# Patient Record
Sex: Female | Born: 1945
Health system: Southern US, Community
[De-identification: ages and names within clinical notes are randomized; demographics above are authoritative.]

## PROBLEM LIST (undated history)

## (undated) DIAGNOSIS — D123 Benign neoplasm of transverse colon: Secondary | ICD-10-CM

## (undated) DIAGNOSIS — I219 Acute myocardial infarction, unspecified: Secondary | ICD-10-CM

## (undated) DIAGNOSIS — D127 Benign neoplasm of rectosigmoid junction: Secondary | ICD-10-CM

## (undated) DIAGNOSIS — F17210 Nicotine dependence, cigarettes, uncomplicated: Secondary | ICD-10-CM

## (undated) DIAGNOSIS — Q600 Renal agenesis, unilateral: Secondary | ICD-10-CM

## (undated) DIAGNOSIS — J410 Simple chronic bronchitis: Secondary | ICD-10-CM

## (undated) DIAGNOSIS — J439 Emphysema, unspecified: Secondary | ICD-10-CM

## (undated) DIAGNOSIS — Z9221 Personal history of antineoplastic chemotherapy: Secondary | ICD-10-CM

## (undated) DIAGNOSIS — R42 Dizziness and giddiness: Secondary | ICD-10-CM

## (undated) DIAGNOSIS — M5136 Other intervertebral disc degeneration, lumbar region: Secondary | ICD-10-CM

## (undated) DIAGNOSIS — C801 Malignant (primary) neoplasm, unspecified: Secondary | ICD-10-CM

## (undated) DIAGNOSIS — C189 Malignant neoplasm of colon, unspecified: Secondary | ICD-10-CM

## (undated) DIAGNOSIS — G971 Other reaction to spinal and lumbar puncture: Secondary | ICD-10-CM

## (undated) DIAGNOSIS — J45909 Unspecified asthma, uncomplicated: Secondary | ICD-10-CM

## (undated) DIAGNOSIS — M51369 Other intervertebral disc degeneration, lumbar region without mention of lumbar back pain or lower extremity pain: Secondary | ICD-10-CM

## (undated) DIAGNOSIS — I10 Essential (primary) hypertension: Secondary | ICD-10-CM

## (undated) DIAGNOSIS — IMO0001 Reserved for inherently not codable concepts without codable children: Secondary | ICD-10-CM

## (undated) DIAGNOSIS — F40240 Claustrophobia: Secondary | ICD-10-CM

## (undated) DIAGNOSIS — J449 Chronic obstructive pulmonary disease, unspecified: Secondary | ICD-10-CM

## (undated) DIAGNOSIS — I2510 Atherosclerotic heart disease of native coronary artery without angina pectoris: Secondary | ICD-10-CM

## (undated) DIAGNOSIS — K625 Hemorrhage of anus and rectum: Secondary | ICD-10-CM

## (undated) DIAGNOSIS — M79603 Pain in arm, unspecified: Secondary | ICD-10-CM

## (undated) DIAGNOSIS — B359 Dermatophytosis, unspecified: Secondary | ICD-10-CM

## (undated) DIAGNOSIS — Z86718 Personal history of other venous thrombosis and embolism: Secondary | ICD-10-CM

## (undated) DIAGNOSIS — Z9189 Other specified personal risk factors, not elsewhere classified: Secondary | ICD-10-CM

## (undated) DIAGNOSIS — E785 Hyperlipidemia, unspecified: Secondary | ICD-10-CM

## (undated) DIAGNOSIS — K649 Unspecified hemorrhoids: Secondary | ICD-10-CM

## (undated) DIAGNOSIS — Z972 Presence of dental prosthetic device (complete) (partial): Secondary | ICD-10-CM

## (undated) DIAGNOSIS — I700 Atherosclerosis of aorta: Secondary | ICD-10-CM

## (undated) DIAGNOSIS — Z923 Personal history of irradiation: Secondary | ICD-10-CM

## (undated) HISTORY — DX: Hyperlipidemia, unspecified: E78.5

## (undated) HISTORY — DX: Atherosclerotic heart disease of native coronary artery without angina pectoris: I25.10

## (undated) HISTORY — DX: Unspecified hemorrhoids: K64.9

## (undated) HISTORY — DX: Other intervertebral disc degeneration, lumbar region without mention of lumbar back pain or lower extremity pain: M51.369

## (undated) HISTORY — DX: Benign neoplasm of transverse colon: D12.3

## (undated) HISTORY — DX: Essential (primary) hypertension: I10

## (undated) HISTORY — PX: VEIN LIGATION AND STRIPPING: SHX2653

## (undated) HISTORY — DX: Chronic obstructive pulmonary disease, unspecified: J44.9

## (undated) HISTORY — PX: LESION REMOVAL: SHX5196

## (undated) HISTORY — DX: Dermatophytosis, unspecified: B35.9

## (undated) HISTORY — PX: BREAST BIOPSY: SHX20

## (undated) HISTORY — DX: Nicotine dependence, cigarettes, uncomplicated: F17.210

## (undated) HISTORY — PX: OVARIAN CYST REMOVAL: SHX89

## (undated) HISTORY — DX: Other specified personal risk factors, not elsewhere classified: Z91.89

## (undated) HISTORY — DX: Other intervertebral disc degeneration, lumbar region: M51.36

## (undated) HISTORY — DX: Hemorrhage of anus and rectum: K62.5

## (undated) HISTORY — PX: EXPLORATORY LAPAROTOMY: SUR591

## (undated) HISTORY — PX: APPENDECTOMY: SHX54

## (undated) HISTORY — DX: Pain in arm, unspecified: M79.603

## (undated) HISTORY — DX: Claustrophobia: F40.240

## (undated) HISTORY — DX: Atherosclerosis of aorta: I70.0

## (undated) HISTORY — DX: Benign neoplasm of rectosigmoid junction: D12.7

## (undated) HISTORY — DX: Emphysema, unspecified: J43.9

---

## 1992-09-20 DIAGNOSIS — Z789 Other specified health status: Secondary | ICD-10-CM

## 1992-09-20 HISTORY — DX: Other specified health status: Z78.9

## 1995-09-21 HISTORY — PX: LUMBAR DISC SURGERY: SHX700

## 2002-02-04 ENCOUNTER — Emergency Department (HOSPITAL_COMMUNITY): Admission: EM | Admit: 2002-02-04 | Discharge: 2002-02-05 | Payer: Self-pay | Admitting: *Deleted

## 2006-05-23 ENCOUNTER — Emergency Department: Payer: Self-pay

## 2006-12-21 ENCOUNTER — Ambulatory Visit: Payer: Self-pay | Admitting: Internal Medicine

## 2007-04-28 ENCOUNTER — Other Ambulatory Visit: Payer: Self-pay

## 2007-04-28 ENCOUNTER — Emergency Department: Payer: Self-pay | Admitting: Emergency Medicine

## 2010-07-22 ENCOUNTER — Ambulatory Visit: Payer: Self-pay | Admitting: Internal Medicine

## 2010-07-30 ENCOUNTER — Ambulatory Visit: Payer: Self-pay | Admitting: Internal Medicine

## 2012-02-23 ENCOUNTER — Ambulatory Visit: Payer: Self-pay | Admitting: Internal Medicine

## 2014-08-20 LAB — HM MAMMOGRAPHY: HM MAMMO: NORMAL

## 2014-08-28 LAB — LIPID PANEL
CHOLESTEROL: 219 mg/dL — AB (ref 0–200)
HDL: 64 mg/dL (ref 35–70)
LDL Cholesterol: 128 mg/dL
Triglycerides: 116 mg/dL (ref 40–160)

## 2014-09-05 ENCOUNTER — Ambulatory Visit: Payer: Self-pay | Admitting: Internal Medicine

## 2014-12-30 ENCOUNTER — Encounter: Payer: Self-pay | Admitting: Internal Medicine

## 2014-12-30 DIAGNOSIS — I2510 Atherosclerotic heart disease of native coronary artery without angina pectoris: Secondary | ICD-10-CM | POA: Insufficient documentation

## 2014-12-30 DIAGNOSIS — M5136 Other intervertebral disc degeneration, lumbar region: Secondary | ICD-10-CM | POA: Insufficient documentation

## 2014-12-30 DIAGNOSIS — E785 Hyperlipidemia, unspecified: Secondary | ICD-10-CM | POA: Insufficient documentation

## 2014-12-30 DIAGNOSIS — Z79899 Other long term (current) drug therapy: Secondary | ICD-10-CM | POA: Insufficient documentation

## 2014-12-30 DIAGNOSIS — I700 Atherosclerosis of aorta: Secondary | ICD-10-CM | POA: Insufficient documentation

## 2014-12-30 DIAGNOSIS — I10 Essential (primary) hypertension: Secondary | ICD-10-CM | POA: Insufficient documentation

## 2014-12-30 DIAGNOSIS — B359 Dermatophytosis, unspecified: Secondary | ICD-10-CM | POA: Insufficient documentation

## 2014-12-30 DIAGNOSIS — J439 Emphysema, unspecified: Secondary | ICD-10-CM | POA: Insufficient documentation

## 2014-12-30 DIAGNOSIS — F17200 Nicotine dependence, unspecified, uncomplicated: Secondary | ICD-10-CM | POA: Insufficient documentation

## 2015-02-28 ENCOUNTER — Ambulatory Visit: Payer: Self-pay | Admitting: Internal Medicine

## 2015-02-28 ENCOUNTER — Ambulatory Visit (INDEPENDENT_AMBULATORY_CARE_PROVIDER_SITE_OTHER): Payer: Medicare PPO | Admitting: Internal Medicine

## 2015-02-28 ENCOUNTER — Encounter: Payer: Self-pay | Admitting: Internal Medicine

## 2015-02-28 VITALS — BP 140/86 | HR 96 | Ht 62.0 in | Wt 174.8 lb

## 2015-02-28 DIAGNOSIS — Z79899 Other long term (current) drug therapy: Secondary | ICD-10-CM | POA: Diagnosis not present

## 2015-02-28 DIAGNOSIS — E785 Hyperlipidemia, unspecified: Secondary | ICD-10-CM | POA: Diagnosis not present

## 2015-02-28 DIAGNOSIS — I10 Essential (primary) hypertension: Secondary | ICD-10-CM | POA: Diagnosis not present

## 2015-02-28 MED ORDER — AMLODIPINE BESYLATE 5 MG PO TABS: 5.0000 mg | ORAL_TABLET | Freq: Every day | ORAL | Status: DC

## 2015-02-28 MED ORDER — ATORVASTATIN CALCIUM 20 MG PO TABS: 20.0000 mg | ORAL_TABLET | Freq: Every day | ORAL | Status: DC

## 2015-02-28 NOTE — Progress Notes (Signed)
Date:  02/28/2015   Name:  Carol Gay   DOB:  1946/02/05   MRN:  [id number]   Chief Complaint: Hyperlipidemia and Hypertension   History of Present Illness:  This is a 69 y.o. female who is presenting for follow up. Hyperlipidemia This is a chronic problem. The current episode started more than 1 month ago. Recent lipid tests were reviewed and are high. She has no history of diabetes. There are no known factors aggravating her hyperlipidemia. Pertinent negatives include no chest pain, leg pain or myalgias. Current antihyperlipidemic treatment includes statins (started last visit). There are no compliance problems.  Risk factors for coronary artery disease include dyslipidemia, hypertension and a sedentary lifestyle.  Hypertension This is a chronic problem. The current episode started more than 1 year ago. The problem has been gradually improving since onset. Pertinent negatives include no chest pain, headaches or neck pain. There are no associated agents to hypertension. Past treatments include calcium channel blockers. The current treatment provides moderate improvement. Compliance problems: no side effects but ran out of meds one month ago.      Review of Systems:  Review of Systems  Constitutional: Positive for fatigue. Negative for fever and unexpected weight change.  Cardiovascular: Negative for chest pain and leg swelling.  Gastrointestinal: Negative for constipation.  Musculoskeletal: Positive for arthralgias (left ant rib). Negative for myalgias and neck pain.  Neurological: Negative for light-headedness and headaches.    Patient Active Problem List   Diagnosis Date Noted  . Encounter for long-term (current) use of medications 02/28/2015  . Aortic atherosclerosis 12/30/2014  . CAD in native artery 12/30/2014  . DDD (degenerative disc disease), lumbar 12/30/2014  . Dyslipidemia 12/30/2014  . Polypharmacy 12/30/2014  . Essential (primary) hypertension 12/30/2014  .  Chronic obstructive pulmonary emphysema 12/30/2014  . Dermatophytoses 12/30/2014  . Compulsive tobacco user syndrome 12/30/2014    Prior to Admission medications   Medication Sig Start Date End Date Taking? Authorizing Provider  amLODipine (NORVASC) 5 MG tablet Take 1 tablet by mouth daily. 08/28/14  Yes Historical Provider, MD  atorvastatin (LIPITOR) 20 MG tablet Take 1 tablet by mouth at bedtime. 08/30/14  Yes Historical Provider, MD  nystatin cream (MYCOSTATIN) Apply 1 application topically 2 (two) times daily as needed. 08/28/14  Yes Historical Provider, MD  Umeclidinium-Vilanterol 62.5-25 MCG/INH AEPB Inhale 1 puff into the lungs daily. 09/30/14  Yes Historical Provider, MD  buPROPion (WELLBUTRIN XL) 150 MG 24 hr tablet Take 1 tablet by mouth daily. 08/28/14   Historical Provider, MD    Allergies  Allergen Reactions  . Aspirin Nausea And Vomiting  . Codeine Nausea And Vomiting    Past Surgical History  Procedure Laterality Date  . Vein ligation and stripping    . Ovarian cyst removal Left   . Appendectomy    . Lumbar disc surgery  1997  . Breast biopsy      History  Substance Use Topics  . Smoking status: Current Every Day Smoker -- 1.00 packs/day for 50 years    Types: Cigarettes  . Smokeless tobacco: Not on file     Comment: not quite ready  . Alcohol Use: No     Medication list has been reviewed and updated.  Physical Examination:  Physical Exam  Constitutional: She is oriented to person, place, and time. She appears well-developed. No distress.  HENT:  Head: Normocephalic and atraumatic.  Eyes: Conjunctivae are normal. Right eye exhibits no discharge. Left eye exhibits no discharge. No scleral icterus.  Neck: Normal range of motion. No thyromegaly present.  Cardiovascular: Normal rate, regular rhythm and intact distal pulses.   Pulmonary/Chest: Effort normal. She has decreased breath sounds.  Abdominal: Soft. Bowel sounds are normal. There is tenderness (over  left ant medial ribs at sternal border).  Musculoskeletal: Normal range of motion.  Neurological: She is alert and oriented to person, place, and time.  Skin: Skin is warm and dry. No rash noted.  Psychiatric: She has a normal mood and affect. Her behavior is normal. Thought content normal.    BP 132/78 mmHg  Pulse 96  Ht '5\' 2"'$  (1.575 m)  Wt 174 lb 12.8 oz (79.289 kg)  BMI 31.96 kg/m2  Assessment and Plan: 1. Essential (primary) hypertension Resume amlodipine daily - Comprehensive metabolic panel - amLODipine (NORVASC) 5 MG tablet; Take 1 tablet (5 mg total) by mouth daily.  Dispense: 30 tablet; Refill: 5  2. Dyslipidemia Continue medication; check CMP - atorvastatin (LIPITOR) 20 MG tablet; Take 1 tablet (20 mg total) by mouth at bedtime.  Dispense: 30 tablet; Refill: 5  3. Encounter for long-term (current) use of medications On statin therapy - Comprehensive metabolic panel   Halina Maidens, MD [facility]  02/28/2015

## 2015-03-01 LAB — COMPREHENSIVE METABOLIC PANEL
A/G RATIO: 1.3 (ref 1.1–2.5)
ALBUMIN: 4.4 g/dL (ref 3.6–4.8)
ALT: 17 IU/L (ref 0–32)
AST: 19 IU/L (ref 0–40)
Alkaline Phosphatase: 91 IU/L (ref 39–117)
BUN/Creatinine Ratio: 22 (ref 11–26)
BUN: 20 mg/dL (ref 8–27)
Bilirubin Total: 0.3 mg/dL (ref 0.0–1.2)
CALCIUM: 10.6 mg/dL — AB (ref 8.7–10.3)
CO2: 23 mmol/L (ref 18–29)
Chloride: 99 mmol/L (ref 97–108)
Creatinine, Ser: 0.89 mg/dL (ref 0.57–1.00)
GFR calc Af Amer: 76 mL/min/{1.73_m2} (ref >59)
GFR calc non Af Amer: 66 mL/min/{1.73_m2} (ref >59)
GLUCOSE: 94 mg/dL (ref 65–99)
Globulin, Total: 3.5 g/dL (ref 1.5–4.5)
Potassium: 5 mmol/L (ref 3.5–5.2)
Sodium: 141 mmol/L (ref 134–144)
TOTAL PROTEIN: 7.9 g/dL (ref 6.0–8.5)

## 2015-05-22 ENCOUNTER — Other Ambulatory Visit: Payer: Self-pay | Admitting: Internal Medicine

## 2015-09-02 ENCOUNTER — Ambulatory Visit (INDEPENDENT_AMBULATORY_CARE_PROVIDER_SITE_OTHER): Payer: Medicare PPO | Admitting: Internal Medicine

## 2015-09-02 ENCOUNTER — Encounter: Payer: Self-pay | Admitting: Internal Medicine

## 2015-09-02 DIAGNOSIS — Z1231 Encounter for screening mammogram for malignant neoplasm of breast: Secondary | ICD-10-CM

## 2015-09-02 DIAGNOSIS — I10 Essential (primary) hypertension: Secondary | ICD-10-CM | POA: Diagnosis not present

## 2015-09-02 DIAGNOSIS — J439 Emphysema, unspecified: Secondary | ICD-10-CM | POA: Diagnosis not present

## 2015-09-02 DIAGNOSIS — F17200 Nicotine dependence, unspecified, uncomplicated: Secondary | ICD-10-CM

## 2015-09-02 DIAGNOSIS — E785 Hyperlipidemia, unspecified: Secondary | ICD-10-CM

## 2015-09-02 DIAGNOSIS — I700 Atherosclerosis of aorta: Secondary | ICD-10-CM | POA: Diagnosis not present

## 2015-09-02 DIAGNOSIS — M5136 Other intervertebral disc degeneration, lumbar region: Secondary | ICD-10-CM | POA: Diagnosis not present

## 2015-09-02 DIAGNOSIS — Z1159 Encounter for screening for other viral diseases: Secondary | ICD-10-CM

## 2015-09-02 DIAGNOSIS — Z23 Encounter for immunization: Secondary | ICD-10-CM | POA: Diagnosis not present

## 2015-09-02 LAB — POC URINALYSIS WITH MICROSCOPIC (NON AUTO)MANUAL RESULT
BILIRUBIN UA: NEGATIVE
Bacteria, UA: 0
Crystals: 0
Epithelial cells, urine per micros: 5
GLUCOSE UA: NEGATIVE
KETONES UA: NEGATIVE
LEUKOCYTES UA: NEGATIVE
Mucus, UA: 0
NITRITE UA: NEGATIVE
PH UA: 5
RBC: 0 M/uL — AB (ref 4.04–5.48)
SPEC GRAV UA: 1.02
UROBILINOGEN UA: 0.2
WBC CASTS UA: 0

## 2015-09-02 MED ORDER — ALBUTEROL SULFATE HFA 108 (90 BASE) MCG/ACT IN AERS: 2.0000 | INHALATION_SPRAY | Freq: Four times a day (QID) | RESPIRATORY_TRACT | Status: DC | PRN

## 2015-09-02 NOTE — Patient Instructions (Addendum)
Health Maintenance  Topic Date Due  . Hepatitis C Screening  December 23, 1945  . TETANUS/TDAP  09/01/2025  . COLONOSCOPY  07/28/1995  . ZOSTAVAX  07/27/2005  . DEXA SCAN  07/27/2010  . INFLUENZA VACCINE  04/21/2015  . PNA vac Low Risk Adult (2 of 2 - PPSV23) 10/30/2015  . MAMMOGRAM  08/20/2016   Tdap Vaccine (Tetanus, Diphtheria and  Pertussis): What You Need to Know 1. Why get vaccinated? Tetanus, diphtheria and pertussis are very serious diseases. Tdap vaccine can protect [place] from these diseases. And, Tdap vaccine given to pregnant women can protect newborn babies against pertussis. TETANUS (Lockjaw) is rare in the [place] today. It causes painful muscle tightening and stiffness, usually all over the body.  It can lead to tightening of muscles in the head and neck so you can't open your mouth, swallow, or sometimes even breathe. Tetanus kills about 1 out of 10 people who are infected even after receiving the best medical care. DIPHTHERIA is also rare in the [place] today. It can cause a thick coating to form in the back of the throat.  It can lead to breathing problems, heart failure, paralysis, and death. PERTUSSIS (Whooping Cough) causes severe coughing spells, which can cause difficulty breathing, vomiting and disturbed sleep.  It can also lead to weight loss, incontinence, and rib fractures. Up to 2 in 100 adolescents and 5 in 100 adults with pertussis are hospitalized or have complications, which could include pneumonia or death. These diseases are caused by bacteria. Diphtheria and pertussis are spread from person to person through secretions from coughing or sneezing. Tetanus enters the body through cuts, scratches, or wounds. Before vaccines, as many as 200,000 cases of diphtheria, 200,000 cases of pertussis, and hundreds of cases of tetanus, were reported in the [place] each year. Since vaccination began, reports of cases for tetanus and diphtheria have dropped by about  99% and for pertussis by about 80%. 2. Tdap vaccine Tdap vaccine can protect adolescents and adults from tetanus, diphtheria, and pertussis. One dose of Tdap is routinely given at age 47 or 55. People who did not get Tdap at that age should get it as soon as possible. Tdap is especially important for healthcare professionals and anyone having close contact with a baby younger than 12 months. Pregnant women should get a dose of Tdap during every pregnancy, to protect the newborn from pertussis. Infants are most at risk for severe, life-threatening complications from pertussis. Another vaccine, called Td, protects against tetanus and diphtheria, but not pertussis. A Td booster should be given every 10 years. Tdap may be given as one of these boosters if you have never gotten Tdap before. Tdap may also be given after a severe cut or burn to prevent tetanus infection. Your doctor or the person giving you the vaccine can give you more information. Tdap may safely be given at the same time as other vaccines. 3. Some people should not get this vaccine  A person who has ever had a life-threatening allergic reaction after a previous dose of any diphtheria, tetanus or pertussis containing vaccine, OR has a severe allergy to any part of this vaccine, should not get Tdap vaccine. Tell the person giving the vaccine about any severe allergies.  Anyone who had coma or long repeated seizures within 7 days after a childhood dose of DTP or DTaP, or a previous dose of Tdap, should not get Tdap, unless a cause other than the vaccine was found. They can  still get Td.  Talk to your doctor if you:  have seizures or another nervous system problem,  had severe pain or swelling after any vaccine containing diphtheria, tetanus or pertussis,  ever had a condition called Guillain-Barr Syndrome (GBS),  aren't feeling well on the day the shot is scheduled. 4. Risks With any medicine, including vaccines, there is a chance  of side effects. These are usually mild and go away on their own. Serious reactions are also possible but are rare. Most people who get Tdap vaccine do not have any problems with it. Mild problems following Tdap (Did not interfere with activities)  Pain where the shot was given (about 3 in 4 adolescents or 2 in 3 adults)  Redness or swelling where the shot was given (about 1 person in 5)  Mild fever of at least 100.81F (up to about 1 in 25 adolescents or 1 in 100 adults)  Headache (about 3 or 4 people in 10)  Tiredness (about 1 person in 3 or 4)  Nausea, vomiting, diarrhea, stomach ache (up to 1 in 4 adolescents or 1 in 10 adults)  Chills, sore joints (about 1 person in 10)  Body aches (about 1 person in 3 or 4)  Rash, swollen glands (uncommon) Moderate problems following Tdap (Interfered with activities, but did not require medical attention)  Pain where the shot was given (up to 1 in 5 or 6)  Redness or swelling where the shot was given (up to about 1 in 16 adolescents or 1 in 12 adults)  Fever over 102F (about 1 in 100 adolescents or 1 in 250 adults)  Headache (about 1 in 7 adolescents or 1 in 10 adults)  Nausea, vomiting, diarrhea, stomach ache (up to 1 or 3 people in 100)  Swelling of the entire arm where the shot was given (up to about 1 in 500). Severe problems following Tdap (Unable to perform usual activities; required medical attention)  Swelling, severe pain, bleeding and redness in the arm where the shot was given (rare). Problems that could happen after any vaccine:  People sometimes faint after a medical procedure, including vaccination. Sitting or lying down for about 15 minutes can help prevent fainting, and injuries caused by a fall. Tell your doctor if you feel dizzy, or have vision changes or ringing in the ears.  Some people get severe pain in the shoulder and have difficulty moving the arm where a shot was given. This happens very rarely.  Any  medication can cause a severe allergic reaction. Such reactions from a vaccine are very rare, estimated at fewer than 1 in a million doses, and would happen within a few minutes to a few hours after the vaccination. As with any medicine, there is a very remote chance of a vaccine causing a serious injury or death. The safety of vaccines is always being monitored. For more information, visit: http://www.aguilar.org/ 5. What if there is a serious problem? What should I look for?  Look for anything that concerns you, such as signs of a severe allergic reaction, very high fever, or unusual behavior.  Signs of a severe allergic reaction can include hives, swelling of the face and throat, difficulty breathing, a fast heartbeat, dizziness, and weakness. These would usually start a few minutes to a few hours after the vaccination. What should I do?  If you think it is a severe allergic reaction or other emergency that can't wait, call 9-1-1 or get the person to the nearest hospital. Otherwise,  call your doctor.  Afterward, the reaction should be reported to the Vaccine Adverse Event Reporting System (VAERS). Your doctor might file this report, or you can do it yourself through the VAERS web site at www.vaers.SamedayNews.es, or by calling 406-529-8280. VAERS does not give medical advice.  6. The National Vaccine Injury Compensation Program The [employer] Vaccine Injury Compensation Program (VICP) is a federal program that was created to compensate people who may have been injured by certain vaccines. Persons who believe they may have been injured by a vaccine can learn about the program and about filing a claim by calling 6058851343 or visiting the [employer] website at GoldCloset.com.ee. There is a time limit to file a claim for compensation. 7. How can I learn more?  Ask your doctor. He or she can give you the vaccine package insert or suggest other sources of information.  Call your local or  state health department.  Contact the Centers for Disease Control and Prevention (CDC):  Call 7724912457 (1-800-CDC-INFO) or  Visit CDC's website at http://hunter.com/ CDC Tdap Vaccine VIS (11/13/13)   This information is not intended to replace advice given to you by your health care provider. Make sure you discuss any questions you have with your health care provider.   Document Released: 03/07/2012 Document Revised: 09/27/2014 Document Reviewed: 12/19/2013 Elsevier Interactive Patient Education [employer].

## 2015-09-02 NOTE — Progress Notes (Signed)
Patient: Carol Gay, Female    DOB: 07/19/1946, 69 y.o.   MRN: [id number] Visit Date: 09/02/2015  Today's Provider: Halina Maidens, MD   Chief Complaint  Patient presents with  . Medicare Wellness  . COPD  . Hypertension   Subjective:    Annual wellness visit Carol Gay is a 69 y.o. female who presents today for her Subsequent Annual Wellness Visit. She feels fairly well. She reports exercising none due to SOB from COPD . She reports she is sleeping fairly well. She denies breast problems, mammogram was done in March.  ----------------------------------------------------------- Hypertension This is a chronic problem. The current episode started more than 1 year ago. The problem is unchanged. The problem is controlled. Associated symptoms include shortness of breath. Pertinent negatives include no chest pain or palpitations.  Hyperlipidemia This is a chronic problem. The current episode started more than 1 year ago. The problem is controlled. Recent lipid tests were reviewed and are normal. Associated symptoms include shortness of breath. Pertinent negatives include no chest pain, focal weakness, leg pain or myalgias. Current antihyperlipidemic treatment includes statins. The current treatment provides significant improvement of lipids.  COPD - using Anoro daily.  She feels like her breathing is worse.  She can not do much housework without sitting down to catch her breath.  She denies chest pain, wheezing or coughing.  She continues to smoke - 1 ppd. She previously using albuterol inhaler but no longer has one.  Review of Systems  Constitutional: Positive for fatigue. Negative for fever, chills and unexpected weight change.  HENT: Negative for hearing loss, sinus pressure, sore throat, tinnitus, trouble swallowing and voice change.   Eyes: Negative for visual disturbance.  Respiratory: Positive for chest tightness and shortness of breath. Negative for cough, choking and  wheezing.   Cardiovascular: Negative for chest pain, palpitations and leg swelling.  Gastrointestinal: Negative for abdominal pain and constipation.  Genitourinary: Negative for dysuria and hematuria.  Musculoskeletal: Positive for back pain. Negative for myalgias, joint swelling and gait problem.  Skin: Negative for pallor and rash.  Neurological: Negative for focal weakness.  Hematological: Negative for adenopathy.  Psychiatric/Behavioral: Negative for confusion and sleep disturbance. The patient is not nervous/anxious.     Social History   Social History  . Marital Status: Widowed    Spouse Name: N/A  . Number of Children: N/A  . Years of Education: N/A   Occupational History  . Not on file.   Social History Main Topics  . Smoking status: Current Every Day Smoker -- 1.00 packs/day for 50 years    Types: Cigarettes  . Smokeless tobacco: Not on file     Comment: not quite ready  . Alcohol Use: No  . Drug Use: No  . Sexual Activity: Not on file   Other Topics Concern  . Not on file   Social History Narrative    Patient Active Problem List   Diagnosis Date Noted  . Encounter for long-term (current) use of medications 02/28/2015  . Aortic atherosclerosis ([place]) 12/30/2014  . CAD in native artery 12/30/2014  . DDD (degenerative disc disease), lumbar 12/30/2014  . Dyslipidemia 12/30/2014  . Polypharmacy 12/30/2014  . Essential (primary) hypertension 12/30/2014  . Chronic obstructive pulmonary emphysema ([place]) 12/30/2014  . Dermatophytoses 12/30/2014  . Compulsive tobacco user syndrome 12/30/2014    Past Surgical History  Procedure Laterality Date  . Vein ligation and stripping    . Ovarian cyst removal Left   . Appendectomy    .  Lumbar disc surgery  1997  . Breast biopsy      Her family history includes Cancer in her maternal grandmother and mother; Heart disease in her mother.    Previous Medications   AMLODIPINE (NORVASC) 5 MG TABLET    Take 1 tablet (5 mg  total) by mouth daily.   ANORO ELLIPTA 62.5-25 MCG/INH AEPB    INHALE ONE PUFF BY MOUTH ONCE DAILY   ATORVASTATIN (LIPITOR) 20 MG TABLET    Take 1 tablet (20 mg total) by mouth at bedtime.    No care team member to display     Objective:   Vitals: BP 138/60 mmHg  Pulse 84  Ht '5\' 2"'$  (1.575 m)  Wt 172 lb 12.8 oz (78.382 kg)  BMI 31.60 kg/m2  SpO2 94%  Physical Exam  Constitutional: She is oriented to person, place, and time. She appears well-developed and well-nourished. No distress.  HENT:  Head: Normocephalic and atraumatic.  Right Ear: Tympanic membrane and ear canal normal.  Left Ear: Tympanic membrane and ear canal normal.  Nose: Right sinus exhibits no maxillary sinus tenderness. Left sinus exhibits no maxillary sinus tenderness.  Mouth/Throat: Uvula is midline and oropharynx is clear and moist.  Eyes: Conjunctivae and EOM are normal. Right eye exhibits no discharge. Left eye exhibits no discharge. No scleral icterus.  Neck: Normal range of motion. Carotid bruit is not present. No erythema present. No thyromegaly present.  Cardiovascular: Normal rate, regular rhythm, normal heart sounds and normal pulses.   Pulmonary/Chest: Effort normal. No respiratory distress. She has decreased breath sounds. She has no wheezes. She has no rhonchi. Right breast exhibits no mass, no nipple discharge, no skin change and no tenderness. Left breast exhibits no mass, no nipple discharge, no skin change and no tenderness.  Abdominal: Soft. Bowel sounds are normal. There is no hepatosplenomegaly. There is no tenderness. There is no CVA tenderness.  Musculoskeletal: Normal range of motion. She exhibits no edema or tenderness.  Lymphadenopathy:    She has no cervical adenopathy.    She has no axillary adenopathy.  Neurological: She is alert and oriented to person, place, and time. She has normal reflexes. No cranial nerve deficit or sensory deficit.  Skin: Skin is warm, dry and intact. No rash noted.   Psychiatric: She has a normal mood and affect. Her speech is normal and behavior is normal. Thought content normal. Cognition and memory are normal.  Nursing note and vitals reviewed.   Activities of Daily Living In your present state of health, do you have any difficulty performing the following activities: 02/28/2015  Hearing? N  Vision? Y  Difficulty concentrating or making decisions? N  Walking or climbing stairs? Y  Dressing or bathing? N  Doing errands, shopping? N    Fall Risk Assessment Fall Risk  09/02/2015  Falls in the past year? No     Patient reports there are not safety devices in place in shower at home.   Depression Screen PHQ 2/9 Scores 09/02/2015  PHQ - 2 Score 6  PHQ- 9 Score 10    Cognitive Testing - 6-CIT   Correct? Score   What year is it? yes 0 Yes = 0    No = 4  What month is it? yes 0 Yes = 0    No = 3  Remember:     [address], [place], [place]     What time is it? yes 0 Yes = 0    No =  3  Count backwards from 20 to 1 yes 0 Correct = 0    1 error = 2   More than 1 error = 4  Say the months of the year in reverse. yes 0 Correct = 0    1 error = 2   More than 1 error = 4  What address did I ask you to remember? yes 0 Correct = 0  1 error = 2    2 error = 4    3 error = 6    4 error = 8    All wrong = 10       TOTAL SCORE  0/28   Interpretation:  Normal  Normal (0-7) Abnormal (8-28)        Assessment & Plan:     Annual Wellness Visit  Reviewed patient's Family Medical History Reviewed and updated list of patient's medical providers Assessment of cognitive impairment was done Assessed patient's functional ability Established a written schedule for health screening [place] Completed and Reviewed  Exercise Activities and Dietary recommendations Goals    None      Immunization History  Administered Date(s) Administered  . Pneumococcal Conjugate-13 10/29/2014  . Pneumococcal Polysaccharide-23  07/22/2010    Health Maintenance  Topic Date Due  . COLONOSCOPY  07/28/1995  . DEXA SCAN  07/27/2010  . INFLUENZA VACCINE  09/22/2023 (Originally 04/21/2015)  . ZOSTAVAX  09/22/2023 (Originally 07/27/2005)  . PNA vac Low Risk Adult (2 of 2 - PPSV23) 10/30/2015  . MAMMOGRAM  08/20/2016  . TETANUS/TDAP  09/01/2025  . Hepatitis C Screening  Addressed     Discussed health benefits of physical activity, and encouraged her to engage in regular exercise appropriate for her age and condition.    ------------------------------------------------------------------------------------------------------------ 1. Aortic atherosclerosis (HCC) Shortness of breath likely due to COPD rather than angina Patient will return for further evaluation if symptoms worsen Unable to take aspirin due to an allergy  2. Essential (primary) hypertension Controlled - CBC with Differential/Platelet - Comprehensive metabolic panel - TSH - POC urinalysis w microscopic (non auto)  3. Pulmonary emphysema, unspecified emphysema type ([place]) Continue current inhaler and add albuterol as needed - albuterol (PROVENTIL HFA;VENTOLIN HFA) 108 (90 BASE) MCG/ACT inhaler; Inhale 2 puffs into the lungs every 6 (six) hours as needed for wheezing or shortness of breath.  Dispense: 1 Inhaler; Refill: 5  4. Dyslipidemia Controlled with atorvastatin - Lipid panel  5. Compulsive tobacco user syndrome Patient continues to struggle with smoking cessation due to recent family stresses  6. DDD (degenerative disc disease), lumbar Mild chronic symptoms  7. Encounter for screening mammogram for breast cancer - MM DIGITAL SCREENING BILATERAL; Future  8. Need for Tdap vaccination - Tdap vaccine greater than or equal to 7yo IM  9. Need for hepatitis C screening test - Hepatitis C antibody   Halina Maidens, MD [facility]  09/02/2015

## 2015-09-03 LAB — CBC WITH DIFFERENTIAL/PLATELET
BASOS ABS: 0.1 10*3/uL (ref 0.0–0.2)
Basos: 1 %
EOS (ABSOLUTE): 0.4 10*3/uL (ref 0.0–0.4)
Eos: 6 %
HEMOGLOBIN: 15.1 g/dL (ref 11.1–15.9)
Hematocrit: 46.4 % (ref 34.0–46.6)
IMMATURE GRANULOCYTES: 0 %
Immature Grans (Abs): 0 10*3/uL (ref 0.0–0.1)
LYMPHS ABS: 1.9 10*3/uL (ref 0.7–3.1)
Lymphs: 27 %
MCH: 29.6 pg (ref 26.6–33.0)
MCHC: 32.5 g/dL (ref 31.5–35.7)
MCV: 91 fL (ref 79–97)
MONOCYTES: 6 %
MONOS ABS: 0.4 10*3/uL (ref 0.1–0.9)
NEUTROS PCT: 60 %
Neutrophils Absolute: 4.2 10*3/uL (ref 1.4–7.0)
Platelets: 272 10*3/uL (ref 150–379)
RBC: 5.1 x10E6/uL (ref 3.77–5.28)
RDW: 14.2 % (ref 12.3–15.4)
WBC: 7 10*3/uL (ref 3.4–10.8)

## 2015-09-03 LAB — COMPREHENSIVE METABOLIC PANEL
ALK PHOS: 92 IU/L (ref 39–117)
ALT: 14 IU/L (ref 0–32)
AST: 18 IU/L (ref 0–40)
Albumin/Globulin Ratio: 1.4 (ref 1.1–2.5)
Albumin: 4.3 g/dL (ref 3.5–4.8)
BUN/Creatinine Ratio: 17 (ref 11–26)
BUN: 11 mg/dL (ref 8–27)
Bilirubin Total: 0.4 mg/dL (ref 0.0–1.2)
CO2: 25 mmol/L (ref 18–29)
CREATININE: 0.66 mg/dL (ref 0.57–1.00)
Calcium: 10.1 mg/dL (ref 8.7–10.3)
Chloride: 99 mmol/L (ref 96–106)
GFR calc Af Amer: 104 mL/min/{1.73_m2} (ref >59)
GFR calc non Af Amer: 90 mL/min/{1.73_m2} (ref >59)
GLUCOSE: 86 mg/dL (ref 65–99)
Globulin, Total: 3 g/dL (ref 1.5–4.5)
Potassium: 4.8 mmol/L (ref 3.5–5.2)
Sodium: 143 mmol/L (ref 134–144)
Total Protein: 7.3 g/dL (ref 6.0–8.5)

## 2015-09-03 LAB — LIPID PANEL
CHOLESTEROL TOTAL: 148 mg/dL (ref 100–199)
Chol/HDL Ratio: 2.2 ratio units (ref 0.0–4.4)
HDL: 68 mg/dL (ref >39)
LDL CALC: 60 mg/dL (ref 0–99)
TRIGLYCERIDES: 98 mg/dL (ref 0–149)
VLDL CHOLESTEROL CAL: 20 mg/dL (ref 5–40)

## 2015-09-03 LAB — HEPATITIS C ANTIBODY: Hep C Virus Ab: <0.1 s/co ratio (ref 0.0–0.9)

## 2015-09-03 LAB — TSH: TSH: 4.09 u[IU]/mL (ref 0.450–4.500)

## 2015-09-19 ENCOUNTER — Other Ambulatory Visit: Payer: Self-pay | Admitting: Internal Medicine

## 2015-09-19 DIAGNOSIS — I10 Essential (primary) hypertension: Secondary | ICD-10-CM

## 2015-09-19 DIAGNOSIS — J439 Emphysema, unspecified: Secondary | ICD-10-CM

## 2015-09-19 DIAGNOSIS — E785 Hyperlipidemia, unspecified: Secondary | ICD-10-CM

## 2015-09-19 MED ORDER — ALBUTEROL SULFATE HFA 108 (90 BASE) MCG/ACT IN AERS: 2.0000 | INHALATION_SPRAY | Freq: Four times a day (QID) | RESPIRATORY_TRACT | Status: DC | PRN

## 2015-09-19 MED ORDER — AMLODIPINE BESYLATE 5 MG PO TABS: 5.0000 mg | ORAL_TABLET | Freq: Every day | ORAL | Status: DC

## 2015-09-19 MED ORDER — ATORVASTATIN CALCIUM 20 MG PO TABS: 20.0000 mg | ORAL_TABLET | Freq: Every day | ORAL | Status: DC

## 2015-09-19 MED ORDER — UMECLIDINIUM-VILANTEROL 62.5-25 MCG/INH IN AEPB: 1.0000 | INHALATION_SPRAY | Freq: Every day | RESPIRATORY_TRACT | Status: DC

## 2015-09-21 DIAGNOSIS — C801 Malignant (primary) neoplasm, unspecified: Secondary | ICD-10-CM

## 2015-09-21 HISTORY — DX: Malignant (primary) neoplasm, unspecified: C80.1

## 2015-09-23 ENCOUNTER — Other Ambulatory Visit: Payer: Self-pay

## 2015-09-23 DIAGNOSIS — I10 Essential (primary) hypertension: Secondary | ICD-10-CM

## 2015-09-23 DIAGNOSIS — E785 Hyperlipidemia, unspecified: Secondary | ICD-10-CM

## 2015-09-23 MED ORDER — ATORVASTATIN CALCIUM 20 MG PO TABS: 20.0000 mg | ORAL_TABLET | Freq: Every day | ORAL | Status: DC

## 2015-09-23 MED ORDER — AMLODIPINE BESYLATE 5 MG PO TABS: 5.0000 mg | ORAL_TABLET | Freq: Every day | ORAL | Status: DC

## 2015-09-23 MED ORDER — UMECLIDINIUM-VILANTEROL 62.5-25 MCG/INH IN AEPB: 1.0000 | INHALATION_SPRAY | Freq: Every day | RESPIRATORY_TRACT | Status: DC

## 2015-10-24 ENCOUNTER — Ambulatory Visit
Admission: RE | Admit: 2015-10-24 | Discharge: 2015-10-24 | Disposition: A | Discharge status: Home / Self Care | Payer: Medicare PPO | Source: Ambulatory Visit | Attending: Internal Medicine | Admitting: Internal Medicine

## 2015-10-24 ENCOUNTER — Ambulatory Visit (INDEPENDENT_AMBULATORY_CARE_PROVIDER_SITE_OTHER): Payer: Medicare PPO | Admitting: Internal Medicine

## 2015-10-24 ENCOUNTER — Encounter: Payer: Self-pay | Admitting: Internal Medicine

## 2015-10-24 VITALS — BP 122/68 | HR 76 | Ht 62.0 in | Wt 176.0 lb

## 2015-10-24 DIAGNOSIS — F17200 Nicotine dependence, unspecified, uncomplicated: Secondary | ICD-10-CM

## 2015-10-24 DIAGNOSIS — M25512 Pain in left shoulder: Secondary | ICD-10-CM | POA: Diagnosis present

## 2015-10-24 DIAGNOSIS — K645 Perianal venous thrombosis: Secondary | ICD-10-CM

## 2015-10-24 DIAGNOSIS — J449 Chronic obstructive pulmonary disease, unspecified: Secondary | ICD-10-CM | POA: Diagnosis not present

## 2015-10-24 DIAGNOSIS — J439 Emphysema, unspecified: Secondary | ICD-10-CM

## 2015-10-24 DIAGNOSIS — I10 Essential (primary) hypertension: Secondary | ICD-10-CM | POA: Diagnosis not present

## 2015-10-24 DIAGNOSIS — I700 Atherosclerosis of aorta: Secondary | ICD-10-CM | POA: Insufficient documentation

## 2015-10-24 MED ORDER — MELOXICAM 15 MG PO TABS: 15.0000 mg | ORAL_TABLET | Freq: Every day | ORAL | Status: DC

## 2015-10-24 NOTE — Progress Notes (Signed)
Date:  10/24/2015   Name:  Carol Gay   DOB:  02/14/1946   MRN:  [id number]   Chief Complaint: Arm Pain Arm Pain  The incident occurred more than 1 week ago. There was no injury mechanism. The pain is present in the upper left arm, left elbow, left forearm and left wrist. The quality of the pain is described as aching. The pain radiates to the left neck. The pain has been constant since the incident. Pertinent negatives include no chest pain or numbness. The symptoms are aggravated by movement. She has tried acetaminophen and immobilization for the symptoms. The treatment provided mild relief.  Hypertension This is a chronic problem. The current episode started more than 1 year ago. The problem is unchanged. The problem is controlled. Associated symptoms include shortness of breath. Pertinent negatives include no chest pain, headaches or palpitations.  Rectal Bleeding  The onset was gradual. The problem occurs continuously. The problem has been gradually worsening. The pain is moderate. The stool is described as hard. Associated symptoms include hemorrhoids and rectal pain (lump at the rectum). Pertinent negatives include no fever, no abdominal pain, no chest pain, no headaches, no coughing and no rash.  COPD - patient has cut back on smoking to 11 cigarettes per day.  She continues to use her inhalers daily.  She denies any recent infections or worsening of breathing symptoms.    Review of Systems  Constitutional: Negative for fever, chills, fatigue and unexpected weight change.  Respiratory: Positive for shortness of breath. Negative for cough, chest tightness and wheezing.   Cardiovascular: Negative for chest pain, palpitations and leg swelling.  Gastrointestinal: Positive for constipation, hematochezia, anal bleeding, rectal pain (lump at the rectum) and hemorrhoids. Negative for abdominal pain.  Genitourinary: Negative for dysuria.  Musculoskeletal: Positive for myalgias,  arthralgias and neck stiffness. Negative for joint swelling and gait problem.  Skin: Negative for rash.  Neurological: Negative for dizziness, weakness, light-headedness, numbness and headaches.    Patient Active Problem List   Diagnosis Date Noted  . Encounter for long-term (current) use of medications 02/28/2015  . Aortic atherosclerosis ([place]) 12/30/2014  . CAD in native artery 12/30/2014  . DDD (degenerative disc disease), lumbar 12/30/2014  . Dyslipidemia 12/30/2014  . Polypharmacy 12/30/2014  . Essential (primary) hypertension 12/30/2014  . Chronic obstructive pulmonary emphysema ([place]) 12/30/2014  . Dermatophytoses 12/30/2014  . Compulsive tobacco user syndrome 12/30/2014    Prior to Admission medications   Medication Sig Start Date End Date Taking? Authorizing Provider  albuterol (PROVENTIL HFA;VENTOLIN HFA) 108 (90 Base) MCG/ACT inhaler Inhale 2 puffs into the lungs every 6 (six) hours as needed for wheezing or shortness of breath. 09/19/15  Yes Glean Hess, MD  amLODipine (NORVASC) 5 MG tablet Take 1 tablet (5 mg total) by mouth daily. 09/23/15  Yes Glean Hess, MD  atorvastatin (LIPITOR) 20 MG tablet Take 1 tablet (20 mg total) by mouth at bedtime. 09/23/15  Yes Glean Hess, MD  Umeclidinium-Vilanterol [facility] ELLIPTA) 62.5-25 MCG/INH AEPB Inhale 1 puff into the lungs daily. 09/23/15  Yes Glean Hess, MD    Allergies  Allergen Reactions  . Aspirin Nausea And Vomiting  . Codeine Nausea And Vomiting  . Morphine And Related     Past Surgical History  Procedure Laterality Date  . Vein ligation and stripping    . Ovarian cyst removal Left   . Appendectomy    . Lumbar disc surgery  1997  . Breast  biopsy      Social History  Substance Use Topics  . Smoking status: Current Every Day Smoker -- 1.00 packs/day for 50 years    Types: Cigarettes  . Smokeless tobacco: None     Comment: not quite ready  . Alcohol Use: No   Fall Risk  09/02/2015  Falls in the  past year? No   Depression screen PHQ 2/9 09/02/2015  Decreased Interest 3  Down, Depressed, Hopeless 3  PHQ - 2 Score 6  Altered sleeping 0  Tired, decreased energy 2  Change in appetite 0  Feeling bad or failure about yourself  1  Trouble concentrating 0  Moving slowly or fidgety/restless 0  Suicidal thoughts 1  PHQ-9 Score 10  Difficult doing work/chores Not difficult at all    Medication list has been reviewed and updated.  Physical Exam  Constitutional: She is oriented to person, place, and time. She appears well-developed. No distress.  HENT:  Head: Normocephalic and atraumatic.  Neck: Normal range of motion. Neck supple. No thyromegaly present.    Cardiovascular: Normal rate, regular rhythm and normal heart sounds.   Pulmonary/Chest: Effort normal. No respiratory distress. She has decreased breath sounds.  Genitourinary: Rectal exam shows external hemorrhoid (thrombosed hemorrhoid at 4 oclock) and tenderness.  Musculoskeletal:       Left shoulder: She exhibits decreased range of motion and tenderness.  Tender over anterior left shoulder joint, over left AC joint and over distal biceps. Grips 4+/5 bilaterally  Neurological: She is alert and oriented to person, place, and time. She displays no tremor. No sensory deficit. She exhibits normal muscle tone.  Reflex Scores:      Bicep reflexes are 2+ on the right side and 2+ on the left side. Skin: Skin is warm and dry. No rash noted.  Psychiatric: She has a normal mood and affect. Her behavior is normal. Thought content normal.    BP 122/68 mmHg  Pulse 76  Ht '5\' 2"'$  (1.575 m)  Wt 176 lb (79.833 kg)  BMI 32.18 kg/m2  Assessment and Plan: 1. Shoulder pain, acute, left Suspect OA - DG Shoulder Left; Future - meloxicam (MOBIC) 15 MG tablet; Take 1 tablet (15 mg total) by mouth daily.  Dispense: 30 tablet; Refill: 0  2. Thrombosed external hemorrhoid Continue Prep H and hot bathes - Ambulatory referral to General  Surgery  3. Compulsive tobacco user syndrome Left hilar fullness noted - CXR to rule out pulmonary process - DG Chest 2 View; Future  4. Pulmonary emphysema, unspecified emphysema type ([place]) Doing well to reduce smoking Continue inhalers  5. Essential (primary) hypertension controlled   Halina Maidens, MD [facility]  10/24/2015

## 2015-10-27 ENCOUNTER — Telehealth: Payer: Self-pay

## 2015-10-27 NOTE — Telephone Encounter (Signed)
Spoke with patient. Patient advised of all results and verbalized understanding. Will call back with any future questions or concerns. MAH  

## 2015-10-27 NOTE — Telephone Encounter (Signed)
-----   Message from Glean Hess, MD sent at 10/24/2015  4:16 PM EST ----- CXR shows COPD but no other new abnormality.

## 2015-10-27 NOTE — Telephone Encounter (Signed)
-----   Message from Glean Hess, MD sent at 10/24/2015  4:16 PM EST ----- Osteoarthritis of the shoulder.  No other abnormality.

## 2015-11-03 ENCOUNTER — Encounter: Payer: Self-pay | Admitting: Surgery

## 2015-11-03 ENCOUNTER — Other Ambulatory Visit: Payer: Self-pay

## 2015-11-03 ENCOUNTER — Encounter (INDEPENDENT_AMBULATORY_CARE_PROVIDER_SITE_OTHER): Payer: Self-pay

## 2015-11-03 ENCOUNTER — Encounter: Payer: Self-pay | Admitting: *Deleted

## 2015-11-03 ENCOUNTER — Ambulatory Visit (INDEPENDENT_AMBULATORY_CARE_PROVIDER_SITE_OTHER): Payer: Medicare PPO | Admitting: Surgery

## 2015-11-03 VITALS — BP 178/92 | HR 90 | Temp 98.7°F | Ht 62.0 in | Wt 176.0 lb

## 2015-11-03 DIAGNOSIS — R198 Other specified symptoms and signs involving the digestive system and abdomen: Secondary | ICD-10-CM | POA: Diagnosis not present

## 2015-11-03 DIAGNOSIS — K6289 Other specified diseases of anus and rectum: Secondary | ICD-10-CM

## 2015-11-03 HISTORY — PX: LESION EXCISION: SHX5167

## 2015-11-03 NOTE — Patient Instructions (Signed)
Please do sitz baths twice a day everyday.  Please go to your appointment for your colonoscopy.

## 2015-11-03 NOTE — Progress Notes (Signed)
Surgical Consultation  11/03/2015  Carol Gay is an 70 y.o. female.   Chief Complaint  Patient presents with  . Follow-up    Hemorrhoids and rectal pain   HPI:  70 year old very pleasant female with a history of severe intermittent anorectal pain for the last few months. She reports she feels are hemorrhoids and also repeat intermittent hematochezia. She has used Preparation H H date initially was helping but now she has persistent of symptoms. She has never had a colonoscopy for her past medical history significant for coronary artery disease and severe COPD. She uses inhalers and does not use any oxygen she does have some dyspnea on exertion. She did have a history of hemorrhoid surgery in the past as well. He is a current smoker  Past Medical History  Diagnosis Date  . Cigarette smoker   . Hypertension   . Arm pain   . Rectal bleeding   . COPD (chronic obstructive pulmonary disease) ([place])   . Hemorrhoids   . Aortic atherosclerosis ([place])   . CAD in native artery   . Pulmonary emphysema ([place])   . DDD (degenerative disc disease), lumbar   . Dyslipidemia   . Dermatophytoses     Past Surgical History  Procedure Laterality Date  . Vein ligation and stripping    . Ovarian cyst removal Left   . Appendectomy    . Lumbar disc surgery  1997  . Breast biopsy    . Exploratory laparotomy      Family History  Problem Relation Age of Onset  . Cancer Mother     lymphoma  . Heart disease Mother   . Cancer Maternal Grandmother     breast    Social History:  reports that she has been smoking Cigarettes.  She has a 50 pack-year smoking history. She does not have any smokeless tobacco history on file. She reports that she does not drink alcohol or use illicit drugs.  Allergies:  Allergies  Allergen Reactions  . Aspirin Nausea And Vomiting  . Codeine Nausea And Vomiting  . Morphine And Related     Medications reviewed.     ROS 10 point review of systems performance  otherwise negative other than what is stated in the history of present illness  BP 178/92 mmHg  Pulse 90  Temp(Src) 98.7 F (37.1 C) (Oral)  Ht '5\' 2"'$  (1.575 m)  Wt 79.833 kg (176 lb)  BMI 32.18 kg/m2  Physical Exam No acute distress awake alert Neck: Supple, no masses trachea is midline Chest: Lungs are clear to auscultation bilaterally no wheezes. Cardiac: Normal sinus rhythm, S1 and S2 no murmurs Abdomen: Previous laparotomy scar. Soft nontender no masses Rectal: Evidence of what appears to be thrombosed external hemorrhoid on the left anterolateral aspect evidence of tenderness to palpation examination is limited due to tenderness. There is no definitive masses Extremities: No edema, well-perfused  Assessment/Plan: 1. Rectal mass vs Thrombosed hemorrhoid Discussed with the patient in detail clinically seems more reminiscence of a thrombosed hemorrhoid discussed with the patient in detail and they need for biopsy in case it is an actual malignancy. Scars with the patient about incisional biopsy versus I&D if this is a thrombosed hemorrhoid. Risks, benefits and possible complications explained to the patient in detail. She understands and wishes to proceed  Procedure note After informed consent was obtained, the patient was placed in lateral position and was prepped and draped in the usual sterile fashion. Using 1% lidocaine with epinephrine. We perform  a block around the anus and around the lesion. The lesion looked like a thrombosed hemorrhoid. An I was able to advance it and once I last her diarrhea last this was not there was no clot and it was necrotic material versus complex cystic lesion. Using an 11 blade knife I'll obtain an incisional biopsy and including part of the anal dermis. I was able to perform a bimanual exam and there was significant thickening between the vaginal cuff and rectum.  Discussed with the patient in detail about my concerns for potential anorectal malignancy.  Hemostasis obtained with pressure. We also arrange colonoscopy.   Caroleen Hamman, MD [facility] General Surgeon

## 2015-11-05 NOTE — Discharge Instructions (Signed)

## 2015-11-06 ENCOUNTER — Encounter
Admission: RE | Disposition: A | Discharge status: Home / Self Care | Payer: Self-pay | Source: Ambulatory Visit | Attending: Gastroenterology

## 2015-11-06 ENCOUNTER — Ambulatory Visit
Admission: RE | Admit: 2015-11-06 | Discharge: 2015-11-06 | Disposition: A | Discharge status: Home / Self Care | Payer: Medicare PPO | Source: Ambulatory Visit | Attending: Gastroenterology | Admitting: Gastroenterology

## 2015-11-06 ENCOUNTER — Telehealth: Payer: Self-pay

## 2015-11-06 ENCOUNTER — Other Ambulatory Visit: Payer: Self-pay | Admitting: Surgery

## 2015-11-06 ENCOUNTER — Ambulatory Visit: Payer: Medicare PPO | Admitting: Anesthesiology

## 2015-11-06 DIAGNOSIS — Z807 Family history of other malignant neoplasms of lymphoid, hematopoietic and related tissues: Secondary | ICD-10-CM | POA: Diagnosis not present

## 2015-11-06 DIAGNOSIS — B359 Dermatophytosis, unspecified: Secondary | ICD-10-CM | POA: Insufficient documentation

## 2015-11-06 DIAGNOSIS — K921 Melena: Secondary | ICD-10-CM | POA: Insufficient documentation

## 2015-11-06 DIAGNOSIS — K5730 Diverticulosis of large intestine without perforation or abscess without bleeding: Secondary | ICD-10-CM | POA: Diagnosis not present

## 2015-11-06 DIAGNOSIS — Z803 Family history of malignant neoplasm of breast: Secondary | ICD-10-CM | POA: Insufficient documentation

## 2015-11-06 DIAGNOSIS — K6289 Other specified diseases of anus and rectum: Secondary | ICD-10-CM

## 2015-11-06 DIAGNOSIS — J449 Chronic obstructive pulmonary disease, unspecified: Secondary | ICD-10-CM | POA: Diagnosis not present

## 2015-11-06 DIAGNOSIS — I10 Essential (primary) hypertension: Secondary | ICD-10-CM | POA: Insufficient documentation

## 2015-11-06 DIAGNOSIS — D490 Neoplasm of unspecified behavior of digestive system: Secondary | ICD-10-CM | POA: Insufficient documentation

## 2015-11-06 DIAGNOSIS — R42 Dizziness and giddiness: Secondary | ICD-10-CM | POA: Diagnosis not present

## 2015-11-06 DIAGNOSIS — I252 Old myocardial infarction: Secondary | ICD-10-CM | POA: Diagnosis not present

## 2015-11-06 DIAGNOSIS — Z8249 Family history of ischemic heart disease and other diseases of the circulatory system: Secondary | ICD-10-CM | POA: Diagnosis not present

## 2015-11-06 DIAGNOSIS — Z79899 Other long term (current) drug therapy: Secondary | ICD-10-CM | POA: Diagnosis not present

## 2015-11-06 DIAGNOSIS — D127 Benign neoplasm of rectosigmoid junction: Secondary | ICD-10-CM | POA: Insufficient documentation

## 2015-11-06 DIAGNOSIS — E785 Hyperlipidemia, unspecified: Secondary | ICD-10-CM | POA: Diagnosis not present

## 2015-11-06 DIAGNOSIS — Z905 Acquired absence of kidney: Secondary | ICD-10-CM | POA: Insufficient documentation

## 2015-11-06 DIAGNOSIS — D123 Benign neoplasm of transverse colon: Secondary | ICD-10-CM | POA: Diagnosis not present

## 2015-11-06 DIAGNOSIS — Z86718 Personal history of other venous thrombosis and embolism: Secondary | ICD-10-CM | POA: Diagnosis not present

## 2015-11-06 DIAGNOSIS — K632 Fistula of intestine: Secondary | ICD-10-CM | POA: Diagnosis not present

## 2015-11-06 DIAGNOSIS — F17210 Nicotine dependence, cigarettes, uncomplicated: Secondary | ICD-10-CM | POA: Insufficient documentation

## 2015-11-06 DIAGNOSIS — Z7982 Long term (current) use of aspirin: Secondary | ICD-10-CM | POA: Diagnosis not present

## 2015-11-06 DIAGNOSIS — R0602 Shortness of breath: Secondary | ICD-10-CM | POA: Insufficient documentation

## 2015-11-06 DIAGNOSIS — K604 Rectal fistula: Secondary | ICD-10-CM | POA: Diagnosis not present

## 2015-11-06 DIAGNOSIS — I2510 Atherosclerotic heart disease of native coronary artery without angina pectoris: Secondary | ICD-10-CM | POA: Diagnosis not present

## 2015-11-06 DIAGNOSIS — Z7951 Long term (current) use of inhaled steroids: Secondary | ICD-10-CM | POA: Insufficient documentation

## 2015-11-06 DIAGNOSIS — I700 Atherosclerosis of aorta: Secondary | ICD-10-CM | POA: Insufficient documentation

## 2015-11-06 DIAGNOSIS — C218 Malignant neoplasm of overlapping sites of rectum, anus and anal canal: Secondary | ICD-10-CM | POA: Insufficient documentation

## 2015-11-06 HISTORY — DX: Dizziness and giddiness: R42

## 2015-11-06 HISTORY — DX: Reserved for inherently not codable concepts without codable children: IMO0001

## 2015-11-06 HISTORY — DX: Presence of dental prosthetic device (complete) (partial): Z97.2

## 2015-11-06 HISTORY — PX: POLYPECTOMY: SHX5525

## 2015-11-06 HISTORY — DX: Personal history of other venous thrombosis and embolism: Z86.718

## 2015-11-06 HISTORY — DX: Simple chronic bronchitis: J41.0

## 2015-11-06 HISTORY — DX: Acute myocardial infarction, unspecified: I21.9

## 2015-11-06 HISTORY — PX: COLONOSCOPY WITH PROPOFOL: SHX5780

## 2015-11-06 HISTORY — DX: Renal agenesis, unilateral: Q60.0

## 2015-11-06 SURGERY — COLONOSCOPY WITH PROPOFOL
Anesthesia: Monitor Anesthesia Care | Wound class: Contaminated

## 2015-11-06 MED ORDER — LIDOCAINE HCL (CARDIAC) 20 MG/ML IV SOLN
INTRAVENOUS | Status: DC | PRN
  Administered 2015-11-06: 50 mg via INTRAVENOUS

## 2015-11-06 MED ORDER — LACTATED RINGERS IV SOLN
INTRAVENOUS | Status: DC
  Administered 2015-11-06: 08:00:00 via INTRAVENOUS

## 2015-11-06 MED ORDER — SODIUM CHLORIDE 0.9 % IV SOLN: INTRAVENOUS | Status: DC

## 2015-11-06 MED ORDER — ACETAMINOPHEN 160 MG/5ML PO SOLN: 325.0000 mg | ORAL | Status: DC | PRN

## 2015-11-06 MED ORDER — PROPOFOL 10 MG/ML IV BOLUS
INTRAVENOUS | Status: DC | PRN
  Administered 2015-11-06: 20 mg via INTRAVENOUS
  Administered 2015-11-06: 30 mg via INTRAVENOUS
  Administered 2015-11-06: 20 mg via INTRAVENOUS
  Administered 2015-11-06: 30 mg via INTRAVENOUS
  Administered 2015-11-06 (×2): 20 mg via INTRAVENOUS
  Administered 2015-11-06: 50 mg via INTRAVENOUS

## 2015-11-06 MED ORDER — STERILE WATER FOR IRRIGATION IR SOLN
Status: DC | PRN
  Administered 2015-11-06: 09:00:00

## 2015-11-06 MED ORDER — ACETAMINOPHEN 325 MG PO TABS
325.0000 mg | ORAL_TABLET | ORAL | Status: DC | PRN
  Administered 2015-11-06: 650 mg via ORAL

## 2015-11-06 SURGICAL SUPPLY — 28 items
CANISTER SUCT 1200ML W/VALVE (MISCELLANEOUS) ×4 IMPLANT
FCP ESCP3.2XJMB 240X2.8X (MISCELLANEOUS)
FORCEPS BIOP RAD 4 LRG CAP 4 (CUTTING FORCEPS) IMPLANT
FORCEPS BIOP RJ4 240 W/NDL (MISCELLANEOUS)
FORCEPS ESCP3.2XJMB 240X2.8X (MISCELLANEOUS) IMPLANT
GOWN CVR UNV OPN BCK APRN NK (MISCELLANEOUS) ×4 IMPLANT
GOWN ISOL THUMB LOOP REG UNIV (MISCELLANEOUS) ×8
HEMOCLIP INSTINCT (CLIP) IMPLANT
INJECTOR VARIJECT VIN23 (MISCELLANEOUS) IMPLANT
KIT CO2 TUBING (TUBING) IMPLANT
KIT DEFENDO VALVE AND CONN (KITS) IMPLANT
KIT ENDO PROCEDURE OLY (KITS) ×4 IMPLANT
LIGATOR MULTIBAND 6SHOOTER MBL (MISCELLANEOUS) IMPLANT
MARKER SPOT ENDO TATTOO 5ML (MISCELLANEOUS) IMPLANT
PAD GROUND ADULT SPLIT (MISCELLANEOUS) IMPLANT
SNARE SHORT THROW 13M SML OVAL (MISCELLANEOUS) ×2 IMPLANT
SNARE SHORT THROW 30M LRG OVAL (MISCELLANEOUS) IMPLANT
SPOT EX ENDOSCOPIC TATTOO (MISCELLANEOUS)
SUCTION POLY TRAP 4CHAMBER (MISCELLANEOUS) IMPLANT
TRAP SUCTION POLY (MISCELLANEOUS) ×2 IMPLANT
TUBING CONN 6MMX3.1M (TUBING)
TUBING SUCTION CONN 0.25 STRL (TUBING) IMPLANT
UNDERPAD 30X60 958B10 (PK) (MISCELLANEOUS) IMPLANT
VALVE BIOPSY ENDO (VALVE) IMPLANT
VARIJECT INJECTOR VIN23 (MISCELLANEOUS)
WATER AUXILLARY (MISCELLANEOUS) IMPLANT
WATER STERILE IRR 250ML POUR (IV SOLUTION) ×4 IMPLANT
WATER STERILE IRR 500ML POUR (IV SOLUTION) IMPLANT

## 2015-11-06 NOTE — Anesthesia Procedure Notes (Signed)
Procedure Name: MAC Performed by: Brinda Focht Pre-anesthesia Checklist: Patient identified, Emergency Drugs available, Suction available, Timeout performed and Patient being monitored Patient Re-evaluated:Patient Re-evaluated prior to inductionOxygen Delivery Method: Nasal cannula Placement Confirmation: positive ETCO2       

## 2015-11-06 NOTE — Addendum Note (Signed)
Addended by: Caroleen Hamman F on: 11/06/2015 11:03 AM   Modules accepted: Orders

## 2015-11-06 NOTE — H&P (Signed)
[facility]  [address], [place], [place] [address] Phone: [phone] Fax : [phone]  Primary Care Physician:  Halina Maidens, MD Primary Gastroenterologist:  Dr. Allen Norris  Pre-Procedure History & Physical: HPI:  Carol Gay is a 70 y.o. female is here for an colonoscopy.   Past Medical History  Diagnosis Date  . Cigarette smoker   . Hypertension   . Arm pain   . Rectal bleeding   . COPD (chronic obstructive pulmonary disease) ([place])   . Hemorrhoids   . CAD in native artery   . Pulmonary emphysema ([place])   . Dyslipidemia   . Dermatophytoses   . DDD (degenerative disc disease), lumbar        . Aortic atherosclerosis ([place])   . H/O blood clots     left leg, veins stripped  . Shortness of breath dyspnea   . Smokers' cough ([place])   . Myocardial infarction [facility])     "mild" - age 17  . Congenital absence of one kidney     born with only right kidney  . Vertigo   . Wears dentures     has full upper and lower - doesn't wear    Past Surgical History  Procedure Laterality Date  . Vein ligation and stripping    . Ovarian cyst removal Left   . Appendectomy    . Lumbar disc surgery  1997  . Breast biopsy    . Exploratory laparotomy    . Lesion excision  11/03/15    Anal - Dr Alla Feeling surg    Prior to Admission medications   Medication Sig Start Date End Date Taking? Authorizing Provider  albuterol (PROVENTIL HFA;VENTOLIN HFA) 108 (90 Base) MCG/ACT inhaler Inhale 2 puffs into the lungs every 6 (six) hours as needed for wheezing or shortness of breath. 09/19/15  Yes Glean Hess, MD  amLODipine (NORVASC) 5 MG tablet Take 1 tablet (5 mg total) by mouth daily. 09/23/15  Yes Glean Hess, MD  atorvastatin (LIPITOR) 20 MG tablet Take 1 tablet (20 mg total) by mouth at bedtime. 09/23/15  Yes Glean Hess, MD  meclizine (ANTIVERT) 25 MG tablet Take 25 mg by mouth 3 (three) times daily as needed for dizziness.   Yes Historical Provider, MD    meloxicam (MOBIC) 15 MG tablet Take 1 tablet (15 mg total) by mouth daily. 10/24/15  Yes Glean Hess, MD  Umeclidinium-Vilanterol [facility] ELLIPTA) 62.5-25 MCG/INH AEPB Inhale 1 puff into the lungs daily. 09/23/15  Yes Glean Hess, MD    Allergies as of 11/03/2015 - Review Complete 11/03/2015  Allergen Reaction Noted  . Aspirin Nausea And Vomiting 12/30/2014  . Codeine Nausea And Vomiting 12/30/2014  . Morphine and related Nausea And Vomiting 10/24/2015    Family History  Problem Relation Age of Onset  . Cancer Mother     lymphoma  . Heart disease Mother   . Cancer Maternal Grandmother     breast    Social History   Social History  . Marital Status: Widowed    Spouse Name: N/A  . Number of Children: N/A  . Years of Education: N/A   Occupational History  . Not on file.   Social History Main Topics  . Smoking status: Current Every Day Smoker -- 1.00 packs/day for 57 years    Types: Cigarettes  . Smokeless tobacco: Not on file     Comment: not quite ready  . Alcohol Use: No  . Drug  Use: No  . Sexual Activity: Not on file   Other Topics Concern  . Not on file   Social History Narrative    Review of Systems: See HPI, otherwise negative ROS  Physical Exam: BP 132/72 mmHg  Pulse 99  Temp(Src) 97.9 F (36.6 C) (Temporal)  Resp 20  Ht '5\' 2"'$  (1.575 m)  Wt 170 lb (77.111 kg)  BMI 31.09 kg/m2  SpO2 90% General:   Alert,  pleasant and cooperative in NAD Head:  Normocephalic and atraumatic. Neck:  Supple; no masses or thyromegaly. Lungs:  Clear throughout to auscultation.    Heart:  Regular rate and rhythm. Abdomen:  Soft, nontender and nondistended. Normal bowel sounds, without guarding, and without rebound.   Neurologic:  Alert and  oriented x4;  grossly normal neurologically.  Impression/Plan: Carol Gay is here for an colonoscopy to be performed for hematochezia  Risks, benefits, limitations, and alternatives regarding  colonoscopy have been  reviewed with the patient.  Questions have been answered.  All parties agreeable.   Ollen Bowl, MD  11/06/2015, 7:57 AM

## 2015-11-06 NOTE — Transfer of Care (Signed)
Immediate Anesthesia Transfer of Care Note  Patient: Carol Gay  Procedure(s) Performed: Procedure(s) with comments: COLONOSCOPY WITH PROPOFOL (N/A) - LEAVE PT EARLY POLYPECTOMY  Patient Location: PACU  Anesthesia Type: MAC  Level of Consciousness: awake, alert  and patient cooperative  Airway and Oxygen Therapy: Patient Spontanous Breathing and Patient connected to supplemental oxygen  Post-op Assessment: Post-op Vital signs reviewed, Patient's Cardiovascular Status Stable, Respiratory Function Stable, Patent Airway and No signs of Nausea or vomiting  Post-op Vital Signs: Reviewed and stable  Complications: No apparent anesthesia complications

## 2015-11-06 NOTE — Op Note (Signed)
[facility] Patient Name: Carol Gay Procedure Date: 11/06/2015 8:20 AM MRN: [id number] Account #: [id number] Date of Birth: May 23, 1946 Admit Type: Outpatient Age: 70 Room: [facility] Main OR ROOM 01 Gender: Female Note Status: Finalized Procedure:            Colonoscopy Indications:          Hematochezia Providers:            Lucilla Lame, MD Referring MD:         Halina Maidens, MD (Referring MD) Medicines:            Propofol per Anesthesia Complications:        No immediate complications. Procedure:            Pre-Anesthesia Assessment:                       - Prior to the procedure, a History and Physical was                        performed, and patient medications and allergies were                        reviewed. The patient's tolerance of previous                        anesthesia was also reviewed. The risks and benefits of                        the procedure and the sedation options and risks were                        discussed with the patient. All questions were                        answered, and informed consent was obtained. Prior                        Anticoagulants: The patient has taken no previous                        anticoagulant or antiplatelet agents. ASA Grade                        Assessment: II - A patient with mild systemic disease.                        After reviewing the risks and benefits, the patient was                        deemed in satisfactory condition to undergo the                        procedure.                       After obtaining informed consent, the colonoscope was                        passed under direct vision. Throughout the procedure,  the patient's blood pressure, pulse, and oxygen                        saturations were monitored continuously. The Olympus CF                        H180AL colonoscope (S#: [id number]) was introduced through                        the  anus and advanced to the the cecum, identified by                        appendiceal orifice and ileocecal valve. The                        colonoscopy was performed without difficulty. The                        patient tolerated the procedure well. The quality of                        the bowel preparation was excellent. Findings:      The perianal and digital rectal examinations were normal.      A 4 mm polyp was found in the transverse colon. The polyp was sessile.       The polyp was removed with a cold snare. Resection and retrieval were       complete.      Two sessile polyps were found in the recto-sigmoid colon. The polyps       were 4 to 5 mm in size. These polyps were removed with a cold snare.       Resection and retrieval were complete.      A frond-like/villous non-obstructing large mass was found in the rectum.       The mass was non-circumferential. No bleeding was present. This was       biopsied with a cold forceps for histology.      A fistula was found in the rectum.      Multiple small-mouthed diverticula were found in the sigmoid colon. Impression:           - One 4 mm polyp in the transverse colon, removed with                        a cold snare. Resected and retrieved.                       - Two 4 to 5 mm polyps at the recto-sigmoid colon,                        removed with a cold snare. Resected and retrieved.                       - Tumor in the rectum. Biopsied.                       - Colonic fistula.                       - Diverticulosis in the sigmoid colon. Recommendation:       - Await pathology  results. Procedure Code(s):    --- Professional ---                       [phone], Colonoscopy, flexible; with removal of tumor(s),                        polyp(s), or other lesion(s) by snare technique                       [id number], 3, Colonoscopy, flexible; with biopsy, single                        or multiple Diagnosis Code(s):    --- Professional ---                        K92.1, Melena (includes Hematochezia)                       D12.3, Benign neoplasm of transverse colon (hepatic                        flexure or splenic flexure)                       D12.7, Benign neoplasm of rectosigmoid junction                       D49.0, Neoplasm of unspecified behavior of digestive                        system                       K63.2, Fistula of intestine CPT copyright 2016 American Medical Association. All rights reserved. The codes documented in this report are preliminary and upon coder review may  be revised to meet current compliance requirements. Lucilla Lame, MD 11/06/2015 8:50:26 AM This report has been signed electronically. Number of Addenda: 0 Note Initiated On: 11/06/2015 8:20 AM Scope Withdrawal Time: 0 hours 9 minutes 14 seconds  Total Procedure Duration: 0 hours 11 minutes 12 seconds       [facility]

## 2015-11-06 NOTE — Telephone Encounter (Signed)
Called patient to let her know that she will see Dr. Dahlia Byes on 11/14/2015 at 3:00 PM when he's on days. I also told patient that she will need to have her blood drawn and a CT Scan ordered for her on Tuesday 11/11/2015 at [place] at 3:00 PM. Patient was also told to pick up a prep-kit for her CT Scan and at the same time have her blood drawn when she came in to have this done. Patient agreed.

## 2015-11-06 NOTE — Anesthesia Postprocedure Evaluation (Signed)
Anesthesia Post Note  Patient: Carol Gay  Procedure(s) Performed: Procedure(s) (LRB): COLONOSCOPY WITH PROPOFOL (N/A) POLYPECTOMY  Patient location during evaluation: PACU Anesthesia Type: MAC Level of consciousness: awake and alert and oriented Pain management: satisfactory to patient Vital Signs Assessment: post-procedure vital signs reviewed and stable Respiratory status: spontaneous breathing, nonlabored ventilation and respiratory function stable Cardiovascular status: blood pressure returned to baseline and stable Postop Assessment: Adequate PO intake and No signs of nausea or vomiting Anesthetic complications: no    Raliegh Ip

## 2015-11-06 NOTE — Anesthesia Preprocedure Evaluation (Signed)
Anesthesia Evaluation  Patient identified by MRN, date of birth, ID band  Reviewed: Allergy & Precautions, H&P , NPO status , Patient's Chart, lab work & pertinent test results  Airway Mallampati: II  TM Distance: >3 FB Neck ROM: full    Dental no notable dental hx.    Pulmonary shortness of breath, COPD,  COPD inhaler, Current Smoker,    Pulmonary exam normal        Cardiovascular hypertension, + CAD and + Past MI   Rhythm:regular Rate:Normal     Neuro/Psych    GI/Hepatic   Endo/Other    Renal/GU      Musculoskeletal   Abdominal   Peds  Hematology   Anesthesia Other Findings   Reproductive/Obstetrics                             Anesthesia Physical Anesthesia Plan  ASA: III  Anesthesia Plan: MAC   Post-op Pain Management:    Induction:   Airway Management Planned:   Additional Equipment:   Intra-op Plan:   Post-operative Plan:   Informed Consent: I have reviewed the patients History and Physical, chart, labs and discussed the procedure including the risks, benefits and alternatives for the proposed anesthesia with the patient or authorized representative who has indicated his/her understanding and acceptance.     Plan Discussed with: CRNA  Anesthesia Plan Comments:         Anesthesia Quick Evaluation

## 2015-11-07 ENCOUNTER — Ambulatory Visit: Admission: RE | Admit: 2015-11-07 | Payer: Medicare PPO | Source: Ambulatory Visit | Admitting: Surgery

## 2015-11-07 ENCOUNTER — Encounter: Payer: Self-pay | Admitting: Gastroenterology

## 2015-11-07 ENCOUNTER — Other Ambulatory Visit
Admission: RE | Admit: 2015-11-07 | Discharge: 2015-11-07 | Disposition: A | Discharge status: Home / Self Care | Payer: Medicare PPO | Source: Ambulatory Visit | Attending: Surgery | Admitting: Surgery

## 2015-11-07 DIAGNOSIS — K629 Disease of anus and rectum, unspecified: Secondary | ICD-10-CM | POA: Diagnosis present

## 2015-11-07 DIAGNOSIS — K6289 Other specified diseases of anus and rectum: Secondary | ICD-10-CM | POA: Diagnosis not present

## 2015-11-07 LAB — COMPREHENSIVE METABOLIC PANEL
ALT: 16 U/L (ref 14–54)
AST: 22 U/L (ref 15–41)
Albumin: 4.4 g/dL (ref 3.5–5.0)
Alkaline Phosphatase: 81 U/L (ref 38–126)
Anion gap: 10 (ref 5–15)
BILIRUBIN TOTAL: 0.4 mg/dL (ref 0.3–1.2)
BUN: 17 mg/dL (ref 6–20)
CHLORIDE: 103 mmol/L (ref 101–111)
CO2: 26 mmol/L (ref 22–32)
Calcium: 10.1 mg/dL (ref 8.9–10.3)
Creatinine, Ser: 0.83 mg/dL (ref 0.44–1.00)
Glucose, Bld: 101 mg/dL — ABNORMAL HIGH (ref 65–99)
POTASSIUM: 4.8 mmol/L (ref 3.5–5.1)
Sodium: 139 mmol/L (ref 135–145)
TOTAL PROTEIN: 8.5 g/dL — AB (ref 6.5–8.1)

## 2015-11-07 LAB — APTT: aPTT: 33 seconds (ref 24–36)

## 2015-11-07 LAB — CBC
HEMATOCRIT: 44.6 % (ref 35.0–47.0)
Hemoglobin: 15 g/dL (ref 12.0–16.0)
MCH: 29.1 pg (ref 26.0–34.0)
MCHC: 33.6 g/dL (ref 32.0–36.0)
MCV: 86.6 fL (ref 80.0–100.0)
PLATELETS: 250 10*3/uL (ref 150–440)
RBC: 5.15 MIL/uL (ref 3.80–5.20)
RDW: 13.9 % (ref 11.5–14.5)
WBC: 9.1 10*3/uL (ref 3.6–11.0)

## 2015-11-07 LAB — PROTIME-INR
INR: 0.9
PROTHROMBIN TIME: 12.4 s (ref 11.4–15.0)

## 2015-11-08 LAB — CEA: CEA: 4.1 ng/mL (ref 0.0–4.7)

## 2015-11-11 ENCOUNTER — Ambulatory Visit
Admission: RE | Admit: 2015-11-11 | Discharge: 2015-11-11 | Disposition: A | Discharge status: Home / Self Care | Payer: Medicare PPO | Source: Ambulatory Visit | Attending: Surgery | Admitting: Surgery

## 2015-11-11 DIAGNOSIS — K6289 Other specified diseases of anus and rectum: Secondary | ICD-10-CM | POA: Diagnosis present

## 2015-11-11 DIAGNOSIS — K5730 Diverticulosis of large intestine without perforation or abscess without bleeding: Secondary | ICD-10-CM | POA: Diagnosis not present

## 2015-11-11 HISTORY — DX: Unspecified asthma, uncomplicated: J45.909

## 2015-11-11 MED ORDER — IOHEXOL 300 MG/ML  SOLN
100.0000 mL | Freq: Once | INTRAMUSCULAR | Status: AC | PRN
  Administered 2015-11-11: 100 mL via INTRAVENOUS

## 2015-11-13 ENCOUNTER — Ambulatory Visit: Payer: Medicare PPO | Admitting: Surgery

## 2015-11-14 ENCOUNTER — Telehealth: Payer: Self-pay

## 2015-11-14 ENCOUNTER — Encounter: Payer: Self-pay | Admitting: Surgery

## 2015-11-14 ENCOUNTER — Other Ambulatory Visit
Admission: RE | Admit: 2015-11-14 | Discharge: 2015-11-14 | Disposition: A | Discharge status: Home / Self Care | Payer: Medicare PPO | Source: Ambulatory Visit | Attending: Surgery | Admitting: Surgery

## 2015-11-14 ENCOUNTER — Ambulatory Visit (INDEPENDENT_AMBULATORY_CARE_PROVIDER_SITE_OTHER): Payer: Medicare PPO | Admitting: Surgery

## 2015-11-14 VITALS — BP 134/80 | HR 81 | Temp 98.2°F | Ht 62.0 in | Wt 172.8 lb

## 2015-11-14 DIAGNOSIS — C210 Malignant neoplasm of anus, unspecified: Secondary | ICD-10-CM | POA: Insufficient documentation

## 2015-11-14 LAB — RAPID HIV SCREEN (HIV 1/2 AB+AG)
HIV 1/2 Antibodies: NONREACTIVE
HIV-1 P24 Antigen - HIV24: NONREACTIVE

## 2015-11-14 NOTE — Progress Notes (Signed)
Mrs. Peake 's following up after incisional biopsy of the anorectal mass. 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 She also had confirmatory colonoscopy showing the exact pathology. CT scan of the abdomen and pelvis showed no evidence of any other metastatic disease and chest x-ray did not show any evidence of metastasis. She continues to have some intermittent rectal pain. She is not obstructed she is having multiple bowel movements as per colonoscopy polyps also were removed. Further workup included a CEA that was normal and HIV test was normal. Cousin with the patient in detail about her new diagnosis. Mild intermittent hematochezia. CT scan reviewed personally, evidence of metastatic disease and ill defined anorectal lesion. She  Will benefit from MRI given the location  PE: No acute distress awake alert, nontoxic she is accompanied by family and friends Chest: CTA, NSR Abd: Soft, nontender, no masses no peritonitis Inguinal Area: No evidence of lymphadenopathy. Pelvic: Manual examination shows obvious evidence of the posterior wall of the vagina about 3 cm from the introitus. This is a heart lesion and likely coming from the rectum is not obvious that there is an mucosal ulceration on the vagina or primary from the vagina. Rectal: Recent anal canal lesion on the right anterolateral aspect extending into the anterior midline and involving the posterior wall of the vagina. Externally there is no evidence of an anal margin carcinoma. 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 Clinically no evidence of metastatic disease. MRI w constrast to eval extend, no evidence of inguinal involvement We will refer to: Medical oncology, radiation oncology, gynecological oncology for evaluation of vaginal involvement. IT would require  PET/CT for restaging Make sure that we will discuss her case on 2 more before Did gave her extensive counseling and also gave her the option of seeking a second opinion with a colorectal surgeon either at [facility]. For now she wishes to seek medical and radiation oncology first. Likely benefit from chemoradiation therapy. We'll be happy to place a port placement. The plans pending on consultations and depending on the incision and 2 more port Sensitive counseling provided and all questions were answered

## 2015-11-14 NOTE — Telephone Encounter (Signed)
Appointments were placed in [place]. They were given to patient at her appointment today.

## 2015-11-14 NOTE — Patient Instructions (Signed)
You will need to have an MRI of your Pelvis. Orders have been placed for this to be done. Please call [phone] to schedule this appointment.  You will also need to have an HIV blood test. Please go to the [place] at [facility] and check in at the Registration desk to have this done today.  You have been scheduled 3 appointments with our [place]: One if for Gynecologic Oncology, one is to see the Oncologist, and the other is for Radiation Oncology. Please see your appointment times below.  We will have you follow-up in our office on 11/28/15 with Dr. Dahlia Byes after you have seen all of your other physicians and after your MRI and HIV testing.

## 2015-11-14 NOTE — Telephone Encounter (Signed)
Spoke with Dr. Dahlia Byes regarding this patient. Patient will need the following Appointments: Gyncologic Oncology, Oncology, and Radiation Oncology. She will also need to have an MRI of the Pelvis with Contrast. Orders placed.  Call made to [facility] to get all of appointments scheduled. No answer, left voicemail for call back and explained that we will give patient appointment information during appointment this afternoon. Waiting on return phone call.

## 2015-11-17 ENCOUNTER — Ambulatory Visit
Admission: RE | Admit: 2015-11-17 | Discharge: 2015-11-17 | Disposition: A | Discharge status: Home / Self Care | Payer: Medicare PPO | Source: Ambulatory Visit | Attending: Radiation Oncology | Admitting: Radiation Oncology

## 2015-11-17 ENCOUNTER — Encounter: Payer: Self-pay | Admitting: Radiation Oncology

## 2015-11-17 ENCOUNTER — Other Ambulatory Visit: Payer: Self-pay | Admitting: *Deleted

## 2015-11-17 ENCOUNTER — Inpatient Hospital Stay: Payer: Medicare PPO | Attending: Oncology | Admitting: Oncology

## 2015-11-17 VITALS — BP 156/83 | HR 82 | Temp 97.2°F | Resp 18 | Wt 173.3 lb

## 2015-11-17 VITALS — BP 183/97 | HR 80 | Temp 96.2°F | Resp 22 | Wt 173.3 lb

## 2015-11-17 DIAGNOSIS — Q602 Renal agenesis, unspecified: Secondary | ICD-10-CM | POA: Diagnosis not present

## 2015-11-17 DIAGNOSIS — I2510 Atherosclerotic heart disease of native coronary artery without angina pectoris: Secondary | ICD-10-CM | POA: Insufficient documentation

## 2015-11-17 DIAGNOSIS — J439 Emphysema, unspecified: Secondary | ICD-10-CM | POA: Insufficient documentation

## 2015-11-17 DIAGNOSIS — J449 Chronic obstructive pulmonary disease, unspecified: Secondary | ICD-10-CM | POA: Diagnosis not present

## 2015-11-17 DIAGNOSIS — K625 Hemorrhage of anus and rectum: Secondary | ICD-10-CM | POA: Diagnosis not present

## 2015-11-17 DIAGNOSIS — C210 Malignant neoplasm of anus, unspecified: Secondary | ICD-10-CM | POA: Insufficient documentation

## 2015-11-17 DIAGNOSIS — K649 Unspecified hemorrhoids: Secondary | ICD-10-CM

## 2015-11-17 DIAGNOSIS — F17210 Nicotine dependence, cigarettes, uncomplicated: Secondary | ICD-10-CM

## 2015-11-17 DIAGNOSIS — M5136 Other intervertebral disc degeneration, lumbar region: Secondary | ICD-10-CM | POA: Diagnosis not present

## 2015-11-17 DIAGNOSIS — J45909 Unspecified asthma, uncomplicated: Secondary | ICD-10-CM | POA: Diagnosis not present

## 2015-11-17 DIAGNOSIS — E785 Hyperlipidemia, unspecified: Secondary | ICD-10-CM | POA: Diagnosis not present

## 2015-11-17 DIAGNOSIS — Z807 Family history of other malignant neoplasms of lymphoid, hematopoietic and related tissues: Secondary | ICD-10-CM | POA: Diagnosis not present

## 2015-11-17 DIAGNOSIS — Z510 Encounter for antineoplastic radiation therapy: Secondary | ICD-10-CM | POA: Insufficient documentation

## 2015-11-17 DIAGNOSIS — Z803 Family history of malignant neoplasm of breast: Secondary | ICD-10-CM | POA: Insufficient documentation

## 2015-11-17 DIAGNOSIS — I252 Old myocardial infarction: Secondary | ICD-10-CM | POA: Insufficient documentation

## 2015-11-17 DIAGNOSIS — I10 Essential (primary) hypertension: Secondary | ICD-10-CM

## 2015-11-17 DIAGNOSIS — Z86718 Personal history of other venous thrombosis and embolism: Secondary | ICD-10-CM | POA: Diagnosis not present

## 2015-11-17 DIAGNOSIS — Z79899 Other long term (current) drug therapy: Secondary | ICD-10-CM | POA: Insufficient documentation

## 2015-11-17 NOTE — Progress Notes (Signed)
  Oncology Nurse Navigator Documentation  Navigator Location: [facility]-Med Onc (11/17/15 1000) Navigator Encounter Type: Initial MedOnc;Initial RadOnc (11/17/15 1000)   Abnormal Finding Date: 10/24/15 (11/17/15 1000)       Patient Visit Type: Initial;MedOnc;RadOnc (11/17/15 1000) Treatment Phase: Pre-Tx/Tx Discussion (11/17/15 1000) Barriers/Navigation Needs: Coordination of Care (11/17/15 1000)   Interventions: Education Method (11/17/15 1000)     Education Method: Verbal (11/17/15 1000)      Acuity: Level 2 (11/17/15 1000)   Acuity Level 2: Initial guidance, education and coordination as needed;Educational needs;Assistance expediting appointments;Ongoing guidance and education throughout treatment as needed (11/17/15 1000)     Time Spent with Patient: 120 (11/17/15 1000)   [name] here for her consults with Dr Baruch Gouty and Grayland Ormond. Attended consults with patient. Answered questions she had. Briefly discussed PAC and chemo. PET ordered and scheduled. She will see Dr Theora Gianotti 3/8 along with Dr Grayland Ormond and Chrystal again. Went over instructions for PET. She has all appts. Provided her my contact information for any further questions or concerns.

## 2015-11-17 NOTE — Consult Note (Signed)
Except an outstanding is perfect of Radiation Oncology NEW PATIENT EVALUATION  Name: Carol Gay  MRN: [id number]  Date:   11/17/2015     DOB: [birth year]   This 70 y.o. female patient presents to the clinic for initial evaluation of anal cancer at least stage IIIa (T4 NX M0) squamous cell carcinoma.  REFERRING PHYSICIAN: Glean Hess, MD  CHIEF COMPLAINT:  Chief Complaint  Patient presents with  . Cancer    Pt is here for initial consultation of anal cancer.      DIAGNOSIS: The encounter diagnosis was Anal cancer ([place]).   PREVIOUS INVESTIGATIONS:  CT scans reviewed Pathology reports reviewed Clinical notes reviewed  HPI: Patient is a 70 year old female with a several month history of intermittent anorectal pain and slight  Bleeding which she initially attributed to hemorrhoids. She was found by her PMD to have a mass in the anal canal and was set up for biopsy as well as lower endoscopy. At the time of endoscopy a frond-like villous nonobstructing mass was found in distal rectum. 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 Biopsy was positive for squamous cell carcinoma. CT scan demonstrated a soft tissue fullness of the anal rectal junction again suspicious for newly diagnosed rectal cancer. No evidence of metastatic disease inguinal adenopathy or liver metastasis was noted. Patient has scheduled an MRI scan for further delineation of vaginal involvement. I've also ordered a PET CT scan to complete staging workup. Patient is doing fairly well still states she has anorectal pain although it is improved. She's having no significant bleeding at this time and no vaginal discharge.  PLANNED TREATMENT REGIMEN: Concurrent chemoradiation with curative intent  PAST MEDICAL HISTORY:  has a past medical history of Cigarette smoker; Hypertension; Arm pain; Rectal bleeding; COPD (chronic obstructive pulmonary disease) ([place]); Hemorrhoids; CAD in native artery; Pulmonary  emphysema ([place]); Dyslipidemia; Dermatophytoses; DDD (degenerative disc disease), lumbar; Aortic atherosclerosis ([place]); H/O blood clots; Shortness of breath dyspnea; Smokers' cough ([place]); Myocardial infarction ([place]); Congenital absence of one kidney; Vertigo; Wears dentures; and Asthma.    PAST SURGICAL HISTORY:  Past Surgical History  Procedure Laterality Date  . Vein ligation and stripping    . Ovarian cyst removal Left   . Appendectomy    . Lumbar disc surgery  1997  . Breast biopsy    . Exploratory laparotomy    . Lesion excision  11/03/15    Anal - Dr Dahlia Byes, [facility]  . Colonoscopy with propofol N/A 11/06/2015    Procedure: COLONOSCOPY WITH PROPOFOL;  Surgeon: Lucilla Lame, MD;  Location: [place];  Service: Endoscopy;  Laterality: N/A;  LEAVE PT EARLY  . Polypectomy  11/06/2015    Procedure: POLYPECTOMY;  Surgeon: Lucilla Lame, MD;  Location: [place];  Service: Endoscopy;;    FAMILY HISTORY: family history includes Cancer in her maternal grandmother and mother; Heart disease in her mother.  SOCIAL HISTORY:  reports that she has been smoking Cigarettes.  She has a 57 pack-year smoking history. She does not have any smokeless tobacco history on file. She reports that she does not drink alcohol or use illicit drugs.  ALLERGIES: Aspirin; Codeine; and Morphine and related  MEDICATIONS:  Current Outpatient Prescriptions  Medication Sig Dispense Refill  . albuterol (PROVENTIL HFA;VENTOLIN HFA) 108 (90 Base) MCG/ACT inhaler Inhale 2 puffs into the lungs every 6 (six) hours as needed for wheezing or shortness of breath. 3 Inhaler 1  . amLODipine (NORVASC) 5 MG tablet  Take 1 tablet (5 mg total) by mouth daily. 90 tablet 1  . atorvastatin (LIPITOR) 20 MG tablet Take 1 tablet (20 mg total) by mouth at bedtime. 90 tablet 1  . meclizine (ANTIVERT) 25 MG tablet Take 25 mg by mouth 3 (three) times daily as needed for dizziness.    . meloxicam (MOBIC) 15 MG tablet Take 1 tablet  (15 mg total) by mouth daily. 30 tablet 0  . Umeclidinium-Vilanterol (ANORO ELLIPTA) 62.5-25 MCG/INH AEPB Inhale 1 puff into the lungs daily. 180 each 1   No current facility-administered medications for this encounter.    ECOG PERFORMANCE STATUS:  1 - Symptomatic but completely ambulatory  REVIEW OF SYSTEMS: Except for the occasional rectal pain and bleeding  Patient denies any weight loss, fatigue, weakness, fever, chills or night sweats. Patient denies any loss of vision, blurred vision. Patient denies any ringing  of the ears or hearing loss. No irregular heartbeat. Patient denies heart murmur or history of fainting. Patient denies any chest pain or pain radiating to her upper extremities. Patient denies any shortness of breath, difficulty breathing at night, cough or hemoptysis. Patient denies any swelling in the lower legs. Patient denies any nausea vomiting, vomiting of blood, or coffee ground material in the vomitus. Patient denies any stomach pain. Patient states has had normal bowel movements no significant constipation or diarrhea. Patient denies any dysuria, hematuria or significant nocturia. Patient denies any problems walking, swelling in the joints or loss of balance. Patient denies any skin changes, loss of hair or loss of weight. Patient denies any excessive worrying or anxiety or significant depression. Patient denies any problems with insomnia. Patient denies excessive thirst, polyuria, polydipsia. Patient denies any swollen glands, patient denies easy bruising or easy bleeding. Patient denies any recent infections, allergies or URI. Patient "s visual fields have not changed significantly in recent time.    PHYSICAL EXAM: BP 183/97 mmHg  Pulse 80  Temp(Src) 96.2 F (35.7 C)  Resp 22  Wt 173 lb 4.5 oz (78.6 kg) Well-developed female in NAD. Based on her recent history of biopsy rectal exam was not performed today. Well-developed well-nourished patient in NAD. HEENT reveals  PERLA, EOMI, discs not visualized.  Oral cavity is clear. No oral mucosal lesions are identified. Neck is clear without evidence of cervical or supraclavicular adenopathy. Lungs are clear to A&P. Cardiac examination is essentially unremarkable with regular rate and rhythm without murmur rub or thrill. Abdomen is benign with no organomegaly or masses noted. Motor sensory and DTR levels are equal and symmetric in the upper and lower extremities. Cranial nerves II through XII are grossly intact. Proprioception is intact. No peripheral adenopathy or edema is identified. No motor or sensory levels are noted. Crude visual fields are within normal range.  LABORATORY DATA: Pathology report reviewed    RADIOLOGY RESULTS: CT scan reviewed MRI scan ordered PET CT scan ordered   IMPRESSION: Locally advanced squamous cell carcinoma the anus at least stage IIIa in 70 year old female with probable vaginal involvement.  PLAN: At this time even though this is locally advanced disease would treat in the same way as lower stage squamous cell carcinoma the anus with concurrent chemoradiation. [place] guidelines favor I am RT treatment planning and delivery up to 4500 cGy boosting the anal lesion another 1500 cGy based on the locally advanced T4 nature of her lesion. I would like to review a PET CT scan to rule out possibility of inguinal nodal involvement other pelvic adenopathy or liver involvement.  Risks and benefits of radiation including increased diarrhea, increased lower urinary tract symptoms, alteration of blood counts, fatigue, skin irritation all were discussed in detail. We also discussed the possibility of diverting colostomy should her rectal symptoms become too severe. Patient seems to comprehend my treatment plan well. Nurse navigator was present during my consultation. Patient will see GYN oncology as well as medical oncology in the next several days. I have set up and ordered CT simulation next week after review  of all her current studies.  I would like to take this opportunity for allowing me to participate in the care of your patient.Armstead Peaks., MD

## 2015-11-17 NOTE — Progress Notes (Signed)
Patient here as new evaluation for anal cancer.  Referred by P[facility].

## 2015-11-21 ENCOUNTER — Telehealth: Payer: Self-pay | Admitting: Surgery

## 2015-11-21 ENCOUNTER — Ambulatory Visit
Admission: RE | Admit: 2015-11-21 | Discharge: 2015-11-21 | Disposition: A | Discharge status: Home / Self Care | Payer: Medicare PPO | Source: Ambulatory Visit | Attending: Radiation Oncology | Admitting: Radiation Oncology

## 2015-11-21 DIAGNOSIS — C210 Malignant neoplasm of anus, unspecified: Secondary | ICD-10-CM | POA: Diagnosis present

## 2015-11-21 DIAGNOSIS — R918 Other nonspecific abnormal finding of lung field: Secondary | ICD-10-CM | POA: Insufficient documentation

## 2015-11-21 DIAGNOSIS — J439 Emphysema, unspecified: Secondary | ICD-10-CM | POA: Diagnosis not present

## 2015-11-21 LAB — GLUCOSE, CAPILLARY: GLUCOSE-CAPILLARY: 75 mg/dL (ref 65–99)

## 2015-11-21 MED ORDER — FLUDEOXYGLUCOSE F - 18 (FDG) INJECTION
11.9000 | Freq: Once | INTRAVENOUS | Status: AC | PRN
  Administered 2015-11-21: 11.9 via INTRAVENOUS

## 2015-11-21 NOTE — Telephone Encounter (Signed)
Dr Dahlia Byes is out of the office Friday 11/21/15 through Friday 11/28/15 - rescheduling his appointments. Patient had appt scheduled for Friday March 10th at 1:30 - need to reschedule to see P[place]on when he is in office 12/01/15 thru 12/04/15 I called patient to reschedule. No answer. Left message for her to call back.

## 2015-11-24 ENCOUNTER — Ambulatory Visit: Payer: Medicare PPO

## 2015-11-24 ENCOUNTER — Telehealth: Payer: Self-pay

## 2015-11-24 ENCOUNTER — Telehealth: Payer: Self-pay | Admitting: Surgery

## 2015-11-24 NOTE — Telephone Encounter (Signed)
Called and spoke to [facility] from [place]) and got patient's MRI-Pelvis with and without contrast approved.  Tracking # [id number]

## 2015-11-24 NOTE — Telephone Encounter (Signed)
This was taken care of over the phone at this time and I made Dr. Azalee Course aware of outcome.

## 2015-11-24 NOTE — Telephone Encounter (Signed)
I put info to the patients chart that Dr. Dahlia Byes needs to fill out under media for health Help

## 2015-11-26 ENCOUNTER — Other Ambulatory Visit: Payer: Self-pay

## 2015-11-26 ENCOUNTER — Ambulatory Visit
Admission: RE | Admit: 2015-11-26 | Discharge: 2015-11-26 | Disposition: A | Discharge status: Home / Self Care | Payer: Medicare PPO | Source: Ambulatory Visit | Attending: Radiation Oncology | Admitting: Radiation Oncology

## 2015-11-26 ENCOUNTER — Inpatient Hospital Stay (HOSPITAL_BASED_OUTPATIENT_CLINIC_OR_DEPARTMENT_OTHER): Payer: Medicare PPO | Admitting: Oncology

## 2015-11-26 ENCOUNTER — Inpatient Hospital Stay: Payer: Medicare PPO | Attending: Obstetrics and Gynecology | Admitting: Obstetrics and Gynecology

## 2015-11-26 VITALS — BP 144/92 | HR 91 | Temp 98.2°F | Ht 62.0 in | Wt 172.0 lb

## 2015-11-26 DIAGNOSIS — D490 Neoplasm of unspecified behavior of digestive system: Secondary | ICD-10-CM

## 2015-11-26 DIAGNOSIS — C211 Malignant neoplasm of anal canal: Secondary | ICD-10-CM | POA: Insufficient documentation

## 2015-11-26 DIAGNOSIS — K632 Fistula of intestine: Secondary | ICD-10-CM

## 2015-11-26 DIAGNOSIS — F17210 Nicotine dependence, cigarettes, uncomplicated: Secondary | ICD-10-CM

## 2015-11-26 DIAGNOSIS — I252 Old myocardial infarction: Secondary | ICD-10-CM | POA: Diagnosis not present

## 2015-11-26 DIAGNOSIS — I2510 Atherosclerotic heart disease of native coronary artery without angina pectoris: Secondary | ICD-10-CM

## 2015-11-26 DIAGNOSIS — Z79899 Other long term (current) drug therapy: Secondary | ICD-10-CM | POA: Insufficient documentation

## 2015-11-26 DIAGNOSIS — E785 Hyperlipidemia, unspecified: Secondary | ICD-10-CM | POA: Insufficient documentation

## 2015-11-26 DIAGNOSIS — R938 Abnormal findings on diagnostic imaging of other specified body structures: Secondary | ICD-10-CM

## 2015-11-26 DIAGNOSIS — Z5111 Encounter for antineoplastic chemotherapy: Secondary | ICD-10-CM | POA: Diagnosis present

## 2015-11-26 DIAGNOSIS — I10 Essential (primary) hypertension: Secondary | ICD-10-CM | POA: Insufficient documentation

## 2015-11-26 DIAGNOSIS — Z85048 Personal history of other malignant neoplasm of rectum, rectosigmoid junction, and anus: Secondary | ICD-10-CM | POA: Insufficient documentation

## 2015-11-26 DIAGNOSIS — Z510 Encounter for antineoplastic radiation therapy: Secondary | ICD-10-CM | POA: Diagnosis present

## 2015-11-26 DIAGNOSIS — J449 Chronic obstructive pulmonary disease, unspecified: Secondary | ICD-10-CM | POA: Diagnosis not present

## 2015-11-26 DIAGNOSIS — M5136 Other intervertebral disc degeneration, lumbar region: Secondary | ICD-10-CM

## 2015-11-26 DIAGNOSIS — C210 Malignant neoplasm of anus, unspecified: Secondary | ICD-10-CM | POA: Diagnosis not present

## 2015-11-26 MED ORDER — CAPECITABINE 500 MG PO TABS: 825.0000 mg/m2 | ORAL_TABLET | Freq: Two times a day (BID) | ORAL | Status: DC

## 2015-11-26 MED ORDER — PROCHLORPERAZINE MALEATE 10 MG PO TABS: 10.0000 mg | ORAL_TABLET | Freq: Four times a day (QID) | ORAL | Status: DC | PRN

## 2015-11-26 NOTE — Progress Notes (Signed)
  Oncology Nurse Navigator Documentation  Navigator Location: [facility]-Med Onc (11/26/15 1100) Navigator Encounter Type:  (initial GYN) (11/26/15 1100)           Patient Visit Type: Initial (GYN) (11/26/15 1100)                    Acuity: Level 2 (11/26/15 1100)   Acuity Level 2: Initial guidance, education and coordination as needed;Educational needs;Assistance expediting appointments;Ongoing guidance and education throughout treatment as needed (11/26/15 1100)     Time Spent with Patient: 30 (11/26/15 1100)   Chaperoned pelvic exam. Pap smear obtained.

## 2015-11-26 NOTE — Progress Notes (Signed)
Gynecologic Oncology Consult Visit   Referring Provider: Chrystal  Chief Concern: Anal cancer with concern for rectovaginal fistula on colonoscopy.  Subjective:  Carol Gay is a 70 y.o. female who is seen in consultation from Dr. Grayland Ormond and Chrystal to rule out rectovaginal fistula in context of newly diagnosed anal cancer.  She does not complain of any fecal incontinence or vaginal drainage.   Patient is a 70 year old female with a several month history of intermittent anorectal pain and slightbleeding which she initially attributed to hemorrhoids. She was found by Dr. Perrin Maltese in general surgery to have a mass in the anal canal and was set up for biopsy as well as lower endoscopy. At the time of endoscopy a frond-like villous nonobstructing mass was found in distal rectum. There is also noted to be a "fistula". Colonoscopy:  - One 4 mm polyp in the transverse colon, removed with a cold snare. Resected and retrieved. - Two 4 to 5 mm polyps at the recto-sigmoid colon, removed with a cold snare. Resected and retrieved. - Tumor in the rectum. Biopsied. - Colonic fistula. - Diverticulosis in the sigmoid colon.  Biopsy was positive for squamous cell carcinoma. CT scan demonstrated a soft tissue fullness of the anal rectal junction again suspicious for newly diagnosed rectal cancer. No evidence of metastatic disease inguinal adenopathy or liver metastasis was noted. Patient has scheduled an MRI scan for further delineation of vaginal involvement. PET CT scan 3/3 showed the hypermetabolic anal mass, but no evidence of metastatic disease.    Patient is doing fairly well still states she has anorectal pain although it is improved. She's having no significant bleeding at this time and no vaginal discharge.  Smokes on PPD cigarettes/    Problem List: Patient Active Problem List   Diagnosis Date Noted  . Blood in stool   . Benign neoplasm of transverse colon   . Benign neoplasm of rectosigmoid  junction   . Neoplasm of digestive system   . Fistula of intestine, excluding rectum and anus   . Pulmonary emphysema ([place]) 10/24/2015  . Encounter for long-term (current) use of medications 02/28/2015  . Aortic atherosclerosis ([place]) 12/30/2014  . CAD in native artery 12/30/2014  . DDD (degenerative disc disease), lumbar 12/30/2014  . Dyslipidemia 12/30/2014  . Polypharmacy 12/30/2014  . Essential (primary) hypertension 12/30/2014  . Dermatophytoses 12/30/2014  . Compulsive tobacco user syndrome 12/30/2014    Past Medical History: Past Medical History  Diagnosis Date  . Cigarette smoker   . Hypertension   . Arm pain   . Rectal bleeding   . COPD (chronic obstructive pulmonary disease) ([place])   . Hemorrhoids   . CAD in native artery   . Pulmonary emphysema ([place])   . Dyslipidemia   . Dermatophytoses   . DDD (degenerative disc disease), lumbar        . Aortic atherosclerosis ([place])   . H/O blood clots     left leg, veins stripped  . Shortness of breath dyspnea   . Smokers' cough ([place])   . Myocardial infarction [facility])     "mild" - age 51  . Congenital absence of one kidney     born with only right kidney  . Vertigo   . Wears dentures     has full upper and lower - doesn't wear  . Asthma     Past Surgical History: Past Surgical History  Procedure Laterality Date  . Vein ligation and stripping    . Ovarian cyst removal  Left   . Appendectomy    . Lumbar disc surgery  1997  . Breast biopsy    . Exploratory laparotomy    . Lesion excision  11/03/15    Anal - Dr Dahlia Byes, [facility]  . Colonoscopy with propofol N/A 11/06/2015    Procedure: COLONOSCOPY WITH PROPOFOL;  Surgeon: Lucilla Lame, MD;  Location: [place];  Service: Endoscopy;  Laterality: N/A;  LEAVE PT EARLY  . Polypectomy  11/06/2015    Procedure: POLYPECTOMY;  Surgeon: Lucilla Lame, MD;  Location: [place];  Service: Endoscopy;;    OB History:  NSVD x 3  Family History: Family History   Problem Relation Age of Onset  . Cancer Mother     lymphoma  . Heart disease Mother   . Cancer Maternal Grandmother     breast    Social History: Social History   Social History  . Marital Status: Widowed    Spouse Name: N/A  . Number of Children: N/A  . Years of Education: N/A   Occupational History  . Not on file.   Social History Main Topics  . Smoking status: Current Every Day Smoker -- 1.00 packs/day for 57 years    Types: Cigarettes  . Smokeless tobacco: Not on file     Comment: not quite ready  . Alcohol Use: No  . Drug Use: No  . Sexual Activity: Not on file   Other Topics Concern  . Not on file   Social History Narrative    Allergies: Allergies  Allergen Reactions  . Aspirin Nausea And Vomiting  . Codeine Nausea And Vomiting  . Morphine And Related Nausea And Vomiting    Current Medications: Current Outpatient Prescriptions  Medication Sig Dispense Refill  . albuterol (PROVENTIL HFA;VENTOLIN HFA) 108 (90 Base) MCG/ACT inhaler Inhale 2 puffs into the lungs every 6 (six) hours as needed for wheezing or shortness of breath. 3 Inhaler 1  . amLODipine (NORVASC) 5 MG tablet Take 1 tablet (5 mg total) by mouth daily. 90 tablet 1  . atorvastatin (LIPITOR) 20 MG tablet Take 1 tablet (20 mg total) by mouth at bedtime. 90 tablet 1  . meclizine (ANTIVERT) 25 MG tablet Take 25 mg by mouth 3 (three) times daily as needed for dizziness.    Marland Kitchen Umeclidinium-Vilanterol (ANORO ELLIPTA) 62.5-25 MCG/INH AEPB Inhale 1 puff into the lungs daily. 180 each 1   No current facility-administered medications for this visit.    Review of Systems General: negative for, fevers, chills, fatigue, changes in sleep, changes in weight or appetite Skin: negative for changes in color, texture, moles or lesions Eyes: negative for, changes in vision, pain, diplopia HEENT: negative for, change in hearing, pain, discharge, tinnitus, vertigo, voice changes, sore throat, neck  masses Breasts: negative for breast lumps Pulmonary: negative for, dyspnea, orthopnea, productive cough Cardiac: negative for, palpitations, syncope, pain, discomfort, pressure Gastrointestinal: negative for, dysphagia, nausea, vomiting, jaundice, pain, constipation, diarrhea, hematemesis, hematochezia Genitourinary/Sexual: negative for, dysuria, discharge, hesitancy, nocturia, retention, stones, infections, STD's, incontinence Ob/Gyn: negative for, irregular bleeding, pain Musculoskeletal: negative for, pain, stiffness, swelling, range of motion limitation Hematology: negative for, easy bruising, bleeding Neurologic/Psych: negative for, headaches, seizures, paralysis, weakness, tremor, change in gait, change in sensation, mood swings, depression, anxiety, change in memory  Objective:  Physical Examination:  BP 144/92 mmHg  Pulse 91  Temp(Src) 98.2 F (36.8 C) (Oral)  Ht '5\' 2"'$  (1.575 m)  Wt 171 lb 15.3 oz (78 kg)  BMI 31.44 kg/m2  ECOG Performance Status: 0 - Asymptomatic  General appearance: alert, cooperative and appears stated age HEENT:PERRLA, neck supple with midline trachea and thyroid without masses Lymph node survey: non-palpable, axillary, inguinal, supraclavicular Cardiovascular: regular rate and rhythm Respiratory: normal air entry, lungs clear to auscultation Breast exam: breasts appear normal, no suspicious masses, no skin or nipple changes or axillary nodes. Abdomen: soft, non-tender, without masses or organomegaly, no hernias and well healed incision Back: inspection of back is normal Extremities: extremities normal, atraumatic, no cyanosis or edema Skin exam - normal coloration and turgor, no rashes, no suspicious skin lesions noted. Neurological exam reveals alert, oriented, normal speech, no focal findings or movement disorder noted.  Pelvic: exam chaperoned by nurse, There is a 5 cm mass palpable in the right perineum and abutting the vagina.  No evidence of  fistula or mucosal involvement in the vagina.  Normal cervix without lesions, polyps or tenderness, uterus anteverted; Adnexa: normal adnexa in size, nontender and no masses; Uterus: uterus is normal size, shape, consistency and nontender; On rectal there is a 5 cm lass immediately inside the anus on the right. With no obvious communication to the perineum or vagina.    Assessment:  Carol Gay is a 70 y.o. female diagnosed with anal squamous cell cancer. No evidence of metastatic disease on CT/PET scan.  There is no evidence of invasion into the vagina or rectovaginal fistula.  I spoke to Dr. Allen Norris in gastroenterology and he clarified that there is an ulceration at the base of the anal tumor that he thinks there may be a sinus tract that communicates with the perineum, but he could not pass a probe through this.  He is not concerned about a rectovaginal fistula.    Medical co-morbidities complicating care:  Smokes 1 PPD  Plan:   Problem List Items Addressed This Visit      Digestive   Neoplasm of digestive system - Primary   Fistula of intestine, excluding rectum and anus     She is scheduled to start radiation with mitomycin and Xeloda chemotherapy with Drs. Chrystal and Finnegan.    I did a PAP smear today, since she has not had one in many years, but the cervix is normal on exam.  A total of 40 minutes were spent with the patient/family today; 30% was spent in education, counseling and coordination of care for anal cancer.    Mellody Drown, MD   CC:  Glean Hess, MD [address] [place], [place] [address] [phone]

## 2015-11-28 ENCOUNTER — Telehealth: Payer: Self-pay | Admitting: Oncology

## 2015-11-28 ENCOUNTER — Ambulatory Visit: Payer: Self-pay | Admitting: Surgery

## 2015-11-28 LAB — PAP LB AND HPV HIGH-RISK
HPV, high-risk: POSITIVE — AB
PAP SMEAR COMMENT: 0

## 2015-11-28 NOTE — Telephone Encounter (Signed)
She wanted to let Nira Conn know that it doesn't look like she will be covered under Medicare but [employer]. She said they are not contracted to fill certain medications and so she will transfer this to [facility] and fax you the information. If you have any questions you may call her: [phone]. Thanks.

## 2015-11-28 NOTE — Progress Notes (Signed)
[facility]  [phone] [phone]  ID: KEYLEE SHRESTHA OB: 1946/01/04  MR#: [id number]  QQI#:[id number]  Patient Care Team: Glean Hess, MD as PCP - General (Family Medicine) Clent Jacks, RN as Registered Nurse  CHIEF COMPLAINT:  Chief Complaint  Patient presents with  . New Evaluation    INTERVAL HISTORY: Patient is a 70 year old female with a several months history of rectal pain and occasional bleeding which she attributed to hemorrhoids. Further evaluation revealed a mass in her anal canal which has been biopsy confirmed squamous cell carcinoma. There is also concern for anovaginal fistula. Currently, patient feels well. She does not complain of pain or bleeding at this time. She has no neurologic complaints. She has good appetite and denies weight loss. She denies any recent fevers. She has no chest pain or shortness of breath. She denies any nausea, vomiting, constipation, or diarrhea. She has no urinary complaints. Patient otherwise feels well and offers no further specific complaints.  REVIEW OF SYSTEMS:   Review of Systems  Constitutional: Negative.  Negative for fever, weight loss and malaise/fatigue.  Respiratory: Negative.  Negative for cough and wheezing.   Cardiovascular: Negative.  Negative for chest pain.  Gastrointestinal: Positive for blood in stool. Negative for nausea, vomiting, abdominal pain, diarrhea and constipation.  Genitourinary: Negative.  Negative for dysuria and frequency.  Neurological: Negative.  Negative for weakness.  Psychiatric/Behavioral: Negative.     As per HPI. Otherwise, a complete review of systems is negatve.  PAST MEDICAL HISTORY: Past Medical History  Diagnosis Date  . Cigarette smoker   . Hypertension   . Arm pain   . Rectal bleeding   . COPD (chronic obstructive pulmonary disease) ([place])   . Hemorrhoids   . CAD in native artery   . Pulmonary emphysema ([place])   . Dyslipidemia   .  Dermatophytoses   . DDD (degenerative disc disease), lumbar        . Aortic atherosclerosis ([place])   . H/O blood clots     left leg, veins stripped  . Shortness of breath dyspnea   . Smokers' cough ([place])   . Myocardial infarction [facility])     "mild" - age 80  . Congenital absence of one kidney     born with only right kidney  . Vertigo   . Wears dentures     has full upper and lower - doesn't wear  . Asthma     PAST SURGICAL HISTORY: Past Surgical History  Procedure Laterality Date  . Vein ligation and stripping    . Ovarian cyst removal Left   . Appendectomy    . Lumbar disc surgery  1997  . Breast biopsy    . Exploratory laparotomy    . Lesion excision  11/03/15    Anal - Dr Dahlia Byes, [facility]  . Colonoscopy with propofol N/A 11/06/2015    Procedure: COLONOSCOPY WITH PROPOFOL;  Surgeon: Lucilla Lame, MD;  Location: [facility];  Service: Endoscopy;  Laterality: N/A;  LEAVE PT EARLY  . Polypectomy  11/06/2015    Procedure: POLYPECTOMY;  Surgeon: Lucilla Lame, MD;  Location: [place];  Service: Endoscopy;;    FAMILY HISTORY Family History  Problem Relation Age of Onset  . Cancer Mother     lymphoma  . Heart disease Mother   . Cancer Maternal Grandmother     breast       ADVANCED DIRECTIVES:    HEALTH MAINTENANCE: Social History  Substance  Use Topics  . Smoking status: Current Every Day Smoker -- 1.00 packs/day for 57 years    Types: Cigarettes  . Smokeless tobacco: Not on file     Comment: not quite ready  . Alcohol Use: No     Colonoscopy:  PAP:  Bone density:  Lipid panel:  Allergies  Allergen Reactions  . Aspirin Nausea And Vomiting  . Codeine Nausea And Vomiting  . Morphine And Related Nausea And Vomiting    Current Outpatient Prescriptions  Medication Sig Dispense Refill  . albuterol (PROVENTIL HFA;VENTOLIN HFA) 108 (90 Base) MCG/ACT inhaler Inhale 2 puffs into the lungs every 6 (six) hours as needed for wheezing or shortness of  breath. 3 Inhaler 1  . amLODipine (NORVASC) 5 MG tablet Take 1 tablet (5 mg total) by mouth daily. 90 tablet 1  . atorvastatin (LIPITOR) 20 MG tablet Take 1 tablet (20 mg total) by mouth at bedtime. 90 tablet 1  . meclizine (ANTIVERT) 25 MG tablet Take 25 mg by mouth 3 (three) times daily as needed for dizziness.    Marland Kitchen Umeclidinium-Vilanterol (ANORO ELLIPTA) 62.5-25 MCG/INH AEPB Inhale 1 puff into the lungs daily. 180 each 1  . capecitabine (XELODA) 500 MG tablet Take 3 tablets (1,500 mg total) by mouth 2 (two) times daily after a meal. 120 tablet 0  . prochlorperazine (COMPAZINE) 10 MG tablet Take 1 tablet (10 mg total) by mouth every 6 (six) hours as needed (Nausea or vomiting). 30 tablet 1   No current facility-administered medications for this visit.    OBJECTIVE: Filed Vitals:   11/17/15 1006  BP: 156/83  Pulse: 82  Temp: 97.2 F (36.2 C)  Resp: 18     Body mass index is 31.69 kg/(m^2).    ECOG FS:0 - Asymptomatic  General: Well-developed, well-nourished, no acute distress. Eyes: Pink conjunctiva, anicteric sclera. HEENT: Normocephalic, moist mucous membranes, clear oropharnyx. Lungs: Clear to auscultation bilaterally. Heart: Regular rate and rhythm. No rubs, murmurs, or gallops. Abdomen: Soft, nontender, nondistended. No organomegaly noted, normoactive bowel sounds. Musculoskeletal: No edema, cyanosis, or clubbing. Neuro: Alert, answering all questions appropriately. Cranial nerves grossly intact. Skin: No rashes or petechiae noted. Psych: Normal affect. Lymphatics: No cervical, calvicular, axillary or inguinal LAD.   LAB RESULTS:  Lab Results  Component Value Date   NA 139 11/07/2015   K 4.8 11/07/2015   CL 103 11/07/2015   CO2 26 11/07/2015   GLUCOSE 101* 11/07/2015   BUN 17 11/07/2015   CREATININE 0.83 11/07/2015   CALCIUM 10.1 11/07/2015   PROT 8.5* 11/07/2015   ALBUMIN 4.4 11/07/2015   AST 22 11/07/2015   ALT 16 11/07/2015   ALKPHOS 81 11/07/2015   BILITOT  0.4 11/07/2015   GFRNONAA >60 11/07/2015   GFRAA >60 11/07/2015    Lab Results  Component Value Date   WBC 9.1 11/07/2015   NEUTROABS 4.2 09/02/2015   HGB 15.0 11/07/2015   HCT 44.6 11/07/2015   MCV 86.6 11/07/2015   PLT 250 11/07/2015     STUDIES: Ct Abdomen Pelvis W Contrast  11/11/2015  CLINICAL DATA:  Rectal pain. Newly diagnosed rectal carcinoma by biopsy. EXAM: CT ABDOMEN AND PELVIS WITH CONTRAST TECHNIQUE: Multidetector CT imaging of the abdomen and pelvis was performed using the standard protocol following bolus administration of intravenous contrast. CONTRAST:  189m OMNIPAQUE IOHEXOL 300 MG/ML  SOLN COMPARISON:  None. FINDINGS: Lower chest:  No acute findings.  Bibasilar emphysema noted. Hepatobiliary: No masses or other significant abnormality. Gallbladder is unremarkable. Pancreas: No  mass, inflammatory changes, or other significant abnormality. Spleen: Within normal limits in size and appearance. Adrenals/Urinary Tract: No masses identified. No evidence of hydronephrosis. Stomach/Bowel: No evidence of obstruction, inflammatory process, or abnormal fluid collections. Mild sigmoid diverticulosis is noted, however there is no evidence of diverticulitis. Subtle soft tissue fullness is noted at the anorectal junction, suspicious for sided newly diagnosed rectal carcinoma. Tiny less than 5 mm right perirectal lymph nodes are noted on image 64, which are not considered pathologically enlarged. Vascular/Lymphatic: No pathologically enlarged lymph nodes. No evidence of abdominal aortic aneurysm. Ectasia of infrarenal abdominal aorta are noted measuring 2.5 cm in maximum diameter. Reproductive: No mass or other significant abnormality. Other: None. Musculoskeletal:  No suspicious bone lesions identified. IMPRESSION: Soft tissue fullness at anorectal junction, suspicious for site of newly diagnosed rectal carcinoma. No evidence of metastatic disease within the abdomen or pelvis. Mild sigmoid  diverticulosis.  No evidence of diverticulitis. Electronically Signed   By: Earle Gell M.D.   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 EXAM: NUCLEAR MEDICINE PET SKULL BASE TO THIGH TECHNIQUE: 11.9 mCi F-18 FDG was injected intravenously. Full-ring PET imaging was performed from the skull base to thigh after the radiotracer. CT data was obtained and used for attenuation correction and anatomic localization. FASTING BLOOD GLUCOSE:  Value: 75 mg/dl COMPARISON:  11/11/2015 CT abdomen/ pelvis. FINDINGS: NECK No hypermetabolic lymph nodes in the neck. Relatively symmetric uptake in the glottis is probably physiologic. CHEST No hypermetabolic axillary, mediastinal or hilar nodes. Coarsely calcified granulomatous left hilar node. Atherosclerotic nonaneurysmal thoracic aorta. Dilated main pulmonary artery (3.3 cm diameter). Mild-to-moderate centrilobular emphysema and diffuse bronchial wall thickening. No acute consolidative airspace disease, significant pulmonary nodules or lung masses. Subpleural reticulation throughout both lungs is nonspecific and not appreciably changed. ABDOMEN/PELVIS Hypermetabolic anal mass measuring approximately 3.9 x 3.1 cm (series 3/image 249) with max SUV 15.8. No hypermetabolic inguinal or other pelvic lymph nodes. No hypermetabolic abdominal lymph nodes. No abnormal hypermetabolic activity within the liver, pancreas, adrenal glands, or spleen. Ectatic atherosclerotic infrarenal abdominal aorta, maximum diameter 2.6 cm. Mild distal colonic diverticulosis. SKELETON No focal hypermetabolic activity to suggest skeletal metastasis. IMPRESSION: 1. Hypermetabolic anal mass, in keeping with primary anal carcinoma. 2. No hypermetabolic locoregional nodal, liver or distant metastases. 3. Mild to moderate  emphysema and diffuse bronchial wall thickening, suggesting COPD. 4. Dilated main pulmonary artery, suggesting pulmonary arterial hypertension. 5. Subpleural reticulation throughout both lungs, nonspecific, cannot exclude interstitial lung disease. Consider correlation with high-resolution chest CT in 6-12 months if clinically warranted. Electronically Signed   By: Ilona Sorrel M.D.   On: 11/21/2015 12:49    ASSESSMENT: Stage II (T2,N0,M0) squamous cell carcinoma the anus.  PLAN:    1. Anal carcinoma: CT and PET results reviewed independently and reported as above with no suggestion of local regional or distant disease. Patient will have consultation with gynecology oncology next week to rule out any rectal vaginal fistula. Patient will return to clinic in approximately one week for further evaluation and treatment planning.  Approximately 45 minutes was spent in discussion of which greater than 50% was consultation.  Patient expressed understanding and was in agreement with this plan. She also understands that She can call clinic at any time with any questions, concerns, or complaints.  Lloyd Huger, MD   11/28/2015 3:31 PM

## 2015-12-01 ENCOUNTER — Ambulatory Visit (INDEPENDENT_AMBULATORY_CARE_PROVIDER_SITE_OTHER): Payer: Medicare PPO | Admitting: Surgery

## 2015-12-01 ENCOUNTER — Encounter: Payer: Self-pay | Admitting: Surgery

## 2015-12-01 VITALS — BP 175/81 | HR 102 | Temp 97.5°F | Wt 171.0 lb

## 2015-12-01 DIAGNOSIS — C210 Malignant neoplasm of anus, unspecified: Secondary | ICD-10-CM | POA: Diagnosis not present

## 2015-12-01 MED ORDER — ALPRAZOLAM 0.5 MG PO TABS: 0.5000 mg | ORAL_TABLET | Freq: Every evening | ORAL | Status: DC | PRN

## 2015-12-01 NOTE — Patient Instructions (Signed)
We will see you back in 6 months to check up on how you are doing.

## 2015-12-01 NOTE — Progress Notes (Signed)
Carol Gay following up for her squamous cell carcinoma of the anal canal. Gynecology oncology evaluation revealed no evidence of mucosal vaginal involvement, normal Pap smear and normal cervix. PET/CT no evidence of metastatic disease. MRI pending the patient had to cancel it secondary to being sick. She is now requesting some anxiolytics before her MRI is done.  Clinically she is at least stage III and there is involvement of the posterior wall of the vagina but not into the mucosa this was done by me with bimanual examination. She is scheduled to undergo chemoradiation therapy  A/P anal canal squamous cell carcinoma at least T3 lesion pending MRI definitively measure the size of the tumor. Evidence of metastatic component. We'll present her at tumor Board. She will need a follow-up colonoscopy and rectal examination with me in 6 months Extensive counseling provided. Plan about 25 minutes face-to-face with counseling Will prescribe xanax before MRI

## 2015-12-02 ENCOUNTER — Telehealth: Payer: Self-pay

## 2015-12-02 NOTE — Patient Instructions (Signed)
Mitomycin injection What is this medicine? MITOMYCIN (mye toe MYE sin) is a chemotherapy drug. This medicine is used to treat cancer of the stomach and pancreas. This medicine may be used for other purposes; ask your health care provider or pharmacist if you have questions. What should I tell my health care provider before I take this medicine? They need to know if you have any of these conditions: -anemia -bleeding disorder -infection (especially a virus infection such as chickenpox, cold sores, or herpes) -kidney disease -low blood counts like low platelets, red blood cells, white blood cells -recent radiation therapy -an unusual or allergic reaction to mitomycin, other chemotherapy agents, other medicines, foods, dyes, or preservatives -pregnant or trying to get pregnant -breast-feeding How should I use this medicine? This drug is given as an injection or infusion into a vein. It is administered in a hospital or clinic by a specially trained health care professional. Talk to your pediatrician regarding the use of this medicine in children. Special care may be needed. Overdosage: If you think you have taken too much of this medicine contact a poison control center or emergency room at once. NOTE: This medicine is only for you. Do not share this medicine with others. What if I miss a dose? It is important not to miss your dose. Call your doctor or health care professional if you are unable to keep an appointment. What may interact with this medicine? -medicines to increase blood counts like filgrastim, pegfilgrastim, sargramostim -vaccines This list may not describe all possible interactions. Give your health care provider a list of all the medicines, herbs, non-prescription drugs, or dietary supplements you use. Also tell them if you smoke, drink alcohol, or use illegal drugs. Some items may interact with your medicine. What should I watch for while using this medicine? Your condition  will be monitored carefully while you are receiving this medicine. You will need important blood work done while you are taking this medicine. This drug may make you feel generally unwell. This is not uncommon, as chemotherapy can affect healthy cells as well as cancer cells. Report any side effects. Continue your course of treatment even though you feel ill unless your doctor tells you to stop. Call your doctor or health care professional for advice if you get a fever, chills or sore throat, or other symptoms of a cold or flu. Do not treat yourself. This drug decreases your body's ability to fight infections. Try to avoid being around people who are sick. This medicine may increase your risk to bruise or bleed. Call your doctor or health care professional if you notice any unusual bleeding. Be careful brushing and flossing your teeth or using a toothpick because you may get an infection or bleed more easily. If you have any dental work done, tell your dentist you are receiving this medicine. Avoid taking products that contain aspirin, acetaminophen, ibuprofen, naproxen, or ketoprofen unless instructed by your doctor. These medicines may hide a fever. Do not become pregnant while taking this medicine. Women should inform their doctor if they wish to become pregnant or think they might be pregnant. There is a potential for serious side effects to an unborn child. Talk to your health care professional or pharmacist for more information. Do not breast-feed an infant while taking this medicine. What side effects may I notice from receiving this medicine? Side effects that you should report to your doctor or health care professional as soon as possible: -allergic reactions like skin rash, itching  or hives, swelling of the face, lips, or tongue -low blood counts - this medicine may decrease the number of white blood cells, red blood cells and platelets. You may be at increased risk for infections and  bleeding. -signs of infection - fever or chills, cough, sore throat, pain or difficulty passing urine -signs of decreased platelets or bleeding - bruising, pinpoint red spots on the skin, black, tarry stools, blood in the urine -signs of decreased red blood cells - unusually weak or tired, fainting spells, lightheadedness -breathing problems -changes in vision -chest pain -confusion -dry cough -high blood pressure -mouth sores -pain, swelling, redness at site where injected -pain, tingling, numbness in the hands or feet -seizures -swelling of the ankles, feet, hands -trouble passing urine or change in the amount of urine Side effects that usually do not require medical attention (report to your doctor or health care professional if they continue or are bothersome): -diarrhea -green to blue color of urine -hair loss -loss of appetite -nausea, vomiting This list may not describe all possible side effects. Call your doctor for medical advice about side effects. You may report side effects to FDA at 1-800-FDA-1088. Where should I keep my medicine? This drug is given in a hospital or clinic and will not be stored at home. NOTE: This sheet is a summary. It may not cover all possible information. If you have questions about this medicine, talk to your doctor, pharmacist, or health care provider.    2016, [employer]. (2008-03-14 11:16:23)

## 2015-12-02 NOTE — Telephone Encounter (Signed)
Patient received a call from [employer] and her co-pay would be $358.62 which she can't afford on her fixed income.  I have spoken with [employer] and they do not have any funding available at this time.  Our social worker is going to reach out to the drug company.

## 2015-12-02 NOTE — Telephone Encounter (Signed)
Capecitabine '500mg'$  tablets medicine is denied on the [employer] part D but is approved on Medicare Part B (Medical).  [place] [id number] valid thru 11/30/2017.

## 2015-12-03 ENCOUNTER — Other Ambulatory Visit: Payer: Self-pay | Admitting: *Deleted

## 2015-12-03 DIAGNOSIS — C210 Malignant neoplasm of anus, unspecified: Secondary | ICD-10-CM

## 2015-12-03 DIAGNOSIS — Z510 Encounter for antineoplastic radiation therapy: Secondary | ICD-10-CM | POA: Diagnosis not present

## 2015-12-03 MED ORDER — ALPRAZOLAM 0.25 MG PO TABS: 0.2500 mg | ORAL_TABLET | Freq: Two times a day (BID) | ORAL | Status: DC | PRN

## 2015-12-03 NOTE — Addendum Note (Signed)
Addended by: Caroleen Hamman F on: 12/03/2015 04:09 PM   Modules accepted: Orders

## 2015-12-04 ENCOUNTER — Telehealth: Payer: Self-pay | Admitting: Surgery

## 2015-12-04 ENCOUNTER — Inpatient Hospital Stay: Payer: Medicare PPO

## 2015-12-04 MED ORDER — BUPIVACAINE-EPINEPHRINE (PF) 0.25% -1:200000 IJ SOLN
INTRAMUSCULAR | Status: AC
  Filled 2015-12-04: qty 30

## 2015-12-04 MED ORDER — HEPARIN SODIUM (PORCINE) 5000 UNIT/ML IJ SOLN
INTRAMUSCULAR | Status: AC
  Filled 2015-12-04: qty 1

## 2015-12-04 MED ORDER — LIDOCAINE HCL (PF) 1 % IJ SOLN
INTRAMUSCULAR | Status: AC
  Filled 2015-12-04: qty 30

## 2015-12-04 MED ORDER — CEFAZOLIN SODIUM-DEXTROSE 2-3 GM-% IV SOLR: 2.0000 g | INTRAVENOUS | Status: AC

## 2015-12-04 MED ORDER — CHLORHEXIDINE GLUCONATE 4 % EX LIQD: 1.0000 "application " | Freq: Once | CUTANEOUS | Status: DC

## 2015-12-04 NOTE — Telephone Encounter (Signed)
Called patient to reschedule her port-placement. I told her that her port-placement will be on 12/10/2015 and it will be done by Dr. Dahlia Byes. I also told her that I would contact the cancer center to let them know when it would be placed. Told patient to call [phone] the day before between 1-3 pm. Patient understood.

## 2015-12-04 NOTE — Telephone Encounter (Signed)
Patient called this morning to cancel her surgery for port placement. Patient states her son has a doctors appointment and her daughter n law has to take him to appointment, so now she doesn't have transportation. Please call to reschedule port placement.

## 2015-12-05 MED ORDER — LIDOCAINE-PRILOCAINE 2.5-2.5 % EX CREA: TOPICAL_CREAM | CUTANEOUS | Status: DC

## 2015-12-05 NOTE — Progress Notes (Signed)
[place]  [phone] [phone]  ID: Carol Gay OB: [birth year]  MR#: [id number]  YCX#:[id number]  Patient Care Team: Glean Hess, MD as PCP - General (Family Medicine) Clent Jacks, RN as Registered Nurse  CHIEF COMPLAINT:  No chief complaint on file.   INTERVAL HISTORY: Patient returns to clinic today for further evaluation and treatment planning. She continues to feel well and is asymptomatic. She does not complain of pain or bleeding at this time. She has no neurologic complaints. She has good appetite and denies weight loss. She denies any recent fevers. She has no chest pain or shortness of breath. She denies any nausea, vomiting, constipation, or diarrhea. She has no urinary complaints. Patient offers no specific complaints today.  REVIEW OF SYSTEMS:   Review of Systems  Constitutional: Negative.  Negative for fever, weight loss and malaise/fatigue.  Respiratory: Negative.  Negative for cough and wheezing.   Cardiovascular: Negative.  Negative for chest pain.  Gastrointestinal: Negative.  Negative for nausea, vomiting, abdominal pain, diarrhea, constipation and blood in stool.  Genitourinary: Negative.  Negative for dysuria and frequency.  Neurological: Negative.  Negative for weakness.  Psychiatric/Behavioral: Negative.     As per HPI. Otherwise, a complete review of systems is negatve.  PAST MEDICAL HISTORY: Past Medical History  Diagnosis Date  . Cigarette smoker   . Hypertension   . Arm pain   . Rectal bleeding   . COPD (chronic obstructive pulmonary disease) ([place])   . Hemorrhoids   . CAD in native artery   . Pulmonary emphysema ([place])   . Dyslipidemia   . Dermatophytoses   . DDD (degenerative disc disease), lumbar        . Aortic atherosclerosis ([place])   . H/O blood clots     left leg, veins stripped  . Shortness of breath dyspnea   . Smokers' cough ([place])   . Myocardial infarction [facility])     "mild" -  age 64  . Congenital absence of one kidney     born with only right kidney  . Vertigo   . Wears dentures     has full upper and lower - doesn't wear  . Asthma   . Claustrophobia     PAST SURGICAL HISTORY: Past Surgical History  Procedure Laterality Date  . Vein ligation and stripping    . Ovarian cyst removal Left   . Appendectomy    . Lumbar disc surgery  1997  . Breast biopsy    . Exploratory laparotomy    . Lesion excision  11/03/15    Anal - Dr Dahlia Byes, [facility]  . Colonoscopy with propofol N/A 11/06/2015    Procedure: COLONOSCOPY WITH PROPOFOL;  Surgeon: Lucilla Lame, MD;  Location: [place];  Service: Endoscopy;  Laterality: N/A;  LEAVE PT EARLY  . Polypectomy  11/06/2015    Procedure: POLYPECTOMY;  Surgeon: Lucilla Lame, MD;  Location: [place];  Service: Endoscopy;;    FAMILY HISTORY Family History  Problem Relation Age of Onset  . Cancer Mother     lymphoma  . Heart disease Mother   . Cancer Maternal Grandmother     breast       ADVANCED DIRECTIVES:    HEALTH MAINTENANCE: Social History  Substance Use Topics  . Smoking status: Current Every Day Smoker -- 1.00 packs/day for 57 years    Types: Cigarettes  . Smokeless tobacco: Not on file     Comment: not quite ready  .  Alcohol Use: No     Colonoscopy:  PAP:  Bone density:  Lipid panel:  Allergies  Allergen Reactions  . Aspirin Nausea And Vomiting  . Codeine Nausea And Vomiting  . Morphine And Related Nausea And Vomiting    Current Outpatient Prescriptions  Medication Sig Dispense Refill  . albuterol (PROVENTIL HFA;VENTOLIN HFA) 108 (90 Base) MCG/ACT inhaler Inhale 2 puffs into the lungs every 6 (six) hours as needed for wheezing or shortness of breath. 3 Inhaler 1  . ALPRAZolam (XANAX) 0.25 MG tablet Take 1 tablet (0.25 mg total) by mouth 2 (two) times daily as needed for anxiety. 30 tablet 0  . ALPRAZolam (XANAX) 0.5 MG tablet Take 1 tablet (0.5 mg total) by mouth at bedtime as  needed for anxiety. 2 tablet 0  . amLODipine (NORVASC) 5 MG tablet Take 1 tablet (5 mg total) by mouth daily. 90 tablet 1  . atorvastatin (LIPITOR) 20 MG tablet Take 1 tablet (20 mg total) by mouth at bedtime. 90 tablet 1  . capecitabine (XELODA) 500 MG tablet Take 3 tablets (1,500 mg total) by mouth 2 (two) times daily after a meal. 120 tablet 0  . meclizine (ANTIVERT) 25 MG tablet Take 25 mg by mouth 3 (three) times daily as needed for dizziness.    . prochlorperazine (COMPAZINE) 10 MG tablet Take 1 tablet (10 mg total) by mouth every 6 (six) hours as needed (Nausea or vomiting). 30 tablet 1  . Umeclidinium-Vilanterol (ANORO ELLIPTA) 62.5-25 MCG/INH AEPB Inhale 1 puff into the lungs daily. 180 each 1   No current facility-administered medications for this visit.   Facility-Administered Medications Ordered in Other Visits  Medication Dose Route Frequency Provider Last Rate Last Dose  . chlorhexidine (HIBICLENS) 4 % liquid 1 application  1 application Topical Once Jules Husbands, MD        OBJECTIVE: There were no vitals filed for this visit.   There is no weight on file to calculate BMI.    ECOG FS:0 - Asymptomatic  General: Well-developed, well-nourished, no acute distress. Eyes: Pink conjunctiva, anicteric sclera. Lungs: Clear to auscultation bilaterally. Heart: Regular rate and rhythm. No rubs, murmurs, or gallops. Abdomen: Soft, nontender, nondistended. No organomegaly noted, normoactive bowel sounds. Musculoskeletal: No edema, cyanosis, or clubbing. Neuro: Alert, answering all questions appropriately. Cranial nerves grossly intact. Skin: No rashes or petechiae noted. Psych: Normal affect.   LAB RESULTS:  Lab Results  Component Value Date   NA 139 11/07/2015   K 4.8 11/07/2015   CL 103 11/07/2015   CO2 26 11/07/2015   GLUCOSE 101* 11/07/2015   BUN 17 11/07/2015   CREATININE 0.83 11/07/2015   CALCIUM 10.1 11/07/2015   PROT 8.5* 11/07/2015   ALBUMIN 4.4 11/07/2015   AST  22 11/07/2015   ALT 16 11/07/2015   ALKPHOS 81 11/07/2015   BILITOT 0.4 11/07/2015   GFRNONAA >60 11/07/2015   GFRAA >60 11/07/2015    Lab Results  Component Value Date   WBC 9.1 11/07/2015   NEUTROABS 4.2 09/02/2015   HGB 15.0 11/07/2015   HCT 44.6 11/07/2015   MCV 86.6 11/07/2015   PLT 250 11/07/2015     STUDIES: Ct Abdomen Pelvis W Contrast  11/11/2015  CLINICAL DATA:  Rectal pain. Newly diagnosed rectal carcinoma by biopsy. EXAM: CT ABDOMEN AND PELVIS WITH CONTRAST TECHNIQUE: Multidetector CT imaging of the abdomen and pelvis was performed using the standard protocol following bolus administration of intravenous contrast. CONTRAST:  124m OMNIPAQUE IOHEXOL 300 MG/ML  SOLN COMPARISON:  None. FINDINGS: Lower chest:  No acute findings.  Bibasilar emphysema noted. Hepatobiliary: No masses or other significant abnormality. Gallbladder is unremarkable. Pancreas: No mass, inflammatory changes, or other significant abnormality. Spleen: Within normal limits in size and appearance. Adrenals/Urinary Tract: No masses identified. No evidence of hydronephrosis. Stomach/Bowel: No evidence of obstruction, inflammatory process, or abnormal fluid collections. Mild sigmoid diverticulosis is noted, however there is no evidence of diverticulitis. Subtle soft tissue fullness is noted at the anorectal junction, suspicious for sided newly diagnosed rectal carcinoma. Tiny less than 5 mm right perirectal lymph nodes are noted on image 64, which are not considered pathologically enlarged. Vascular/Lymphatic: No pathologically enlarged lymph nodes. No evidence of abdominal aortic aneurysm. Ectasia of infrarenal abdominal aorta are noted measuring 2.5 cm in maximum diameter. Reproductive: No mass or other significant abnormality. Other: None. Musculoskeletal:  No suspicious bone lesions identified. IMPRESSION: Soft tissue fullness at anorectal junction, suspicious for site of newly diagnosed rectal carcinoma. No  evidence of metastatic disease within the abdomen or pelvis. Mild sigmoid diverticulosis.  No evidence of diverticulitis. Electronically Signed   By: Earle Gell M.D.   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 EXAM: NUCLEAR MEDICINE PET SKULL BASE TO THIGH TECHNIQUE: 11.9 mCi F-18 FDG was injected intravenously. Full-ring PET imaging was performed from the skull base to thigh after the radiotracer. CT data was obtained and used for attenuation correction and anatomic localization. FASTING BLOOD GLUCOSE:  Value: 75 mg/dl COMPARISON:  11/11/2015 CT abdomen/ pelvis. FINDINGS: NECK No hypermetabolic lymph nodes in the neck. Relatively symmetric uptake in the glottis is probably physiologic. CHEST No hypermetabolic axillary, mediastinal or hilar nodes. Coarsely calcified granulomatous left hilar node. Atherosclerotic nonaneurysmal thoracic aorta. Dilated main pulmonary artery (3.3 cm diameter). Mild-to-moderate centrilobular emphysema and diffuse bronchial wall thickening. No acute consolidative airspace disease, significant pulmonary nodules or lung masses. Subpleural reticulation throughout both lungs is nonspecific and not appreciably changed. ABDOMEN/PELVIS Hypermetabolic anal mass measuring approximately 3.9 x 3.1 cm (series 3/image 249) with max SUV 15.8. No hypermetabolic inguinal or other pelvic lymph nodes. No hypermetabolic abdominal lymph nodes. No abnormal hypermetabolic activity within the liver, pancreas, adrenal glands, or spleen. Ectatic atherosclerotic infrarenal abdominal aorta, maximum diameter 2.6 cm. Mild distal colonic diverticulosis. SKELETON No focal hypermetabolic activity to suggest skeletal metastasis. IMPRESSION: 1. Hypermetabolic anal mass, in keeping with primary anal carcinoma. 2. No  hypermetabolic locoregional nodal, liver or distant metastases. 3. Mild to moderate emphysema and diffuse bronchial wall thickening, suggesting COPD. 4. Dilated main pulmonary artery, suggesting pulmonary arterial hypertension. 5. Subpleural reticulation throughout both lungs, nonspecific, cannot exclude interstitial lung disease. Consider correlation with high-resolution chest CT in 6-12 months if clinically warranted. Electronically Signed   By: Ilona Sorrel M.D.   On: 11/21/2015 12:49    ASSESSMENT: Stage II (T2,N0,M0) squamous cell carcinoma the anus.  PLAN:    1. Anal carcinoma: CT and PET results reviewed independently and reported as above with no suggestion of local regional or distant disease. Patient had consultation with gynecology oncology today which ruled out any obvious fistula. She was also seen by radiation oncology. Initial plan was to do concurrent chemotherapy with radiation using capecitabine and mitomycin. Unfortunately, patient could not afford her co-pay for capecitabine therefore she will have port placement and received 5-FU pump on  days 1 through 4 as well as days 25-29. Return to clinic in 1-2 weeks for further evaluation and initiation of day 1 of treatment.  Approximately 30 minutes was spent in discussion of which greater than 50% was consultation.  Patient expressed understanding and was in agreement with this plan. She also understands that She can call clinic at any time with any questions, concerns, or complaints.    Lloyd Huger, MD   12/05/2015 3:04 PM

## 2015-12-07 ENCOUNTER — Encounter: Payer: Self-pay | Admitting: Internal Medicine

## 2015-12-08 ENCOUNTER — Ambulatory Visit: Payer: Medicare PPO

## 2015-12-08 DIAGNOSIS — C210 Malignant neoplasm of anus, unspecified: Secondary | ICD-10-CM

## 2015-12-08 NOTE — Progress Notes (Signed)
  Oncology Nurse Navigator Documentation  Navigator Location: [facility] (12/08/15 1100)                                            Time Spent with Patient: 30 (12/08/15 1100)   Slides from Colonoscopy were requested and received. Orders entered and sent to pathology for review.

## 2015-12-09 ENCOUNTER — Ambulatory Visit: Payer: Self-pay

## 2015-12-09 ENCOUNTER — Ambulatory Visit: Payer: Medicare PPO

## 2015-12-09 ENCOUNTER — Other Ambulatory Visit: Payer: Self-pay

## 2015-12-09 ENCOUNTER — Other Ambulatory Visit: Payer: Self-pay | Admitting: *Deleted

## 2015-12-09 ENCOUNTER — Ambulatory Visit: Payer: Self-pay | Admitting: Oncology

## 2015-12-09 ENCOUNTER — Telehealth: Payer: Self-pay | Admitting: Surgery

## 2015-12-09 ENCOUNTER — Inpatient Hospital Stay: Payer: Medicare PPO

## 2015-12-09 ENCOUNTER — Other Ambulatory Visit: Payer: Medicare PPO

## 2015-12-09 DIAGNOSIS — C210 Malignant neoplasm of anus, unspecified: Secondary | ICD-10-CM

## 2015-12-09 LAB — SLIDE CONSULT, PATHOLOGY ARMC

## 2015-12-09 NOTE — Telephone Encounter (Signed)
Pt advised of pre op date/time and sx date. Sx: 12/10/15 with Dr Jeremy Johann Placement. Pre op: arrive the day of surgery @ 6:30am.

## 2015-12-10 ENCOUNTER — Encounter: Payer: Self-pay | Admitting: *Deleted

## 2015-12-10 ENCOUNTER — Ambulatory Visit: Payer: Medicare PPO

## 2015-12-10 ENCOUNTER — Ambulatory Visit: Payer: Medicare PPO | Admitting: Certified Registered Nurse Anesthetist

## 2015-12-10 ENCOUNTER — Encounter
Admission: RE | Disposition: A | Discharge status: Home / Self Care | Payer: Self-pay | Source: Ambulatory Visit | Attending: Surgery

## 2015-12-10 ENCOUNTER — Ambulatory Visit
Admission: RE | Admit: 2015-12-10 | Discharge: 2015-12-10 | Disposition: A | Discharge status: Home / Self Care | Payer: Medicare PPO | Source: Ambulatory Visit | Attending: Surgery | Admitting: Surgery

## 2015-12-10 ENCOUNTER — Other Ambulatory Visit: Payer: Self-pay

## 2015-12-10 DIAGNOSIS — Z885 Allergy status to narcotic agent status: Secondary | ICD-10-CM | POA: Insufficient documentation

## 2015-12-10 DIAGNOSIS — Z886 Allergy status to analgesic agent status: Secondary | ICD-10-CM | POA: Diagnosis not present

## 2015-12-10 DIAGNOSIS — F17210 Nicotine dependence, cigarettes, uncomplicated: Secondary | ICD-10-CM | POA: Diagnosis not present

## 2015-12-10 DIAGNOSIS — I2510 Atherosclerotic heart disease of native coronary artery without angina pectoris: Secondary | ICD-10-CM | POA: Insufficient documentation

## 2015-12-10 DIAGNOSIS — C7982 Secondary malignant neoplasm of genital organs: Secondary | ICD-10-CM | POA: Insufficient documentation

## 2015-12-10 DIAGNOSIS — C210 Malignant neoplasm of anus, unspecified: Secondary | ICD-10-CM | POA: Diagnosis not present

## 2015-12-10 DIAGNOSIS — I10 Essential (primary) hypertension: Secondary | ICD-10-CM | POA: Insufficient documentation

## 2015-12-10 DIAGNOSIS — Z7951 Long term (current) use of inhaled steroids: Secondary | ICD-10-CM | POA: Diagnosis not present

## 2015-12-10 DIAGNOSIS — C211 Malignant neoplasm of anal canal: Secondary | ICD-10-CM | POA: Diagnosis present

## 2015-12-10 DIAGNOSIS — M5136 Other intervertebral disc degeneration, lumbar region: Secondary | ICD-10-CM | POA: Insufficient documentation

## 2015-12-10 DIAGNOSIS — I252 Old myocardial infarction: Secondary | ICD-10-CM | POA: Insufficient documentation

## 2015-12-10 DIAGNOSIS — R42 Dizziness and giddiness: Secondary | ICD-10-CM | POA: Insufficient documentation

## 2015-12-10 DIAGNOSIS — Z95828 Presence of other vascular implants and grafts: Secondary | ICD-10-CM

## 2015-12-10 DIAGNOSIS — Z807 Family history of other malignant neoplasms of lymphoid, hematopoietic and related tissues: Secondary | ICD-10-CM | POA: Insufficient documentation

## 2015-12-10 DIAGNOSIS — Z8249 Family history of ischemic heart disease and other diseases of the circulatory system: Secondary | ICD-10-CM | POA: Insufficient documentation

## 2015-12-10 DIAGNOSIS — E785 Hyperlipidemia, unspecified: Secondary | ICD-10-CM | POA: Diagnosis not present

## 2015-12-10 DIAGNOSIS — Z803 Family history of malignant neoplasm of breast: Secondary | ICD-10-CM | POA: Insufficient documentation

## 2015-12-10 DIAGNOSIS — J439 Emphysema, unspecified: Secondary | ICD-10-CM | POA: Insufficient documentation

## 2015-12-10 DIAGNOSIS — Q600 Renal agenesis, unilateral: Secondary | ICD-10-CM | POA: Insufficient documentation

## 2015-12-10 DIAGNOSIS — Z510 Encounter for antineoplastic radiation therapy: Secondary | ICD-10-CM | POA: Diagnosis not present

## 2015-12-10 DIAGNOSIS — Z419 Encounter for procedure for purposes other than remedying health state, unspecified: Secondary | ICD-10-CM

## 2015-12-10 DIAGNOSIS — Z79899 Other long term (current) drug therapy: Secondary | ICD-10-CM | POA: Insufficient documentation

## 2015-12-10 HISTORY — PX: PORTACATH PLACEMENT: SHX2246

## 2015-12-10 SURGERY — INSERTION, TUNNELED CENTRAL VENOUS DEVICE, WITH PORT
Anesthesia: General

## 2015-12-10 MED ORDER — TRAMADOL-ACETAMINOPHEN 37.5-325 MG PO TABS: 1.0000 | ORAL_TABLET | ORAL | Status: DC | PRN

## 2015-12-10 MED ORDER — CEFAZOLIN SODIUM-DEXTROSE 2-3 GM-% IV SOLR
INTRAVENOUS | Status: DC | PRN
  Administered 2015-12-10: 2 g via INTRAVENOUS

## 2015-12-10 MED ORDER — BUPIVACAINE-EPINEPHRINE (PF) 0.25% -1:200000 IJ SOLN
INTRAMUSCULAR | Status: DC | PRN
  Administered 2015-12-10: 10 mL

## 2015-12-10 MED ORDER — FAMOTIDINE 20 MG PO TABS
ORAL_TABLET | ORAL | Status: AC
  Administered 2015-12-10: 20 mg via ORAL
  Filled 2015-12-10: qty 1

## 2015-12-10 MED ORDER — LIDOCAINE-PRILOCAINE 2.5-2.5 % EX CREA: 1.0000 "application " | TOPICAL_CREAM | CUTANEOUS | Status: DC | PRN

## 2015-12-10 MED ORDER — SODIUM CHLORIDE 0.9 % IV SOLN
500.0000 mL | INTRAVENOUS | Status: DC
  Administered 2015-12-10: 1000 mL via INTRAVENOUS

## 2015-12-10 MED ORDER — PROPOFOL 10 MG/ML IV BOLUS
INTRAVENOUS | Status: DC | PRN
  Administered 2015-12-10: 5 mg via INTRAVENOUS
  Administered 2015-12-10: 10 mg via INTRAVENOUS

## 2015-12-10 MED ORDER — BUPIVACAINE-EPINEPHRINE (PF) 0.25% -1:200000 IJ SOLN
INTRAMUSCULAR | Status: AC
  Filled 2015-12-10: qty 30

## 2015-12-10 MED ORDER — FAMOTIDINE 20 MG PO TABS
20.0000 mg | ORAL_TABLET | Freq: Once | ORAL | Status: AC
  Administered 2015-12-10: 20 mg via ORAL

## 2015-12-10 MED ORDER — IPRATROPIUM-ALBUTEROL 0.5-2.5 (3) MG/3ML IN SOLN
3.0000 mL | Freq: Once | RESPIRATORY_TRACT | Status: AC
  Administered 2015-12-10: 3 mL via RESPIRATORY_TRACT

## 2015-12-10 MED ORDER — HEPARIN SODIUM (PORCINE) 5000 UNIT/ML IJ SOLN
INTRAMUSCULAR | Status: AC
  Filled 2015-12-10: qty 1

## 2015-12-10 MED ORDER — LIDOCAINE HCL (PF) 1 % IJ SOLN
INTRAMUSCULAR | Status: AC
  Filled 2015-12-10: qty 30

## 2015-12-10 MED ORDER — ONDANSETRON HCL 4 MG/2ML IJ SOLN
4.0000 mg | Freq: Once | INTRAMUSCULAR | Status: DC | PRN
Start: 2015-12-10 — End: 2015-12-10

## 2015-12-10 MED ORDER — FENTANYL CITRATE (PF) 100 MCG/2ML IJ SOLN
25.0000 ug | INTRAMUSCULAR | Status: DC | PRN
  Administered 2015-12-10 (×4): 25 ug via INTRAVENOUS

## 2015-12-10 MED ORDER — MIDAZOLAM HCL 2 MG/2ML IJ SOLN
INTRAMUSCULAR | Status: DC | PRN
  Administered 2015-12-10: 1 mg via INTRAVENOUS
  Administered 2015-12-10: 0.5 mg via INTRAVENOUS

## 2015-12-10 MED ORDER — FENTANYL CITRATE (PF) 100 MCG/2ML IJ SOLN
INTRAMUSCULAR | Status: AC
  Filled 2015-12-10: qty 2

## 2015-12-10 MED ORDER — SODIUM CHLORIDE 0.9 % IV SOLN
INTRAVENOUS | Status: DC | PRN
  Administered 2015-12-10: 8 mL via INTRAMUSCULAR

## 2015-12-10 MED ORDER — LIDOCAINE HCL (PF) 1 % IJ SOLN
INTRAMUSCULAR | Status: DC | PRN
  Administered 2015-12-10: 10 mL

## 2015-12-10 MED ORDER — LIDOCAINE HCL (CARDIAC) 20 MG/ML IV SOLN
INTRAVENOUS | Status: DC | PRN
  Administered 2015-12-10: 40 mg via INTRAVENOUS

## 2015-12-10 MED ORDER — IPRATROPIUM-ALBUTEROL 0.5-2.5 (3) MG/3ML IN SOLN
RESPIRATORY_TRACT | Status: AC
  Administered 2015-12-10: 3 mL via RESPIRATORY_TRACT
  Filled 2015-12-10: qty 3

## 2015-12-10 SURGICAL SUPPLY — 33 items
BAG DECANTER FOR FLEXI CONT (MISCELLANEOUS) ×3 IMPLANT
BLADE SURG SZ11 CARB STEEL (BLADE) ×3 IMPLANT
CANISTER SUCT 1200ML W/VALVE (MISCELLANEOUS) ×3 IMPLANT
CHLORAPREP W/TINT 26ML (MISCELLANEOUS) ×3 IMPLANT
DECANTER SPIKE VIAL GLASS SM (MISCELLANEOUS) ×6 IMPLANT
DRAPE C-ARM XRAY 36X54 (DRAPES) ×3 IMPLANT
DRAPE INCISE IOBAN 66X45 STRL (DRAPES) ×3 IMPLANT
GEL ULTRASOUND 20GR AQUASONIC (MISCELLANEOUS) ×5 IMPLANT
GLOVE BIO SURGEON STRL SZ7 (GLOVE) ×5 IMPLANT
GLOVE INDICATOR 7.5 STRL GRN (GLOVE) ×2 IMPLANT
GOWN STRL REUS W/ TWL LRG LVL3 (GOWN DISPOSABLE) ×2 IMPLANT
GOWN STRL REUS W/TWL LRG LVL3 (GOWN DISPOSABLE) ×6
IV NS 500ML (IV SOLUTION) ×3
IV NS 500ML BAXH (IV SOLUTION) ×1 IMPLANT
KIT PORT POWER 8FR ISP CVUE (Catheter) ×3 IMPLANT
LIQUID BAND (GAUZE/BANDAGES/DRESSINGS) ×3 IMPLANT
NDL HYPO 18GX1.5 BLUNT FILL (NEEDLE) IMPLANT
NDL HYPO 25X1 1.5 SAFETY (NEEDLE) ×1 IMPLANT
NEEDLE HYPO 18GX1.5 BLUNT FILL (NEEDLE) ×3 IMPLANT
NEEDLE HYPO 25X1 1.5 SAFETY (NEEDLE) ×3 IMPLANT
NS IRRIG 1000ML POUR BTL (IV SOLUTION) ×1 IMPLANT
PACK PORT-A-CATH (MISCELLANEOUS) ×3 IMPLANT
PAD GROUND ADULT SPLIT (MISCELLANEOUS) ×3 IMPLANT
SPONGE LAP 18X18 5 PK (GAUZE/BANDAGES/DRESSINGS) ×3 IMPLANT
SUT MNCRL AB 4-0 PS2 18 (SUTURE) ×3 IMPLANT
SUT PROLENE 2 0 CT2 30 (SUTURE) ×3 IMPLANT
SUT VIC AB 3-0 SH 27 (SUTURE) ×3
SUT VIC AB 3-0 SH 27X BRD (SUTURE) ×1 IMPLANT
SYR 20CC LL (SYRINGE) ×3 IMPLANT
SYR 3ML LL SCALE MARK (SYRINGE) ×2 IMPLANT
SYR 5ML LL (SYRINGE) ×3 IMPLANT
TOWEL OR 17X26 4PK STRL BLUE (TOWEL DISPOSABLE) ×3 IMPLANT
TRANSDUCER COVER ×2 IMPLANT

## 2015-12-10 NOTE — H&P (View-Only) (Signed)
Carol Gay 's following up after incisional biopsy of the anorectal mass. 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 She also had confirmatory colonoscopy showing the exact pathology. CT scan of the abdomen and pelvis showed no evidence of any other metastatic disease and chest x-ray did not show any evidence of metastasis. She continues to have some intermittent rectal pain. She is not obstructed she is having multiple bowel movements as per colonoscopy polyps also were removed. Further workup included a CEA that was normal and HIV test was normal. Cousin with the patient in detail about her new diagnosis. Mild intermittent hematochezia. CT scan reviewed personally, evidence of metastatic disease and ill defined anorectal lesion. She  Will benefit from MRI given the location  PE: No acute distress awake alert, nontoxic she is accompanied by family and friends Chest: CTA, NSR Abd: Soft, nontender, no masses no peritonitis Inguinal Area: No evidence of lymphadenopathy. Pelvic: Manual examination shows obvious evidence of the posterior wall of the vagina about 3 cm from the introitus. This is a heart lesion and likely coming from the rectum is not obvious that there is an mucosal ulceration on the vagina or primary from the vagina. Rectal: Recent anal canal lesion on the right anterolateral aspect extending into the anterior midline and involving the posterior wall of the vagina. Externally there is no evidence of an anal margin carcinoma. 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 Clinically no evidence of metastatic disease. MRI w constrast to eval extend, no evidence of inguinal involvement We will refer to: Medical oncology, radiation oncology, gynecological oncology for evaluation of vaginal involvement. IT would require  PET/CT for restaging Make sure that we will discuss her case on 2 more before Did gave her extensive counseling and also gave her the option of seeking a second opinion with a colorectal surgeon either at [facility]. For now she wishes to seek medical and radiation oncology first. Likely benefit from chemoradiation therapy. We'll be happy to place a port placement. The plans pending on consultations and depending on the incision and 2 more port Sensitive counseling provided and all questions were answered

## 2015-12-10 NOTE — Transfer of Care (Signed)
Immediate Anesthesia Transfer of Care Note  Patient: Carol Gay  Procedure(s) Performed: Procedure(s): INSERTION PORT-A-CATH (N/A)  Patient Location: PACU  Anesthesia Type:MAC  Level of Consciousness: awake, alert  and oriented  Airway & Oxygen Therapy: Patient Spontanous Breathing and Patient connected to face mask oxygen  Post-op Assessment: Report given to RN and Post -op Vital signs reviewed and stable  Post vital signs: Reviewed and stable  Last Vitals: 0848- 90 hr 100% sat 18 resp 137/89 bp temp 98.3  Filed Vitals:   12/10/15 0714 12/10/15 0845  BP: 168/69   Pulse: 96   Temp: 36.7 C 36.2 C  Resp: 20     Complications: No apparent anesthesia complications

## 2015-12-10 NOTE — Progress Notes (Signed)
Ultracet did not make it to the pharmacy from Dr. Dahlia Byes. Medication called in at this time to patient's preferred pharmacy.

## 2015-12-10 NOTE — Anesthesia Preprocedure Evaluation (Addendum)
Anesthesia Evaluation  Patient identified by MRN, date of birth, ID band Patient awake    Reviewed: Allergy & Precautions, NPO status , Patient's Chart, lab work & pertinent test results  History of Anesthesia Complications Negative for: history of anesthetic complications  Airway Mallampati: III       Dental   Pulmonary neg pulmonary ROS, shortness of breath, asthma , COPD,  COPD inhaler, Current Smoker,           Cardiovascular hypertension, + CAD, + Past MI and + Peripheral Vascular Disease  + Valvular Problems/Murmurs MVP      Neuro/Psych Anxiety negative neurological ROS     GI/Hepatic negative GI ROS, Neg liver ROS, GERD  Medicated and Poorly Controlled,  Endo/Other  negative endocrine ROS  Renal/GU Renal disease (single kidney at birth)negative Renal ROS     Musculoskeletal  (+) Arthritis , Osteoarthritis,    Abdominal   Peds  Hematology negative hematology ROS (+)   Anesthesia Other Findings   Reproductive/Obstetrics                            Anesthesia Physical Anesthesia Plan  ASA: III  Anesthesia Plan: General   Post-op Pain Management:    Induction: Intravenous  Airway Management Planned:   Additional Equipment:   Intra-op Plan:   Post-operative Plan:   Informed Consent: I have reviewed the patients History and Physical, chart, labs and discussed the procedure including the risks, benefits and alternatives for the proposed anesthesia with the patient or authorized representative who has indicated his/her understanding and acceptance.     Plan Discussed with:   Anesthesia Plan Comments:         Anesthesia Quick Evaluation

## 2015-12-10 NOTE — Op Note (Signed)
  Pre-operative Diagnosis: Anal Canal SCC  Post-operative Diagnosis: Same   Surgeon: Caroleen Hamman, MD FACS  Anesthesia: IV sedation, marcaine .25% w epi and lidocaine 1%  Procedure: Right IJ  Port placement with fluoroscopy and under U/S guidance  Findings: Good position of the tip of the catheter by fluoroscopy  Estimated Blood Loss: Minimal         Drains: None         Specimens: None       Complications: None          Procedure Details  The patient was seen again in the Holding Room. The benefits, complications, treatment options, and expected outcomes were discussed with the patient. The risks of bleeding, infection, recurrence of symptoms, failure to resolve symptoms,  thrombosis nonfunction breakage pneumothorax hemopneumothorax any of which could require chest tube or further surgery were reviewed with the patient.   The patient was taken to Operating Room, identified as Carol Gay and the procedure verified.  A Time Out was held and the above information confirmed.  Prior to the induction of general anesthesia, antibiotic prophylaxis was administered. VTE prophylaxis was in place. Appropriate anesthesia was then administered and tolerated well. The chest was prepped with Chloraprep and draped in the sterile fashion. The patient was positioned in the supine position. Then the patient was placed in Trendelenburg position.  Patient was prepped and draped in sterile fashion and in a Trendelenburg position local anesthetic was infiltrated into the skin and subcutaneous tissues in the neck and anterior chest wall. The large bore needle was placed into the internal jugular vein under U/S guidance without difficulty and then the Seldinger wire was advanced. Fluoroscopy was utilized to confirm that the Seldinger wire was in the superior vena cava.  An incision was made and a port pocket developed with blunt and electrocautery dissection. The introducer dilator was placed over the  Seldinger wire the wire was removed. The previously flushed catheter was placed into the introducer dilator and the peel-away sheath was removed. The catheter length was confirmed and trimmed utilizing fluoroscopy for proper positioning. The catheter was then attached to the previously flushed port. The port was placed into the pocket. The port was held in with 2-0 Prolenes and flushed for function and heparin locked.  The wound was closed with interrupted 3-0 Vicryl followed by 4-0 subcuticular Monocryl sutures. Dermabond used to coat the skin  Patient was taken to the recovery room in stable condition where a postoperative chest film has been ordered.

## 2015-12-10 NOTE — Anesthesia Postprocedure Evaluation (Signed)
Anesthesia Post Note  Patient: Carol Gay  Procedure(s) Performed: Procedure(s) (LRB): INSERTION PORT-A-CATH (N/A)  Patient location during evaluation: PACU Anesthesia Type: General Level of consciousness: awake and alert Pain management: pain level controlled Vital Signs Assessment: post-procedure vital signs reviewed and stable Respiratory status: spontaneous breathing and respiratory function stable Cardiovascular status: stable Anesthetic complications: no    Last Vitals:  Filed Vitals:   12/10/15 0955 12/10/15 1030  BP: 119/56 116/62  Pulse: 92 89  Temp:    Resp: 18 18    Last Pain:  Filed Vitals:   12/10/15 1037  PainSc: 9                  KEPHART,WILLIAM K

## 2015-12-10 NOTE — Discharge Instructions (Signed)
Implanted Port Home Guide °An implanted port is a type of central line that is placed under the skin. Central lines are used to provide IV access when treatment or nutrition needs to be given through a person's veins. Implanted ports are used for long-term IV access. An implanted port may be placed because:  °· You need IV medicine that would be irritating to the small veins in your hands or arms.   °· You need long-term IV medicines, such as antibiotics.   °· You need IV nutrition for a long period.   °· You need frequent blood draws for lab tests.   °· You need dialysis.   °Implanted ports are usually placed in the chest area, but they can also be placed in the upper arm, the abdomen, or the leg. An implanted port has two main parts:  °· Reservoir. The reservoir is round and will appear as a small, raised area under your skin. The reservoir is the part where a needle is inserted to give medicines or draw blood.   °· Catheter. The catheter is a thin, flexible tube that extends from the reservoir. The catheter is placed into a large vein. Medicine that is inserted into the reservoir goes into the catheter and then into the vein.   °HOW WILL I CARE FOR MY INCISION SITE? °Do not get the incision site wet. Bathe or shower as directed by your health care provider.  °HOW IS MY PORT ACCESSED? °Special steps must be taken to access the port:  °· Before the port is accessed, a numbing cream can be placed on the skin. This helps numb the skin over the port site.   °· Your health care provider uses a sterile technique to access the port. °· Your health care provider must put on a mask and sterile gloves. °· The skin over your port is cleaned carefully with an antiseptic and allowed to dry. °· The port is gently pinched between sterile gloves, and a needle is inserted into the port. °· Only "non-coring" port needles should be used to access the port. Once the port is accessed, a blood return should be checked. This helps  ensure that the port is in the vein and is not clogged.   °· If your port needs to remain accessed for a constant infusion, a clear (transparent) bandage will be placed over the needle site. The bandage and needle will need to be changed every week, or as directed by your health care provider.   °· Keep the bandage covering the needle clean and dry. Do not get it wet. Follow your health care provider's instructions on how to take a shower or bath while the port is accessed.   °· If your port does not need to stay accessed, no bandage is needed over the port.   °WHAT IS FLUSHING? °Flushing helps keep the port from getting clogged. Follow your health care provider's instructions on how and when to flush the port. Ports are usually flushed with saline solution or a medicine called heparin. The need for flushing will depend on how the port is used.  °· If the port is used for intermittent medicines or blood draws, the port will need to be flushed:   °· After medicines have been given.   °· After blood has been drawn.   °· As part of routine maintenance.   °· If a constant infusion is running, the port may not need to be flushed.   °HOW LONG WILL MY PORT STAY IMPLANTED? °The port can stay in for as long as your health care   provider thinks it is needed. When it is time for the port to come out, surgery will be done to remove it. The procedure is similar to the one performed when the port was put in.  WHEN SHOULD I SEEK IMMEDIATE MEDICAL CARE? When you have an implanted port, you should seek immediate medical care if:   You notice a bad smell coming from the incision site.   You have swelling, redness, or drainage at the incision site.   You have more swelling or pain at the port site or the surrounding area.   You have a fever that is not controlled with medicine.   This information is not intended to replace advice given to you by your health care provider. Make sure you discuss any questions you have with  your health care provider.   Document Released: 09/06/2005 Document Revised: 06/27/2013 Document Reviewed: 05/14/2013 Elsevier Interactive Patient Education 2016 [place] Anesthesia, Adult, Care After Refer to this sheet in the next few weeks. These instructions provide you with information on caring for yourself after your procedure. Your health care provider may also give you more specific instructions. Your treatment has been planned according to current medical practices, but problems sometimes occur. Call your health care provider if you have any problems or questions after your procedure. WHAT TO EXPECT AFTER THE PROCEDURE After the procedure, it is typical to experience:  Sleepiness.  Nausea and vomiting. HOME CARE INSTRUCTIONS  For the first 24 hours after general anesthesia:  Have a responsible person with you.  Do not drive a car. If you are alone, do not take public transportation.  Do not drink alcohol.  Do not take medicine that has not been prescribed by your health care provider.  Do not sign important papers or make important decisions.  You may resume a normal diet and activities as directed by your health care provider.  Change bandages (dressings) as directed.  If you have questions or problems that seem related to general anesthesia, call the hospital and ask for the anesthetist or anesthesiologist on call. SEEK MEDICAL CARE IF:  You have nausea and vomiting that continue the day after anesthesia.  You develop a rash. SEEK IMMEDIATE MEDICAL CARE IF:   You have difficulty breathing.  You have chest pain.  You have any allergic problems.   This information is not intended to replace advice given to you by your health care provider. Make sure you discuss any questions you have with your health care provider.   Document Released: 12/13/2000 Document Revised: 09/27/2014 Document Reviewed: 01/05/2012 Elsevier Interactive Patient Education  [employer].

## 2015-12-10 NOTE — Interval H&P Note (Signed)
History and Physical Interval Note:  12/10/2015 7:11 AM  Carol Gay  has presented today for surgery, with the diagnosis of anal cancer  The various methods of treatment have been discussed with the patient and family. After consideration of risks ( including but not limited to PTX, vascular injury, infection and bleeding), benefits and other options for treatment, the patient has consented to  Procedure(s): INSERTION PORT-A-CATH (N/A) as a surgical intervention .  The patient's history has been reviewed, patient examined, no change in status, stable for surgery.  I have reviewed the patient's chart and labs.  Questions were answered to the patient's satisfaction.     [place]

## 2015-12-11 ENCOUNTER — Other Ambulatory Visit: Payer: Self-pay

## 2015-12-11 ENCOUNTER — Telehealth: Payer: Self-pay

## 2015-12-11 ENCOUNTER — Ambulatory Visit: Payer: Self-pay | Admitting: Oncology

## 2015-12-11 ENCOUNTER — Ambulatory Visit
Admission: RE | Admit: 2015-12-11 | Discharge: 2015-12-11 | Disposition: A | Discharge status: Home / Self Care | Payer: Medicare PPO | Source: Ambulatory Visit | Attending: Radiation Oncology | Admitting: Radiation Oncology

## 2015-12-11 ENCOUNTER — Ambulatory Visit
Admission: RE | Admit: 2015-12-11 | Discharge: 2015-12-11 | Disposition: A | Discharge status: Home / Self Care | Payer: Medicare PPO | Source: Ambulatory Visit | Attending: Surgery | Admitting: Surgery

## 2015-12-11 DIAGNOSIS — C210 Malignant neoplasm of anus, unspecified: Secondary | ICD-10-CM | POA: Insufficient documentation

## 2015-12-11 DIAGNOSIS — Z510 Encounter for antineoplastic radiation therapy: Secondary | ICD-10-CM | POA: Diagnosis not present

## 2015-12-11 MED ORDER — GADOBENATE DIMEGLUMINE 529 MG/ML IV SOLN: 20.0000 mL | Freq: Once | INTRAVENOUS | Status: DC | PRN

## 2015-12-11 NOTE — Telephone Encounter (Signed)
  Oncology Nurse Navigator Documentation  Navigator Location: [facility] (12/11/15 0900) Navigator Encounter Type: Telephone (12/11/15 0900) Telephone: Appt Confirmation/Clarification;Diagnostic Results (12/11/15 0900)                                        Time Spent with Patient: 30 (12/11/15 0900)   Received call from Carol Gay regarding appt for radiation. Made her aware that it is today at 1315. I also made her aware of her HPV positive pap results and that she will need colposcopy after her treatment is completed for anal cancer. Read back performed.

## 2015-12-12 ENCOUNTER — Ambulatory Visit: Admission: RE | Admit: 2015-12-12 | Payer: Medicare PPO | Source: Ambulatory Visit

## 2015-12-12 DIAGNOSIS — C210 Malignant neoplasm of anus, unspecified: Secondary | ICD-10-CM

## 2015-12-12 NOTE — Progress Notes (Signed)
  Oncology Nurse Navigator Documentation  Navigator Location: [facility] (12/12/15 1000)                                            Time Spent with Patient: 15 (12/12/15 1000) Slides received from [place] for pathology review of the rectal biopsy performed by Dr Dahlia Byes. Sent to pathology

## 2015-12-15 ENCOUNTER — Inpatient Hospital Stay: Payer: Medicare PPO

## 2015-12-15 ENCOUNTER — Ambulatory Visit: Payer: Self-pay | Admitting: Hematology and Oncology

## 2015-12-15 ENCOUNTER — Ambulatory Visit
Admission: RE | Admit: 2015-12-15 | Discharge: 2015-12-15 | Disposition: A | Discharge status: Home / Self Care | Payer: Medicare PPO | Source: Ambulatory Visit | Attending: Radiation Oncology | Admitting: Radiation Oncology

## 2015-12-15 ENCOUNTER — Inpatient Hospital Stay (HOSPITAL_BASED_OUTPATIENT_CLINIC_OR_DEPARTMENT_OTHER): Payer: Medicare PPO | Admitting: Family Medicine

## 2015-12-15 VITALS — BP 139/84 | HR 86 | Temp 96.7°F | Resp 20 | Wt 168.9 lb

## 2015-12-15 DIAGNOSIS — R938 Abnormal findings on diagnostic imaging of other specified body structures: Secondary | ICD-10-CM | POA: Diagnosis not present

## 2015-12-15 DIAGNOSIS — C210 Malignant neoplasm of anus, unspecified: Secondary | ICD-10-CM

## 2015-12-15 DIAGNOSIS — Z79899 Other long term (current) drug therapy: Secondary | ICD-10-CM

## 2015-12-15 DIAGNOSIS — E785 Hyperlipidemia, unspecified: Secondary | ICD-10-CM

## 2015-12-15 DIAGNOSIS — F17210 Nicotine dependence, cigarettes, uncomplicated: Secondary | ICD-10-CM | POA: Diagnosis not present

## 2015-12-15 DIAGNOSIS — Z510 Encounter for antineoplastic radiation therapy: Secondary | ICD-10-CM | POA: Diagnosis not present

## 2015-12-15 DIAGNOSIS — C801 Malignant (primary) neoplasm, unspecified: Secondary | ICD-10-CM

## 2015-12-15 DIAGNOSIS — I2510 Atherosclerotic heart disease of native coronary artery without angina pectoris: Secondary | ICD-10-CM

## 2015-12-15 DIAGNOSIS — I252 Old myocardial infarction: Secondary | ICD-10-CM | POA: Diagnosis not present

## 2015-12-15 DIAGNOSIS — C211 Malignant neoplasm of anal canal: Secondary | ICD-10-CM | POA: Diagnosis not present

## 2015-12-15 DIAGNOSIS — Z5111 Encounter for antineoplastic chemotherapy: Secondary | ICD-10-CM | POA: Diagnosis not present

## 2015-12-15 DIAGNOSIS — M5136 Other intervertebral disc degeneration, lumbar region: Secondary | ICD-10-CM

## 2015-12-15 DIAGNOSIS — K6289 Other specified diseases of anus and rectum: Secondary | ICD-10-CM

## 2015-12-15 DIAGNOSIS — J449 Chronic obstructive pulmonary disease, unspecified: Secondary | ICD-10-CM

## 2015-12-15 DIAGNOSIS — I10 Essential (primary) hypertension: Secondary | ICD-10-CM

## 2015-12-15 LAB — COMPREHENSIVE METABOLIC PANEL
ALT: 14 U/L (ref 14–54)
AST: 21 U/L (ref 15–41)
Albumin: 4 g/dL (ref 3.5–5.0)
Alkaline Phosphatase: 84 U/L (ref 38–126)
Anion gap: 5 (ref 5–15)
BUN: 15 mg/dL (ref 6–20)
CHLORIDE: 101 mmol/L (ref 101–111)
CO2: 29 mmol/L (ref 22–32)
CREATININE: 0.72 mg/dL (ref 0.44–1.00)
Calcium: 9.7 mg/dL (ref 8.9–10.3)
GFR calc non Af Amer: >60 mL/min (ref >60)
Glucose, Bld: 103 mg/dL — ABNORMAL HIGH (ref 65–99)
POTASSIUM: 3.7 mmol/L (ref 3.5–5.1)
SODIUM: 135 mmol/L (ref 135–145)
Total Bilirubin: 0.4 mg/dL (ref 0.3–1.2)
Total Protein: 8.2 g/dL — ABNORMAL HIGH (ref 6.5–8.1)

## 2015-12-15 LAB — CBC WITH DIFFERENTIAL/PLATELET
BASOS ABS: 0.1 10*3/uL (ref 0–0.1)
Basophils Relative: 2 %
EOS ABS: 0.3 10*3/uL (ref 0–0.7)
EOS PCT: 4 %
HCT: 44.2 % (ref 35.0–47.0)
Hemoglobin: 15.1 g/dL (ref 12.0–16.0)
Lymphocytes Relative: 29 %
Lymphs Abs: 2 10*3/uL (ref 1.0–3.6)
MCH: 29.5 pg (ref 26.0–34.0)
MCHC: 34.2 g/dL (ref 32.0–36.0)
MCV: 86.3 fL (ref 80.0–100.0)
Monocytes Absolute: 0.6 10*3/uL (ref 0.2–0.9)
Monocytes Relative: 9 %
Neutro Abs: 3.8 10*3/uL (ref 1.4–6.5)
Neutrophils Relative %: 56 %
PLATELETS: 216 10*3/uL (ref 150–440)
RBC: 5.13 MIL/uL (ref 3.80–5.20)
RDW: 13.9 % (ref 11.5–14.5)
WBC: 6.8 10*3/uL (ref 3.6–11.0)

## 2015-12-15 LAB — SLIDE CONSULT, PATHOLOGY ARMC

## 2015-12-15 MED ORDER — SODIUM CHLORIDE 0.9 % IV SOLN
Freq: Once | INTRAVENOUS | Status: AC
  Administered 2015-12-15: 12:00:00 via INTRAVENOUS
  Filled 2015-12-15: qty 1000

## 2015-12-15 MED ORDER — SODIUM CHLORIDE 0.9 % IV SOLN
1000.0000 mg/m2/d | INTRAVENOUS | Status: DC
  Administered 2015-12-15: 7350 mg via INTRAVENOUS
  Filled 2015-12-15: qty 147

## 2015-12-15 MED ORDER — MITOMYCIN CHEMO IV INJECTION 20 MG
10.0000 mg/m2 | Freq: Once | INTRAVENOUS | Status: AC
  Administered 2015-12-15: 18.5 mg via INTRAVENOUS
  Filled 2015-12-15: qty 37

## 2015-12-15 MED ORDER — SODIUM CHLORIDE 0.9% FLUSH
10.0000 mL | INTRAVENOUS | Status: DC | PRN
Start: 2015-12-15 — End: 2015-12-15
  Administered 2015-12-15: 10 mL via INTRAVENOUS
  Filled 2015-12-15: qty 10

## 2015-12-15 MED ORDER — PROCHLORPERAZINE MALEATE 10 MG PO TABS
10.0000 mg | ORAL_TABLET | Freq: Once | ORAL | Status: AC
  Administered 2015-12-15: 10 mg via ORAL
  Filled 2015-12-15: qty 1

## 2015-12-15 NOTE — Progress Notes (Signed)
Patient here today for ongoing follow up and treatment consideration regarding anal cancer. Patient denies any concerns today.

## 2015-12-15 NOTE — Progress Notes (Signed)
  Oncology Nurse Navigator Documentation                                                     Oncology Nurse Navigator Documentation  Navigator Location: [facility] (12/15/15 1100) Navigator Encounter Type: Treatment (12/15/15 1100)             Treatment Phase: First Chemo Tx (12/15/15 1100) Barriers/Navigation Needs: Financial (12/15/15 1100)   Interventions:  (Gas assistance) (12/15/15 1100)                      Time Spent with Patient: 15 (12/15/15 1100)   Receiving first chemo treatment today. Watching video for chemo pump at present. Asking for assistance with gas to get to and from daily xrt treatments. Able to provide with gas voucher per Barnabas Lister. She can pick up at her xrt appt.

## 2015-12-15 NOTE — Progress Notes (Signed)
[facility]  [phone]  [phone]     Carol Gay DOB: [birth year]  MR#: [id number]  JGG#:[id number]  Patient Care Team: Glean Hess, MD as PCP - General (Family Medicine) Clent Jacks, RN as Registered Nurse  CHIEF COMPLAINT:  Chief Complaint  Patient presents with  . OTHER    follow up for anal cancer  . Chemotherapy    first treatment    INTERVAL HISTORY:  Patient returns to clinic today for further evaluation and treatment regarding carcinoma of the anus. Patient is to begin radiation therapy with concurrent mitomycin and 5-FU chemotherapy, days 1 through 4. She continues to feel well and is asymptomatic. She does not complain of pain or bleeding at this time. She has no neurologic complaints. She has good appetite and denies weight loss. She denies any recent fevers. She has no chest pain or shortness of breath. She denies any nausea, vomiting, constipation, or diarrhea. She has no urinary complaints. Patient is mildly anxious today. Patient offers no specific complaints today.  REVIEW OF SYSTEMS:   Review of Systems  Constitutional: Positive for malaise/fatigue. Negative for fever, chills, weight loss and diaphoresis.  HENT: Negative.   Eyes: Negative.   Respiratory: Negative for cough, hemoptysis, sputum production, shortness of breath and wheezing.   Cardiovascular: Negative for chest pain, palpitations, orthopnea, claudication, leg swelling and PND.  Gastrointestinal: Positive for nausea. Negative for heartburn, vomiting, abdominal pain, diarrhea, constipation, blood in stool and melena.  Genitourinary: Negative.   Musculoskeletal: Negative.   Skin: Negative.   Neurological: Negative for dizziness, tingling, focal weakness, seizures and weakness.  Endo/Heme/Allergies: Does not bruise/bleed easily.  Psychiatric/Behavioral: Negative for depression. The patient is nervous/anxious. The patient does not have insomnia.     As  per HPI. Otherwise, a complete review of systems is negatve.   PAST MEDICAL HISTORY: Past Medical History  Diagnosis Date  . Cigarette smoker   . Hypertension   . Arm pain   . Rectal bleeding   . COPD (chronic obstructive pulmonary disease) ([place])   . Hemorrhoids   . CAD in native artery   . Pulmonary emphysema ([place])   . Dyslipidemia   . Dermatophytoses   . DDD (degenerative disc disease), lumbar        . Aortic atherosclerosis ([place])   . H/O blood clots     left leg, veins stripped  . Shortness of breath dyspnea   . Smokers' cough ([place])   . Myocardial infarction [facility])     "mild" - age 89  . Congenital absence of one kidney     born with only right kidney  . Vertigo   . Wears dentures     has full upper and lower - doesn't wear  . Asthma   . Claustrophobia     PAST SURGICAL HISTORY: Past Surgical History  Procedure Laterality Date  . Vein ligation and stripping    . Ovarian cyst removal Left   . Appendectomy    . Lumbar disc surgery  1997  . Breast biopsy    . Exploratory laparotomy    . Lesion excision  11/03/15    Anal - Dr Dahlia Byes, [facility]  . Colonoscopy with propofol N/A 11/06/2015    Procedure: COLONOSCOPY WITH PROPOFOL;  Surgeon: Lucilla Lame, MD;  Location: [place];  Service: Endoscopy;  Laterality: N/A;  LEAVE PT EARLY  . Polypectomy  11/06/2015    Procedure: POLYPECTOMY;  Surgeon: Lucilla Lame, MD;  Location:  [place];  Service: Endoscopy;;  . Portacath placement N/A 12/10/2015    Procedure: INSERTION PORT-A-CATH;  Surgeon: Jules Husbands, MD;  Location: [facility];  Service: General;  Laterality: N/A;    FAMILY HISTORY Family History  Problem Relation Age of Onset  . Cancer Mother     lymphoma  . Heart disease Mother   . Cancer Maternal Grandmother     breast    GYNECOLOGIC HISTORY:  No LMP recorded. Patient is postmenopausal.     ADVANCED DIRECTIVES:    HEALTH MAINTENANCE: Social History  Substance Use Topics  . Smoking  status: Current Every Day Smoker -- 1.00 packs/day for 57 years    Types: Cigarettes  . Smokeless tobacco: Not on file     Comment: not quite ready  . Alcohol Use: No     Allergies  Allergen Reactions  . Aspirin Nausea And Vomiting  . Codeine Nausea And Vomiting  . Morphine And Related Nausea And Vomiting    Current Outpatient Prescriptions  Medication Sig Dispense Refill  . acetaminophen (TYLENOL) 500 MG tablet Take 1,000 mg by mouth every 6 (six) hours as needed.    Marland Kitchen albuterol (PROVENTIL HFA;VENTOLIN HFA) 108 (90 Base) MCG/ACT inhaler Inhale 2 puffs into the lungs every 6 (six) hours as needed for wheezing or shortness of breath. 3 Inhaler 1  . ALPRAZolam (XANAX) 0.25 MG tablet Take 1 tablet (0.25 mg total) by mouth 2 (two) times daily as needed for anxiety. 30 tablet 0  . amLODipine (NORVASC) 5 MG tablet Take 1 tablet (5 mg total) by mouth daily. 90 tablet 1  . atorvastatin (LIPITOR) 20 MG tablet Take 1 tablet (20 mg total) by mouth at bedtime. 90 tablet 1  . lidocaine-prilocaine (EMLA) cream Apply 1 application topically as needed. 30 g 3  . meclizine (ANTIVERT) 25 MG tablet Take 25 mg by mouth 3 (three) times daily as needed for dizziness.    . prochlorperazine (COMPAZINE) 10 MG tablet Take 10 mg by mouth every 6 (six) hours as needed for nausea or vomiting.    . traMADol-acetaminophen (ULTRACET) 37.5-325 MG tablet Take 1-2 tablets by mouth every 4 (four) hours as needed for moderate pain. 30 tablet 0  . Umeclidinium-Vilanterol (ANORO ELLIPTA) 62.5-25 MCG/INH AEPB Inhale 1 puff into the lungs daily. 180 each 1   No current facility-administered medications for this visit.   Facility-Administered Medications Ordered in Other Visits  Medication Dose Route Frequency Provider Last Rate Last Dose  . fluorouracil (ADRUCIL) 7,350 mg in sodium chloride 0.9 % 103 mL chemo infusion  1,000 mg/m2/day (Treatment Plan Actual) Intravenous 4 days Lloyd Huger, MD   7,350 mg at 12/15/15  1205  . sodium chloride flush (NS) 0.9 % injection 10 mL  10 mL Intravenous PRN Lloyd Huger, MD   10 mL at 12/15/15 1011    OBJECTIVE: BP 139/84 mmHg  Pulse 86  Temp(Src) 96.7 F (35.9 C) (Tympanic)  Resp 20  Wt 168 lb 14 oz (76.6 kg)   Body mass index is 30.88 kg/(m^2).    ECOG FS:0 - Asymptomatic  General: Well-developed, well-nourished, no acute distress. Slightly anxious. Eyes: Pink conjunctiva, anicteric sclera. HEENT: Normocephalic, moist mucous membranes, clear oropharnyx. Lungs: Clear to auscultation bilaterally. Heart: Regular rate and rhythm. No rubs, murmurs, or gallops. Abdomen: Soft, nontender, nondistended. No organomegaly noted, normoactive bowel sounds. Musculoskeletal: No edema, cyanosis, or clubbing. Neuro: Alert, answering all questions appropriately. Cranial nerves grossly intact. Skin: No rashes or petechiae noted.  Psych: Normal affect. Lymphatics: No cervical, clavicular LAD.   LAB RESULTS:  Appointment on 12/15/2015  Component Date Value Ref Range Status  . WBC 12/15/2015 6.8  3.6 - 11.0 K/uL Final  . RBC 12/15/2015 5.13  3.80 - 5.20 MIL/uL Final  . Hemoglobin 12/15/2015 15.1  12.0 - 16.0 g/dL Final  . HCT 12/15/2015 44.2  35.0 - 47.0 % Final  . MCV 12/15/2015 86.3  80.0 - 100.0 fL Final  . MCH 12/15/2015 29.5  26.0 - 34.0 pg Final  . MCHC 12/15/2015 34.2  32.0 - 36.0 g/dL Final  . RDW 12/15/2015 13.9  11.5 - 14.5 % Final  . Platelets 12/15/2015 216  150 - 440 K/uL Final  . Neutrophils Relative % 12/15/2015 56   Final  . Neutro Abs 12/15/2015 3.8  1.4 - 6.5 K/uL Final  . Lymphocytes Relative 12/15/2015 29   Final  . Lymphs Abs 12/15/2015 2.0  1.0 - 3.6 K/uL Final  . Monocytes Relative 12/15/2015 9   Final  . Monocytes Absolute 12/15/2015 0.6  0.2 - 0.9 K/uL Final  . Eosinophils Relative 12/15/2015 4   Final  . Eosinophils Absolute 12/15/2015 0.3  0 - 0.7 K/uL Final  . Basophils Relative 12/15/2015 2   Final  . Basophils Absolute 12/15/2015  0.1  0 - 0.1 K/uL Final  . Sodium 12/15/2015 135  135 - 145 mmol/L Final  . Potassium 12/15/2015 3.7  3.5 - 5.1 mmol/L Final  . Chloride 12/15/2015 101  101 - 111 mmol/L Final  . CO2 12/15/2015 29  22 - 32 mmol/L Final  . Glucose, Bld 12/15/2015 103* 65 - 99 mg/dL Final  . BUN 12/15/2015 15  6 - 20 mg/dL Final  . Creatinine, Ser 12/15/2015 0.72  0.44 - 1.00 mg/dL Final  . Calcium 12/15/2015 9.7  8.9 - 10.3 mg/dL Final  . Total Protein 12/15/2015 8.2* 6.5 - 8.1 g/dL Final  . Albumin 12/15/2015 4.0  3.5 - 5.0 g/dL Final  . AST 12/15/2015 21  15 - 41 U/L Final  . ALT 12/15/2015 14  14 - 54 U/L Final  . Alkaline Phosphatase 12/15/2015 84  38 - 126 U/L Final  . Total Bilirubin 12/15/2015 0.4  0.3 - 1.2 mg/dL Final  . GFR calc non Af Amer 12/15/2015 >60  >60 mL/min Final  . GFR calc Af Amer 12/15/2015 >60  >60 mL/min Final   Comment: (NOTE) The eGFR has been calculated using the CKD EPI equation. This calculation has not been validated in all clinical situations. eGFR's persistently <60 mL/min signify possible Chronic Kidney Disease.   . Anion gap 12/15/2015 5  5 - 15 Final    STUDIES: No results found.  ASSESSMENT:  Anal carcinoma, stage II, T2 N0 M0.  PLAN:   1. Anal carcinoma: Patient to begin chemotherapy with 5-FU and mitomycin today as well as daily XRT. According to CT and PET scans there is no suggestion of local regional or distant disease. Of note, Initial plan was to do concurrent chemotherapy with radiation using capecitabine and mitomycin. Unfortunately, patient could not afford her co-pay for capecitabine therefore she will proceed with mitomycin and 5-FU pump on days 1- 4 as well as days 25-29. Port-A-Cath site with slight edema but overall healing very well. Patient to return daily for XRT and again on Friday for pump removal. Her next chemotherapy will be planned for April 20.  Patient expressed understanding and was in agreement with this plan. She also understands  that  She can call clinic at any time with any questions, concerns, or complaints.   Dr. Oliva Bustard was available for consultation and review of plan of care for this patient.  Squamous cell carcinoma of anus (HCC)   Staging form: Anus, AJCC 7th Edition     Clinical stage from 11/28/2015: Stage II (T2, N0, M0) - Signed by Lloyd Huger, MD on 11/28/2015   Evlyn Kanner, NP   12/15/2015 3:11 PM

## 2015-12-16 ENCOUNTER — Ambulatory Visit
Admission: RE | Admit: 2015-12-16 | Discharge: 2015-12-16 | Disposition: A | Discharge status: Home / Self Care | Payer: Medicare PPO | Source: Ambulatory Visit | Attending: Radiation Oncology | Admitting: Radiation Oncology

## 2015-12-16 DIAGNOSIS — Z510 Encounter for antineoplastic radiation therapy: Secondary | ICD-10-CM | POA: Diagnosis not present

## 2015-12-16 LAB — CEA: CEA: 5.6 ng/mL — AB (ref 0.0–4.7)

## 2015-12-17 ENCOUNTER — Ambulatory Visit
Admission: RE | Admit: 2015-12-17 | Discharge: 2015-12-17 | Disposition: A | Discharge status: Home / Self Care | Payer: Medicare PPO | Source: Ambulatory Visit | Attending: Radiation Oncology | Admitting: Radiation Oncology

## 2015-12-17 DIAGNOSIS — Z510 Encounter for antineoplastic radiation therapy: Secondary | ICD-10-CM | POA: Diagnosis not present

## 2015-12-18 ENCOUNTER — Ambulatory Visit
Admission: RE | Admit: 2015-12-18 | Discharge: 2015-12-18 | Disposition: A | Discharge status: Home / Self Care | Payer: Medicare PPO | Source: Ambulatory Visit | Attending: Radiation Oncology | Admitting: Radiation Oncology

## 2015-12-18 DIAGNOSIS — Z510 Encounter for antineoplastic radiation therapy: Secondary | ICD-10-CM | POA: Diagnosis not present

## 2015-12-19 ENCOUNTER — Inpatient Hospital Stay: Payer: Medicare PPO

## 2015-12-19 ENCOUNTER — Ambulatory Visit
Admission: RE | Admit: 2015-12-19 | Discharge: 2015-12-19 | Disposition: A | Discharge status: Home / Self Care | Payer: Medicare PPO | Source: Ambulatory Visit | Attending: Radiation Oncology | Admitting: Radiation Oncology

## 2015-12-19 VITALS — BP 147/84 | HR 96 | Temp 97.6°F | Resp 18

## 2015-12-19 DIAGNOSIS — Z510 Encounter for antineoplastic radiation therapy: Secondary | ICD-10-CM | POA: Diagnosis not present

## 2015-12-19 DIAGNOSIS — Z5111 Encounter for antineoplastic chemotherapy: Secondary | ICD-10-CM | POA: Diagnosis not present

## 2015-12-19 DIAGNOSIS — C210 Malignant neoplasm of anus, unspecified: Secondary | ICD-10-CM

## 2015-12-19 MED ORDER — SODIUM CHLORIDE 0.9% FLUSH
10.0000 mL | INTRAVENOUS | Status: DC | PRN
  Administered 2015-12-19: 10 mL
  Filled 2015-12-19: qty 10

## 2015-12-19 MED ORDER — HEPARIN SOD (PORK) LOCK FLUSH 100 UNIT/ML IV SOLN
500.0000 [IU] | Freq: Once | INTRAVENOUS | Status: AC | PRN
  Administered 2015-12-19: 500 [IU]
  Filled 2015-12-19: qty 5

## 2015-12-22 ENCOUNTER — Encounter: Payer: Self-pay | Admitting: Surgery

## 2015-12-22 ENCOUNTER — Ambulatory Visit
Admission: RE | Admit: 2015-12-22 | Discharge: 2015-12-22 | Disposition: A | Discharge status: Home / Self Care | Payer: Medicare PPO | Source: Ambulatory Visit | Attending: Radiation Oncology | Admitting: Radiation Oncology

## 2015-12-22 ENCOUNTER — Inpatient Hospital Stay: Payer: Medicare PPO | Attending: Radiation Oncology

## 2015-12-22 ENCOUNTER — Encounter: Payer: Self-pay | Admitting: Gastroenterology

## 2015-12-22 DIAGNOSIS — Z79899 Other long term (current) drug therapy: Secondary | ICD-10-CM | POA: Insufficient documentation

## 2015-12-22 DIAGNOSIS — M5136 Other intervertebral disc degeneration, lumbar region: Secondary | ICD-10-CM | POA: Insufficient documentation

## 2015-12-22 DIAGNOSIS — Q600 Renal agenesis, unilateral: Secondary | ICD-10-CM | POA: Insufficient documentation

## 2015-12-22 DIAGNOSIS — R110 Nausea: Secondary | ICD-10-CM | POA: Diagnosis not present

## 2015-12-22 DIAGNOSIS — I252 Old myocardial infarction: Secondary | ICD-10-CM | POA: Insufficient documentation

## 2015-12-22 DIAGNOSIS — R5383 Other fatigue: Secondary | ICD-10-CM | POA: Insufficient documentation

## 2015-12-22 DIAGNOSIS — R531 Weakness: Secondary | ICD-10-CM | POA: Diagnosis not present

## 2015-12-22 DIAGNOSIS — Z510 Encounter for antineoplastic radiation therapy: Secondary | ICD-10-CM | POA: Diagnosis not present

## 2015-12-22 DIAGNOSIS — J449 Chronic obstructive pulmonary disease, unspecified: Secondary | ICD-10-CM | POA: Diagnosis not present

## 2015-12-22 DIAGNOSIS — F419 Anxiety disorder, unspecified: Secondary | ICD-10-CM | POA: Diagnosis not present

## 2015-12-22 DIAGNOSIS — J439 Emphysema, unspecified: Secondary | ICD-10-CM | POA: Diagnosis not present

## 2015-12-22 DIAGNOSIS — M79603 Pain in arm, unspecified: Secondary | ICD-10-CM | POA: Diagnosis not present

## 2015-12-22 DIAGNOSIS — Z807 Family history of other malignant neoplasms of lymphoid, hematopoietic and related tissues: Secondary | ICD-10-CM | POA: Insufficient documentation

## 2015-12-22 DIAGNOSIS — C210 Malignant neoplasm of anus, unspecified: Secondary | ICD-10-CM | POA: Diagnosis not present

## 2015-12-22 DIAGNOSIS — F17210 Nicotine dependence, cigarettes, uncomplicated: Secondary | ICD-10-CM | POA: Insufficient documentation

## 2015-12-22 DIAGNOSIS — I2510 Atherosclerotic heart disease of native coronary artery without angina pectoris: Secondary | ICD-10-CM | POA: Diagnosis not present

## 2015-12-22 DIAGNOSIS — I10 Essential (primary) hypertension: Secondary | ICD-10-CM | POA: Diagnosis not present

## 2015-12-22 DIAGNOSIS — E785 Hyperlipidemia, unspecified: Secondary | ICD-10-CM | POA: Insufficient documentation

## 2015-12-22 DIAGNOSIS — Z803 Family history of malignant neoplasm of breast: Secondary | ICD-10-CM | POA: Diagnosis not present

## 2015-12-22 LAB — CBC
HEMATOCRIT: 40.4 % (ref 35.0–47.0)
Hemoglobin: 13.8 g/dL (ref 12.0–16.0)
MCH: 29.4 pg (ref 26.0–34.0)
MCHC: 34.2 g/dL (ref 32.0–36.0)
MCV: 85.9 fL (ref 80.0–100.0)
PLATELETS: 130 10*3/uL — AB (ref 150–440)
RBC: 4.71 MIL/uL (ref 3.80–5.20)
RDW: 13.4 % (ref 11.5–14.5)
WBC: 3.9 10*3/uL (ref 3.6–11.0)

## 2015-12-23 ENCOUNTER — Telehealth: Payer: Self-pay

## 2015-12-23 ENCOUNTER — Ambulatory Visit
Admission: RE | Admit: 2015-12-23 | Discharge: 2015-12-23 | Disposition: A | Discharge status: Home / Self Care | Payer: Medicare PPO | Source: Ambulatory Visit | Attending: Radiation Oncology | Admitting: Radiation Oncology

## 2015-12-23 ENCOUNTER — Other Ambulatory Visit: Payer: Self-pay | Admitting: *Deleted

## 2015-12-23 DIAGNOSIS — C210 Malignant neoplasm of anus, unspecified: Secondary | ICD-10-CM

## 2015-12-23 DIAGNOSIS — Z510 Encounter for antineoplastic radiation therapy: Secondary | ICD-10-CM | POA: Diagnosis not present

## 2015-12-23 MED ORDER — ALPRAZOLAM 0.25 MG PO TABS: 0.2500 mg | ORAL_TABLET | Freq: Two times a day (BID) | ORAL | Status: DC | PRN

## 2015-12-23 MED ORDER — SUCRALFATE 1 G PO TABS: 1.0000 g | ORAL_TABLET | Freq: Three times a day (TID) | ORAL | Status: DC

## 2015-12-23 NOTE — Telephone Encounter (Signed)
Called to report that she has mouth and throat "sores red with white center" and that her lips are swollen also. Dr Baruch Gouty gave her Carafate, but it is not helping (this was just ordered today). She also reports that she has the same type lesions on her buttocks. Asking for med for them. Requests refill on her Xanax

## 2015-12-23 NOTE — Telephone Encounter (Signed)
  Oncology Nurse Navigator Documentation  Navigator Location: [facility] (12/23/15 1600) Navigator Encounter Type: Telephone (12/23/15 1600) Telephone: Symptom Mgt (12/23/15 1600)             Barriers/Navigation Needs: Financial;Education;Coordination of Care (12/23/15 1600)                          Time Spent with Patient: 30 (12/23/15 1600)   Received call from patient. Asking about assistance programs with mowing lawn, etc. Also reports mouth sores and pain with swallowing. Dr Baruch Gouty called in carafate today 4/4. Instructed to take as directed and if not effective to make [place] aware. Referral made to patient services navigator for financial assistance regarding local programs.

## 2015-12-23 NOTE — Telephone Encounter (Signed)
Per Dr Grayland Ormond, pt to get OTC hydrocortisone cream to use on lesions on her buttocks area and she is to try the Carafate ordered today for her mouth and let us know if it is improving at the end of the week, if not will need to be seen early next week.

## 2015-12-23 NOTE — Telephone Encounter (Signed)
Patient Instructed in Dr Virgel Manifold orders and repeated back to me. She confirmed to send Rx to [facility] on [address]

## 2015-12-24 ENCOUNTER — Telehealth: Payer: Self-pay | Admitting: *Deleted

## 2015-12-24 ENCOUNTER — Ambulatory Visit: Payer: Medicare PPO

## 2015-12-24 ENCOUNTER — Inpatient Hospital Stay: Payer: Medicare PPO

## 2015-12-24 ENCOUNTER — Other Ambulatory Visit: Payer: Self-pay | Admitting: Oncology

## 2015-12-24 VITALS — BP 105/55 | HR 77 | Temp 99.0°F

## 2015-12-24 DIAGNOSIS — C210 Malignant neoplasm of anus, unspecified: Secondary | ICD-10-CM

## 2015-12-24 MED ORDER — SODIUM CHLORIDE 0.9 % IV SOLN
Freq: Once | INTRAVENOUS | Status: DC
  Filled 2015-12-24: qty 4

## 2015-12-24 MED ORDER — SODIUM CHLORIDE 0.9% FLUSH
10.0000 mL | INTRAVENOUS | Status: DC | PRN
  Administered 2015-12-24: 10 mL via INTRAVENOUS
  Filled 2015-12-24: qty 10

## 2015-12-24 MED ORDER — HEPARIN SOD (PORK) LOCK FLUSH 100 UNIT/ML IV SOLN
INTRAVENOUS | Status: AC
  Filled 2015-12-24: qty 5

## 2015-12-24 MED ORDER — DEXAMETHASONE SODIUM PHOSPHATE 100 MG/10ML IJ SOLN
Freq: Once | INTRAMUSCULAR | Status: AC
  Administered 2015-12-24: 14:00:00 via INTRAVENOUS
  Filled 2015-12-24: qty 4

## 2015-12-24 MED ORDER — SODIUM CHLORIDE 0.9 % IV SOLN
Freq: Once | INTRAVENOUS | Status: AC
  Administered 2015-12-24: 14:00:00 via INTRAVENOUS
  Filled 2015-12-24: qty 1000

## 2015-12-24 MED ORDER — HEPARIN SOD (PORK) LOCK FLUSH 100 UNIT/ML IV SOLN
500.0000 [IU] | Freq: Once | INTRAVENOUS | Status: AC
  Administered 2015-12-24: 500 [IU] via INTRAVENOUS

## 2015-12-24 MED ORDER — SODIUM CHLORIDE 0.9 % IV SOLN
Freq: Once | INTRAVENOUS | Status: DC
  Filled 2015-12-24: qty 1000

## 2015-12-24 NOTE — Telephone Encounter (Signed)
Called to states she does not think she can come for XRT today she has vomited 3 times this morning. Per Dr Grayland Ormond, have her come in to get IVF if chair available. Patient agrees to go to [facility] office for IVF at 1:45

## 2015-12-25 ENCOUNTER — Ambulatory Visit
Admission: RE | Admit: 2015-12-25 | Discharge: 2015-12-25 | Disposition: A | Discharge status: Home / Self Care | Payer: Medicare PPO | Source: Ambulatory Visit | Attending: Radiation Oncology | Admitting: Radiation Oncology

## 2015-12-25 DIAGNOSIS — Z510 Encounter for antineoplastic radiation therapy: Secondary | ICD-10-CM | POA: Diagnosis not present

## 2015-12-26 ENCOUNTER — Ambulatory Visit
Admission: RE | Admit: 2015-12-26 | Discharge: 2015-12-26 | Disposition: A | Discharge status: Home / Self Care | Payer: Medicare PPO | Source: Ambulatory Visit | Attending: Radiation Oncology | Admitting: Radiation Oncology

## 2015-12-26 DIAGNOSIS — Z510 Encounter for antineoplastic radiation therapy: Secondary | ICD-10-CM | POA: Diagnosis not present

## 2015-12-29 ENCOUNTER — Ambulatory Visit
Admission: RE | Admit: 2015-12-29 | Discharge: 2015-12-29 | Disposition: A | Discharge status: Home / Self Care | Payer: Medicare PPO | Source: Ambulatory Visit | Attending: Radiation Oncology | Admitting: Radiation Oncology

## 2015-12-29 ENCOUNTER — Other Ambulatory Visit: Payer: Self-pay | Admitting: *Deleted

## 2015-12-29 ENCOUNTER — Ambulatory Visit: Payer: Medicare PPO | Admitting: Surgery

## 2015-12-29 ENCOUNTER — Inpatient Hospital Stay: Payer: Medicare PPO

## 2015-12-29 DIAGNOSIS — C210 Malignant neoplasm of anus, unspecified: Secondary | ICD-10-CM | POA: Diagnosis not present

## 2015-12-29 DIAGNOSIS — Z510 Encounter for antineoplastic radiation therapy: Secondary | ICD-10-CM | POA: Diagnosis not present

## 2015-12-29 LAB — CBC
HCT: 35.7 % (ref 35.0–47.0)
HEMOGLOBIN: 12.2 g/dL (ref 12.0–16.0)
MCH: 29 pg (ref 26.0–34.0)
MCHC: 34.2 g/dL (ref 32.0–36.0)
MCV: 84.9 fL (ref 80.0–100.0)
PLATELETS: 37 10*3/uL — AB (ref 150–440)
RBC: 4.21 MIL/uL (ref 3.80–5.20)
RDW: 13.5 % (ref 11.5–14.5)
WBC: 1.5 10*3/uL — ABNORMAL LOW (ref 3.6–11.0)

## 2015-12-30 ENCOUNTER — Other Ambulatory Visit: Payer: Self-pay

## 2015-12-30 ENCOUNTER — Ambulatory Visit: Payer: Medicare PPO

## 2015-12-31 ENCOUNTER — Encounter: Payer: Self-pay | Admitting: Surgery

## 2015-12-31 ENCOUNTER — Ambulatory Visit: Payer: Medicare PPO

## 2016-01-01 ENCOUNTER — Ambulatory Visit: Payer: Medicare PPO

## 2016-01-02 ENCOUNTER — Ambulatory Visit: Payer: Medicare PPO

## 2016-01-02 ENCOUNTER — Telehealth: Payer: Self-pay

## 2016-01-02 NOTE — Telephone Encounter (Signed)
  Oncology Nurse Navigator Documentation  Navigator Location: [facility] (01/02/16 1100) Navigator Encounter Type: Telephone (01/02/16 1100) Telephone: Symptom Mgt (01/02/16 1100)                                        Time Spent with Patient: 15 (01/02/16 1100)   Received call from Carol Gay asking why her feet are swelling. In treatment for anal cancer. Chemotherapy is completed and she is on break from XRT due to low platelets. Denies leg swelling and described feet as more puffy. Does not stay on them for periods of time. BP stable, no hx of CHF. Has mitral valve prolapse. Only other symptom is chills at times without fever. Scheduled for labs and to resume XRT Monday. Notified her that I would route message to on call physician and if anything further needs to be done that we would advise her. Dr Mike Gip if she needs anything further please advise.

## 2016-01-02 NOTE — Telephone Encounter (Signed)
  Can you call the patient?  M

## 2016-01-02 NOTE — Telephone Encounter (Signed)
After discussing with Dr Mike Gip, I called pt and advised to watch her sodium intake, keep her feet elevated and to increase her water intake. She repeated this back to me

## 2016-01-05 ENCOUNTER — Inpatient Hospital Stay: Payer: Medicare PPO

## 2016-01-05 ENCOUNTER — Ambulatory Visit
Admission: RE | Admit: 2016-01-05 | Discharge: 2016-01-05 | Disposition: A | Discharge status: Home / Self Care | Payer: Medicare PPO | Source: Ambulatory Visit | Attending: Radiation Oncology | Admitting: Radiation Oncology

## 2016-01-05 DIAGNOSIS — C210 Malignant neoplasm of anus, unspecified: Secondary | ICD-10-CM

## 2016-01-05 DIAGNOSIS — Z510 Encounter for antineoplastic radiation therapy: Secondary | ICD-10-CM | POA: Diagnosis not present

## 2016-01-05 LAB — CBC
HCT: 36.5 % (ref 35.0–47.0)
HEMOGLOBIN: 12.6 g/dL (ref 12.0–16.0)
MCH: 29.4 pg (ref 26.0–34.0)
MCHC: 34.5 g/dL (ref 32.0–36.0)
MCV: 85.4 fL (ref 80.0–100.0)
Platelets: 243 10*3/uL (ref 150–440)
RBC: 4.27 MIL/uL (ref 3.80–5.20)
RDW: 14.1 % (ref 11.5–14.5)
WBC: 4.4 10*3/uL (ref 3.6–11.0)

## 2016-01-05 NOTE — Progress Notes (Unsigned)
PSN met with patient today.  Patient reported that her neighbor is now mowing her lawn at no cost.  Patient will receive assistance with her utilities, gas and food due to additional medical expenses.

## 2016-01-06 ENCOUNTER — Ambulatory Visit
Admission: RE | Admit: 2016-01-06 | Discharge: 2016-01-06 | Disposition: A | Discharge status: Home / Self Care | Payer: Medicare PPO | Source: Ambulatory Visit | Attending: Radiation Oncology | Admitting: Radiation Oncology

## 2016-01-06 DIAGNOSIS — Z510 Encounter for antineoplastic radiation therapy: Secondary | ICD-10-CM | POA: Diagnosis not present

## 2016-01-07 ENCOUNTER — Ambulatory Visit
Admission: RE | Admit: 2016-01-07 | Discharge: 2016-01-07 | Disposition: A | Discharge status: Home / Self Care | Payer: Medicare PPO | Source: Ambulatory Visit | Attending: Radiation Oncology | Admitting: Radiation Oncology

## 2016-01-07 DIAGNOSIS — Z510 Encounter for antineoplastic radiation therapy: Secondary | ICD-10-CM | POA: Diagnosis not present

## 2016-01-08 ENCOUNTER — Ambulatory Visit
Admission: RE | Admit: 2016-01-08 | Discharge: 2016-01-08 | Disposition: A | Discharge status: Home / Self Care | Payer: Medicare PPO | Source: Ambulatory Visit | Attending: Radiation Oncology | Admitting: Radiation Oncology

## 2016-01-08 ENCOUNTER — Inpatient Hospital Stay: Payer: Medicare PPO

## 2016-01-08 ENCOUNTER — Inpatient Hospital Stay (HOSPITAL_BASED_OUTPATIENT_CLINIC_OR_DEPARTMENT_OTHER): Payer: Medicare PPO | Admitting: Oncology

## 2016-01-08 ENCOUNTER — Encounter: Payer: Self-pay | Admitting: Oncology

## 2016-01-08 VITALS — BP 127/80 | HR 82 | Temp 97.6°F | Resp 18 | Wt 166.7 lb

## 2016-01-08 DIAGNOSIS — F17210 Nicotine dependence, cigarettes, uncomplicated: Secondary | ICD-10-CM

## 2016-01-08 DIAGNOSIS — Z79899 Other long term (current) drug therapy: Secondary | ICD-10-CM

## 2016-01-08 DIAGNOSIS — I2510 Atherosclerotic heart disease of native coronary artery without angina pectoris: Secondary | ICD-10-CM

## 2016-01-08 DIAGNOSIS — R531 Weakness: Secondary | ICD-10-CM | POA: Diagnosis not present

## 2016-01-08 DIAGNOSIS — R5383 Other fatigue: Secondary | ICD-10-CM

## 2016-01-08 DIAGNOSIS — J439 Emphysema, unspecified: Secondary | ICD-10-CM

## 2016-01-08 DIAGNOSIS — I252 Old myocardial infarction: Secondary | ICD-10-CM

## 2016-01-08 DIAGNOSIS — E785 Hyperlipidemia, unspecified: Secondary | ICD-10-CM

## 2016-01-08 DIAGNOSIS — Q600 Renal agenesis, unilateral: Secondary | ICD-10-CM

## 2016-01-08 DIAGNOSIS — R110 Nausea: Secondary | ICD-10-CM

## 2016-01-08 DIAGNOSIS — C210 Malignant neoplasm of anus, unspecified: Secondary | ICD-10-CM | POA: Diagnosis not present

## 2016-01-08 DIAGNOSIS — Z803 Family history of malignant neoplasm of breast: Secondary | ICD-10-CM

## 2016-01-08 DIAGNOSIS — M5136 Other intervertebral disc degeneration, lumbar region: Secondary | ICD-10-CM

## 2016-01-08 DIAGNOSIS — I10 Essential (primary) hypertension: Secondary | ICD-10-CM

## 2016-01-08 DIAGNOSIS — M79603 Pain in arm, unspecified: Secondary | ICD-10-CM

## 2016-01-08 DIAGNOSIS — Z510 Encounter for antineoplastic radiation therapy: Secondary | ICD-10-CM | POA: Diagnosis not present

## 2016-01-08 DIAGNOSIS — F419 Anxiety disorder, unspecified: Secondary | ICD-10-CM

## 2016-01-08 DIAGNOSIS — Z807 Family history of other malignant neoplasms of lymphoid, hematopoietic and related tissues: Secondary | ICD-10-CM

## 2016-01-08 DIAGNOSIS — J449 Chronic obstructive pulmonary disease, unspecified: Secondary | ICD-10-CM

## 2016-01-08 LAB — COMPREHENSIVE METABOLIC PANEL
ALBUMIN: 3.4 g/dL — AB (ref 3.5–5.0)
ALT: 12 U/L — ABNORMAL LOW (ref 14–54)
ANION GAP: 6 (ref 5–15)
AST: 17 U/L (ref 15–41)
Alkaline Phosphatase: 67 U/L (ref 38–126)
BUN: 16 mg/dL (ref 6–20)
CHLORIDE: 104 mmol/L (ref 101–111)
CO2: 26 mmol/L (ref 22–32)
Calcium: 9.5 mg/dL (ref 8.9–10.3)
Creatinine, Ser: 0.66 mg/dL (ref 0.44–1.00)
GFR calc non Af Amer: >60 mL/min (ref >60)
GLUCOSE: 103 mg/dL — AB (ref 65–99)
POTASSIUM: 4.1 mmol/L (ref 3.5–5.1)
SODIUM: 136 mmol/L (ref 135–145)
Total Bilirubin: 0.2 mg/dL — ABNORMAL LOW (ref 0.3–1.2)
Total Protein: 7.4 g/dL (ref 6.5–8.1)

## 2016-01-08 LAB — CBC WITH DIFFERENTIAL/PLATELET
BASOS PCT: 3 %
Basophils Absolute: 0.1 10*3/uL (ref 0–0.1)
EOS ABS: 0 10*3/uL (ref 0–0.7)
EOS PCT: 1 %
HCT: 35.6 % (ref 35.0–47.0)
HEMOGLOBIN: 12.3 g/dL (ref 12.0–16.0)
LYMPHS ABS: 0.5 10*3/uL — AB (ref 1.0–3.6)
Lymphocytes Relative: 12 %
MCH: 29.6 pg (ref 26.0–34.0)
MCHC: 34.6 g/dL (ref 32.0–36.0)
MCV: 85.6 fL (ref 80.0–100.0)
MONOS PCT: 16 %
Monocytes Absolute: 0.6 10*3/uL (ref 0.2–0.9)
NEUTROS PCT: 68 %
Neutro Abs: 2.6 10*3/uL (ref 1.4–6.5)
PLATELETS: 325 10*3/uL (ref 150–440)
RBC: 4.16 MIL/uL (ref 3.80–5.20)
RDW: 14.3 % (ref 11.5–14.5)
WBC: 3.8 10*3/uL (ref 3.6–11.0)

## 2016-01-08 MED ORDER — SODIUM CHLORIDE 0.9% FLUSH
10.0000 mL | Freq: Once | INTRAVENOUS | Status: AC
  Administered 2016-01-08: 10 mL via INTRAVENOUS
  Filled 2016-01-08: qty 10

## 2016-01-08 MED ORDER — PROCHLORPERAZINE MALEATE 10 MG PO TABS: 10.0000 mg | ORAL_TABLET | Freq: Four times a day (QID) | ORAL | Status: DC | PRN

## 2016-01-08 MED ORDER — HEPARIN SOD (PORK) LOCK FLUSH 100 UNIT/ML IV SOLN
500.0000 [IU] | Freq: Once | INTRAVENOUS | Status: AC
  Administered 2016-01-08: 500 [IU] via INTRAVENOUS
  Filled 2016-01-08: qty 5

## 2016-01-08 NOTE — Progress Notes (Signed)
Patient is feeling weak and fatigued all the time.  Last week she was having ankle edema that has now improved.  Has nausea that will worsen after receiving treatment.

## 2016-01-09 ENCOUNTER — Ambulatory Visit
Admission: RE | Admit: 2016-01-09 | Discharge: 2016-01-09 | Disposition: A | Discharge status: Home / Self Care | Payer: Medicare PPO | Source: Ambulatory Visit | Attending: Radiation Oncology | Admitting: Radiation Oncology

## 2016-01-09 DIAGNOSIS — Z510 Encounter for antineoplastic radiation therapy: Secondary | ICD-10-CM | POA: Diagnosis not present

## 2016-01-11 NOTE — Progress Notes (Signed)
[place]  [phone]  [phone]     Carol Gay DOB: 04/11/1946  MR#: [id number]  UUV#:[id number]  Patient Care Team: Glean Hess, MD as PCP - General (Family Medicine) Clent Jacks, RN as Registered Nurse  CHIEF COMPLAINT:  Chief Complaint  Patient presents with  . anal cancer    INTERVAL HISTORY:   Patient returns to clinic today for further evaluation and laboratory work. She tolerated her first infusion of mitomycin and 5-FU chemotherapy, days 1 through 4 with only some increased nausea which has since resolved. She has persistent weakness and fatigue. She does not complain of pain or bleeding at this time. She has no neurologic complaints. She has good appetite and denies weight loss. She denies any recent fevers. She has no chest pain or shortness of breath. She denies any nausea, vomiting, constipation, or diarrhea. She has no urinary complaints. Patient is mildly anxious today. Patient offers no specific complaints today.  REVIEW OF SYSTEMS:   Review of Systems  Constitutional: Positive for malaise/fatigue. Negative for fever, weight loss and diaphoresis.  HENT: Negative.   Eyes: Negative.   Respiratory: Negative.  Negative for cough and shortness of breath.   Cardiovascular: Positive for leg swelling. Negative for chest pain.  Gastrointestinal: Positive for nausea. Negative for heartburn, vomiting, abdominal pain, diarrhea, constipation, blood in stool and melena.  Genitourinary: Negative.   Musculoskeletal: Negative.   Skin: Negative.   Neurological: Positive for weakness.  Endo/Heme/Allergies: Does not bruise/bleed easily.  Psychiatric/Behavioral: The patient is nervous/anxious.     As per HPI. Otherwise, a complete review of systems is negatve.   PAST MEDICAL HISTORY: Past Medical History  Diagnosis Date  . Cigarette smoker   . Hypertension   . Arm pain   . Rectal bleeding   . COPD (chronic obstructive  pulmonary disease) ([place])   . Hemorrhoids   . CAD in native artery   . Pulmonary emphysema ([place])   . Dyslipidemia   . Dermatophytoses   . DDD (degenerative disc disease), lumbar        . Aortic atherosclerosis ([place])   . H/O blood clots     left leg, veins stripped  . Shortness of breath dyspnea   . Smokers' cough ([place])   . Myocardial infarction [facility])     "mild" - age 39  . Congenital absence of one kidney     born with only right kidney  . Vertigo   . Wears dentures     has full upper and lower - doesn't wear  . Asthma   . Claustrophobia   . Last menstrual period (LMP) > 10 days ago 1994    PAST SURGICAL HISTORY: Past Surgical History  Procedure Laterality Date  . Vein ligation and stripping    . Ovarian cyst removal Left   . Appendectomy    . Lumbar disc surgery  1997  . Breast biopsy    . Exploratory laparotomy    . Lesion excision  11/03/15    Anal - Dr Dahlia Byes, [facility]  . Colonoscopy with propofol N/A 11/06/2015    Procedure: COLONOSCOPY WITH PROPOFOL;  Surgeon: Lucilla Lame, MD;  Location: [place];  Service: Endoscopy;  Laterality: N/A;  LEAVE PT EARLY  . Polypectomy  11/06/2015    Procedure: POLYPECTOMY;  Surgeon: Lucilla Lame, MD;  Location: [place];  Service: Endoscopy;;  . Portacath placement N/A 12/10/2015    Procedure: INSERTION PORT-A-CATH;  Surgeon: Jules Husbands,  MD;  Location: [facility];  Service: General;  Laterality: N/A;    FAMILY HISTORY Family History  Problem Relation Age of Onset  . Cancer Mother     lymphoma  . Heart disease Mother   . Cancer Maternal Grandmother     breast    GYNECOLOGIC HISTORY:  No LMP recorded. Patient is postmenopausal.     ADVANCED DIRECTIVES:    HEALTH MAINTENANCE: Social History  Substance Use Topics  . Smoking status: Current Every Day Smoker -- 1.00 packs/day for 57 years    Types: Cigarettes  . Smokeless tobacco: Not on file     Comment: not quite ready  . Alcohol Use: No      Allergies  Allergen Reactions  . Aspirin Nausea And Vomiting  . Codeine Nausea And Vomiting  . Morphine And Related Nausea And Vomiting    Current Outpatient Prescriptions  Medication Sig Dispense Refill  . acetaminophen (TYLENOL) 500 MG tablet Take 1,000 mg by mouth every 6 (six) hours as needed.    Marland Kitchen albuterol (PROVENTIL HFA;VENTOLIN HFA) 108 (90 Base) MCG/ACT inhaler Inhale 2 puffs into the lungs every 6 (six) hours as needed for wheezing or shortness of breath. 3 Inhaler 1  . ALPRAZolam (XANAX) 0.25 MG tablet Take 1 tablet (0.25 mg total) by mouth 2 (two) times daily as needed for anxiety. 60 tablet 0  . amLODipine (NORVASC) 5 MG tablet Take 1 tablet (5 mg total) by mouth daily. 90 tablet 1  . atorvastatin (LIPITOR) 20 MG tablet Take 1 tablet (20 mg total) by mouth at bedtime. 90 tablet 1  . lidocaine-prilocaine (EMLA) cream Apply 1 application topically as needed. 30 g 3  . meclizine (ANTIVERT) 25 MG tablet Take 25 mg by mouth 3 (three) times daily as needed for dizziness.    . prochlorperazine (COMPAZINE) 10 MG tablet Take 1 tablet (10 mg total) by mouth every 6 (six) hours as needed for nausea or vomiting. 30 tablet 1  . sucralfate (CARAFATE) 1 g tablet Take 1 tablet (1 g total) by mouth 3 (three) times daily. Dissolve in 2-3 tbsp warm water, swish and swallow 90 tablet 3  . traMADol-acetaminophen (ULTRACET) 37.5-325 MG tablet Take 1-2 tablets by mouth every 4 (four) hours as needed for moderate pain. 30 tablet 0  . Umeclidinium-Vilanterol (ANORO ELLIPTA) 62.5-25 MCG/INH AEPB Inhale 1 puff into the lungs daily. 180 each 1   No current facility-administered medications for this visit.    OBJECTIVE: BP 127/80 mmHg  Pulse 82  Temp(Src) 97.6 F (36.4 C) (Oral)  Resp 18  Wt 166 lb 10.7 oz (75.6 kg)   Body mass index is 30.48 kg/(m^2).    ECOG FS:0 - Asymptomatic  General: Well-developed, well-nourished, no acute distress. Slightly anxious. Eyes: Pink conjunctiva, anicteric  sclera. Lungs: Clear to auscultation bilaterally. Heart: Regular rate and rhythm. No rubs, murmurs, or gallops. Abdomen: Soft, nontender, nondistended. No organomegaly noted, normoactive bowel sounds. Musculoskeletal: No edema, cyanosis, or clubbing. Neuro: Alert, answering all questions appropriately. Cranial nerves grossly intact. Skin: No rashes or petechiae noted. Psych: Normal affect.   LAB RESULTS:  Clinical Support on 01/08/2016  Component Date Value Ref Range Status  . WBC 01/08/2016 3.8  3.6 - 11.0 K/uL Final  . RBC 01/08/2016 4.16  3.80 - 5.20 MIL/uL Final  . Hemoglobin 01/08/2016 12.3  12.0 - 16.0 g/dL Final  . HCT 01/08/2016 35.6  35.0 - 47.0 % Final  . MCV 01/08/2016 85.6  80.0 - 100.0 fL Final  .  MCH 01/08/2016 29.6  26.0 - 34.0 pg Final  . MCHC 01/08/2016 34.6  32.0 - 36.0 g/dL Final  . RDW 01/08/2016 14.3  11.5 - 14.5 % Final  . Platelets 01/08/2016 325  150 - 440 K/uL Final  . Neutrophils Relative % 01/08/2016 68   Final  . Neutro Abs 01/08/2016 2.6  1.4 - 6.5 K/uL Final  . Lymphocytes Relative 01/08/2016 12   Final  . Lymphs Abs 01/08/2016 0.5* 1.0 - 3.6 K/uL Final  . Monocytes Relative 01/08/2016 16   Final  . Monocytes Absolute 01/08/2016 0.6  0.2 - 0.9 K/uL Final  . Eosinophils Relative 01/08/2016 1   Final  . Eosinophils Absolute 01/08/2016 0.0  0 - 0.7 K/uL Final  . Basophils Relative 01/08/2016 3   Final  . Basophils Absolute 01/08/2016 0.1  0 - 0.1 K/uL Final  . Sodium 01/08/2016 136  135 - 145 mmol/L Final  . Potassium 01/08/2016 4.1  3.5 - 5.1 mmol/L Final  . Chloride 01/08/2016 104  101 - 111 mmol/L Final  . CO2 01/08/2016 26  22 - 32 mmol/L Final  . Glucose, Bld 01/08/2016 103* 65 - 99 mg/dL Final  . BUN 01/08/2016 16  6 - 20 mg/dL Final  . Creatinine, Ser 01/08/2016 0.66  0.44 - 1.00 mg/dL Final  . Calcium 01/08/2016 9.5  8.9 - 10.3 mg/dL Final  . Total Protein 01/08/2016 7.4  6.5 - 8.1 g/dL Final  . Albumin 01/08/2016 3.4* 3.5 - 5.0 g/dL Final   . AST 01/08/2016 17  15 - 41 U/L Final  . ALT 01/08/2016 12* 14 - 54 U/L Final  . Alkaline Phosphatase 01/08/2016 67  38 - 126 U/L Final  . Total Bilirubin 01/08/2016 0.2* 0.3 - 1.2 mg/dL Final  . GFR calc non Af Amer 01/08/2016 >60  >60 mL/min Final  . GFR calc Af Amer 01/08/2016 >60  >60 mL/min Final   Comment: (NOTE) The eGFR has been calculated using the CKD EPI equation. This calculation has not been validated in all clinical situations. eGFR's persistently <60 mL/min signify possible Chronic Kidney Disease.   . Anion gap 01/08/2016 6  5 - 15 Final    STUDIES: No results found.  ASSESSMENT:  Anal carcinoma, stage II, T2 N0 M0.  PLAN:   1. Anal carcinoma: Continue daily XRT. Patient has now completed 14 of 30 treatments. She received one infusion of  5-FU and mitomycin approximately 2 weeks ago. Patient was given a delay of her XRT secondary to thrombocytopenia which has now resolved. She also had increased nausea and mouth sores, but these have resolved as well. Return to clinic on Jan 26, 2016 receive cycle 2 of mitomycin and 5-FU pump on days 1- 4.   2. Nausea: Resolved, monitor. 3. Weakness and fatigue: Multifactorial, monitor.  Patient expressed understanding and was in agreement with this plan. She also understands that She can call clinic at any time with any questions, concerns, or complaints.    Squamous cell carcinoma of anus (HCC)   Staging form: Anus, AJCC 7th Edition     Clinical stage from 11/28/2015: Stage II (T2, N0, M0) - Signed by Lloyd Huger, MD on 11/28/2015   Lloyd Huger, MD   01/11/2016 10:30 PM

## 2016-01-12 ENCOUNTER — Ambulatory Visit
Admission: RE | Admit: 2016-01-12 | Discharge: 2016-01-12 | Disposition: A | Discharge status: Home / Self Care | Payer: Medicare PPO | Source: Ambulatory Visit | Attending: Radiation Oncology | Admitting: Radiation Oncology

## 2016-01-12 ENCOUNTER — Inpatient Hospital Stay: Payer: Medicare PPO

## 2016-01-12 ENCOUNTER — Encounter: Payer: Self-pay | Admitting: Surgery

## 2016-01-12 ENCOUNTER — Ambulatory Visit (INDEPENDENT_AMBULATORY_CARE_PROVIDER_SITE_OTHER): Payer: Medicare PPO | Admitting: Surgery

## 2016-01-12 VITALS — BP 155/79 | HR 97 | Temp 98.5°F | Ht 62.0 in | Wt 166.0 lb

## 2016-01-12 DIAGNOSIS — Z510 Encounter for antineoplastic radiation therapy: Secondary | ICD-10-CM | POA: Diagnosis not present

## 2016-01-12 DIAGNOSIS — Z09 Encounter for follow-up examination after completed treatment for conditions other than malignant neoplasm: Secondary | ICD-10-CM

## 2016-01-12 DIAGNOSIS — C210 Malignant neoplasm of anus, unspecified: Secondary | ICD-10-CM | POA: Diagnosis not present

## 2016-01-12 LAB — CBC
HEMATOCRIT: 38.2 % (ref 35.0–47.0)
HEMOGLOBIN: 13 g/dL (ref 12.0–16.0)
MCH: 29.6 pg (ref 26.0–34.0)
MCHC: 34 g/dL (ref 32.0–36.0)
MCV: 86.9 fL (ref 80.0–100.0)
Platelets: 355 10*3/uL (ref 150–440)
RBC: 4.4 MIL/uL (ref 3.80–5.20)
RDW: 14.3 % (ref 11.5–14.5)
WBC: 4.2 10*3/uL (ref 3.6–11.0)

## 2016-01-12 NOTE — Progress Notes (Signed)
Anal CA s/p port Port working well  PE NAD Port in place, no infection  A/P doing well Continue chemo and radiation

## 2016-01-13 ENCOUNTER — Ambulatory Visit
Admission: RE | Admit: 2016-01-13 | Discharge: 2016-01-13 | Disposition: A | Discharge status: Home / Self Care | Payer: Medicare PPO | Source: Ambulatory Visit | Attending: Radiation Oncology | Admitting: Radiation Oncology

## 2016-01-13 DIAGNOSIS — Z510 Encounter for antineoplastic radiation therapy: Secondary | ICD-10-CM | POA: Diagnosis not present

## 2016-01-14 ENCOUNTER — Ambulatory Visit: Payer: Medicare PPO

## 2016-01-15 ENCOUNTER — Ambulatory Visit
Admission: RE | Admit: 2016-01-15 | Discharge: 2016-01-15 | Disposition: A | Discharge status: Home / Self Care | Payer: Medicare PPO | Source: Ambulatory Visit | Attending: Radiation Oncology | Admitting: Radiation Oncology

## 2016-01-15 DIAGNOSIS — Z510 Encounter for antineoplastic radiation therapy: Secondary | ICD-10-CM | POA: Diagnosis not present

## 2016-01-16 ENCOUNTER — Ambulatory Visit
Admission: RE | Admit: 2016-01-16 | Discharge: 2016-01-16 | Disposition: A | Discharge status: Home / Self Care | Payer: Medicare PPO | Source: Ambulatory Visit | Attending: Radiation Oncology | Admitting: Radiation Oncology

## 2016-01-16 DIAGNOSIS — Z510 Encounter for antineoplastic radiation therapy: Secondary | ICD-10-CM | POA: Diagnosis not present

## 2016-01-19 ENCOUNTER — Ambulatory Visit
Admission: RE | Admit: 2016-01-19 | Discharge: 2016-01-19 | Disposition: A | Discharge status: Home / Self Care | Payer: Medicare PPO | Source: Ambulatory Visit | Attending: Radiation Oncology | Admitting: Radiation Oncology

## 2016-01-19 DIAGNOSIS — Z510 Encounter for antineoplastic radiation therapy: Secondary | ICD-10-CM | POA: Diagnosis not present

## 2016-01-20 ENCOUNTER — Other Ambulatory Visit: Payer: Self-pay | Admitting: *Deleted

## 2016-01-20 ENCOUNTER — Ambulatory Visit
Admission: RE | Admit: 2016-01-20 | Discharge: 2016-01-20 | Disposition: A | Discharge status: Home / Self Care | Payer: Medicare PPO | Source: Ambulatory Visit | Attending: Radiation Oncology | Admitting: Radiation Oncology

## 2016-01-20 DIAGNOSIS — Z510 Encounter for antineoplastic radiation therapy: Secondary | ICD-10-CM | POA: Diagnosis not present

## 2016-01-20 MED ORDER — PHENAZOPYRIDINE HCL 200 MG PO TABS: 200.0000 mg | ORAL_TABLET | Freq: Two times a day (BID) | ORAL | Status: DC

## 2016-01-21 ENCOUNTER — Ambulatory Visit
Admission: RE | Admit: 2016-01-21 | Discharge: 2016-01-21 | Disposition: A | Discharge status: Home / Self Care | Payer: Medicare PPO | Source: Ambulatory Visit | Attending: Radiation Oncology | Admitting: Radiation Oncology

## 2016-01-21 DIAGNOSIS — Z510 Encounter for antineoplastic radiation therapy: Secondary | ICD-10-CM | POA: Diagnosis not present

## 2016-01-22 ENCOUNTER — Ambulatory Visit
Admission: RE | Admit: 2016-01-22 | Discharge: 2016-01-22 | Disposition: A | Discharge status: Home / Self Care | Payer: Medicare PPO | Source: Ambulatory Visit | Attending: Radiation Oncology | Admitting: Radiation Oncology

## 2016-01-22 DIAGNOSIS — Z510 Encounter for antineoplastic radiation therapy: Secondary | ICD-10-CM | POA: Diagnosis not present

## 2016-01-23 ENCOUNTER — Ambulatory Visit: Payer: Medicare PPO

## 2016-01-23 ENCOUNTER — Ambulatory Visit
Admission: RE | Admit: 2016-01-23 | Discharge: 2016-01-23 | Disposition: A | Discharge status: Home / Self Care | Payer: Medicare PPO | Source: Ambulatory Visit | Attending: Radiation Oncology | Admitting: Radiation Oncology

## 2016-01-23 ENCOUNTER — Telehealth: Payer: Self-pay | Admitting: *Deleted

## 2016-01-23 DIAGNOSIS — Z510 Encounter for antineoplastic radiation therapy: Secondary | ICD-10-CM | POA: Diagnosis not present

## 2016-01-23 NOTE — Telephone Encounter (Signed)
Patient left message that she has a fever, when I returned her call and asked what her temp was 99.3. I advised her that was not considered a fever and to call back if it gets to 100.5. She said "so I can go ahead and get my radiation today?" I told her yes. I spoke with Isidore Moos and advised her of pt concerns and asaked that she check her temp when she comes in this afternoon.

## 2016-01-23 NOTE — Telephone Encounter (Signed)
Sounds good, thanks.

## 2016-01-26 ENCOUNTER — Inpatient Hospital Stay (HOSPITAL_BASED_OUTPATIENT_CLINIC_OR_DEPARTMENT_OTHER): Payer: Medicare PPO | Admitting: Oncology

## 2016-01-26 ENCOUNTER — Ambulatory Visit: Payer: Medicare PPO

## 2016-01-26 ENCOUNTER — Inpatient Hospital Stay: Payer: Medicare PPO | Attending: Hematology and Oncology

## 2016-01-26 ENCOUNTER — Inpatient Hospital Stay: Payer: Medicare PPO

## 2016-01-26 ENCOUNTER — Ambulatory Visit
Admission: RE | Admit: 2016-01-26 | Discharge: 2016-01-26 | Disposition: A | Discharge status: Home / Self Care | Payer: Medicare PPO | Source: Ambulatory Visit | Attending: Radiation Oncology | Admitting: Radiation Oncology

## 2016-01-26 VITALS — BP 129/73 | HR 87 | Temp 98.8°F | Wt 164.2 lb

## 2016-01-26 DIAGNOSIS — F419 Anxiety disorder, unspecified: Secondary | ICD-10-CM

## 2016-01-26 DIAGNOSIS — R531 Weakness: Secondary | ICD-10-CM | POA: Diagnosis not present

## 2016-01-26 DIAGNOSIS — F17210 Nicotine dependence, cigarettes, uncomplicated: Secondary | ICD-10-CM | POA: Insufficient documentation

## 2016-01-26 DIAGNOSIS — J449 Chronic obstructive pulmonary disease, unspecified: Secondary | ICD-10-CM

## 2016-01-26 DIAGNOSIS — M5136 Other intervertebral disc degeneration, lumbar region: Secondary | ICD-10-CM | POA: Insufficient documentation

## 2016-01-26 DIAGNOSIS — I10 Essential (primary) hypertension: Secondary | ICD-10-CM | POA: Insufficient documentation

## 2016-01-26 DIAGNOSIS — I2510 Atherosclerotic heart disease of native coronary artery without angina pectoris: Secondary | ICD-10-CM

## 2016-01-26 DIAGNOSIS — Z510 Encounter for antineoplastic radiation therapy: Secondary | ICD-10-CM | POA: Diagnosis not present

## 2016-01-26 DIAGNOSIS — R197 Diarrhea, unspecified: Secondary | ICD-10-CM

## 2016-01-26 DIAGNOSIS — R5383 Other fatigue: Secondary | ICD-10-CM | POA: Diagnosis not present

## 2016-01-26 DIAGNOSIS — J45909 Unspecified asthma, uncomplicated: Secondary | ICD-10-CM

## 2016-01-26 DIAGNOSIS — I252 Old myocardial infarction: Secondary | ICD-10-CM

## 2016-01-26 DIAGNOSIS — R110 Nausea: Secondary | ICD-10-CM | POA: Insufficient documentation

## 2016-01-26 DIAGNOSIS — R609 Edema, unspecified: Secondary | ICD-10-CM | POA: Insufficient documentation

## 2016-01-26 DIAGNOSIS — Z803 Family history of malignant neoplasm of breast: Secondary | ICD-10-CM

## 2016-01-26 DIAGNOSIS — Z807 Family history of other malignant neoplasms of lymphoid, hematopoietic and related tissues: Secondary | ICD-10-CM | POA: Insufficient documentation

## 2016-01-26 DIAGNOSIS — Q600 Renal agenesis, unilateral: Secondary | ICD-10-CM | POA: Diagnosis not present

## 2016-01-26 DIAGNOSIS — E785 Hyperlipidemia, unspecified: Secondary | ICD-10-CM

## 2016-01-26 DIAGNOSIS — Z86718 Personal history of other venous thrombosis and embolism: Secondary | ICD-10-CM | POA: Insufficient documentation

## 2016-01-26 DIAGNOSIS — Z79899 Other long term (current) drug therapy: Secondary | ICD-10-CM | POA: Diagnosis not present

## 2016-01-26 DIAGNOSIS — Z5111 Encounter for antineoplastic chemotherapy: Secondary | ICD-10-CM | POA: Insufficient documentation

## 2016-01-26 DIAGNOSIS — C210 Malignant neoplasm of anus, unspecified: Secondary | ICD-10-CM

## 2016-01-26 DIAGNOSIS — R300 Dysuria: Secondary | ICD-10-CM

## 2016-01-26 DIAGNOSIS — J439 Emphysema, unspecified: Secondary | ICD-10-CM | POA: Insufficient documentation

## 2016-01-26 LAB — CBC WITH DIFFERENTIAL/PLATELET
Basophils Absolute: 0 10*3/uL (ref 0–0.1)
Basophils Relative: 1 %
EOS ABS: 0.3 10*3/uL (ref 0–0.7)
EOS PCT: 6 %
HCT: 34.8 % — ABNORMAL LOW (ref 35.0–47.0)
Hemoglobin: 12 g/dL (ref 12.0–16.0)
LYMPHS ABS: 0.4 10*3/uL — AB (ref 1.0–3.6)
Lymphocytes Relative: 6 %
MCH: 30 pg (ref 26.0–34.0)
MCHC: 34.3 g/dL (ref 32.0–36.0)
MCV: 87.4 fL (ref 80.0–100.0)
MONO ABS: 0.8 10*3/uL (ref 0.2–0.9)
Monocytes Relative: 14 %
Neutro Abs: 4.3 10*3/uL (ref 1.4–6.5)
Neutrophils Relative %: 73 %
PLATELETS: 177 10*3/uL (ref 150–440)
RBC: 3.99 MIL/uL (ref 3.80–5.20)
RDW: 16.1 % — AB (ref 11.5–14.5)
WBC: 5.9 10*3/uL (ref 3.6–11.0)

## 2016-01-26 LAB — COMPREHENSIVE METABOLIC PANEL
ALT: 11 U/L — ABNORMAL LOW (ref 14–54)
ANION GAP: 9 (ref 5–15)
AST: 20 U/L (ref 15–41)
Albumin: 3.4 g/dL — ABNORMAL LOW (ref 3.5–5.0)
Alkaline Phosphatase: 67 U/L (ref 38–126)
BILIRUBIN TOTAL: 0.4 mg/dL (ref 0.3–1.2)
BUN: 12 mg/dL (ref 6–20)
CO2: 26 mmol/L (ref 22–32)
Calcium: 9.4 mg/dL (ref 8.9–10.3)
Chloride: 101 mmol/L (ref 101–111)
Creatinine, Ser: 0.76 mg/dL (ref 0.44–1.00)
Glucose, Bld: 113 mg/dL — ABNORMAL HIGH (ref 65–99)
POTASSIUM: 3.2 mmol/L — AB (ref 3.5–5.1)
Sodium: 136 mmol/L (ref 135–145)
TOTAL PROTEIN: 7.4 g/dL (ref 6.5–8.1)

## 2016-01-26 MED ORDER — SODIUM CHLORIDE 0.9% FLUSH
10.0000 mL | INTRAVENOUS | Status: DC | PRN
  Filled 2016-01-26: qty 10

## 2016-01-26 MED ORDER — SODIUM CHLORIDE 0.9 % IV SOLN
1000.0000 mg/m2/d | INTRAVENOUS | Status: DC
  Administered 2016-01-26: 7350 mg via INTRAVENOUS
  Filled 2016-01-26: qty 147

## 2016-01-26 MED ORDER — MITOMYCIN CHEMO IV INJECTION 20 MG
10.0000 mg/m2 | Freq: Once | INTRAVENOUS | Status: AC
Start: 2016-01-26 — End: 2016-01-26
  Administered 2016-01-26: 18.5 mg via INTRAVENOUS
  Filled 2016-01-26: qty 37

## 2016-01-26 MED ORDER — HEPARIN SOD (PORK) LOCK FLUSH 100 UNIT/ML IV SOLN: 500.0000 [IU] | Freq: Once | INTRAVENOUS | Status: DC

## 2016-01-26 MED ORDER — SODIUM CHLORIDE 0.9 % IV SOLN
Freq: Once | INTRAVENOUS | Status: AC
  Administered 2016-01-26: 11:00:00 via INTRAVENOUS
  Filled 2016-01-26: qty 1000

## 2016-01-26 MED ORDER — SODIUM CHLORIDE 0.9% FLUSH
10.0000 mL | INTRAVENOUS | Status: DC | PRN
  Administered 2016-01-26: 10 mL via INTRAVENOUS
  Filled 2016-01-26: qty 10

## 2016-01-26 MED ORDER — PROCHLORPERAZINE MALEATE 10 MG PO TABS
10.0000 mg | ORAL_TABLET | Freq: Once | ORAL | Status: AC
  Administered 2016-01-26: 10 mg via ORAL
  Filled 2016-01-26: qty 1

## 2016-01-26 NOTE — Progress Notes (Signed)
Patient is feeling an increase in her fatigue with a decrease in appetite.  She does have nausea everyday and also diarrhea usually 2-3 times a week so she will take Imodium that does relieve the diarrhea.  Having dysuria for the past month.

## 2016-01-27 ENCOUNTER — Ambulatory Visit: Payer: Medicare PPO

## 2016-01-27 ENCOUNTER — Ambulatory Visit: Admission: RE | Admit: 2016-01-27 | Payer: Medicare PPO | Source: Ambulatory Visit

## 2016-01-27 ENCOUNTER — Ambulatory Visit
Admission: RE | Admit: 2016-01-27 | Discharge: 2016-01-27 | Disposition: A | Discharge status: Home / Self Care | Payer: Medicare PPO | Source: Ambulatory Visit | Attending: Radiation Oncology | Admitting: Radiation Oncology

## 2016-01-28 ENCOUNTER — Ambulatory Visit
Admission: RE | Admit: 2016-01-28 | Discharge: 2016-01-28 | Disposition: A | Discharge status: Home / Self Care | Payer: Medicare PPO | Source: Ambulatory Visit | Attending: Radiation Oncology | Admitting: Radiation Oncology

## 2016-01-28 DIAGNOSIS — Z510 Encounter for antineoplastic radiation therapy: Secondary | ICD-10-CM | POA: Diagnosis not present

## 2016-01-29 ENCOUNTER — Ambulatory Visit
Admission: RE | Admit: 2016-01-29 | Discharge: 2016-01-29 | Disposition: A | Discharge status: Home / Self Care | Payer: Medicare PPO | Source: Ambulatory Visit | Attending: Radiation Oncology | Admitting: Radiation Oncology

## 2016-01-29 ENCOUNTER — Telehealth: Payer: Self-pay | Admitting: *Deleted

## 2016-01-29 DIAGNOSIS — Z510 Encounter for antineoplastic radiation therapy: Secondary | ICD-10-CM | POA: Diagnosis not present

## 2016-01-29 MED ORDER — ONDANSETRON HCL 8 MG PO TABS
8.0000 mg | ORAL_TABLET | Freq: Three times a day (TID) | ORAL | Status: DC | PRN
Start: 2016-01-29 — End: 2016-02-18

## 2016-01-29 NOTE — Telephone Encounter (Signed)
Got a call from Maudie Mercury that told me that she has been nauseated and she  Is taking her nausea med and it is not working. She is unable to drink and eat much due to nausea.  I spoke to [place] about pt since finnegan is out today.  When I started checking to see if she could have ivf tom. She was already on sch to have pump d/c and get fluids already on Friday. I did speak to Dr. Mike Gip and she felt like pt needs a different nausea pill and she prescribed zofran 8 mg every 8 hours to pt's pharmacy. I called pt and spoke to her daughter and then to the patient and she is agreeable to a new medicine and I sent it in electronically.  I did tell pt that she is on schedule for tom to take pump off and IVF. Pt was told that the time for appt if 1:45. She wil lbe there.

## 2016-01-30 ENCOUNTER — Ambulatory Visit
Admission: RE | Admit: 2016-01-30 | Discharge: 2016-01-30 | Disposition: A | Discharge status: Home / Self Care | Payer: Medicare PPO | Source: Ambulatory Visit | Attending: Radiation Oncology | Admitting: Radiation Oncology

## 2016-01-30 ENCOUNTER — Inpatient Hospital Stay: Payer: Medicare PPO

## 2016-01-30 ENCOUNTER — Ambulatory Visit: Payer: Medicare PPO

## 2016-01-30 DIAGNOSIS — C210 Malignant neoplasm of anus, unspecified: Secondary | ICD-10-CM

## 2016-01-30 DIAGNOSIS — Z5111 Encounter for antineoplastic chemotherapy: Secondary | ICD-10-CM | POA: Diagnosis not present

## 2016-01-30 DIAGNOSIS — Z510 Encounter for antineoplastic radiation therapy: Secondary | ICD-10-CM | POA: Diagnosis not present

## 2016-01-30 MED ORDER — HEPARIN SOD (PORK) LOCK FLUSH 100 UNIT/ML IV SOLN
500.0000 [IU] | Freq: Once | INTRAVENOUS | Status: AC | PRN
  Administered 2016-01-30: 500 [IU]
  Filled 2016-01-30: qty 5

## 2016-01-30 MED ORDER — SODIUM CHLORIDE 0.9 % IV SOLN
Freq: Once | INTRAVENOUS | Status: AC
  Administered 2016-01-30: 15:00:00 via INTRAVENOUS
  Filled 2016-01-30: qty 1000

## 2016-01-30 MED ORDER — SODIUM CHLORIDE 0.9% FLUSH
10.0000 mL | INTRAVENOUS | Status: DC | PRN
Start: 2016-01-30 — End: 2016-01-30
  Filled 2016-01-30: qty 10

## 2016-02-02 ENCOUNTER — Ambulatory Visit: Payer: Medicare PPO

## 2016-02-02 NOTE — Progress Notes (Signed)
Carol Gay  Telephone:(336) [phone]  Fax:(336) [phone]     Carol Gay DOB: Jul 17, 1946  MR#: [id number]  JSE#:[id number]  Patient Care Team: Glean Hess, MD as PCP - General (Family Medicine) Clent Jacks, RN as Registered Nurse  CHIEF COMPLAINT:  Chief Complaint  Patient presents with  . anal cancer    INTERVAL HISTORY:   Patient returns to clinic today for further evaluation and consideration of her second infusion of mitomycin and 5-FU. She has increased fatigue and decreased appetite. She also has nausea and diarrhea, that is better controlled with her current medication regimen. She also has some mild dysuria. She does not complain of pain or bleeding at this time. She has no neurologic complaints. She denies any recent fevers. She has no chest pain or shortness of breath. Patient is mildly anxious today. Patient offers no further specific complaints today.  REVIEW OF SYSTEMS:   Review of Systems  Constitutional: Positive for malaise/fatigue. Negative for fever, weight loss and diaphoresis.  HENT: Negative.   Eyes: Negative.   Respiratory: Negative.  Negative for cough and shortness of breath.   Cardiovascular: Negative.  Negative for chest pain and leg swelling.  Gastrointestinal: Positive for diarrhea. Negative for heartburn, vomiting, abdominal pain, constipation, blood in stool and melena.  Genitourinary: Negative.   Musculoskeletal: Negative.   Skin: Negative.   Neurological: Positive for weakness.  Endo/Heme/Allergies: Does not bruise/bleed easily.  Psychiatric/Behavioral: The patient is nervous/anxious.     As per HPI. Otherwise, a complete review of systems is negatve.   PAST MEDICAL HISTORY: Past Medical History  Diagnosis Date  . Cigarette smoker   . Hypertension   . Arm pain   . Rectal bleeding   . COPD (chronic obstructive pulmonary disease) ([place])   . Hemorrhoids   . CAD in native artery   . Pulmonary emphysema ([place])     . Dyslipidemia   . Dermatophytoses   . DDD (degenerative disc disease), lumbar        . Aortic atherosclerosis ([place])   . H/O blood clots     left leg, veins stripped  . Shortness of breath dyspnea   . Smokers' cough ([place])   . Myocardial infarction [facility])     "mild" - age 65  . Congenital absence of one kidney     born with only right kidney  . Vertigo   . Wears dentures     has full upper and lower - doesn't wear  . Asthma   . Claustrophobia   . Last menstrual period (LMP) > 10 days ago 1994    PAST SURGICAL HISTORY: Past Surgical History  Procedure Laterality Date  . Vein ligation and stripping    . Ovarian cyst removal Left   . Appendectomy    . Lumbar disc surgery  1997  . Breast biopsy    . Exploratory laparotomy    . Lesion excision  11/03/15    Anal - Dr Dahlia Byes, [facility]  . Colonoscopy with propofol N/A 11/06/2015    Procedure: COLONOSCOPY WITH PROPOFOL;  Surgeon: Lucilla Lame, MD;  Location: [place];  Service: Endoscopy;  Laterality: N/A;  LEAVE PT EARLY  . Polypectomy  11/06/2015    Procedure: POLYPECTOMY;  Surgeon: Lucilla Lame, MD;  Location: [place];  Service: Endoscopy;;  . Portacath placement N/A 12/10/2015    Procedure: INSERTION PORT-A-CATH;  Surgeon: Jules Husbands, MD;  Location: [facility];  Service: General;  Laterality: N/A;  FAMILY HISTORY Family History  Problem Relation Age of Onset  . Cancer Mother     lymphoma  . Heart disease Mother   . Cancer Maternal Grandmother     breast    GYNECOLOGIC HISTORY:  No LMP recorded. Patient is postmenopausal.     ADVANCED DIRECTIVES:    HEALTH MAINTENANCE: Social History  Substance Use Topics  . Smoking status: Current Every Day Smoker -- 1.00 packs/day for 57 years    Types: Cigarettes  . Smokeless tobacco: Not on file     Comment: not quite ready  . Alcohol Use: No     Allergies  Allergen Reactions  . Aspirin Nausea And Vomiting  . Codeine Nausea And Vomiting  .  Morphine And Related Nausea And Vomiting    Current Outpatient Prescriptions  Medication Sig Dispense Refill  . acetaminophen (TYLENOL) 500 MG tablet Take 1,000 mg by mouth every 6 (six) hours as needed.    Marland Kitchen albuterol (PROVENTIL HFA;VENTOLIN HFA) 108 (90 Base) MCG/ACT inhaler Inhale 2 puffs into the lungs every 6 (six) hours as needed for wheezing or shortness of breath. 3 Inhaler 1  . ALPRAZolam (XANAX) 0.25 MG tablet Take 1 tablet (0.25 mg total) by mouth 2 (two) times daily as needed for anxiety. 60 tablet 0  . amLODipine (NORVASC) 5 MG tablet Take 1 tablet (5 mg total) by mouth daily. 90 tablet 1  . atorvastatin (LIPITOR) 20 MG tablet Take 1 tablet (20 mg total) by mouth at bedtime. 90 tablet 1  . lidocaine-prilocaine (EMLA) cream Apply 1 application topically as needed. 30 g 3  . meclizine (ANTIVERT) 25 MG tablet Take 25 mg by mouth 3 (three) times daily as needed for dizziness.    . phenazopyridine (PYRIDIUM) 200 MG tablet Take 1 tablet (200 mg total) by mouth 2 (two) times daily. 30 tablet 1  . prochlorperazine (COMPAZINE) 10 MG tablet Take 1 tablet (10 mg total) by mouth every 6 (six) hours as needed for nausea or vomiting. 30 tablet 1  . sucralfate (CARAFATE) 1 g tablet Take 1 tablet (1 g total) by mouth 3 (three) times daily. Dissolve in 2-3 tbsp warm water, swish and swallow 90 tablet 3  . traMADol-acetaminophen (ULTRACET) 37.5-325 MG tablet Take 1-2 tablets by mouth every 4 (four) hours as needed for moderate pain. 30 tablet 0  . Umeclidinium-Vilanterol (ANORO ELLIPTA) 62.5-25 MCG/INH AEPB Inhale 1 puff into the lungs daily. 180 each 1  . ondansetron (ZOFRAN) 8 MG tablet Take 1 tablet (8 mg total) by mouth every 8 (eight) hours as needed for nausea or vomiting. 20 tablet 0   No current facility-administered medications for this visit.    OBJECTIVE: BP 129/73 mmHg  Pulse 87  Temp(Src) 98.8 F (37.1 C) (Tympanic)  Wt 164 lb 3.9 oz (74.5 kg)   Body mass index is 30.03 kg/(m^2).     ECOG FS:0 - Asymptomatic  General: Well-developed, well-nourished, no acute distress. Slightly anxious. Eyes: Pink conjunctiva, anicteric sclera. Lungs: Clear to auscultation bilaterally. Heart: Regular rate and rhythm. No rubs, murmurs, or gallops. Abdomen: Soft, nontender, nondistended. No organomegaly noted, normoactive bowel sounds. Musculoskeletal: No edema, cyanosis, or clubbing. Neuro: Alert, answering all questions appropriately. Cranial nerves grossly intact. Skin: No rashes or petechiae noted. Psych: Normal affect.   LAB RESULTS:  Infusion on 01/26/2016  Component Date Value Ref Range Status  . WBC 01/26/2016 5.9  3.6 - 11.0 K/uL Final  . RBC 01/26/2016 3.99  3.80 - 5.20 MIL/uL Final  .  Hemoglobin 01/26/2016 12.0  12.0 - 16.0 g/dL Final  . HCT 01/26/2016 34.8* 35.0 - 47.0 % Final  . MCV 01/26/2016 87.4  80.0 - 100.0 fL Final  . MCH 01/26/2016 30.0  26.0 - 34.0 pg Final  . MCHC 01/26/2016 34.3  32.0 - 36.0 g/dL Final  . RDW 01/26/2016 16.1* 11.5 - 14.5 % Final  . Platelets 01/26/2016 177  150 - 440 K/uL Final  . Neutrophils Relative % 01/26/2016 73   Final  . Neutro Abs 01/26/2016 4.3  1.4 - 6.5 K/uL Final  . Lymphocytes Relative 01/26/2016 6   Final  . Lymphs Abs 01/26/2016 0.4* 1.0 - 3.6 K/uL Final  . Monocytes Relative 01/26/2016 14   Final  . Monocytes Absolute 01/26/2016 0.8  0.2 - 0.9 K/uL Final  . Eosinophils Relative 01/26/2016 6   Final  . Eosinophils Absolute 01/26/2016 0.3  0 - 0.7 K/uL Final  . Basophils Relative 01/26/2016 1   Final  . Basophils Absolute 01/26/2016 0.0  0 - 0.1 K/uL Final  . Sodium 01/26/2016 136  135 - 145 mmol/L Final  . Potassium 01/26/2016 3.2* 3.5 - 5.1 mmol/L Final  . Chloride 01/26/2016 101  101 - 111 mmol/L Final  . CO2 01/26/2016 26  22 - 32 mmol/L Final  . Glucose, Bld 01/26/2016 113* 65 - 99 mg/dL Final  . BUN 01/26/2016 12  6 - 20 mg/dL Final  . Creatinine, Ser 01/26/2016 0.76  0.44 - 1.00 mg/dL Final  . Calcium 01/26/2016  9.4  8.9 - 10.3 mg/dL Final  . Total Protein 01/26/2016 7.4  6.5 - 8.1 g/dL Final  . Albumin 01/26/2016 3.4* 3.5 - 5.0 g/dL Final  . AST 01/26/2016 20  15 - 41 U/L Final  . ALT 01/26/2016 11* 14 - 54 U/L Final  . Alkaline Phosphatase 01/26/2016 67  38 - 126 U/L Final  . Total Bilirubin 01/26/2016 0.4  0.3 - 1.2 mg/dL Final  . GFR calc non Af Amer 01/26/2016 >60  >60 mL/min Final  . GFR calc Af Amer 01/26/2016 >60  >60 mL/min Final   Comment: (NOTE) The eGFR has been calculated using the CKD EPI equation. This calculation has not been validated in all clinical situations. eGFR's persistently <60 mL/min signify possible Chronic Kidney Disease.   . Anion gap 01/26/2016 9  5 - 15 Final    STUDIES: No results found.  ASSESSMENT:  Anal carcinoma, stage II, T2 N0 M0.  PLAN:   1. Anal carcinoma: Continue daily XRT, her final treatment is on Feb 04, 2016. Patient's second infusion of 5-FU and mitomycin should be coordinated with the end of her XRT, but this was delayed. Patient has requested to proceed with treatment today. Return to clinic in 5 days for pump removal. She will then return to clinic in 3 weeks for further evaluation. Plan to do a PET scan on March 12, 2016 approximately 5-6 weeks after conclusion of her XRT.  2. Nausea: Continue current antiemetic regimen. 3. Weakness and fatigue: Multifactorial, monitor.  4. Diarrhea: Continue OTC Imodium.  Patient expressed understanding and was in agreement with this plan. She also understands that She can call clinic at any time with any questions, concerns, or complaints.    Squamous cell carcinoma of anus (HCC)   Staging form: Anus, AJCC 7th Edition     Clinical stage from 11/28/2015: Stage II (T2, N0, M0) - Signed by Lloyd Huger, MD on 11/28/2015   Lloyd Huger, MD  02/02/2016 11:41 AM

## 2016-02-03 ENCOUNTER — Other Ambulatory Visit: Payer: Self-pay | Admitting: *Deleted

## 2016-02-03 ENCOUNTER — Ambulatory Visit: Payer: Medicare PPO

## 2016-02-03 DIAGNOSIS — C210 Malignant neoplasm of anus, unspecified: Secondary | ICD-10-CM

## 2016-02-03 MED ORDER — SILVER SULFADIAZINE 1 % EX CREA: 1.0000 "application " | TOPICAL_CREAM | Freq: Two times a day (BID) | CUTANEOUS | Status: DC

## 2016-02-04 ENCOUNTER — Ambulatory Visit: Payer: Medicare PPO

## 2016-02-08 ENCOUNTER — Emergency Department: Payer: Medicare PPO

## 2016-02-08 ENCOUNTER — Encounter: Payer: Self-pay | Admitting: Emergency Medicine

## 2016-02-08 ENCOUNTER — Inpatient Hospital Stay
Admission: EM | Admit: 2016-02-08 | Discharge: 2016-02-11 | DRG: 393 | Disposition: A | Discharge status: Home / Self Care | Payer: Medicare PPO | Attending: Specialist | Admitting: Specialist

## 2016-02-08 DIAGNOSIS — T451X5A Adverse effect of antineoplastic and immunosuppressive drugs, initial encounter: Secondary | ICD-10-CM | POA: Diagnosis present

## 2016-02-08 DIAGNOSIS — A09 Infectious gastroenteritis and colitis, unspecified: Secondary | ICD-10-CM | POA: Diagnosis present

## 2016-02-08 DIAGNOSIS — R197 Diarrhea, unspecified: Secondary | ICD-10-CM

## 2016-02-08 DIAGNOSIS — Z9049 Acquired absence of other specified parts of digestive tract: Secondary | ICD-10-CM | POA: Diagnosis not present

## 2016-02-08 DIAGNOSIS — Z885 Allergy status to narcotic agent status: Secondary | ICD-10-CM | POA: Diagnosis not present

## 2016-02-08 DIAGNOSIS — D899 Disorder involving the immune mechanism, unspecified: Secondary | ICD-10-CM

## 2016-02-08 DIAGNOSIS — Z79899 Other long term (current) drug therapy: Secondary | ICD-10-CM | POA: Diagnosis not present

## 2016-02-08 DIAGNOSIS — Z9221 Personal history of antineoplastic chemotherapy: Secondary | ICD-10-CM

## 2016-02-08 DIAGNOSIS — J449 Chronic obstructive pulmonary disease, unspecified: Secondary | ICD-10-CM | POA: Diagnosis present

## 2016-02-08 DIAGNOSIS — J45909 Unspecified asthma, uncomplicated: Secondary | ICD-10-CM | POA: Diagnosis present

## 2016-02-08 DIAGNOSIS — Z923 Personal history of irradiation: Secondary | ICD-10-CM

## 2016-02-08 DIAGNOSIS — I10 Essential (primary) hypertension: Secondary | ICD-10-CM | POA: Diagnosis present

## 2016-02-08 DIAGNOSIS — D61818 Other pancytopenia: Secondary | ICD-10-CM | POA: Diagnosis present

## 2016-02-08 DIAGNOSIS — K521 Toxic gastroenteritis and colitis: Principal | ICD-10-CM | POA: Diagnosis present

## 2016-02-08 DIAGNOSIS — F17210 Nicotine dependence, cigarettes, uncomplicated: Secondary | ICD-10-CM | POA: Diagnosis present

## 2016-02-08 DIAGNOSIS — Z85038 Personal history of other malignant neoplasm of large intestine: Secondary | ICD-10-CM | POA: Diagnosis not present

## 2016-02-08 DIAGNOSIS — Z803 Family history of malignant neoplasm of breast: Secondary | ICD-10-CM | POA: Diagnosis not present

## 2016-02-08 DIAGNOSIS — B9620 Unspecified Escherichia coli [E. coli] as the cause of diseases classified elsewhere: Secondary | ICD-10-CM | POA: Diagnosis present

## 2016-02-08 DIAGNOSIS — E860 Dehydration: Secondary | ICD-10-CM | POA: Diagnosis present

## 2016-02-08 DIAGNOSIS — M5136 Other intervertebral disc degeneration, lumbar region: Secondary | ICD-10-CM | POA: Diagnosis present

## 2016-02-08 DIAGNOSIS — Z9889 Other specified postprocedural states: Secondary | ICD-10-CM

## 2016-02-08 DIAGNOSIS — D72819 Decreased white blood cell count, unspecified: Secondary | ICD-10-CM

## 2016-02-08 DIAGNOSIS — Z886 Allergy status to analgesic agent status: Secondary | ICD-10-CM

## 2016-02-08 DIAGNOSIS — Z9851 Tubal ligation status: Secondary | ICD-10-CM

## 2016-02-08 DIAGNOSIS — E876 Hypokalemia: Secondary | ICD-10-CM | POA: Diagnosis present

## 2016-02-08 DIAGNOSIS — I252 Old myocardial infarction: Secondary | ICD-10-CM | POA: Diagnosis not present

## 2016-02-08 DIAGNOSIS — C210 Malignant neoplasm of anus, unspecified: Secondary | ICD-10-CM | POA: Diagnosis present

## 2016-02-08 DIAGNOSIS — Z66 Do not resuscitate: Secondary | ICD-10-CM | POA: Diagnosis present

## 2016-02-08 DIAGNOSIS — R531 Weakness: Secondary | ICD-10-CM

## 2016-02-08 DIAGNOSIS — Z8249 Family history of ischemic heart disease and other diseases of the circulatory system: Secondary | ICD-10-CM | POA: Diagnosis not present

## 2016-02-08 DIAGNOSIS — D61810 Antineoplastic chemotherapy induced pancytopenia: Secondary | ICD-10-CM | POA: Diagnosis present

## 2016-02-08 DIAGNOSIS — D849 Immunodeficiency, unspecified: Secondary | ICD-10-CM

## 2016-02-08 DIAGNOSIS — D696 Thrombocytopenia, unspecified: Secondary | ICD-10-CM | POA: Diagnosis present

## 2016-02-08 DIAGNOSIS — I2510 Atherosclerotic heart disease of native coronary artery without angina pectoris: Secondary | ICD-10-CM | POA: Diagnosis present

## 2016-02-08 LAB — COMPREHENSIVE METABOLIC PANEL
ALBUMIN: 2.7 g/dL — AB (ref 3.5–5.0)
ALK PHOS: 53 U/L (ref 38–126)
ALT: 10 U/L — AB (ref 14–54)
AST: 24 U/L (ref 15–41)
Anion gap: 8 (ref 5–15)
BUN: 16 mg/dL (ref 6–20)
CALCIUM: 8.5 mg/dL — AB (ref 8.9–10.3)
CHLORIDE: 97 mmol/L — AB (ref 101–111)
CO2: 28 mmol/L (ref 22–32)
CREATININE: 0.84 mg/dL (ref 0.44–1.00)
GFR calc Af Amer: >60 mL/min (ref >60)
GFR calc non Af Amer: >60 mL/min (ref >60)
GLUCOSE: 103 mg/dL — AB (ref 65–99)
Potassium: 3.1 mmol/L — ABNORMAL LOW (ref 3.5–5.1)
SODIUM: 133 mmol/L — AB (ref 135–145)
Total Bilirubin: 0.7 mg/dL (ref 0.3–1.2)
Total Protein: 6.6 g/dL (ref 6.5–8.1)

## 2016-02-08 LAB — URINALYSIS COMPLETE WITH MICROSCOPIC (ARMC ONLY)
BACTERIA UA: NONE SEEN
Bilirubin Urine: NEGATIVE
Glucose, UA: NEGATIVE mg/dL
LEUKOCYTES UA: NEGATIVE
NITRITE: NEGATIVE
PH: 5 (ref 5.0–8.0)
PROTEIN: 100 mg/dL — AB
SPECIFIC GRAVITY, URINE: 1.031 — AB (ref 1.005–1.030)
SQUAMOUS EPITHELIAL / LPF: NONE SEEN

## 2016-02-08 LAB — HEMOGLOBIN A1C: Hgb A1c MFr Bld: 5.8 % (ref 4.0–6.0)

## 2016-02-08 LAB — DIFFERENTIAL
BASOS PCT: 0 %
Basophils Absolute: 0 10*3/uL (ref 0–0.1)
EOS ABS: 0 10*3/uL (ref 0–0.7)
EOS PCT: 1 %
Lymphocytes Relative: 12 %
Lymphs Abs: 0.1 10*3/uL — ABNORMAL LOW (ref 1.0–3.6)
MONO ABS: 0.5 10*3/uL (ref 0.2–0.9)
MONOS PCT: 52 %
NEUTROS ABS: 0.4 10*3/uL — AB (ref 1.4–6.5)
NEUTROS PCT: 35 %

## 2016-02-08 LAB — GASTROINTESTINAL PANEL BY PCR, STOOL (REPLACES STOOL CULTURE)
ADENOVIRUS F40/41: NOT DETECTED
Astrovirus: NOT DETECTED
CAMPYLOBACTER SPECIES: NOT DETECTED
CRYPTOSPORIDIUM: NOT DETECTED
Cyclospora cayetanensis: NOT DETECTED
E. coli O157: NOT DETECTED
ENTAMOEBA HISTOLYTICA: NOT DETECTED
ENTEROAGGREGATIVE E COLI (EAEC): NOT DETECTED
ENTEROPATHOGENIC E COLI (EPEC): DETECTED — AB
Enterotoxigenic E coli (ETEC): NOT DETECTED
GIARDIA LAMBLIA: NOT DETECTED
NOROVIRUS GI/GII: NOT DETECTED
PLESIMONAS SHIGELLOIDES: NOT DETECTED
Rotavirus A: NOT DETECTED
Salmonella species: NOT DETECTED
Sapovirus (I, II, IV, and V): NOT DETECTED
Shiga like toxin producing E coli (STEC): NOT DETECTED
Shigella/Enteroinvasive E coli (EIEC): NOT DETECTED
VIBRIO CHOLERAE: NOT DETECTED
Vibrio species: NOT DETECTED
YERSINIA ENTEROCOLITICA: NOT DETECTED

## 2016-02-08 LAB — CBC
HCT: 30.2 % — ABNORMAL LOW (ref 35.0–47.0)
Hemoglobin: 10.2 g/dL — ABNORMAL LOW (ref 12.0–16.0)
MCH: 29.7 pg (ref 26.0–34.0)
MCHC: 33.8 g/dL (ref 32.0–36.0)
MCV: 87.9 fL (ref 80.0–100.0)
PLATELETS: 32 10*3/uL — AB (ref 150–440)
RBC: 3.44 MIL/uL — ABNORMAL LOW (ref 3.80–5.20)
RDW: 17.4 % — ABNORMAL HIGH (ref 11.5–14.5)
WBC: 1.1 10*3/uL — CL (ref 3.6–11.0)

## 2016-02-08 LAB — TSH: TSH: 4.638 u[IU]/mL — ABNORMAL HIGH (ref 0.350–4.500)

## 2016-02-08 LAB — GLUCOSE, CAPILLARY
Glucose-Capillary: 105 mg/dL — ABNORMAL HIGH (ref 65–99)
Glucose-Capillary: 125 mg/dL — ABNORMAL HIGH (ref 65–99)
Glucose-Capillary: 145 mg/dL — ABNORMAL HIGH (ref 65–99)

## 2016-02-08 LAB — C DIFFICILE QUICK SCREEN W PCR REFLEX
C DIFFICILE (CDIFF) TOXIN: NEGATIVE
C Diff antigen: NEGATIVE
C Diff interpretation: NEGATIVE

## 2016-02-08 LAB — LIPASE, BLOOD: Lipase: 16 U/L (ref 11–51)

## 2016-02-08 MED ORDER — SUCRALFATE 1 G PO TABS
1.0000 g | ORAL_TABLET | Freq: Three times a day (TID) | ORAL | Status: DC
  Administered 2016-02-08 – 2016-02-11 (×9): 1 g via ORAL
  Filled 2016-02-08 (×9): qty 1

## 2016-02-08 MED ORDER — ALBUTEROL SULFATE (2.5 MG/3ML) 0.083% IN NEBU
3.0000 mL | INHALATION_SOLUTION | Freq: Four times a day (QID) | RESPIRATORY_TRACT | Status: DC | PRN
  Administered 2016-02-10 – 2016-02-11 (×2): 3 mL via RESPIRATORY_TRACT
  Filled 2016-02-08 (×2): qty 3

## 2016-02-08 MED ORDER — MORPHINE SULFATE (PF) 2 MG/ML IV SOLN: 1.0000 mg | INTRAVENOUS | Status: DC | PRN

## 2016-02-08 MED ORDER — AMLODIPINE BESYLATE 5 MG PO TABS
5.0000 mg | ORAL_TABLET | Freq: Every day | ORAL | Status: DC
  Administered 2016-02-08 – 2016-02-11 (×4): 5 mg via ORAL
  Filled 2016-02-08 (×4): qty 1

## 2016-02-08 MED ORDER — MECLIZINE HCL 25 MG PO TABS: 25.0000 mg | ORAL_TABLET | Freq: Three times a day (TID) | ORAL | Status: DC | PRN

## 2016-02-08 MED ORDER — ALPRAZOLAM 0.25 MG PO TABS
0.2500 mg | ORAL_TABLET | Freq: Two times a day (BID) | ORAL | Status: DC | PRN
  Administered 2016-02-10 (×3): 0.25 mg via ORAL
  Filled 2016-02-08 (×3): qty 1

## 2016-02-08 MED ORDER — INSULIN ASPART 100 UNIT/ML ~~LOC~~ SOLN: 0.0000 [IU] | Freq: Three times a day (TID) | SUBCUTANEOUS | Status: DC

## 2016-02-08 MED ORDER — SODIUM CHLORIDE 0.9 % IV BOLUS (SEPSIS)
1000.0000 mL | INTRAVENOUS | Status: AC
  Administered 2016-02-08: 1000 mL via INTRAVENOUS

## 2016-02-08 MED ORDER — METHYLPREDNISOLONE SODIUM SUCC 125 MG IJ SOLR
60.0000 mg | INTRAMUSCULAR | Status: AC
  Administered 2016-02-08: 60 mg via INTRAVENOUS

## 2016-02-08 MED ORDER — UMECLIDINIUM-VILANTEROL 62.5-25 MCG/INH IN AEPB
1.0000 | INHALATION_SPRAY | Freq: Every day | RESPIRATORY_TRACT | Status: DC
  Administered 2016-02-08 – 2016-02-11 (×4): 1 via RESPIRATORY_TRACT
  Filled 2016-02-08: qty 14

## 2016-02-08 MED ORDER — PHENAZOPYRIDINE HCL 200 MG PO TABS
200.0000 mg | ORAL_TABLET | Freq: Two times a day (BID) | ORAL | Status: DC
Start: 2016-02-08 — End: 2016-02-09
  Administered 2016-02-08 – 2016-02-09 (×3): 200 mg via ORAL
  Filled 2016-02-08 (×5): qty 1

## 2016-02-08 MED ORDER — TRAMADOL-ACETAMINOPHEN 37.5-325 MG PO TABS
1.0000 | ORAL_TABLET | ORAL | Status: DC | PRN
  Administered 2016-02-10: 2 via ORAL
  Administered 2016-02-10: 1 via ORAL
  Administered 2016-02-11 (×2): 2 via ORAL
  Filled 2016-02-08 (×2): qty 2
  Filled 2016-02-08: qty 1
  Filled 2016-02-08: qty 2

## 2016-02-08 MED ORDER — CIPROFLOXACIN HCL 500 MG PO TABS
250.0000 mg | ORAL_TABLET | Freq: Two times a day (BID) | ORAL | Status: DC
Start: 2016-02-08 — End: 2016-02-09
  Administered 2016-02-08 – 2016-02-09 (×2): 250 mg via ORAL
  Filled 2016-02-08: qty 2
  Filled 2016-02-08 (×2): qty 1

## 2016-02-08 MED ORDER — ONDANSETRON HCL 4 MG PO TABS: 8.0000 mg | ORAL_TABLET | Freq: Three times a day (TID) | ORAL | Status: DC | PRN

## 2016-02-08 MED ORDER — ACETAMINOPHEN 500 MG PO TABS
1000.0000 mg | ORAL_TABLET | Freq: Four times a day (QID) | ORAL | Status: DC | PRN
  Administered 2016-02-08 – 2016-02-09 (×2): 1000 mg via ORAL
  Filled 2016-02-08 (×2): qty 2

## 2016-02-08 MED ORDER — SILVER SULFADIAZINE 1 % EX CREA
1.0000 "application " | TOPICAL_CREAM | Freq: Two times a day (BID) | CUTANEOUS | Status: DC
  Administered 2016-02-08 – 2016-02-11 (×7): 1 via TOPICAL
  Filled 2016-02-08: qty 85

## 2016-02-08 MED ORDER — ATORVASTATIN CALCIUM 20 MG PO TABS
20.0000 mg | ORAL_TABLET | Freq: Every day | ORAL | Status: DC
  Administered 2016-02-08 – 2016-02-10 (×3): 20 mg via ORAL
  Filled 2016-02-08 (×3): qty 1

## 2016-02-08 MED ORDER — PROCHLORPERAZINE MALEATE 10 MG PO TABS
10.0000 mg | ORAL_TABLET | Freq: Four times a day (QID) | ORAL | Status: DC | PRN
  Administered 2016-02-09 – 2016-02-10 (×2): 10 mg via ORAL
  Filled 2016-02-08 (×2): qty 1

## 2016-02-08 MED ORDER — ONDANSETRON HCL 4 MG/2ML IJ SOLN
4.0000 mg | Freq: Four times a day (QID) | INTRAMUSCULAR | Status: DC | PRN
  Administered 2016-02-09 – 2016-02-10 (×3): 4 mg via INTRAVENOUS
  Filled 2016-02-08 (×3): qty 2

## 2016-02-08 MED ORDER — POTASSIUM CHLORIDE IN NACL 20-0.9 MEQ/L-% IV SOLN
INTRAVENOUS | Status: DC
  Administered 2016-02-08 (×2): via INTRAVENOUS
  Filled 2016-02-08 (×6): qty 1000

## 2016-02-08 MED ORDER — ONDANSETRON HCL 4 MG PO TABS
4.0000 mg | ORAL_TABLET | Freq: Four times a day (QID) | ORAL | Status: DC | PRN
  Administered 2016-02-11: 4 mg via ORAL
  Filled 2016-02-08: qty 1

## 2016-02-08 MED ORDER — POTASSIUM CHLORIDE CRYS ER 20 MEQ PO TBCR
40.0000 meq | EXTENDED_RELEASE_TABLET | Freq: Once | ORAL | Status: AC
  Administered 2016-02-08: 40 meq via ORAL
  Filled 2016-02-08: qty 2

## 2016-02-08 NOTE — ED Provider Notes (Signed)
[facility] Emergency Department Provider Note  ____________________________________________  Time seen: Approximately 4:09 AM  I have reviewed the triage vital signs and the nursing notes.   HISTORY  Chief Complaint Diarrhea and Fatigue    HPI Carol Gay is a 70 y.o. female with a history of anal carcinoma who is under the care of Dr. Grayland Ormond and receiving both chemotherapy and radiation therapy.  She presents by EMS for gradual onset of worsening generalized weakness, generalized fatigue, and severe diarrhea with fecal incontinence.  She has had issues with diarrhea off and on for several weeks or months, but it is becoming very severe of the last 2 days.  She states she has no control over it anymore.  She last had chemotherapy about a week ago and then was unable to follow-up for her final treatment because of feeling ill and being in discomfort.  She reports that nothing makes her symptoms better and she is getting worse over time.  She has chronic nausea but has had no vomiting.  She denies abdominal pain.  She has chronic rectal pain.  She denies fever/chills, chest pain, shortness of breath although she does have chronic COPD.   Past Medical History  Diagnosis Date  . Cigarette smoker   . Hypertension   . Arm pain   . Rectal bleeding   . COPD (chronic obstructive pulmonary disease) ([place])   . Hemorrhoids   . CAD in native artery   . Pulmonary emphysema ([place])   . Dyslipidemia   . Dermatophytoses   . DDD (degenerative disc disease), lumbar        . Aortic atherosclerosis ([place])   . H/O blood clots     left leg, veins stripped  . Shortness of breath dyspnea   . Smokers' cough ([place])   . Myocardial infarction [facility])     "mild" - age 16  . Congenital absence of one kidney     born with only right kidney  . Vertigo   . Wears dentures     has full upper and lower - doesn't wear  . Asthma   . Claustrophobia   . Last menstrual period (LMP) >  10 days ago 1994    Patient Active Problem List   Diagnosis Date Noted  . Anal cancer ([place])   . 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 Blood in stool   . Benign neoplasm of transverse colon   . Benign neoplasm of rectosigmoid junction   . Neoplasm of digestive system   . Fistula of intestine, excluding rectum and anus   . Pulmonary emphysema ([place]) 10/24/2015  . Encounter for long-term (current) use of medications 02/28/2015  . Aortic atherosclerosis ([place]) 12/30/2014  . CAD in native artery 12/30/2014  . DDD (degenerative disc disease), lumbar 12/30/2014  . Dyslipidemia 12/30/2014  . Polypharmacy 12/30/2014  . Essential (primary) hypertension 12/30/2014  . Dermatophytoses 12/30/2014  . Compulsive tobacco user syndrome 12/30/2014    Past Surgical History  Procedure Laterality Date  . Vein ligation and stripping    . Ovarian cyst removal Left   . Appendectomy    . Lumbar disc surgery  1997  . Breast biopsy    . Exploratory laparotomy    . Lesion excision  11/03/15    Anal - Dr Dahlia Byes, [facility]  . Colonoscopy with propofol N/A 11/06/2015    Procedure: COLONOSCOPY WITH PROPOFOL;  Surgeon: Lucilla Lame, MD;  Location: [place];  Service: Endoscopy;  Laterality: N/A;  LEAVE PT EARLY  . Polypectomy  11/06/2015    Procedure: POLYPECTOMY;  Surgeon: Lucilla Lame, MD;  Location: [place];  Service: Endoscopy;;  . Portacath placement N/A 12/10/2015    Procedure: INSERTION PORT-A-CATH;  Surgeon: Jules Husbands, MD;  Location: [facility];  Service: General;  Laterality: N/A;    Current Outpatient Rx  Name  Route  Sig  Dispense  Refill  . acetaminophen (TYLENOL) 500 MG tablet   Oral   Take 1,000 mg by mouth every 6 (six) hours as needed.         Marland Kitchen albuterol (PROVENTIL HFA;VENTOLIN HFA) 108 (90 Base) MCG/ACT inhaler   Inhalation   Inhale 2 puffs into the lungs every 6 (six) hours as needed for wheezing or shortness of breath.   3 Inhaler   1   .  ALPRAZolam (XANAX) 0.25 MG tablet   Oral   Take 1 tablet (0.25 mg total) by mouth 2 (two) times daily as needed for anxiety.   60 tablet   0   . amLODipine (NORVASC) 5 MG tablet   Oral   Take 1 tablet (5 mg total) by mouth daily.   90 tablet   1   . atorvastatin (LIPITOR) 20 MG tablet   Oral   Take 1 tablet (20 mg total) by mouth at bedtime.   90 tablet   1   . lidocaine-prilocaine (EMLA) cream   Topical   Apply 1 application topically as needed.   30 g   3   . meclizine (ANTIVERT) 25 MG tablet   Oral   Take 25 mg by mouth 3 (three) times daily as needed for dizziness.         . ondansetron (ZOFRAN) 8 MG tablet   Oral   Take 1 tablet (8 mg total) by mouth every 8 (eight) hours as needed for nausea or vomiting.   20 tablet   0   . phenazopyridine (PYRIDIUM) 200 MG tablet   Oral   Take 1 tablet (200 mg total) by mouth 2 (two) times daily.   30 tablet   1   . prochlorperazine (COMPAZINE) 10 MG tablet   Oral   Take 1 tablet (10 mg total) by mouth every 6 (six) hours as needed for nausea or vomiting.   30 tablet   1   . silver sulfADIAZINE (SILVADENE) 1 % cream   Topical   Apply 1 application topically 2 (two) times daily.   50 g   2   . sucralfate (CARAFATE) 1 g tablet   Oral   Take 1 tablet (1 g total) by mouth 3 (three) times daily. Dissolve in 2-3 tbsp warm water, swish and swallow   90 tablet   3   . traMADol-acetaminophen (ULTRACET) 37.5-325 MG tablet   Oral   Take 1-2 tablets by mouth every 4 (four) hours as needed for moderate pain.   30 tablet   0   . Umeclidinium-Vilanterol (ANORO ELLIPTA) 62.5-25 MCG/INH AEPB   Inhalation   Inhale 1 puff into the lungs daily.   180 each   1     Allergies Aspirin; Codeine; and Morphine and related  Family History  Problem Relation Age of Onset  . Cancer Mother     lymphoma  . Heart disease Mother   . Cancer Maternal Grandmother     breast    Social History Social History  Substance Use  Topics  . Smoking status: Current Every Day  Smoker -- 1.00 packs/day for 57 years    Types: Cigarettes  . Smokeless tobacco: None     Comment: not quite ready  . Alcohol Use: No    Review of Systems Constitutional: No fever/chills Eyes: No visual changes. ENT: No sore throat. Cardiovascular: Denies chest pain. Respiratory: Denies shortness of breath. Gastrointestinal: No abdominal pain.  nausea, no vomiting.  Severe diarrhea Genitourinary: Negative for dysuria. Musculoskeletal: Negative for back pain. Skin: Negative for rash. Neurological: Negative for headaches, focal weakness or numbness.  10-point ROS otherwise negative.  ____________________________________________   PHYSICAL EXAM:  VITAL SIGNS: ED Triage Vitals  Enc Vitals Group     BP 02/08/16 0246 106/52 mmHg     Pulse Rate 02/08/16 0246 90     Resp 02/08/16 0246 20     Temp 02/08/16 0246 98.2 F (36.8 C)     Temp Source 02/08/16 0246 Oral     SpO2 02/08/16 0231 88 %     Weight 02/08/16 0246 163 lb (73.936 kg)     Height 02/08/16 0246 '5\' 2"'$  (1.575 m)     Head Cir --      Peak Flow --      Pain Score 02/08/16 0247 10     Pain Loc --      Pain Edu? --      Excl. in [place]? --     Constitutional: Alert and oriented. Well appearing and in no acute distress. Eyes: Conjunctivae are normal. PERRL. EOMI. Head: Atraumatic. Nose: No congestion/rhinnorhea. Mouth/Throat: Mucous membranes are moist.  Oropharynx non-erythematous. Neck: No stridor.  No meningeal signs.   Cardiovascular: Normal rate, regular rhythm. Good peripheral circulation. Grossly normal heart sounds.   Respiratory: Normal respiratory effort.  No retractions. Lungs CTAB. Gastrointestinal: Soft and nontender. No distention.  Musculoskeletal: No lower extremity tenderness nor edema. No gross deformities of extremities. Neurologic:  Normal speech and language. No gross focal neurologic deficits are appreciated.  Skin:  Skin is warm, dry and intact. No  rash noted. Psychiatric: Mood and affect are normal. Speech and behavior are normal.  ____________________________________________   LABS (all labs ordered are listed, but only abnormal results are displayed)  Labs Reviewed  COMPREHENSIVE METABOLIC PANEL - Abnormal; Notable for the following:    Sodium 133 (*)    Potassium 3.1 (*)    Chloride 97 (*)    Glucose, Bld 103 (*)    Calcium 8.5 (*)    Albumin 2.7 (*)    ALT 10 (*)    All other components within normal limits  CBC - Abnormal; Notable for the following:    WBC 1.1 (*)    RBC 3.44 (*)    Hemoglobin 10.2 (*)    HCT 30.2 (*)    RDW 17.4 (*)    Platelets 32 (*)    All other components within normal limits  URINALYSIS COMPLETEWITH MICROSCOPIC ([facility] ONLY) - Abnormal; Notable for the following:    Color, Urine AMBER (*)    APPearance CLEAR (*)    Ketones, ur TRACE (*)    Specific Gravity, Urine 1.031 (*)    Hgb urine dipstick 3+ (*)    Protein, ur 100 (*)    All other components within normal limits  GASTROINTESTINAL PANEL BY PCR, STOOL (REPLACES STOOL CULTURE)  C DIFFICILE QUICK SCREEN W PCR REFLEX  URINE CULTURE  LIPASE, BLOOD  DIFFERENTIAL   ____________________________________________  EKG  None ____________________________________________  RADIOLOGY   No results found.  ____________________________________________   PROCEDURES  Procedure(s) performed: None  Critical Care performed: No ____________________________________________   INITIAL IMPRESSION / ASSESSMENT AND PLAN / ED COURSE  Pertinent labs & imaging results that were available during my care of the patient were reviewed by me and considered in my medical decision making (see chart for details).  Spoke by phone with Ms. Herring, mid-level practitioner in [facility].  She agreed with my plan to check GI pathogen panel including C. Difficile, provide IV fluids, avoid empiric antibiotics, replete potassium.  Patient became hypoxemic  down to 88% for EMS and is currently on oxygen.  Will admit for generalized weakness, acute on chronic diarrhea, hypoxemia.  GI pathogen panel and C diff are pending. ____________________________________________  FINAL CLINICAL IMPRESSION(S) / ED DIAGNOSES  Final diagnoses:  Acute diarrhea  Leukopenia  Squamous cell carcinoma of anus (HCC)  Immunosuppressed status (HCC)  Hypokalemia, gastrointestinal losses  Generalized weakness     MEDICATIONS GIVEN DURING THIS VISIT:  Medications  sodium chloride 0.9 % bolus 1,000 mL (not administered)  potassium chloride SA (K-DUR,KLOR-CON) CR tablet 40 mEq (not administered)     NEW OUTPATIENT MEDICATIONS STARTED DURING THIS VISIT:  New Prescriptions   No medications on file      Note:  This document was prepared using Dragon voice recognition software and may include unintentional dictation errors.   Hinda Kehr, MD 02/08/16 [phone]

## 2016-02-08 NOTE — ED Notes (Signed)
Patient presents to Emergency Department via EMS with complaints of diarrhea and weakness for the last 2 days, pt reports hx colon cancer and rx'd radiation therapy 30 Jan 2016 (pt's second to last) couldn't attend therapy on 15 MAy d/t soreness and galding in perineal area.    Enteric precautions for diarrhea  Pt was 88% on RA for EMS, hx of COPD and 1 PPD habit, now on 2lt [place].

## 2016-02-08 NOTE — ED Notes (Signed)
Critical lab, WBC: 1.1, Dr Karma Greaser

## 2016-02-08 NOTE — H&P (Signed)
Carol Gay is an 70 y.o. female.   Chief Complaint: Weakness HPI: The patient with past medical history of anal cancer status post chemotherapy 1-1/2 weeks ago presents emergency department complaining of severe weakness as well as acute on chronic diarrhea. The patient has been incontinent of stool and having copious amounts of liquid diarrhea for the last few days. During that time she is also become dramatically weaker to the point that she was unable to stand this evening. She denies vomiting but admits to nausea. She denies any chest pain but admits to shortness of breath. In the emergency department oxygen saturations were found to be less than 90% but improved with oxygen via nasal cannula. Laboratory evaluation revealed leukopenia as well as thrombocytopenia. Following fluid resuscitation as well as repletion of electrolytes emergency department staff called for admission.  Past Medical History  Diagnosis Date  . Cigarette smoker   . Hypertension   . Arm pain   . Rectal bleeding   . COPD (chronic obstructive pulmonary disease) ([place])   . Hemorrhoids   . CAD in native artery   . Pulmonary emphysema ([place])   . Dyslipidemia   . Dermatophytoses   . DDD (degenerative disc disease), lumbar        . Aortic atherosclerosis ([place])   . H/O blood clots     left leg, veins stripped  . Shortness of breath dyspnea   . Smokers' cough ([place])   . Myocardial infarction [facility])     "mild" - age 29  . Congenital absence of one kidney     born with only right kidney  . Vertigo   . Wears dentures     has full upper and lower - doesn't wear  . Asthma   . Claustrophobia   . Last menstrual period (LMP) > 10 days ago 1994    Past Surgical History  Procedure Laterality Date  . Vein ligation and stripping    . Ovarian cyst removal Left   . Appendectomy    . Lumbar disc surgery  1997  . Breast biopsy    . Exploratory laparotomy    . Lesion excision  11/03/15    Anal - Dr Dahlia Byes, [facility]  .  Colonoscopy with propofol N/A 11/06/2015    Procedure: COLONOSCOPY WITH PROPOFOL;  Surgeon: Lucilla Lame, MD;  Location: [place];  Service: Endoscopy;  Laterality: N/A;  LEAVE PT EARLY  . Polypectomy  11/06/2015    Procedure: POLYPECTOMY;  Surgeon: Lucilla Lame, MD;  Location: [place];  Service: Endoscopy;;  . Portacath placement N/A 12/10/2015    Procedure: INSERTION PORT-A-CATH;  Surgeon: Jules Husbands, MD;  Location: [facility];  Service: General;  Laterality: N/A;    Family History  Problem Relation Age of Onset  . Cancer Mother     lymphoma  . Heart disease Mother   . Cancer Maternal Grandmother     breast   Social History:  reports that she has been smoking Cigarettes.  She has a 57 pack-year smoking history. She does not have any smokeless tobacco history on file. She reports that she does not drink alcohol or use illicit drugs.  Allergies:  Allergies  Allergen Reactions  . Aspirin Nausea And Vomiting  . Codeine Nausea And Vomiting  . Morphine And Related Nausea And Vomiting    Prior to Admission medications   Medication Sig Start Date End Date Taking? Authorizing Provider  acetaminophen (TYLENOL) 500 MG tablet Take 1,000 mg by mouth  every 6 (six) hours as needed.   Yes Historical Provider, MD  albuterol (PROVENTIL HFA;VENTOLIN HFA) 108 (90 Base) MCG/ACT inhaler Inhale 2 puffs into the lungs every 6 (six) hours as needed for wheezing or shortness of breath. 09/19/15  Yes Glean Hess, MD  ALPRAZolam Duanne Moron) 0.25 MG tablet Take 1 tablet (0.25 mg total) by mouth 2 (two) times daily as needed for anxiety. 12/23/15  Yes Lloyd Huger, MD  amLODipine (NORVASC) 5 MG tablet Take 1 tablet (5 mg total) by mouth daily. 09/23/15  Yes Glean Hess, MD  atorvastatin (LIPITOR) 20 MG tablet Take 1 tablet (20 mg total) by mouth at bedtime. 09/23/15  Yes Glean Hess, MD  lidocaine-prilocaine (EMLA) cream Apply 1 application topically as needed. 12/10/15  Yes Diego F  Pabon, MD  meclizine (ANTIVERT) 25 MG tablet Take 25 mg by mouth 3 (three) times daily as needed for dizziness.   Yes Historical Provider, MD  ondansetron (ZOFRAN) 8 MG tablet Take 1 tablet (8 mg total) by mouth every 8 (eight) hours as needed for nausea or vomiting. 01/29/16  Yes Lequita Asal, MD  phenazopyridine (PYRIDIUM) 200 MG tablet Take 1 tablet (200 mg total) by mouth 2 (two) times daily. 01/20/16  Yes Noreene Filbert, MD  prochlorperazine (COMPAZINE) 10 MG tablet Take 1 tablet (10 mg total) by mouth every 6 (six) hours as needed for nausea or vomiting. 01/08/16  Yes Lloyd Huger, MD  silver sulfADIAZINE (SILVADENE) 1 % cream Apply 1 application topically 2 (two) times daily. 02/03/16  Yes Noreene Filbert, MD  sucralfate (CARAFATE) 1 g tablet Take 1 tablet (1 g total) by mouth 3 (three) times daily. Dissolve in 2-3 tbsp warm water, swish and swallow 12/23/15  Yes Noreene Filbert, MD  traMADol-acetaminophen (ULTRACET) 37.5-325 MG tablet Take 1-2 tablets by mouth every 4 (four) hours as needed for moderate pain. 12/10/15  Yes Diego F Pabon, MD  Umeclidinium-Vilanterol (ANORO ELLIPTA) 62.5-25 MCG/INH AEPB Inhale 1 puff into the lungs daily. 09/23/15  Yes Glean Hess, MD     Results for orders placed or performed during the hospital encounter of 02/08/16 (from the past 48 hour(s))  Lipase, blood     Status: None   Collection Time: 02/08/16  3:15 AM  Result Value Ref Range   Lipase 16 11 - 51 U/L  Comprehensive metabolic panel     Status: Abnormal   Collection Time: 02/08/16  3:15 AM  Result Value Ref Range   Sodium 133 (L) 135 - 145 mmol/L   Potassium 3.1 (L) 3.5 - 5.1 mmol/L   Chloride 97 (L) 101 - 111 mmol/L   CO2 28 22 - 32 mmol/L   Glucose, Bld 103 (H) 65 - 99 mg/dL   BUN 16 6 - 20 mg/dL   Creatinine, Ser 0.84 0.44 - 1.00 mg/dL   Calcium 8.5 (L) 8.9 - 10.3 mg/dL   Total Protein 6.6 6.5 - 8.1 g/dL   Albumin 2.7 (L) 3.5 - 5.0 g/dL   AST 24 15 - 41 U/L   ALT 10 (L) 14 - 54 U/L    Alkaline Phosphatase 53 38 - 126 U/L   Total Bilirubin 0.7 0.3 - 1.2 mg/dL   GFR calc non Af Amer >60 >60 mL/min   GFR calc Af Amer >60 >60 mL/min    Comment: (NOTE) The eGFR has been calculated using the CKD EPI equation. This calculation has not been validated in all clinical situations. eGFR's persistently <60 mL/min signify  possible Chronic Kidney Disease.    Anion gap 8 5 - 15  CBC     Status: Abnormal   Collection Time: 02/08/16  3:15 AM  Result Value Ref Range   WBC 1.1 (LL) 3.6 - 11.0 K/uL    Comment: RESULT REPEATED AND VERIFIED CRITICAL RESULT CALLED TO, READ BACK BY AND VERIFIED WITH: NOEL WEBSTER AT 0350 02/08/16.PMH    RBC 3.44 (L) 3.80 - 5.20 MIL/uL   Hemoglobin 10.2 (L) 12.0 - 16.0 g/dL   HCT 30.2 (L) 35.0 - 47.0 %   MCV 87.9 80.0 - 100.0 fL   MCH 29.7 26.0 - 34.0 pg   MCHC 33.8 32.0 - 36.0 g/dL   RDW 17.4 (H) 11.5 - 14.5 %   Platelets 32 (L) 150 - 440 K/uL  Differential     Status: Abnormal   Collection Time: 02/08/16  3:15 AM  Result Value Ref Range   Neutrophils Relative % 35 %   Neutro Abs 0.4 (L) 1.4 - 6.5 K/uL   Lymphocytes Relative 12 %   Lymphs Abs 0.1 (L) 1.0 - 3.6 K/uL   Monocytes Relative 52 %   Monocytes Absolute 0.5 0.2 - 0.9 K/uL   Eosinophils Relative 1 %   Eosinophils Absolute 0.0 0 - 0.7 K/uL   Basophils Relative 0 %   Basophils Absolute 0.0 0 - 0.1 K/uL   WBC Morphology TOO FEW TO COUNT, SMEAR AVAILABLE FOR REVIEW   Urinalysis complete, with microscopic     Status: Abnormal   Collection Time: 02/08/16  5:55 AM  Result Value Ref Range   Color, Urine AMBER (A) YELLOW   APPearance CLEAR (A) CLEAR   Glucose, UA NEGATIVE NEGATIVE mg/dL   Bilirubin Urine NEGATIVE NEGATIVE   Ketones, ur TRACE (A) NEGATIVE mg/dL   Specific Gravity, Urine 1.031 (H) 1.005 - 1.030   Hgb urine dipstick 3+ (A) NEGATIVE   pH 5.0 5.0 - 8.0   Protein, ur 100 (A) NEGATIVE mg/dL   Nitrite NEGATIVE NEGATIVE   Leukocytes, UA NEGATIVE NEGATIVE   RBC / HPF 0-5 0 - 5  RBC/hpf   WBC, UA 0-5 0 - 5 WBC/hpf   Bacteria, UA NONE SEEN NONE SEEN   Squamous Epithelial / LPF NONE SEEN NONE SEEN   Mucous PRESENT    Hyaline Casts, UA PRESENT    Dg Chest Portable 1 View  02/08/2016  CLINICAL DATA:  Diarrhea, weakness. History of colon cancer status post radiation therapy. EXAM: PORTABLE CHEST 1 VIEW COMPARISON:  12/10/2015 FINDINGS: Mild patchy opacity at the lateral right lung base, suspicious for pneumonia. Atelectasis/ scarring is also possible given the associated volume loss, but this appearance is new when compared to 12/10/2015. No pleural effusion or pneumothorax. Mild rightward cardiomediastinal shift. The heart is normal in size. Right chest power port terminates in the lower SVC. IMPRESSION: Mild patchy opacity at the lateral right lung base, suspicious for pneumonia, less likely atelectasis/scarring. This appearance is new when compared to 12/10/2015. Electronically Signed   By: Julian Hy M.D.   On: 02/08/2016 07:18    Review of Systems  Constitutional: Negative for fever and chills.  HENT: Negative for sore throat and tinnitus.   Eyes: Negative for blurred vision and redness.  Respiratory: Negative for cough and shortness of breath.   Cardiovascular: Negative for chest pain, palpitations, orthopnea and PND.  Gastrointestinal: Positive for nausea and diarrhea. Negative for vomiting and abdominal pain.  Genitourinary: Negative for dysuria, urgency and frequency.  Musculoskeletal: Negative for  myalgias and joint pain.  Skin: Negative for rash.       No lesions  Neurological: Positive for weakness. Negative for speech change and focal weakness.  Endo/Heme/Allergies: Does not bruise/bleed easily.       No temperature intolerance  Psychiatric/Behavioral: Negative for depression and suicidal ideas.    Blood pressure 106/52, pulse 90, temperature 98.2 F (36.8 C), temperature source Oral, resp. rate 20, height _0  (1.575 m), weight 73.936 kg (163  lb), SpO2 94 %. Physical Exam  Vitals reviewed. Constitutional: She is oriented to person, place, and time. She appears well-developed and well-nourished. No distress.  HENT:  Head: Normocephalic and atraumatic.  Mouth/Throat: Oropharynx is clear and moist.  Eyes: Conjunctivae and EOM are normal. Pupils are equal, round, and reactive to light. No scleral icterus.  Neck: Normal range of motion. Neck supple. No JVD present. No tracheal deviation present. No thyromegaly present.  Cardiovascular: Normal rate, regular rhythm and normal heart sounds.  Exam reveals no gallop and no friction rub.   No murmur heard. Respiratory: Effort normal and breath sounds normal.  GI: Soft. Bowel sounds are normal. She exhibits no distension. There is no tenderness.  Genitourinary:  Deferred  Musculoskeletal: Normal range of motion. She exhibits no edema.  Lymphadenopathy:    She has no cervical adenopathy.  Neurological: She is alert and oriented to person, place, and time. No cranial nerve deficit. She exhibits normal muscle tone.  Skin: Skin is warm and dry. No rash noted. There is erythema (Groin).  Psychiatric: She has a normal mood and affect. Her behavior is normal. Judgment and thought content normal.     Assessment/Plan This is a 70 year old female admitted for weakness as well as acute on chronic diarrhea. 1. Weakness: Secondary to chemotherapy as well as poor by mouth intake and chronic diarrhea. We will fluid resuscitate intravenous saline and continue to replete her potassium which contributes to her weakness. Physical therapy and occupational therapy evaluation prior to discharge. 2. Diarrhea: Acute on chronic. Chemotherapy and radiation to the pelvis contributes to her fecal incontinence. Enteric organism PCR pending. The patient is on contact precautions. 3. COPD: The patient is mildly hypoxic. We will give supplemental oxygen as needed to maintain oxygen saturations 88-92%. She does not have  significant sputum production but admits to more frequent cough lately. I have given her 60 mg of Solu-Medrol which will hopefully help with her cough as well as skin irritation and breakdown secondary to radiation. Steroid taper versus consistent dose for radiation burns at the discretion of the primary team. Continue inhalers per home regimen. 4. Coronary artery disease: Stable. 5. Hypertension: Continue amlodipine 6. DVT prophylaxis: SCDs 7. GI prophylaxis: Sucralfate per home regimen The patient is a DO NOT RESUSCITATE. Time spent on admission was inpatient care approximately 45 minutes.  Harrie Foreman, MD 02/08/2016, 7:26 AM

## 2016-02-08 NOTE — ED Notes (Signed)
Pt cleaned and linen changed applied silver sulfa

## 2016-02-08 NOTE — Progress Notes (Signed)
[place] at [place] NAME: Etosha Wetherell    MR#:  [id number]  DATE OF BIRTH:  11-07-1945  SUBJECTIVE:   Patient here due to ongoing diarrhea and dehydration.  Diarrhea has improved this a.m.  No abdominal pain, N/V.  No fever.   REVIEW OF SYSTEMS:    Review of Systems  Constitutional: Negative for fever and chills.  HENT: Negative for congestion and tinnitus.   Eyes: Negative for blurred vision and double vision.  Respiratory: Negative for cough, shortness of breath and wheezing.   Cardiovascular: Negative for chest pain, orthopnea and PND.  Gastrointestinal: Positive for diarrhea. Negative for nausea, vomiting and abdominal pain.  Genitourinary: Negative for dysuria and hematuria.  Neurological: Positive for weakness. Negative for dizziness, sensory change and focal weakness.  All other systems reviewed and are negative.   Nutrition: Regular Tolerating Diet: Yes Tolerating PT: Await evaluation      DRUG ALLERGIES:   Allergies  Allergen Reactions  . Aspirin Nausea And Vomiting  . Codeine Nausea And Vomiting  . Morphine And Related Nausea And Vomiting    VITALS:  Blood pressure 104/44, pulse 80, temperature 98.1 F (36.7 C), temperature source Oral, resp. rate 22, height '5\' 2"'$  (1.575 m), weight 73.936 kg (163 lb), SpO2 94 %.  PHYSICAL EXAMINATION:   Physical Exam  GENERAL:  70 y.o.-year-old patient lying in the bed with no acute distress.  EYES: Pupils equal, round, reactive to light and accommodation. No scleral icterus. Extraocular muscles intact.  HEENT: Head atraumatic, normocephalic. Oropharynx and nasopharynx clear.  NECK:  Supple, no jugular venous distention. No thyroid enlargement, no tenderness.  LUNGS: Normal breath sounds bilaterally, no wheezing, rales, rhonchi. No use of accessory muscles of respiration.  CARDIOVASCULAR: S1, S2 normal. No murmurs, rubs, or gallops.  ABDOMEN: Soft, nontender, nondistended.  Bowel sounds present. No organomegaly or mass.  EXTREMITIES: No cyanosis, clubbing or edema b/l.    NEUROLOGIC: Cranial nerves II through XII are intact. No focal Motor or sensory deficits b/l.   PSYCHIATRIC: The patient is alert and oriented x 3.  SKIN: No obvious rash, lesion, or ulcer.    LABORATORY PANEL:   CBC  Recent Labs Lab 02/08/16 0315  WBC 1.1*  HGB 10.2*  HCT 30.2*  PLT 32*   ------------------------------------------------------------------------------------------------------------------  Chemistries   Recent Labs Lab 02/08/16 0315  NA 133*  K 3.1*  CL 97*  CO2 28  GLUCOSE 103*  BUN 16  CREATININE 0.84  CALCIUM 8.5*  AST 24  ALT 10*  ALKPHOS 53  BILITOT 0.7   ------------------------------------------------------------------------------------------------------------------  Cardiac Enzymes No results for input(s): TROPONINI in the last 168 hours. ------------------------------------------------------------------------------------------------------------------  RADIOLOGY:  Dg Chest Portable 1 View  02/08/2016  CLINICAL DATA:  Diarrhea, weakness. History of colon cancer status post radiation therapy. EXAM: PORTABLE CHEST 1 VIEW COMPARISON:  12/10/2015 FINDINGS: Mild patchy opacity at the lateral right lung base, suspicious for pneumonia. Atelectasis/ scarring is also possible given the associated volume loss, but this appearance is new when compared to 12/10/2015. No pleural effusion or pneumothorax. Mild rightward cardiomediastinal shift. The heart is normal in size. Right chest power port terminates in the lower SVC. IMPRESSION: Mild patchy opacity at the lateral right lung base, suspicious for pneumonia, less likely atelectasis/scarring. This appearance is new when compared to 12/10/2015. Electronically Signed   By: Julian Hy M.D.   On: 02/08/2016 07:18     ASSESSMENT AND PLAN:   70 year old female with past medical  history of colon cancer  currently undergoing chemoradiation, essential hypertension, COPD, degenerative disc disease, hyperlipidemia, anxiety who presented to the hospital due to nausea, diarrhea and generalized weakness.  1. Acute diarrhea-this is acute on chronic in nature. This is likely secondary to underlying chemotherapy and also underlying infectious diarrhea. Patient's gastrointestinal PCR is positive for anterolateral pathogenic Escherichia coli. -Continue supportive care with IV fluids, antiemetics, I will start the patient on ciprofloxacin for the E. Coli. -Follow clinically.  2. Generalized weakness-multifactorial and related to underlying diarrhea, chemotherapy, underlying malignancy. -Continue IV fluids, antibiotics with diarrhea. We'll get physical therapy consult.  3. Pancytopenia-secondary to underlying malignancy and also chemotherapy. -We'll follow hemoglobin and platelet count. No evidence of acute bleeding.  4. Dehydration-secondary to the ongoing diarrhea. Continue IV fluids.  5. Hyperlipidemia/hypokalemia-secondary to underlying dehydration and diarrhea. -Continue to hydrate with IV fluids, replace potassium follow electrolytes.  5. History of COPD-no acute exacerbation presently. -Continue albuterol nebulizers as needed.  6. Essential hypertension-continue Norvasc.  7. Anxiety-continue Xanax as needed.   All the records are reviewed and case discussed with Care Management/Social Workerr. Management plans discussed with the patient, family and they are in agreement.  CODE STATUS: DNR  DVT Prophylaxis: Ted's & SCD's  TOTAL TIME TAKING CARE OF THIS PATIENT: 30 minutes.   POSSIBLE D/C IN 2-3 DAYS, DEPENDING ON CLINICAL CONDITION.   [name] M.D on 02/08/2016 at 12:22 PM  Between 7am to 6pm - Pager - [phone]  After 6pm go to [email or web address] - password [email or web address] [place] [facility]  Office  [phone]  CC: Primary care physician; Halina Maidens, MD

## 2016-02-09 ENCOUNTER — Ambulatory Visit
Admission: RE | Admit: 2016-02-09 | Discharge: 2016-02-09 | Disposition: A | Discharge status: Home / Self Care | Payer: Medicare PPO | Source: Ambulatory Visit | Attending: Radiation Oncology | Admitting: Radiation Oncology

## 2016-02-09 LAB — BASIC METABOLIC PANEL
Anion gap: 5 (ref 5–15)
BUN: 18 mg/dL (ref 6–20)
CO2: 23 mmol/L (ref 22–32)
Calcium: 8.3 mg/dL — ABNORMAL LOW (ref 8.9–10.3)
Chloride: 106 mmol/L (ref 101–111)
Creatinine, Ser: 0.59 mg/dL (ref 0.44–1.00)
GFR calc Af Amer: >60 mL/min (ref >60)
GLUCOSE: 117 mg/dL — AB (ref 65–99)
POTASSIUM: 4.2 mmol/L (ref 3.5–5.1)
Sodium: 134 mmol/L — ABNORMAL LOW (ref 135–145)

## 2016-02-09 LAB — URINE CULTURE
Culture: NO GROWTH
Special Requests: NORMAL

## 2016-02-09 LAB — CBC
HEMATOCRIT: 26.9 % — AB (ref 35.0–47.0)
HEMOGLOBIN: 9.2 g/dL — AB (ref 12.0–16.0)
MCH: 30.4 pg (ref 26.0–34.0)
MCHC: 34.1 g/dL (ref 32.0–36.0)
MCV: 89.2 fL (ref 80.0–100.0)
PLATELETS: 27 10*3/uL — AB (ref 150–440)
RBC: 3.02 MIL/uL — ABNORMAL LOW (ref 3.80–5.20)
RDW: 17.5 % — ABNORMAL HIGH (ref 11.5–14.5)
WBC: 1.9 10*3/uL — ABNORMAL LOW (ref 3.6–11.0)

## 2016-02-09 LAB — GLUCOSE, CAPILLARY
Glucose-Capillary: 103 mg/dL — ABNORMAL HIGH (ref 65–99)
Glucose-Capillary: 106 mg/dL — ABNORMAL HIGH (ref 65–99)
Glucose-Capillary: 110 mg/dL — ABNORMAL HIGH (ref 65–99)

## 2016-02-09 MED ORDER — ENSURE ENLIVE PO LIQD
237.0000 mL | Freq: Two times a day (BID) | ORAL | Status: DC
  Administered 2016-02-10 – 2016-02-11 (×2): 237 mL via ORAL

## 2016-02-09 MED ORDER — ACETAMINOPHEN 500 MG PO TABS
1000.0000 mg | ORAL_TABLET | Freq: Four times a day (QID) | ORAL | Status: DC | PRN
  Administered 2016-02-09 – 2016-02-11 (×4): 1000 mg via ORAL
  Filled 2016-02-09 (×4): qty 2

## 2016-02-09 MED ORDER — ALUM & MAG HYDROXIDE-SIMETH 200-200-20 MG/5ML PO SUSP
30.0000 mL | Freq: Four times a day (QID) | ORAL | Status: DC | PRN
  Administered 2016-02-09: 30 mL via ORAL
  Filled 2016-02-09: qty 30

## 2016-02-09 MED ORDER — CIPROFLOXACIN HCL 500 MG PO TABS
500.0000 mg | ORAL_TABLET | Freq: Two times a day (BID) | ORAL | Status: AC
  Administered 2016-02-09 – 2016-02-10 (×3): 500 mg via ORAL
  Filled 2016-02-09 (×3): qty 1

## 2016-02-09 NOTE — Progress Notes (Signed)
Made Dr. Verdell Carmine aware that per pt she does not have DM and would like blood sugar checks and insulin discontinued.  Per Dr. Verdell Carmine ok to d/c.  Pt placed on protective isolation due to WBC of 1.9.  Clarise Cruz, RN

## 2016-02-09 NOTE — Progress Notes (Signed)
Dr. Verdell Carmine made aware of pt's platelet of 27.  No new orders at this time.  Clarise Cruz, RN

## 2016-02-09 NOTE — Progress Notes (Signed)
Initial Nutrition Assessment  DOCUMENTATION CODES:   Not applicable  INTERVENTION:  -Monitor intake and cater to pt preferences. Reviewed menu with pt and explained ordering process -Recommend Ensure Enlive po BID, each supplement provides 350 kcal and 20 grams of protein    NUTRITION DIAGNOSIS:   Inadequate oral intake related to altered GI function as evidenced by per patient/family report.    GOAL:   Patient will meet greater than or equal to 90% of their needs    MONITOR:   PO intake, Supplement acceptance  REASON FOR ASSESSMENT:   Malnutrition Screening Tool    ASSESSMENT:    Pt admitted with diarrhea, dehydration, weakness.  Pt currently undergoing chemo and radation for colon cancer. Pt positive for e coli in stool.      Past Medical History  Diagnosis Date  . Cigarette smoker   . Hypertension   . Arm pain   . Rectal bleeding   . COPD (chronic obstructive pulmonary disease) ([place])   . Hemorrhoids   . CAD in native artery   . Pulmonary emphysema ([place])   . Dyslipidemia   . Dermatophytoses   . DDD (degenerative disc disease), lumbar        . Aortic atherosclerosis ([place])   . H/O blood clots     left leg, veins stripped  . Shortness of breath dyspnea   . Smokers' cough ([place])   . Myocardial infarction [facility])     "mild" - age 5  . Congenital absence of one kidney     born with only right kidney  . Vertigo   . Wears dentures     has full upper and lower - doesn't wear  . Asthma   . Claustrophobia   . Last menstrual period (LMP) > 10 days ago 1994   Pt reports for the past 3 days prior to admission poor po intake, just eating bites secondary to diarrhea and did not want to eat.  Reports for lunch today ate most of the meat, 100% of green beans 1/2 roll and most of chocolate cake.   Medications reviewed: aspart, carafate Labs reviewed: Na 134, calcium 8.3, glucose 117  Nutrition-Focused physical exam completed. Findings are no fat depletion, no  muscle depletion, and mild edema.     Diet Order:  Diet regular Room service appropriate?: Yes; Fluid consistency:: Thin  Skin:  Reviewed, no issues  Last BM:  5/21  Height:   Ht Readings from Last 1 Encounters:  02/08/16 '5\' 2"'$  (1.575 m)    Weight: Pt reports wt in Jan 2017 was 176 pounds prior to starting chemo and radiation. Current wt of 168 pounds (4% wt loss in the last 5 months  Wt Readings from Last 1 Encounters:  02/09/16 168 lb 4.8 oz (76.34 kg)    Ideal Body Weight:     BMI:  Body mass index is 30.77 kg/(m^2).  Estimated Nutritional Needs:   Kcal:  5188-4166 kcals/d  Protein:  91-114 g/d  Fluid:  1.9-2.0 L/d  EDUCATION NEEDS:   No education needs identified at this time  Michalina Calbert B. Zenia Resides, [place], [place] (pager) Weekend/On-Call pager [phone])

## 2016-02-09 NOTE — Progress Notes (Signed)
Per Hubert Azure, due to pt testing positive for enteropathogenic e.coli, if pt becomes incontinent of stool during this stay she must be placed on isolation. No isolation needed as long as she is continent.  Clarise Cruz, RN

## 2016-02-09 NOTE — Evaluation (Signed)
Physical Therapy Evaluation Patient Details Name: Carol Gay MRN: [id number] DOB: [birth year] Today's Date: 02/09/2016   History of Present Illness  Pt is a 70 y.o. female presenting to hospital with diarrhea and fatigue.  PMH includes anal carcinoma (active chemo and radiation therapy), port-a-cath 12/10/15 placement, COPD, htn, h/o blood clots, MI, vertigo, claustrophobia.  Clinical Impression  Prior to admission, pt was independent ambulating without AD (not on home O2).  Pt lives alone but her daughter is currently staying with her in 1 level home with stairs to enter.  Currently pt is modified independent supine to sit and CGA with transfers.  Deferred ambulation d/t pt's O2 88-89% on 2 L/min O2 via nasal cannula post transfers and unable to get above 89% with vc's for breathing technique on supplemental O2 (nursing notified immediately) .  Pt would benefit from skilled PT to address noted impairments and functional limitations.  Need to further assess ambulation but based on pt's functional mobility that was assessed, anticipate pt will be able to discharge to home with 24/7 assist and HHPT when medically appropriate.     Follow Up Recommendations Home health PT;Supervision/Assistance - 24 hour    Equipment Recommendations   (TBD)    Recommendations for Other Services       Precautions / Restrictions Precautions Precautions: Fall Restrictions Weight Bearing Restrictions: No      Mobility  Bed Mobility Overal bed mobility: Modified Independent             General bed mobility comments: Supine to sit  Transfers Overall transfer level: Needs assistance Equipment used: None Transfers: Sit to/from Stand;Stand Pivot Transfers Sit to Stand: Min guard Stand pivot transfers: Min guard (bed to commode to recliner chair)       General transfer comment: steady without loss of balance; good strong stand  Ambulation/Gait             General Gait Details: deferred  d/t pt's O2 88-89% on 2 L/min O2 via nasal cannula post ambulation and unable to get above 89% with vc's for breathing technique (nursing notified immediately)  Stairs            Wheelchair Mobility    Modified Rankin (Stroke Patients Only)       Balance Overall balance assessment: Needs assistance Sitting-balance support: No upper extremity supported;Feet supported Sitting balance-Leahy Scale: Normal     Standing balance support: Single extremity supported Standing balance-Leahy Scale: Good                               Pertinent Vitals/Pain Pain Assessment: 0-10 Pain Score: 6  Pain Location: pt's "bottom" from diarrhea Pain Descriptors / Indicators: Sore;Tender Pain Intervention(s): Limited activity within patient's tolerance;Monitored during session;Repositioned    Home Living Family/patient expects to be discharged to:: Private residence Living Arrangements: Children Available Help at Discharge: Family Type of Home: House Home Access: Stairs to enter Entrance Stairs-Rails: None Entrance Stairs-Number of Steps: 3 Home Layout: One level Home Equipment: Cane - single point Additional Comments: Pt's daughter staying with pt currently.    Prior Function Level of Independence: Independent         Comments: Pt denies any falls in past 6 months.     Hand Dominance        Extremity/Trunk Assessment   Upper Extremity Assessment: Generalized weakness           Lower Extremity Assessment: Generalized weakness  Communication   Communication: No difficulties  Cognition Arousal/Alertness: Awake/alert Behavior During Therapy: WFL for tasks assessed/performed Overall Cognitive Status: Within Functional Limits for tasks assessed                      General Comments   Nursing cleared pt for participation in physical therapy.  Pt agreeable to PT session but requesting to toilet d/t feeling like she had to have bowel  movement.    Exercises        Assessment/Plan    PT Assessment Patient needs continued PT services  PT Diagnosis Difficulty walking;Generalized weakness   PT Problem List Decreased strength;Decreased activity tolerance;Decreased balance;Decreased mobility  PT Treatment Interventions DME instruction;Gait training;Stair training;Functional mobility training;Therapeutic activities;Therapeutic exercise;Balance training;Patient/family education   PT Goals (Current goals can be found in the Care Plan section) Acute Rehab PT Goals Patient Stated Goal: to go home PT Goal Formulation: With patient Time For Goal Achievement: 02/23/16 Potential to Achieve Goals: Good    Frequency Min 2X/week   Barriers to discharge        Co-evaluation               End of Session Equipment Utilized During Treatment: Gait belt;Oxygen (2 L/min O2 via nasal cannula) Activity Tolerance: Patient limited by fatigue Patient left: in chair;with call bell/phone within reach;with chair alarm set Nurse Communication: Mobility status;Precautions (89% O2 saturations on 2 L/min O2 via nasal cannula (nursing reporting they would address O2))         Time: 1120-1146 PT Time Calculation (min) (ACUTE ONLY): 26 min   Charges:   PT Evaluation $PT Eval Moderate Complexity: 1 Procedure PT Treatments $Therapeutic Activity: 8-22 mins   PT G CodesLeitha Bleak Feb 11, 2016, 2:22 PM Leitha Bleak, [place]

## 2016-02-09 NOTE — Progress Notes (Signed)
[place] at [place] NAME: Carol Gay    MR#:  [id number]  DATE OF BIRTH:  November 27, 1945  SUBJECTIVE:   Diarrhea much improved today.  No N/V.  Tolerating PO well presently.  Electrolytes improved.   REVIEW OF SYSTEMS:    Review of Systems  Constitutional: Negative for fever and chills.  HENT: Negative for congestion and tinnitus.   Eyes: Negative for blurred vision and double vision.  Respiratory: Negative for cough, shortness of breath and wheezing.   Cardiovascular: Negative for chest pain, orthopnea and PND.  Gastrointestinal: Positive for diarrhea. Negative for nausea, vomiting and abdominal pain.  Genitourinary: Negative for dysuria and hematuria.  Neurological: Positive for weakness. Negative for dizziness, sensory change and focal weakness.  All other systems reviewed and are negative.   Nutrition: Regular Tolerating Diet: Yes Tolerating PT: Await evaluation    DRUG ALLERGIES:   Allergies  Allergen Reactions  . Aspirin Nausea And Vomiting  . Codeine Nausea And Vomiting  . Morphine And Related Nausea And Vomiting    VITALS:  Blood pressure 119/55, pulse 79, temperature 97.7 F (36.5 C), temperature source Oral, resp. rate 20, height '5\' 2"'$  (1.575 m), weight 76.34 kg (168 lb 4.8 oz), SpO2 91 %.  PHYSICAL EXAMINATION:   Physical Exam  GENERAL:  70 y.o.-year-old patient sitting up in chair in no acute distress.  EYES: Pupils equal, round, reactive to light and accommodation. No scleral icterus. Extraocular muscles intact.  HEENT: Head atraumatic, normocephalic. Oropharynx and nasopharynx clear.  NECK:  Supple, no jugular venous distention. No thyroid enlargement, no tenderness.  LUNGS: Normal breath sounds bilaterally, no wheezing, rales, rhonchi. No use of accessory muscles of respiration.  CARDIOVASCULAR: S1, S2 normal. No murmurs, rubs, or gallops.  ABDOMEN: Soft, nontender, nondistended. Bowel sounds present. No  organomegaly or mass.  EXTREMITIES: No cyanosis, clubbing or edema b/l.    NEUROLOGIC: Cranial nerves II through XII are intact. No focal Motor or sensory deficits b/l.   PSYCHIATRIC: The patient is alert and oriented x 3.  SKIN: No obvious rash, lesion, or ulcer.    LABORATORY PANEL:   CBC  Recent Labs Lab 02/09/16 0429  WBC 1.9*  HGB 9.2*  HCT 26.9*  PLT 27*   ------------------------------------------------------------------------------------------------------------------  Chemistries   Recent Labs Lab 02/08/16 0315 02/09/16 0429  NA 133* 134*  K 3.1* 4.2  CL 97* 106  CO2 28 23  GLUCOSE 103* 117*  BUN 16 18  CREATININE 0.84 0.59  CALCIUM 8.5* 8.3*  AST 24  --   ALT 10*  --   ALKPHOS 53  --   BILITOT 0.7  --    ------------------------------------------------------------------------------------------------------------------  Cardiac Enzymes No results for input(s): TROPONINI in the last 168 hours. ------------------------------------------------------------------------------------------------------------------  RADIOLOGY:  Dg Chest Portable 1 View  02/08/2016  CLINICAL DATA:  Diarrhea, weakness. History of colon cancer status post radiation therapy. EXAM: PORTABLE CHEST 1 VIEW COMPARISON:  12/10/2015 FINDINGS: Mild patchy opacity at the lateral right lung base, suspicious for pneumonia. Atelectasis/ scarring is also possible given the associated volume loss, but this appearance is new when compared to 12/10/2015. No pleural effusion or pneumothorax. Mild rightward cardiomediastinal shift. The heart is normal in size. Right chest power port terminates in the lower SVC. IMPRESSION: Mild patchy opacity at the lateral right lung base, suspicious for pneumonia, less likely atelectasis/scarring. This appearance is new when compared to 12/10/2015. Electronically Signed   By: Julian Hy M.D.   On: 02/08/2016  07:18     ASSESSMENT AND PLAN:   70 year old female  with past medical history of colon cancer currently undergoing chemoradiation, essential hypertension, COPD, degenerative disc disease, hyperlipidemia, anxiety who presented to the hospital due to nausea, diarrhea and generalized weakness.  1. Acute diarrhea-this is acute on chronic in nature. This is likely secondary to underlying chemotherapy and also underlying infectious diarrhea. Patient's gastrointestinal PCR is positive for entero-pathogenic Escherichia coli. - cont. Cipro, d/c IV fluids and cont. Supportive care for now.  Improving.   2. Generalized weakness-multifactorial and related to underlying diarrhea, chemotherapy, underlying malignancy. -Continue IV fluids, antibiotics with diarrhea.  - await PT eval.    3. Pancytopenia-secondary to underlying malignancy and also chemotherapy. - counts stable and will monitor.   4. Dehydration-secondary to the ongoing diarrhea.  - improving w/ IV fluids.   5. Hypomagnesemia/hypokalemia-secondary to underlying dehydration and diarrhea. - improved w/ supplementation and will monitor.   6. History of COPD-no acute exacerbation presently. -Continue albuterol nebulizers as needed.  7. Essential hypertension-continue Norvasc.  8. Anxiety-continue Xanax as needed.  Await PT eval and likely d/c tomorrow.    All the records are reviewed and case discussed with Care Management/Social Workerr. Management plans discussed with the patient, family and they are in agreement.  CODE STATUS: DNR  DVT Prophylaxis: Ted's & SCD's  TOTAL TIME TAKING CARE OF THIS PATIENT: 30 minutes.   POSSIBLE D/C IN 1-2 DAYS, DEPENDING ON CLINICAL CONDITION.   [name] M.D on 02/09/2016 at 1:54 PM  Between 7am to 6pm - Pager - [phone]  After 6pm go to [email or web address] - password [email or web address] [place] [facility]  Office  [phone]  CC: Primary care physician; Halina Maidens, MD

## 2016-02-10 ENCOUNTER — Ambulatory Visit: Admit: 2016-02-10 | Discharge: 2016-02-10 | Disposition: A | Discharge status: Home / Self Care | Payer: Medicare PPO

## 2016-02-10 ENCOUNTER — Ambulatory Visit: Payer: Medicare PPO

## 2016-02-10 LAB — CBC
HEMATOCRIT: 26.3 % — AB (ref 35.0–47.0)
HEMOGLOBIN: 8.9 g/dL — AB (ref 12.0–16.0)
MCH: 31.4 pg (ref 26.0–34.0)
MCHC: 33.9 g/dL (ref 32.0–36.0)
MCV: 92.5 fL (ref 80.0–100.0)
Platelets: 29 10*3/uL — CL (ref 150–440)
RBC: 2.84 MIL/uL — ABNORMAL LOW (ref 3.80–5.20)
RDW: 17.6 % — AB (ref 11.5–14.5)
WBC: 2.2 10*3/uL — AB (ref 3.6–11.0)

## 2016-02-10 MED ORDER — NICOTINE 21 MG/24HR TD PT24
21.0000 mg | MEDICATED_PATCH | Freq: Every day | TRANSDERMAL | Status: DC
  Administered 2016-02-10 – 2016-02-11 (×2): 21 mg via TRANSDERMAL
  Filled 2016-02-10 (×2): qty 1

## 2016-02-10 MED ORDER — LOPERAMIDE HCL 2 MG PO CAPS
2.0000 mg | ORAL_CAPSULE | Freq: Four times a day (QID) | ORAL | Status: DC | PRN
  Administered 2016-02-10 – 2016-02-11 (×4): 2 mg via ORAL
  Filled 2016-02-10 (×4): qty 1

## 2016-02-10 NOTE — Progress Notes (Signed)
SATURATION QUALIFICATIONS: (This note is used to comply with regulatory documentation for home oxygen)  Patient Saturations on Room Air at Rest = 92%  Patient Saturations on Room Air while Ambulating = 86%  Patient Saturations on 2 Liters of oxygen while Ambulating = 92%

## 2016-02-10 NOTE — Clinical Documentation Improvement (Signed)
Internal Medicine at [facility]  Please document query responses in the progress notes and discharge summary, not on the CDI BPA from in [facility]. Please do not deactivate queries without responding. Thank you!  Possible Clinical Conditions:  - Chronic Hypoxic Respiratory Failure  - Other condition  - Unable to clinically determine  Clinical Information: Patient has a known history of COPD without exacerbation this admission Patient continues to smoke prior to admission Saturation Qualifications completed 02/10/16 Patient Saturations on Room Air at Rest = 92%  Patient Saturations on Room Air while Ambulating = 86%  Patient Saturations on 2 Liters of oxygen while Ambulating = 92%     Please exercise your independent, professional judgment when responding. A specific answer is not anticipated or expected.   Thank You, Erling Conte  RN BSN CCDS [phone] Health Information Management [place]

## 2016-02-10 NOTE — Care Management (Signed)
Admitted to [facility] with the diagnosis of weakness. Daughter, Lynelle Smoke, is now living with her. Daughter-in-law is Mickie Kay [phone]). Last seen Dr.Berglund January 2017. Takes care of all basic and instrumental activities of daily living, drives. No home health. No skilled facility. No home oxygen. Uses a cane for ambulation a needed. Last chemotherapy was 2 weeks ago. Last radiation was Monday of last week. No falls. Decreased appetite.  Physical therapy evaluation completed. Recommends home with home health and therapy. Discussed home health agencies. Chose [facility]. Flo Shanks, RN representative for [facility] updated. Shelbie Ammons RN MSN CCM Care Management [phone]

## 2016-02-10 NOTE — Progress Notes (Signed)
[place] referral received from [facility]. Carol Gay is a 70 year old woman with anal cancer, s/p chemo and radiation. She currently lives in her own home with her daughter Carol Gay there to assist in her care. She was admitted to [facility] on 5/21 for weakness related to diarrhea following her radiation treatment. She has received IV fluids for hydration but remains with a poor appetite. Patient seen sitting up in bed, alert and oriented x 4, very talkative. Reports pain to her "bottom" , she has skin irritation/radiation burns to her groin and perianal area, silvadene is being used currently. She also reports pain with bowel movements. Pain is being managed with PRN tramadol/acetaminophen. She is requiring oxygen at 2 liters, she does not have oxygen in place at home. Plan per [facility] Hassan Rowan, is to assess for oxygen need prior to discharge. Carol Gay may also require a BSC, Probation officer discussed with Hassan Rowan. Plan is for possible discharge tomorrow per patient.  Information faxed to referral. Will continue to follow through final disposition. Thank you for the opportunity to be involved in the care of this patient. Flo Shanks RN, BSN, Day Hospital Liaison [phone] c

## 2016-02-10 NOTE — Progress Notes (Signed)
[facility] [place] NAME: Carol Gay    MR#:  [id number]  DATE OF BIRTH:  [birth year]  SUBJECTIVE:   Patient having diarrhea overnight.  No N/V.  Tolerating PO well presently. Still feels very weak.  REVIEW OF SYSTEMS:    Review of Systems  Constitutional: Negative for fever and chills.  HENT: Negative for congestion and tinnitus.   Eyes: Negative for blurred vision and double vision.  Respiratory: Negative for cough, shortness of breath and wheezing.   Cardiovascular: Negative for chest pain, orthopnea and PND.  Gastrointestinal: Positive for diarrhea. Negative for nausea, vomiting and abdominal pain.  Genitourinary: Negative for dysuria and hematuria.  Neurological: Positive for weakness. Negative for dizziness, sensory change and focal weakness.  All other systems reviewed and are negative.   Nutrition: Regular Tolerating Diet: Yes Tolerating PT: Await evaluation    DRUG ALLERGIES:   Allergies  Allergen Reactions  . Aspirin Nausea And Vomiting  . Codeine Nausea And Vomiting  . Morphine And Related Nausea And Vomiting    VITALS:  Blood pressure 138/58, pulse 81, temperature 97.8 F (36.6 C), temperature source Oral, resp. rate 20, height '5\' 2"'$  (1.575 m), weight 76.794 kg (169 lb 4.8 oz), SpO2 94 %.  PHYSICAL EXAMINATION:   Physical Exam  GENERAL:  70 y.o.-year-old patient sitting up in chair in no acute distress.  EYES: Pupils equal, round, reactive to light and accommodation. No scleral icterus. Extraocular muscles intact.  HEENT: Head atraumatic, normocephalic. Oropharynx and nasopharynx clear.  NECK:  Supple, no jugular venous distention. No thyroid enlargement, no tenderness.  LUNGS: Normal breath sounds bilaterally, no wheezing, rales, rhonchi. No use of accessory muscles of respiration.  CARDIOVASCULAR: S1, S2 normal. No murmurs, rubs, or gallops.  ABDOMEN: Soft, nontender, nondistended. Bowel sounds present. No  organomegaly or mass.  EXTREMITIES: No cyanosis, clubbing or edema b/l.    NEUROLOGIC: Cranial nerves II through XII are intact. No focal Motor or sensory deficits b/l.  Globally weak PSYCHIATRIC: The patient is alert and oriented x 3.  SKIN: No obvious rash, lesion, or ulcer.    LABORATORY PANEL:   CBC  Recent Labs Lab 02/10/16 0359  WBC 2.2*  HGB 8.9*  HCT 26.3*  PLT 29*   ------------------------------------------------------------------------------------------------------------------  Chemistries   Recent Labs Lab 02/08/16 0315 02/09/16 0429  NA 133* 134*  K 3.1* 4.2  CL 97* 106  CO2 28 23  GLUCOSE 103* 117*  BUN 16 18  CREATININE 0.84 0.59  CALCIUM 8.5* 8.3*  AST 24  --   ALT 10*  --   ALKPHOS 53  --   BILITOT 0.7  --    ------------------------------------------------------------------------------------------------------------------  Cardiac Enzymes No results for input(s): TROPONINI in the last 168 hours. ------------------------------------------------------------------------------------------------------------------  RADIOLOGY:  No results found.   ASSESSMENT AND PLAN:   70 year old female with past medical history of colon cancer currently undergoing chemoradiation, essential hypertension, COPD, degenerative disc disease, hyperlipidemia, anxiety who presented to the hospital due to nausea, diarrhea and generalized weakness.  1. Acute diarrhea-this is acute on chronic in nature. secondary to underlying chemotherapy and also underlying infectious diarrhea. Patient's gastrointestinal PCR is positive for entero-pathogenic Escherichia coli. - cont. Cipro, will add PRN Imodium.  - follow clinically.   2. Generalized weakness-multifactorial and related to underlying diarrhea, chemotherapy, underlying malignancy. - slow to improve. Cont. Abx, imodium.  PT eval noted and will arrange home health upon discharge.   3. Pancytopenia-secondary to underlying  malignancy and  also chemotherapy. - counts stable and will monitor.   4. Dehydration-secondary to the ongoing diarrhea.  - improved w/ IV fluids.   5. Hypomagnesemia/hypokalemia-secondary to underlying dehydration and diarrhea. - improved w/ supplementation and will monitor.   6. History of COPD-no acute exacerbation presently. -Continue albuterol nebulizers as needed.  7. Essential hypertension-continue Norvasc.  8. Anxiety-continue Xanax as needed.  Likely d/c home tomorrow.   All the records are reviewed and case discussed with Care Management/Social Workerr. Management plans discussed with the patient, family and they are in agreement.  CODE STATUS: DNR  DVT Prophylaxis: Ted's & SCD's  TOTAL TIME TAKING CARE OF THIS PATIENT: 25 minutes.   POSSIBLE D/C IN 1-2 DAYS, DEPENDING ON CLINICAL CONDITION.   [name] M.D on 02/10/2016 at 1:43 PM  Between 7am to 6pm - Pager - [phone]  After 6pm go to [email or web address] - password [email or web address] [facility]  Office  [phone]  CC: Primary care physician; Halina Maidens, MD

## 2016-02-11 ENCOUNTER — Ambulatory Visit: Payer: Medicare PPO

## 2016-02-11 LAB — BASIC METABOLIC PANEL
Anion gap: 5 (ref 5–15)
BUN: 13 mg/dL (ref 6–20)
CO2: 29 mmol/L (ref 22–32)
CREATININE: 0.58 mg/dL (ref 0.44–1.00)
Calcium: 8.5 mg/dL — ABNORMAL LOW (ref 8.9–10.3)
Chloride: 101 mmol/L (ref 101–111)
GFR calc Af Amer: >60 mL/min (ref >60)
GLUCOSE: 71 mg/dL (ref 65–99)
POTASSIUM: 3.8 mmol/L (ref 3.5–5.1)
Sodium: 135 mmol/L (ref 135–145)

## 2016-02-11 LAB — CBC
HEMATOCRIT: 26.1 % — AB (ref 35.0–47.0)
Hemoglobin: 9 g/dL — ABNORMAL LOW (ref 12.0–16.0)
MCH: 32.4 pg (ref 26.0–34.0)
MCHC: 34.6 g/dL (ref 32.0–36.0)
MCV: 93.6 fL (ref 80.0–100.0)
Platelets: 31 10*3/uL — ABNORMAL LOW (ref 150–440)
RBC: 2.79 MIL/uL — ABNORMAL LOW (ref 3.80–5.20)
RDW: 17.5 % — AB (ref 11.5–14.5)
WBC: 1.6 10*3/uL — ABNORMAL LOW (ref 3.6–11.0)

## 2016-02-11 MED ORDER — TBO-FILGRASTIM 300 MCG/0.5ML ~~LOC~~ SOSY: 300.0000 ug | PREFILLED_SYRINGE | Freq: Once | SUBCUTANEOUS | Status: DC

## 2016-02-11 MED ORDER — CIPROFLOXACIN HCL 500 MG PO TABS: 500.0000 mg | ORAL_TABLET | Freq: Two times a day (BID) | ORAL | Status: AC

## 2016-02-11 MED ORDER — LOPERAMIDE HCL 2 MG PO CAPS: 2.0000 mg | ORAL_CAPSULE | Freq: Four times a day (QID) | ORAL | Status: DC | PRN

## 2016-02-11 MED ORDER — CIPROFLOXACIN HCL 500 MG PO TABS
500.0000 mg | ORAL_TABLET | Freq: Two times a day (BID) | ORAL | Status: DC
  Administered 2016-02-11: 09:00:00 500 mg via ORAL
  Filled 2016-02-11: qty 1

## 2016-02-11 NOTE — Progress Notes (Signed)
Physical Therapy Treatment Patient Details Name: Carol Gay MRN: [id number] DOB: October 17, 1945 Today's Date: 02/11/2016    History of Present Illness Pt is a 70 y.o. female presenting to hospital with diarrhea and fatigue.  PMH includes anal carcinoma (active chemo and radiation therapy), port-a-cath 12/10/15 placement, COPD, htn, h/o blood clots, MI, vertigo, claustrophobia.    PT Comments    Pt gradually progressing towards goals, but remains limited by endurance and cardiopulmonary status. She is mod I for bed mobility. Requires min guard for transfers and ambulation up to 225 ft with FWW and min guard. She required frequent standing rest breaks for energy conservation and pursed lip breathing to increase oxygen saturation. SpO2 ranged 86 to 91% during activity on 2L. Pt navigated 2 steps with 1 rail with min guard. Plan to discharge home today with daughter and HHPT.  Follow Up Recommendations  Home health PT;Supervision/Assistance - 24 hour     Equipment Recommendations  Rolling walker with 5" wheels    Recommendations for Other Services       Precautions / Restrictions Precautions Precautions: Fall Precaution Comments: protective precautions Restrictions Weight Bearing Restrictions: No    Mobility  Bed Mobility Overal bed mobility: Modified Independent             General bed mobility comments: Supine to sit  Transfers Overall transfer level: Needs assistance Equipment used: Rolling walker (2 wheeled) Transfers: Sit to/from [facility] Sit to Stand: Min guard Stand pivot transfers: Min guard       General transfer comment: steady with no LOB  Ambulation/Gait Ambulation/Gait assistance: Min guard Ambulation Distance (Feet): 225 Feet Assistive device: Rolling walker (2 wheeled) Gait Pattern/deviations: Decreased stride length;Trunk flexed Gait velocity: reduced Gait velocity interpretation: Below normal speed for age/gender General Gait  Details: Steady but slow gait, with no LOB. Becomes SOB and fatigues quickly.   Stairs Stairs: Yes Stairs assistance: Min guard Stair Management: One rail Left Number of Stairs: 2 General stair comments: steady, one step at the time  Wheelchair Mobility    Modified Rankin (Stroke Patients Only)       Balance Overall balance assessment: Needs assistance Sitting-balance support: No upper extremity supported Sitting balance-Leahy Scale: Normal     Standing balance support: No upper extremity supported Standing balance-Leahy Scale: Good Standing balance comment: maintains static balance independently                    Cognition Arousal/Alertness: Awake/alert Behavior During Therapy: WFL for tasks assessed/performed Overall Cognitive Status: Within Functional Limits for tasks assessed                      Exercises Other Exercises Other Exercises: Pt ambulated up to 225 ft total with FWW and min guard with frequent standing rest breaks about every 75 ft for energy conservation and to perform pursed lip breathing to maintain O2 sats. She desat to 86% occasionally but able to increase to 91% with rest and pursed lip breathing.  Other Exercises: Navigated 2 steps with 1 rails and min guard. Cues for safety, pursed lip breathing and managing O2 cord. Other Exercises: Pt educated on ways to conserve energy by simplifying tasks, taking frequent rest breaks and pursed lip breathing to maintain O2 sats. Pt also educated on safety of managing O2 cord.    General Comments General comments (skin integrity, edema, etc.): 2 L O2      Pertinent Vitals/Pain Pain Assessment: 0-10 Pain Score: 5  Pain Location: rectal area Pain Descriptors / Indicators: Aching;Burning Pain Intervention(s): Limited activity within patient's tolerance;Monitored during session    Home Living                      Prior Function            PT Goals (current goals can now be  found in the care plan section) Acute Rehab PT Goals Patient Stated Goal: to go home PT Goal Formulation: With patient Time For Goal Achievement: 02/23/16 Potential to Achieve Goals: Good Progress towards PT goals: Progressing toward goals    Frequency  Min 2X/week    PT Plan Current plan remains appropriate    Co-evaluation             End of Session Equipment Utilized During Treatment: Gait belt;Oxygen (2L O2) Activity Tolerance: Patient tolerated treatment well;Patient limited by fatigue Patient left: in bed;with call bell/phone within reach;with bed alarm set     Time: 8270-7867 PT Time Calculation (min) (ACUTE ONLY): 25 min  Charges:  $Gait Training: 8-22 mins $Therapeutic Activity: 8-22 mins                    G Codes:      Neoma Laming, PT, DPT  02/11/2016, 11:32 AM [phone]

## 2016-02-11 NOTE — Progress Notes (Signed)
Follow up visit to [facility] referral. Carol Gay will be discharging home today via car. She does require oxygen and a walker, BSC also to be provided per [facility] Hassan Rowan, who has ordered equipment. Home health orders and F2F faxed to referral. Thank you. Flo Shanks RN, BSN, [facility] Liaison [phone] c

## 2016-02-11 NOTE — Discharge Summary (Signed)
[facility] [place] NAME: Carol Gay    MR#:  [id number]  DATE OF BIRTH:  April 14, 1946  DATE OF ADMISSION:  02/08/2016 ADMITTING PHYSICIAN: Harrie Foreman, MD  DATE OF DISCHARGE: 02/11/2016 12:55 PM  PRIMARY CARE PHYSICIAN: Halina Maidens, MD    ADMISSION DIAGNOSIS:  Hypokalemia, gastrointestinal losses [E87.6] Leukopenia [D72.819] Acute diarrhea [R19.7] Squamous cell carcinoma of anus (HCC) [C21.0] Immunosuppressed status ([place]) [D89.9] Generalized weakness [R53.1]  DISCHARGE DIAGNOSIS:  Active Problems:   Weakness   SECONDARY DIAGNOSIS:   Past Medical History  Diagnosis Date  . Cigarette smoker   . Hypertension   . Arm pain   . Rectal bleeding   . COPD (chronic obstructive pulmonary disease) ([place])   . Hemorrhoids   . CAD in native artery   . Pulmonary emphysema ([place])   . Dyslipidemia   . Dermatophytoses   . DDD (degenerative disc disease), lumbar        . Aortic atherosclerosis ([place])   . H/O blood clots     left leg, veins stripped  . Shortness of breath dyspnea   . Smokers' cough ([place])   . Myocardial infarction [facility])     "mild" - age 53  . Congenital absence of one kidney     born with only right kidney  . Vertigo   . Wears dentures     has full upper and lower - doesn't wear  . Asthma   . Claustrophobia   . Last menstrual period (LMP) > 10 days ago [place]:   70 year old female with past medical history of colon cancer currently undergoing chemoradiation, essential hypertension, COPD, degenerative disc disease, hyperlipidemia, anxiety who presented to the hospital due to nausea, diarrhea and generalized weakness.  1. Acute diarrhea-this is acute on chronic in nature. This was secondary to underlying chemotherapy and also underlying infectious diarrhea. Patient's gastrointestinal PCR was positive for entero-pathogenic Escherichia coli. -Patient was started on oral ciprofloxacin and as  needed Imodium and her clinical symptoms have improved. -She is tolerating a regular diet and her diarrhea has improved and therefore being discharged home.  2. Generalized weakness-multifactorial and related to underlying diarrhea, chemotherapy, underlying malignancy. -Much improved with treatment of underlying diarrhea. Patient was seen by physical therapy and recommended home health services which is being arranged for her prior to discharge.  3. Pancytopenia-secondary to underlying malignancy and also chemotherapy. - counts stable and will can be further monitored as outpatient at the cancer center.    4. Dehydration-secondary to diarrhea.  - resolved w/ IV fluids.   5. Hypomagnesemia/hypokalemia-secondary to underlying dehydration and diarrhea. -Removed and resolved with IV fluid hydration and supplementation.  6. History of COPD-no acute exacerbation presently. -She will continue her inhalers Anoro-Ellipta, Albuterol. - she did qualify for Home O2 which was arranged for her prior to discharge.    7. Essential hypertension- she will continue Norvasc.  8. Anxiety- she will continue Xanax as needed.  DISCHARGE CONDITIONS:   Stable  CONSULTS OBTAINED:     DRUG ALLERGIES:   Allergies  Allergen Reactions  . Aspirin Nausea And Vomiting  . Codeine Nausea And Vomiting  . Morphine And Related Nausea And Vomiting    DISCHARGE MEDICATIONS:   Discharge Medication List as of 02/11/2016 11:42 AM    START taking these medications   Details  ciprofloxacin (CIPRO) 500 MG tablet Take 1 tablet (500 mg total) by mouth 2 (two) times daily., Starting 02/11/2016, Until  Fri 02/13/16, Print    loperamide (IMODIUM) 2 MG capsule Take 1 capsule (2 mg total) by mouth every 6 (six) hours as needed for diarrhea or loose stools., Starting 02/11/2016, Until Discontinued, Print      CONTINUE these medications which have NOT CHANGED   Details  acetaminophen (TYLENOL) 500 MG tablet Take 1,000 mg by  mouth every 6 (six) hours as needed., Until Discontinued, Historical Med    albuterol (PROVENTIL HFA;VENTOLIN HFA) 108 (90 Base) MCG/ACT inhaler Inhale 2 puffs into the lungs every 6 (six) hours as needed for wheezing or shortness of breath., Starting 09/19/2015, Until Discontinued, Normal    ALPRAZolam (XANAX) 0.25 MG tablet Take 1 tablet (0.25 mg total) by mouth 2 (two) times daily as needed for anxiety., Starting 12/23/2015, Until Discontinued, Print    amLODipine (NORVASC) 5 MG tablet Take 1 tablet (5 mg total) by mouth daily., Starting 09/23/2015, Until Discontinued, Normal    atorvastatin (LIPITOR) 20 MG tablet Take 1 tablet (20 mg total) by mouth at bedtime., Starting 09/23/2015, Until Discontinued, Normal    lidocaine-prilocaine (EMLA) cream Apply 1 application topically as needed., Starting 12/10/2015, Until Discontinued, Normal    meclizine (ANTIVERT) 25 MG tablet Take 25 mg by mouth 3 (three) times daily as needed for dizziness., Until Discontinued, Historical Med    ondansetron (ZOFRAN) 8 MG tablet Take 1 tablet (8 mg total) by mouth every 8 (eight) hours as needed for nausea or vomiting., Starting 01/29/2016, Until Discontinued, Normal    prochlorperazine (COMPAZINE) 10 MG tablet Take 1 tablet (10 mg total) by mouth every 6 (six) hours as needed for nausea or vomiting., Starting 01/08/2016, Until Discontinued, Normal    silver sulfADIAZINE (SILVADENE) 1 % cream Apply 1 application topically 2 (two) times daily., Starting 02/03/2016, Until Discontinued, Normal    sucralfate (CARAFATE) 1 g tablet Take 1 tablet (1 g total) by mouth 3 (three) times daily. Dissolve in 2-3 tbsp warm water, swish and swallow, Starting 12/23/2015, Until Discontinued, Normal    traMADol-acetaminophen (ULTRACET) 37.5-325 MG tablet Take 1-2 tablets by mouth every 4 (four) hours as needed for moderate pain., Starting 12/10/2015, Until Discontinued, Print    Umeclidinium-Vilanterol (ANORO ELLIPTA) 62.5-25 MCG/INH AEPB  Inhale 1 puff into the lungs daily., Starting 09/23/2015, Until Discontinued, Normal      STOP taking these medications     phenazopyridine (PYRIDIUM) 200 MG tablet          DISCHARGE INSTRUCTIONS:   DIET:  Cardiac diet  DISCHARGE CONDITION:  Stable  ACTIVITY:  Activity as tolerated  OXYGEN:  Home Oxygen: Yes.     Oxygen Delivery: 2 liters/min via Patient connected to nasal cannula oxygen  DISCHARGE LOCATION:  Home with home health nursing, physical therapy.   If you experience worsening of your admission symptoms, develop shortness of breath, life threatening emergency, suicidal or homicidal thoughts you must seek medical attention immediately by calling 911 or calling your MD immediately  if symptoms less severe.  You Must read complete instructions/literature along with all the possible adverse reactions/side effects for all the Medicines you take and that have been prescribed to you. Take any new Medicines after you have completely understood and accpet all the possible adverse reactions/side effects.   Please note  You were cared for by a hospitalist during your hospital stay. If you have any questions about your discharge medications or the care you received while you were in the hospital after you are discharged, you can call the unit and asked to  speak with the hospitalist on call if the hospitalist that took care of you is not available. Once you are discharged, your primary care physician will handle any further medical issues. Please note that NO REFILLS for any discharge medications will be authorized once you are discharged, as it is imperative that you return to your primary care physician (or establish a relationship with a primary care physician if you do not have one) for your aftercare needs so that they can reassess your need for medications and monitor your lab values.     Today   Diarrhea has improved. Overall feels better. Remains pancytopenic.  VITAL  SIGNS:  Blood pressure 110/50, pulse 68, temperature 97.5 F (36.4 C), temperature source Oral, resp. rate 19, height '5\' 2"'$  (1.575 m), weight 75.66 kg (166 lb 12.8 oz), SpO2 91 %.  I/O:   Intake/Output Summary (Last 24 hours) at 02/11/16 1534 Last data filed at 02/10/16 1900  Gross per 24 hour  Intake    240 ml  Output      0 ml  Net    240 ml    PHYSICAL EXAMINATION:  GENERAL:  70 y.o.-year-old patient lying in the bed with no acute distress.  EYES: Pupils equal, round, reactive to light and accommodation. No scleral icterus. Extraocular muscles intact.  HEENT: Head atraumatic, normocephalic. Oropharynx and nasopharynx clear.  NECK:  Supple, no jugular venous distention. No thyroid enlargement, no tenderness.  LUNGS: Normal breath sounds bilaterally, no wheezing, rales,rhonchi. No use of accessory muscles of respiration.  CARDIOVASCULAR: S1, S2 normal. No murmurs, rubs, or gallops.  ABDOMEN: Soft, non-tender, non-distended. Bowel sounds present. No organomegaly or mass.  EXTREMITIES: No pedal edema, cyanosis, or clubbing.  NEUROLOGIC: Cranial nerves II through XII are intact. No focal motor or sensory defecits b/l.  PSYCHIATRIC: The patient is alert and oriented x 3.   SKIN: No obvious rash, lesion, or ulcer.   DATA REVIEW:   CBC  Recent Labs Lab 02/11/16 0523  WBC 1.6*  HGB 9.0*  HCT 26.1*  PLT 31*    Chemistries   Recent Labs Lab 02/08/16 0315  02/11/16 0523  NA 133*  < > 135  K 3.1*  < > 3.8  CL 97*  < > 101  CO2 28  < > 29  GLUCOSE 103*  < > 71  BUN 16  < > 13  CREATININE 0.84  < > 0.58  CALCIUM 8.5*  < > 8.5*  AST 24  --   --   ALT 10*  --   --   ALKPHOS 53  --   --   BILITOT 0.7  --   --   < > = values in this interval not displayed.  Cardiac Enzymes No results for input(s): TROPONINI in the last 168 hours.  Microbiology Results  Results for orders placed or performed during the hospital encounter of 02/08/16  Gastrointestinal Panel by PCR ,  Stool     Status: Abnormal   Collection Time: 02/08/16  4:19 AM  Result Value Ref Range Status   Campylobacter species NOT DETECTED NOT DETECTED Final   Plesimonas shigelloides NOT DETECTED NOT DETECTED Final   Salmonella species NOT DETECTED NOT DETECTED Final   Yersinia enterocolitica NOT DETECTED NOT DETECTED Final   Vibrio species NOT DETECTED NOT DETECTED Final   Vibrio cholerae NOT DETECTED NOT DETECTED Final   Enteroaggregative E coli (EAEC) NOT DETECTED NOT DETECTED Final   Enteropathogenic E coli (EPEC) DETECTED (A) NOT DETECTED  Final    Comment: CRITICAL RESULT CALLED TO, READ BACK BY AND VERIFIED WITH: Tamala Fothergill @ [phone] 02/08/16 [facility]    Enterotoxigenic E coli (ETEC) NOT DETECTED NOT DETECTED Final   Shiga like toxin producing E coli (STEC) NOT DETECTED NOT DETECTED Final   E. coli O157 NOT DETECTED NOT DETECTED Final   Shigella/Enteroinvasive E coli (EIEC) NOT DETECTED NOT DETECTED Final   Cryptosporidium NOT DETECTED NOT DETECTED Final   Cyclospora cayetanensis NOT DETECTED NOT DETECTED Final   Entamoeba histolytica NOT DETECTED NOT DETECTED Final   Giardia lamblia NOT DETECTED NOT DETECTED Final   Adenovirus F40/41 NOT DETECTED NOT DETECTED Final   Astrovirus NOT DETECTED NOT DETECTED Final   Norovirus GI/GII NOT DETECTED NOT DETECTED Final   Rotavirus A NOT DETECTED NOT DETECTED Final   Sapovirus (I, II, IV, and V) NOT DETECTED NOT DETECTED Final  C difficile quick scan w PCR reflex     Status: None   Collection Time: 02/08/16  4:19 AM  Result Value Ref Range Status   C Diff antigen NEGATIVE NEGATIVE Final   C Diff toxin NEGATIVE NEGATIVE Final   C Diff interpretation Negative for C. difficile  Final  Urine culture     Status: None   Collection Time: 02/08/16  5:55 AM  Result Value Ref Range Status   Specimen Description URINE, CATHETERIZED  Final   Special Requests Normal  Final   Culture NO GROWTH Performed at [facility]   Final   Report Status  02/09/2016 FINAL  Final    RADIOLOGY:  No results found.    Management plans discussed with the patient, family and they are in agreement.  CODE STATUS:     Code Status Orders        Start     Ordered   02/08/16 0835  Do not attempt resuscitation (DNR)   Continuous    Question Answer Comment  In the event of cardiac or respiratory ARREST Do not call a "code blue"   In the event of cardiac or respiratory ARREST Do not perform Intubation, CPR, defibrillation or ACLS   In the event of cardiac or respiratory ARREST Use medication by any route, position, wound care, and other measures to relive pain and suffering. May use oxygen, suction and manual treatment of airway obstruction as needed for comfort.      02/08/16 0834    Code Status History    Date Active Date Inactive Code Status Order ID Comments User Context   This patient has a current code status but no historical code status.    Advance Directive Documentation        Most Recent Value   Type of Advance Directive  Living will   Pre-existing out of facility DNR order (yellow form or pink MOST form)     "MOST" Form in Place?        TOTAL TIME TAKING CARE OF THIS PATIENT: 40 minutes.    [name] M.D on 02/11/2016 at 3:34 PM  Between 7am to 6pm - Pager - [phone]  After 6pm go to [email or web address] - password [email or web address] [facility]  Office  [phone]  CC: Primary care physician; Halina Maidens, MD

## 2016-02-11 NOTE — Care Management (Signed)
Discharge to home today per Dr. Heron Sabins. Home oxygen, bedside commode, and rolling walker will be provided by [facility]. Floydene Flock, representative for [facility] updated. Will be followed by [facility] for home health services in the home. Family will transport. Shelbie Ammons RN MSN CCM Care Management [phone]

## 2016-02-11 NOTE — Progress Notes (Signed)
Discharge instructions given and went over with patient at bedside. Prescriptions given. Bedside commode, walker and oxygen delivered to bedside. All questions answered. Patient discharged home with daughter via wheelchair by nursing staff. Madlyn Frankel, RN

## 2016-02-11 NOTE — Care Management Important Message (Signed)
Important Message  Patient Details  Name: Carol Gay MRN: [id number] Date of Birth: June 04, 1946   Medicare Important Message Given:  Yes    Juliann Pulse A Trinidy Masterson 02/11/2016, 10:51 AM

## 2016-02-12 ENCOUNTER — Ambulatory Visit: Payer: Medicare PPO

## 2016-02-13 ENCOUNTER — Ambulatory Visit: Payer: Medicare PPO

## 2016-02-18 ENCOUNTER — Inpatient Hospital Stay: Payer: Medicare PPO

## 2016-02-18 ENCOUNTER — Telehealth: Payer: Self-pay | Admitting: *Deleted

## 2016-02-18 ENCOUNTER — Inpatient Hospital Stay (HOSPITAL_BASED_OUTPATIENT_CLINIC_OR_DEPARTMENT_OTHER): Payer: Medicare PPO | Admitting: Oncology

## 2016-02-18 VITALS — BP 125/75 | HR 93 | Temp 98.2°F | Resp 20 | Wt 157.6 lb

## 2016-02-18 DIAGNOSIS — C210 Malignant neoplasm of anus, unspecified: Secondary | ICD-10-CM

## 2016-02-18 DIAGNOSIS — Z5111 Encounter for antineoplastic chemotherapy: Secondary | ICD-10-CM | POA: Diagnosis present

## 2016-02-18 DIAGNOSIS — J439 Emphysema, unspecified: Secondary | ICD-10-CM

## 2016-02-18 DIAGNOSIS — J449 Chronic obstructive pulmonary disease, unspecified: Secondary | ICD-10-CM | POA: Diagnosis not present

## 2016-02-18 DIAGNOSIS — I252 Old myocardial infarction: Secondary | ICD-10-CM

## 2016-02-18 DIAGNOSIS — R300 Dysuria: Secondary | ICD-10-CM | POA: Diagnosis not present

## 2016-02-18 DIAGNOSIS — I2510 Atherosclerotic heart disease of native coronary artery without angina pectoris: Secondary | ICD-10-CM | POA: Diagnosis not present

## 2016-02-18 DIAGNOSIS — R197 Diarrhea, unspecified: Secondary | ICD-10-CM

## 2016-02-18 DIAGNOSIS — R609 Edema, unspecified: Secondary | ICD-10-CM | POA: Diagnosis not present

## 2016-02-18 DIAGNOSIS — Z803 Family history of malignant neoplasm of breast: Secondary | ICD-10-CM

## 2016-02-18 DIAGNOSIS — R531 Weakness: Secondary | ICD-10-CM

## 2016-02-18 DIAGNOSIS — E785 Hyperlipidemia, unspecified: Secondary | ICD-10-CM

## 2016-02-18 DIAGNOSIS — Z86718 Personal history of other venous thrombosis and embolism: Secondary | ICD-10-CM

## 2016-02-18 DIAGNOSIS — Z79899 Other long term (current) drug therapy: Secondary | ICD-10-CM

## 2016-02-18 DIAGNOSIS — F17210 Nicotine dependence, cigarettes, uncomplicated: Secondary | ICD-10-CM | POA: Diagnosis not present

## 2016-02-18 DIAGNOSIS — F419 Anxiety disorder, unspecified: Secondary | ICD-10-CM | POA: Diagnosis not present

## 2016-02-18 DIAGNOSIS — Q600 Renal agenesis, unilateral: Secondary | ICD-10-CM

## 2016-02-18 DIAGNOSIS — R5383 Other fatigue: Secondary | ICD-10-CM

## 2016-02-18 DIAGNOSIS — I10 Essential (primary) hypertension: Secondary | ICD-10-CM | POA: Diagnosis not present

## 2016-02-18 DIAGNOSIS — R110 Nausea: Secondary | ICD-10-CM

## 2016-02-18 DIAGNOSIS — Z807 Family history of other malignant neoplasms of lymphoid, hematopoietic and related tissues: Secondary | ICD-10-CM | POA: Diagnosis not present

## 2016-02-18 DIAGNOSIS — M5136 Other intervertebral disc degeneration, lumbar region: Secondary | ICD-10-CM

## 2016-02-18 DIAGNOSIS — J45909 Unspecified asthma, uncomplicated: Secondary | ICD-10-CM

## 2016-02-18 LAB — COMPREHENSIVE METABOLIC PANEL
ALBUMIN: 2.9 g/dL — AB (ref 3.5–5.0)
ALK PHOS: 75 U/L (ref 38–126)
ALT: 14 U/L (ref 14–54)
ANION GAP: 6 (ref 5–15)
AST: 18 U/L (ref 15–41)
BILIRUBIN TOTAL: 0.5 mg/dL (ref 0.3–1.2)
BUN: 8 mg/dL (ref 6–20)
CALCIUM: 9 mg/dL (ref 8.9–10.3)
CO2: 28 mmol/L (ref 22–32)
Chloride: 100 mmol/L — ABNORMAL LOW (ref 101–111)
Creatinine, Ser: 0.54 mg/dL (ref 0.44–1.00)
GLUCOSE: 111 mg/dL — AB (ref 65–99)
POTASSIUM: 3.3 mmol/L — AB (ref 3.5–5.1)
Sodium: 134 mmol/L — ABNORMAL LOW (ref 135–145)
TOTAL PROTEIN: 7.1 g/dL (ref 6.5–8.1)

## 2016-02-18 LAB — CBC WITH DIFFERENTIAL/PLATELET
Basophils Absolute: 0.1 10*3/uL (ref 0–0.1)
Basophils Relative: 1 %
Eosinophils Absolute: 0 10*3/uL (ref 0–0.7)
Eosinophils Relative: 0 %
HEMATOCRIT: 28.3 % — AB (ref 35.0–47.0)
HEMOGLOBIN: 9.6 g/dL — AB (ref 12.0–16.0)
LYMPHS ABS: 1.3 10*3/uL (ref 1.0–3.6)
LYMPHS PCT: 30 %
MCH: 30.4 pg (ref 26.0–34.0)
MCHC: 34 g/dL (ref 32.0–36.0)
MCV: 89.3 fL (ref 80.0–100.0)
MONO ABS: 0.5 10*3/uL (ref 0.2–0.9)
MONOS PCT: 11 %
NEUTROS ABS: 2.6 10*3/uL (ref 1.4–6.5)
NEUTROS PCT: 58 %
Platelets: 129 10*3/uL — ABNORMAL LOW (ref 150–440)
RBC: 3.17 MIL/uL — ABNORMAL LOW (ref 3.80–5.20)
RDW: 19.2 % — AB (ref 11.5–14.5)
WBC: 4.5 10*3/uL (ref 3.6–11.0)

## 2016-02-18 MED ORDER — ONDANSETRON HCL 8 MG PO TABS: 8.0000 mg | ORAL_TABLET | Freq: Three times a day (TID) | ORAL | Status: DC | PRN

## 2016-02-18 MED ORDER — SODIUM CHLORIDE 0.9% FLUSH
10.0000 mL | Freq: Once | INTRAVENOUS | Status: AC
  Administered 2016-02-18: 10 mL via INTRAVENOUS
  Filled 2016-02-18: qty 10

## 2016-02-18 MED ORDER — HEPARIN SOD (PORK) LOCK FLUSH 100 UNIT/ML IV SOLN
500.0000 [IU] | Freq: Once | INTRAVENOUS | Status: AC
  Administered 2016-02-18: 500 [IU] via INTRAVENOUS
  Filled 2016-02-18: qty 5

## 2016-02-18 NOTE — Progress Notes (Signed)
[place]  [phone]  [phone]     Carol Gay DOB: 09/26/1945  MR#: [id number]  OMB#:[id number]  Patient Care Team: Glean Hess, MD as PCP - General (Family Medicine) Clent Jacks, RN as Registered Nurse  CHIEF COMPLAINT:  Chief Complaint  Patient presents with  . anal cancer    INTERVAL HISTORY:   Patient returns to clinic today for further evaluation after hospital stay. She was in the hospital for a week with diarrhea, weakness, and dehydration. She feels well today and diarrhea is resolved. She does have bilateral swelling in her feet. She has shortness of breath on exertion and does wear O2 as needed.  She does not complain of pain or bleeding at this time. She has no neurologic complaints. She denies any recent fevers. She has no chest pain. Patient offers no further specific complaints today.  REVIEW OF SYSTEMS:   Review of Systems  Constitutional: Positive for malaise/fatigue. Negative for fever, weight loss and diaphoresis.  HENT: Negative.   Eyes: Negative.   Respiratory: Positive for shortness of breath. Negative for cough.   Cardiovascular: Negative.  Negative for chest pain and leg swelling.  Gastrointestinal: Negative for heartburn, vomiting, abdominal pain, diarrhea, constipation, blood in stool and melena.  Genitourinary: Negative.   Musculoskeletal: Negative.   Skin: Negative.   Neurological: Positive for weakness.  Endo/Heme/Allergies: Does not bruise/bleed easily.  Psychiatric/Behavioral: The patient is nervous/anxious.     As per HPI. Otherwise, a complete review of systems is negatve.   PAST MEDICAL HISTORY: Past Medical History  Diagnosis Date  . Cigarette smoker   . Hypertension   . Arm pain   . Rectal bleeding   . COPD (chronic obstructive pulmonary disease) ([place])   . Hemorrhoids   . CAD in native artery   . Pulmonary emphysema ([place])   . Dyslipidemia   . Dermatophytoses   . DDD  (degenerative disc disease), lumbar        . Aortic atherosclerosis ([place])   . H/O blood clots     left leg, veins stripped  . Shortness of breath dyspnea   . Smokers' cough ([place])   . Myocardial infarction [facility])     "mild" - age 70  . Congenital absence of one kidney     born with only right kidney  . Vertigo   . Wears dentures     has full upper and lower - doesn't wear  . Asthma   . Claustrophobia   . Last menstrual period (LMP) > 10 days ago 1994    PAST SURGICAL HISTORY: Past Surgical History  Procedure Laterality Date  . Vein ligation and stripping    . Ovarian cyst removal Left   . Appendectomy    . Lumbar disc surgery  1997  . Breast biopsy    . Exploratory laparotomy    . Lesion excision  11/03/15    Anal - Dr Dahlia Byes, [facility]  . Colonoscopy with propofol N/A 11/06/2015    Procedure: COLONOSCOPY WITH PROPOFOL;  Surgeon: Lucilla Lame, MD;  Location: [place];  Service: Endoscopy;  Laterality: N/A;  LEAVE PT EARLY  . Polypectomy  11/06/2015    Procedure: POLYPECTOMY;  Surgeon: Lucilla Lame, MD;  Location: [place];  Service: Endoscopy;;  . Portacath placement N/A 12/10/2015    Procedure: INSERTION PORT-A-CATH;  Surgeon: Jules Husbands, MD;  Location: [facility];  Service: General;  Laterality: N/A;    FAMILY HISTORY Family History  Problem Relation Age of Onset  . Cancer Mother     lymphoma  . Heart disease Mother   . Cancer Maternal Grandmother     breast    GYNECOLOGIC HISTORY:  No LMP recorded. Patient is postmenopausal.     ADVANCED DIRECTIVES:    HEALTH MAINTENANCE: Social History  Substance Use Topics  . Smoking status: Current Every Day Smoker -- 1.00 packs/day for 57 years    Types: Cigarettes  . Smokeless tobacco: Not on file     Comment: not quite ready  . Alcohol Use: No     Allergies  Allergen Reactions  . Aspirin Nausea And Vomiting  . Codeine Nausea And Vomiting  . Morphine And Related Nausea And Vomiting     Current Outpatient Prescriptions  Medication Sig Dispense Refill  . acetaminophen (TYLENOL) 500 MG tablet Take 1,000 mg by mouth every 6 (six) hours as needed.    Marland Kitchen albuterol (PROVENTIL HFA;VENTOLIN HFA) 108 (90 Base) MCG/ACT inhaler Inhale 2 puffs into the lungs every 6 (six) hours as needed for wheezing or shortness of breath. 3 Inhaler 1  . ALPRAZolam (XANAX) 0.25 MG tablet Take 1 tablet (0.25 mg total) by mouth 2 (two) times daily as needed for anxiety. 60 tablet 0  . amLODipine (NORVASC) 5 MG tablet Take 1 tablet (5 mg total) by mouth daily. 90 tablet 1  . atorvastatin (LIPITOR) 20 MG tablet Take 1 tablet (20 mg total) by mouth at bedtime. 90 tablet 1  . lidocaine-prilocaine (EMLA) cream Apply 1 application topically as needed. 30 g 3  . loperamide (IMODIUM) 2 MG capsule Take 1 capsule (2 mg total) by mouth every 6 (six) hours as needed for diarrhea or loose stools. 30 capsule 0  . meclizine (ANTIVERT) 25 MG tablet Take 25 mg by mouth 3 (three) times daily as needed for dizziness.    . ondansetron (ZOFRAN) 8 MG tablet Take 1 tablet (8 mg total) by mouth every 8 (eight) hours as needed for nausea or vomiting. 30 tablet 1  . prochlorperazine (COMPAZINE) 10 MG tablet Take 1 tablet (10 mg total) by mouth every 6 (six) hours as needed for nausea or vomiting. 30 tablet 1  . silver sulfADIAZINE (SILVADENE) 1 % cream Apply 1 application topically 2 (two) times daily. 50 g 2  . sucralfate (CARAFATE) 1 g tablet Take 1 tablet (1 g total) by mouth 3 (three) times daily. Dissolve in 2-3 tbsp warm water, swish and swallow 90 tablet 3  . traMADol-acetaminophen (ULTRACET) 37.5-325 MG tablet Take 1-2 tablets by mouth every 4 (four) hours as needed for moderate pain. 30 tablet 0  . Umeclidinium-Vilanterol (ANORO ELLIPTA) 62.5-25 MCG/INH AEPB Inhale 1 puff into the lungs daily. 180 each 1   No current facility-administered medications for this visit.   Facility-Administered Medications Ordered in Other  Visits  Medication Dose Route Frequency Provider Last Rate Last Dose  . heparin lock flush 100 unit/mL  500 Units Intravenous Once Lloyd Huger, MD        OBJECTIVE: BP 125/75 mmHg  Pulse 93  Temp(Src) 98.2 F (36.8 C) (Tympanic)  Resp 20  Wt 157 lb 10.1 oz (71.5 kg)   Body mass index is 28.82 kg/(m^2).    ECOG FS:0 - Asymptomatic  General: Well-developed, well-nourished, no acute distress. Eyes: Pink conjunctiva, anicteric sclera. Lungs: Clear to auscultation bilaterally. Heart: Regular rate and rhythm. No rubs, murmurs, or gallops. Abdomen: Soft, nontender, nondistended. No organomegaly noted, normoactive bowel sounds. Musculoskeletal: mild nonpitting edema  bilateral feet.                 Neuro: Alert, answering all questions appropriately. Cranial nerves grossly intact. Skin: No rashes or petechiae noted. Psych: Normal affect.   LAB RESULTS:  Appointment on 02/18/2016  Component Date Value Ref Range Status  . WBC 02/18/2016 4.5  3.6 - 11.0 K/uL Final  . RBC 02/18/2016 3.17* 3.80 - 5.20 MIL/uL Final  . Hemoglobin 02/18/2016 9.6* 12.0 - 16.0 g/dL Final  . HCT 02/18/2016 28.3* 35.0 - 47.0 % Final  . MCV 02/18/2016 89.3  80.0 - 100.0 fL Final  . MCH 02/18/2016 30.4  26.0 - 34.0 pg Final  . MCHC 02/18/2016 34.0  32.0 - 36.0 g/dL Final  . RDW 02/18/2016 19.2* 11.5 - 14.5 % Final  . Platelets 02/18/2016 129* 150 - 440 K/uL Final  . Neutrophils Relative % 02/18/2016 58   Final  . Neutro Abs 02/18/2016 2.6  1.4 - 6.5 K/uL Final  . Lymphocytes Relative 02/18/2016 30   Final  . Lymphs Abs 02/18/2016 1.3  1.0 - 3.6 K/uL Final  . Monocytes Relative 02/18/2016 11   Final  . Monocytes Absolute 02/18/2016 0.5  0.2 - 0.9 K/uL Final  . Eosinophils Relative 02/18/2016 0   Final  . Eosinophils Absolute 02/18/2016 0.0  0 - 0.7 K/uL Final  . Basophils Relative 02/18/2016 1   Final  . Basophils Absolute 02/18/2016 0.1  0 - 0.1 K/uL Final  . Sodium 02/18/2016 134* 135 - 145 mmol/L  Final  . Potassium 02/18/2016 3.3* 3.5 - 5.1 mmol/L Final  . Chloride 02/18/2016 100* 101 - 111 mmol/L Final  . CO2 02/18/2016 28  22 - 32 mmol/L Final  . Glucose, Bld 02/18/2016 111* 65 - 99 mg/dL Final  . BUN 02/18/2016 8  6 - 20 mg/dL Final  . Creatinine, Ser 02/18/2016 0.54  0.44 - 1.00 mg/dL Final  . Calcium 02/18/2016 9.0  8.9 - 10.3 mg/dL Final  . Total Protein 02/18/2016 7.1  6.5 - 8.1 g/dL Final  . Albumin 02/18/2016 2.9* 3.5 - 5.0 g/dL Final  . AST 02/18/2016 18  15 - 41 U/L Final  . ALT 02/18/2016 14  14 - 54 U/L Final  . Alkaline Phosphatase 02/18/2016 75  38 - 126 U/L Final  . Total Bilirubin 02/18/2016 0.5  0.3 - 1.2 mg/dL Final  . GFR calc non Af Amer 02/18/2016 >60  >60 mL/min Final  . GFR calc Af Amer 02/18/2016 >60  >60 mL/min Final   Comment: (NOTE) The eGFR has been calculated using the CKD EPI equation. This calculation has not been validated in all clinical situations. eGFR's persistently <60 mL/min signify possible Chronic Kidney Disease.   . Anion gap 02/18/2016 6  5 - 15 Final    STUDIES: No results found.  ASSESSMENT:  Anal carcinoma, stage II, T2 N0 M0.  PLAN:   1. Anal carcinoma: Discontinued XRT with 2 remaining sessions not completed due to hospitalization. She has now completed all of her therapy.  Plan to do a PET scan on March 12, 2016 and follow up for results after scan. She will also need appointment with Dr Allen Norris after scan.   Patient expressed understanding and was in agreement with this plan. She also understands that She can call clinic at any time with any questions, concerns, or complaints.    Squamous cell carcinoma of anus (HCC)   Staging form: Anus, AJCC 7th Edition     Clinical stage  from 11/28/2015: Stage II (T2, N0, M0) - Signed by Lloyd Huger, MD on 11/28/2015   Mayra Reel, NP   02/18/2016 12:09 PM  Patient was seen and evaluated independently and I agree with the assessment and plan as dictated above.  Lloyd Huger, MD 02/23/2016 10:45 PM

## 2016-02-18 NOTE — Progress Notes (Signed)
Patient was admitted to hospital for 1 week with acute diarrhea, weakness, and dehydration.  She is feeling better since being home for the past week.  Does have issues of bilateral leg edema when she is walking.  She is requesting a refill on Xanax and Tramadol today.

## 2016-02-18 NOTE — Telephone Encounter (Signed)
Notified patient of appointment with Dr. Allen Norris on Thursday July 6th @ 1:15. Patient verbalized understanding of appointment details.

## 2016-02-19 ENCOUNTER — Inpatient Hospital Stay: Payer: Self-pay | Admitting: Internal Medicine

## 2016-02-19 ENCOUNTER — Other Ambulatory Visit: Payer: Self-pay | Admitting: *Deleted

## 2016-02-19 DIAGNOSIS — C210 Malignant neoplasm of anus, unspecified: Secondary | ICD-10-CM

## 2016-02-19 MED ORDER — TRAMADOL-ACETAMINOPHEN 37.5-325 MG PO TABS: 1.0000 | ORAL_TABLET | ORAL | Status: DC | PRN

## 2016-02-19 MED ORDER — ALPRAZOLAM 0.25 MG PO TABS: 0.2500 mg | ORAL_TABLET | Freq: Two times a day (BID) | ORAL | Status: DC | PRN

## 2016-02-20 ENCOUNTER — Encounter: Payer: Self-pay | Admitting: Internal Medicine

## 2016-02-20 ENCOUNTER — Ambulatory Visit (INDEPENDENT_AMBULATORY_CARE_PROVIDER_SITE_OTHER): Payer: Medicare PPO | Admitting: Internal Medicine

## 2016-02-20 VITALS — BP 118/78 | HR 91 | Resp 16 | Ht 62.0 in | Wt 158.0 lb

## 2016-02-20 DIAGNOSIS — J431 Panlobular emphysema: Secondary | ICD-10-CM | POA: Diagnosis not present

## 2016-02-20 DIAGNOSIS — F17200 Nicotine dependence, unspecified, uncomplicated: Secondary | ICD-10-CM | POA: Diagnosis not present

## 2016-02-20 DIAGNOSIS — R600 Localized edema: Secondary | ICD-10-CM | POA: Diagnosis not present

## 2016-02-20 DIAGNOSIS — C210 Malignant neoplasm of anus, unspecified: Secondary | ICD-10-CM

## 2016-02-20 NOTE — Patient Instructions (Signed)
Begin Boost or Ensure - 2 cans per day.  Elevate legs during the day as able.  Work on smoking cessation - use patches that you have on hand.

## 2016-02-20 NOTE — Progress Notes (Signed)
Date:  02/20/2016   Name:  Carol Gay   DOB:  1945/10/05   MRN:  [id number]   Chief Complaint: Hospitalization Follow-up HPI Patient was admitted to [facility]  02/08/16 through 02/11/16 for dehydration due to diarrhea.  Stool tested positive for entero-pathogenic E Coli treated with Cipro and immodium with improvement. She is being treated for Stage 2 anal cancer with chemotherapy and XRT.  Seen several days ago by Oncology - labs were good except mild anemia.    COPD - sent home from hospital with oxygen but still smoking.  Using albuterol and Anoro.  She has a Conservation officer, nature and a tank for leaving home.  The tank is too heavy so she does not have it today.  She is getting a smaller one soon.  Wt Readings from Last 3 Encounters:  02/20/16 158 lb (71.668 kg)  02/18/16 157 lb 10.1 oz (71.5 kg)  02/11/16 166 lb 12.8 oz (75.66 kg)    Review of Systems  Constitutional: Positive for unexpected weight change (20 lbs since diagnosis). Negative for fever and chills.  Respiratory: Positive for cough, chest tightness, shortness of breath and wheezing.   Cardiovascular: Positive for leg swelling. Negative for chest pain and palpitations.  Gastrointestinal: Negative for abdominal pain and diarrhea.  Genitourinary: Negative for dysuria.  Neurological: Negative for dizziness, speech difficulty and headaches.  Psychiatric/Behavioral: Negative for dysphoric mood.    Patient Active Problem List   Diagnosis Date Noted  . Weakness 02/08/2016  . Anal cancer ([place])   . 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 Blood in stool   . Benign neoplasm of transverse colon   . Benign neoplasm of rectosigmoid junction   . Neoplasm of digestive system   . Fistula of intestine, excluding rectum and anus   . Pulmonary emphysema ([place]) 10/24/2015  . Encounter for long-term (current) use of medications 02/28/2015  . Aortic atherosclerosis ([place]) 12/30/2014  . CAD in native artery 12/30/2014  . DDD  (degenerative disc disease), lumbar 12/30/2014  . Dyslipidemia 12/30/2014  . Polypharmacy 12/30/2014  . Essential (primary) hypertension 12/30/2014  . Dermatophytoses 12/30/2014  . Compulsive tobacco user syndrome 12/30/2014    Prior to Admission medications   Medication Sig Start Date End Date Taking? Authorizing Provider  acetaminophen (TYLENOL) 500 MG tablet Take 1,000 mg by mouth every 6 (six) hours as needed.    Historical Provider, MD  albuterol (PROVENTIL HFA;VENTOLIN HFA) 108 (90 Base) MCG/ACT inhaler Inhale 2 puffs into the lungs every 6 (six) hours as needed for wheezing or shortness of breath. 09/19/15   Glean Hess, MD  ALPRAZolam Duanne Moron) 0.25 MG tablet Take 1 tablet (0.25 mg total) by mouth 2 (two) times daily as needed for anxiety. 02/19/16   Lloyd Huger, MD  amLODipine (NORVASC) 5 MG tablet Take 1 tablet (5 mg total) by mouth daily. 09/23/15   Glean Hess, MD  atorvastatin (LIPITOR) 20 MG tablet Take 1 tablet (20 mg total) by mouth at bedtime. 09/23/15   Glean Hess, MD  lidocaine-prilocaine (EMLA) cream Apply 1 application topically as needed. 12/10/15   Diego Sarita Haver, MD  loperamide (IMODIUM) 2 MG capsule Take 1 capsule (2 mg total) by mouth every 6 (six) hours as needed for diarrhea or loose stools. 02/11/16   [name], MD  meclizine (ANTIVERT) 25 MG tablet Take 25 mg by mouth 3 (three) times daily as needed for dizziness.    Historical Provider, MD  ondansetron [facility])  8 MG tablet Take 1 tablet (8 mg total) by mouth every 8 (eight) hours as needed for nausea or vomiting. 02/18/16   Lloyd Huger, MD  prochlorperazine (COMPAZINE) 10 MG tablet Take 1 tablet (10 mg total) by mouth every 6 (six) hours as needed for nausea or vomiting. 01/08/16   Lloyd Huger, MD  silver sulfADIAZINE (SILVADENE) 1 % cream Apply 1 application topically 2 (two) times daily. 02/03/16   Noreene Filbert, MD  sucralfate (CARAFATE) 1 g tablet Take 1 tablet (1 g total) by mouth  3 (three) times daily. Dissolve in 2-3 tbsp warm water, swish and swallow 12/23/15   Noreene Filbert, MD  traMADol-acetaminophen (ULTRACET) 37.5-325 MG tablet Take 1-2 tablets by mouth every 4 (four) hours as needed for moderate pain. 02/19/16   Lloyd Huger, MD  Umeclidinium-Vilanterol (ANORO ELLIPTA) 62.5-25 MCG/INH AEPB Inhale 1 puff into the lungs daily. 09/23/15   Glean Hess, MD    Allergies  Allergen Reactions  . Aspirin Nausea And Vomiting  . Codeine Nausea And Vomiting  . Morphine And Related Nausea And Vomiting    Past Surgical History  Procedure Laterality Date  . Vein ligation and stripping    . Ovarian cyst removal Left   . Appendectomy    . Lumbar disc surgery  1997  . Breast biopsy    . Exploratory laparotomy    . Lesion excision  11/03/15    Anal - Dr Dahlia Byes, [facility]  . Colonoscopy with propofol N/A 11/06/2015    Procedure: COLONOSCOPY WITH PROPOFOL;  Surgeon: Lucilla Lame, MD;  Location: [place];  Service: Endoscopy;  Laterality: N/A;  LEAVE PT EARLY  . Polypectomy  11/06/2015    Procedure: POLYPECTOMY;  Surgeon: Lucilla Lame, MD;  Location: [place];  Service: Endoscopy;;  . Portacath placement N/A 12/10/2015    Procedure: INSERTION PORT-A-CATH;  Surgeon: Jules Husbands, MD;  Location: [facility];  Service: General;  Laterality: N/A;    Social History  Substance Use Topics  . Smoking status: Current Every Day Smoker -- 1.00 packs/day for 57 years    Types: Cigarettes  . Smokeless tobacco: None     Comment: not quite ready  . Alcohol Use: No     Medication list has been reviewed and updated.   Physical Exam  Constitutional: She is oriented to person, place, and time. She appears well-developed. No distress.  HENT:  Head: Normocephalic and atraumatic.  Neck: Normal range of motion. Neck supple. No thyromegaly present.  Cardiovascular: Normal rate, regular rhythm, normal heart sounds and intact distal pulses.   Pulmonary/Chest: Effort  normal. She has decreased breath sounds. She has wheezes.  Abdominal: Soft. There is no tenderness.  Musculoskeletal: Normal range of motion. She exhibits edema (1+ both lower legs).  Neurological: She is alert and oriented to person, place, and time.  Skin: Skin is warm, dry and intact. No abrasion and no rash noted. No erythema.  Psychiatric: She has a normal mood and affect. Her speech is normal and behavior is normal. Thought content normal.  Nursing note and vitals reviewed.   BP 118/78 mmHg  Pulse 91  Resp 16  Ht '5\' 2"'$  (1.575 m)  Wt 158 lb (71.668 kg)  BMI 28.89 kg/m2  SpO2 90%  Assessment and Plan: 1. Squamous cell carcinoma of anus (HCC) Completed one cycle chemotx and XRT F/U Pet scan in three weeks Recommend Boost or Ensure twice a day  2. Panlobular emphysema ([place]) Continue O2  and inhaled medications  3. Compulsive tobacco user syndrome Discussed using patches to help quit (used while hospitalized with benefit)  4. Localized edema Reduce sodium Increase protein with supplemented Elevate when able   Halina Maidens, MD [facility]  02/20/2016

## 2016-03-01 ENCOUNTER — Telehealth: Payer: Self-pay

## 2016-03-01 NOTE — Telephone Encounter (Signed)
  Oncology Nurse Navigator Documentation  Navigator Location: [facility] (03/01/16 1500) Navigator Encounter Type: Telephone (03/01/16 1500) Telephone: Outgoing Call;Patient Update (03/01/16 1500)                                        Time Spent with Patient: 30 (03/01/16 1500)   Contacted patient regarding need for colposcopy after radiation/chemo completed. Continues to heal from excoriated anal area. Would like another week to continue healing. Will contact next week for scheduling.

## 2016-03-12 ENCOUNTER — Telehealth: Payer: Self-pay

## 2016-03-12 ENCOUNTER — Other Ambulatory Visit: Payer: Self-pay | Admitting: *Deleted

## 2016-03-12 ENCOUNTER — Ambulatory Visit (HOSPITAL_COMMUNITY): Payer: Medicare PPO

## 2016-03-12 DIAGNOSIS — C210 Malignant neoplasm of anus, unspecified: Secondary | ICD-10-CM

## 2016-03-12 NOTE — Telephone Encounter (Signed)
  Oncology Nurse Navigator Documentation  Navigator Location: [facility] (03/12/16 0900) Navigator Encounter Type: Telephone (03/12/16 0900) Telephone: Incoming Call (03/12/16 0900)                                        Time Spent with Patient: 15 (03/12/16 0900)   Received call from Carol Gay. She reports her PET was cancelled and she has to see Dr Grayland Ormond Monday 6-26 to go over some options. Needs colposcopy still. I will attend appt with her and depending on outcome of that appt we can schedule colposcopy.

## 2016-03-15 ENCOUNTER — Inpatient Hospital Stay: Payer: Medicare PPO | Admitting: Oncology

## 2016-03-15 ENCOUNTER — Ambulatory Visit: Payer: Medicare PPO | Admitting: Radiation Oncology

## 2016-03-15 ENCOUNTER — Inpatient Hospital Stay: Payer: Medicare PPO

## 2016-03-16 ENCOUNTER — Other Ambulatory Visit: Payer: Self-pay | Admitting: Internal Medicine

## 2016-03-22 ENCOUNTER — Other Ambulatory Visit: Payer: Self-pay | Admitting: *Deleted

## 2016-03-22 ENCOUNTER — Encounter: Payer: Self-pay | Admitting: *Deleted

## 2016-03-22 ENCOUNTER — Ambulatory Visit: Payer: Medicare PPO

## 2016-03-22 MED ORDER — TRAMADOL-ACETAMINOPHEN 37.5-325 MG PO TABS: 1.0000 | ORAL_TABLET | ORAL | Status: DC | PRN

## 2016-03-22 MED ORDER — ONDANSETRON HCL 8 MG PO TABS: 8.0000 mg | ORAL_TABLET | Freq: Three times a day (TID) | ORAL | Status: DC | PRN

## 2016-03-24 ENCOUNTER — Other Ambulatory Visit: Payer: Medicare PPO

## 2016-03-24 ENCOUNTER — Ambulatory Visit: Payer: Medicare PPO

## 2016-03-24 ENCOUNTER — Ambulatory Visit: Payer: Medicare PPO | Admitting: Oncology

## 2016-03-25 ENCOUNTER — Ambulatory Visit (INDEPENDENT_AMBULATORY_CARE_PROVIDER_SITE_OTHER): Payer: Medicare PPO | Admitting: Gastroenterology

## 2016-03-25 ENCOUNTER — Encounter
Admission: RE | Admit: 2016-03-25 | Discharge: 2016-03-25 | Disposition: A | Discharge status: Home / Self Care | Payer: Medicare PPO | Source: Ambulatory Visit | Attending: Oncology | Admitting: Oncology

## 2016-03-25 ENCOUNTER — Encounter: Payer: Self-pay | Admitting: Gastroenterology

## 2016-03-25 VITALS — BP 125/66 | HR 101 | Temp 96.3°F | Ht 62.0 in | Wt 158.0 lb

## 2016-03-25 DIAGNOSIS — C210 Malignant neoplasm of anus, unspecified: Secondary | ICD-10-CM | POA: Diagnosis present

## 2016-03-25 DIAGNOSIS — I700 Atherosclerosis of aorta: Secondary | ICD-10-CM | POA: Insufficient documentation

## 2016-03-25 DIAGNOSIS — J439 Emphysema, unspecified: Secondary | ICD-10-CM | POA: Insufficient documentation

## 2016-03-25 LAB — GLUCOSE, CAPILLARY: GLUCOSE-CAPILLARY: 99 mg/dL (ref 65–99)

## 2016-03-25 MED ORDER — FLUDEOXYGLUCOSE F - 18 (FDG) INJECTION
12.0000 | Freq: Once | INTRAVENOUS | Status: AC | PRN
  Administered 2016-03-25: 12.68 via INTRAVENOUS

## 2016-03-25 NOTE — Progress Notes (Signed)
Primary Care Physician: Halina Maidens, MD  Primary Gastroenterologist:  Dr. Lucilla Lame  Chief Complaint  Patient presents with  . Follow-up    Anal Cancer    HPI: Carol Gay is a 70 y.o. female here For follow-up of her anal cancer. The patient states she is mainly here because she is having lower extremity edema and fatigue. The patient has anemia with a hemoglobin around 9.6 recently. The patient denies any black stools or bloody stools. She also reports that her lower extremity swelling happens usually approximate 2 hours after she gets out of bed. The patient also reports that she has a cardiac history with a heart attack many years ago. The patient has been treated with chemotherapy and radiation as reported by her and she has refused any surgery intervention.  Current Outpatient Prescriptions  Medication Sig Dispense Refill  . acetaminophen (TYLENOL) 500 MG tablet Take 1,000 mg by mouth every 6 (six) hours as needed.    Marland Kitchen albuterol (PROVENTIL HFA;VENTOLIN HFA) 108 (90 Base) MCG/ACT inhaler Inhale 2 puffs into the lungs every 6 (six) hours as needed for wheezing or shortness of breath. 3 Inhaler 1  . ALPRAZolam (XANAX) 0.25 MG tablet Take 1 tablet (0.25 mg total) by mouth 2 (two) times daily as needed for anxiety. 60 tablet 0  . amLODipine (NORVASC) 5 MG tablet TAKE 1 TABLET EVERY DAY 90 tablet 1  . atorvastatin (LIPITOR) 20 MG tablet TAKE 1 TABLET AT BEDTIME 90 tablet 1  . lidocaine-prilocaine (EMLA) cream Apply 1 application topically as needed. 30 g 3  . loperamide (IMODIUM) 2 MG capsule Take 1 capsule (2 mg total) by mouth every 6 (six) hours as needed for diarrhea or loose stools. 30 capsule 0  . ondansetron (ZOFRAN) 8 MG tablet Take 1 tablet (8 mg total) by mouth every 8 (eight) hours as needed for nausea or vomiting. 30 tablet 0  . OXYGEN Inhale 2 L/min into the lungs continuous.    . prochlorperazine (COMPAZINE) 10 MG tablet Take 1 tablet (10 mg total) by mouth every 6  (six) hours as needed for nausea or vomiting. 30 tablet 1  . silver sulfADIAZINE (SILVADENE) 1 % cream Apply 1 application topically 2 (two) times daily. 50 g 2  . traMADol-acetaminophen (ULTRACET) 37.5-325 MG tablet Take 1-2 tablets by mouth every 4 (four) hours as needed for moderate pain. 30 tablet 0  . Umeclidinium-Vilanterol (ANORO ELLIPTA) 62.5-25 MCG/INH AEPB Inhale 1 puff into the lungs daily. 180 each 1   No current facility-administered medications for this visit.    Allergies as of 03/25/2016 - Review Complete 03/25/2016  Allergen Reaction Noted  . Aspirin Nausea And Vomiting 12/30/2014  . Codeine Nausea And Vomiting 12/30/2014  . Morphine and related Nausea And Vomiting 10/24/2015    ROS:  General: Negative for anorexia, weight loss, fever, chills, fatigue, weakness. ENT: Negative for hoarseness, difficulty swallowing , nasal congestion. CV: Negative for chest pain, angina, palpitations, dyspnea on exertion, peripheral edema.  Respiratory: Negative for dyspnea at rest, dyspnea on exertion, cough, sputum, wheezing.  GI: See history of present illness. GU:  Negative for dysuria, hematuria, urinary incontinence, urinary frequency, nocturnal urination.  Endo: Negative for unusual weight change.    Physical Examination:   BP 125/66 mmHg  Pulse 101  Temp(Src) 96.3 F (35.7 C) (Oral)  Ht '5\' 2"'$  (1.575 m)  Wt 158 lb (71.668 kg)  BMI 28.89 kg/m2  General: Well-nourished, well-developed in no acute distress.  Eyes: No icterus. Conjunctivae  pink. Extremities: Positive lower extremity edema. No clubbing or deformities. Neuro: Alert and oriented x 3.  Grossly intact. Skin: Warm and dry, no jaundice.   Psych: Alert and cooperative, normal mood and affect.  Labs:    Imaging Studies: Nm Pet Image Restag (ps) Skull Base To Thigh  03/25/2016  CLINICAL DATA:  Subsequent treatment strategy for squamous cell carcinoma of the anus. Patient is approximately 5-6 weeks out from  conclusion of XRT. EXAM: NUCLEAR MEDICINE PET SKULL BASE TO THIGH TECHNIQUE: 12.7 mCi F-18 FDG was injected intravenously. Full-ring PET imaging was performed from the skull base to thigh after the radiotracer. CT data was obtained and used for attenuation correction and anatomic localization. FASTING BLOOD GLUCOSE:  Value: 99 mg/dl COMPARISON:  11/21/2015 FINDINGS: NECK No hypermetabolic lymph nodes in the neck. CHEST No hypermetabolic mediastinal or hilar nodes. Interval development of a 3 x 4 cm peripheral wedge-shaped opacity in the anterior right upper lobe showing low level hypermetabolism with SUV max = 3.3. There is some patchy adjacent alveolar disease. Given the relatively rapid interval development and appearance on CT, this is probably related to an infectious etiology. Centrilobular emphysema noted bilaterally. ABDOMEN/PELVIS No abnormal hypermetabolic activity within the liver, pancreas, adrenal glands, or spleen. No hypermetabolic lymph nodes in the abdomen or pelvis. The hypermetabolic anal activity seen on the previous study has decreased dramatically in the interval. FDG uptake in the anal region is just above background soft tissue levels on today's study with SUV max = 3.7. There is no discrete mass is visible in the anal region on CT imaging today. There is abdominal aortic atherosclerosis without aneurysm. Presumed urinary contaminant noted along the skin of the perineum. SKELETON No focal hypermetabolic activity to suggest skeletal metastasis. IMPRESSION: 1. Marked interval response to therapy with dramatic decrease in the hyper metabolism associated with the patient's known anal carcinoma. 2. Interval development of a peripheral wedge-shaped opacity in the anterior right upper lobe with surrounding pack patchy airspace disease. This shows low level hypermetabolism in the was no opacity at this location on x-ray from 02/08/2016. Given the relatively rapid interval appearance of this finding,  infectious etiology is favored. Imaging findings of potential clinical significance: Abdominal Aortic Atherosclerois (ICD10-170.0) Emphysema. (ICD10-J43.9) 1. Electronically Signed   By: Misty Stanley M.D.   On: 03/25/2016 10:44    Assessment and Plan:   DENYCE HARR is a 70 y.o. y/o female who has a history of anal cancer. The patient now comes for follow-up and reports that she is having fatigue and lower extremity swelling. The patient has a positive cardiac history. The patient has been told that her lower extremity swelling is likely due to her retaining water possibly from cardiac issues. The patient states that when she lays down at night the swelling goes down but it occurs approximately 2 hours after what waking up and moving around. The patient has also been told that her anemia can also cause her to have fatigue. The patient recently had a PET scan that is to follow up with oncology for her anal cancer. The patient has been told that she needs a repeat colonoscopy she should contact me otherwise she should talk to her primary care physician about her lower extremity edema.   Note: This dictation was prepared with Dragon dictation along with smaller phrase technology. Any transcriptional errors that result from this process are unintentional.

## 2016-03-30 ENCOUNTER — Encounter: Payer: Self-pay | Admitting: Radiation Oncology

## 2016-03-30 ENCOUNTER — Inpatient Hospital Stay: Payer: Medicare PPO | Attending: Oncology | Admitting: Oncology

## 2016-03-30 ENCOUNTER — Ambulatory Visit
Admission: RE | Admit: 2016-03-30 | Discharge: 2016-03-30 | Disposition: A | Discharge status: Home / Self Care | Payer: Medicare PPO | Source: Ambulatory Visit | Attending: Radiation Oncology | Admitting: Radiation Oncology

## 2016-03-30 ENCOUNTER — Inpatient Hospital Stay: Payer: Medicare PPO

## 2016-03-30 VITALS — BP 122/64 | HR 99 | Temp 97.3°F | Resp 20 | Wt 162.9 lb

## 2016-03-30 DIAGNOSIS — M7989 Other specified soft tissue disorders: Secondary | ICD-10-CM | POA: Insufficient documentation

## 2016-03-30 DIAGNOSIS — F17210 Nicotine dependence, cigarettes, uncomplicated: Secondary | ICD-10-CM | POA: Diagnosis not present

## 2016-03-30 DIAGNOSIS — C210 Malignant neoplasm of anus, unspecified: Secondary | ICD-10-CM

## 2016-03-30 DIAGNOSIS — Z923 Personal history of irradiation: Secondary | ICD-10-CM | POA: Diagnosis not present

## 2016-03-30 DIAGNOSIS — R5383 Other fatigue: Secondary | ICD-10-CM | POA: Diagnosis not present

## 2016-03-30 DIAGNOSIS — J439 Emphysema, unspecified: Secondary | ICD-10-CM | POA: Insufficient documentation

## 2016-03-30 DIAGNOSIS — Z79899 Other long term (current) drug therapy: Secondary | ICD-10-CM | POA: Insufficient documentation

## 2016-03-30 DIAGNOSIS — I890 Lymphedema, not elsewhere classified: Secondary | ICD-10-CM | POA: Diagnosis not present

## 2016-03-30 DIAGNOSIS — R109 Unspecified abdominal pain: Secondary | ICD-10-CM | POA: Diagnosis not present

## 2016-03-30 DIAGNOSIS — R0602 Shortness of breath: Secondary | ICD-10-CM | POA: Insufficient documentation

## 2016-03-30 DIAGNOSIS — Q600 Renal agenesis, unilateral: Secondary | ICD-10-CM | POA: Diagnosis not present

## 2016-03-30 DIAGNOSIS — R05 Cough: Secondary | ICD-10-CM | POA: Insufficient documentation

## 2016-03-30 DIAGNOSIS — J449 Chronic obstructive pulmonary disease, unspecified: Secondary | ICD-10-CM

## 2016-03-30 DIAGNOSIS — I2510 Atherosclerotic heart disease of native coronary artery without angina pectoris: Secondary | ICD-10-CM | POA: Insufficient documentation

## 2016-03-30 DIAGNOSIS — I252 Old myocardial infarction: Secondary | ICD-10-CM | POA: Insufficient documentation

## 2016-03-30 DIAGNOSIS — R918 Other nonspecific abnormal finding of lung field: Secondary | ICD-10-CM | POA: Diagnosis not present

## 2016-03-30 DIAGNOSIS — Q602 Renal agenesis, unspecified: Secondary | ICD-10-CM | POA: Diagnosis not present

## 2016-03-30 DIAGNOSIS — K649 Unspecified hemorrhoids: Secondary | ICD-10-CM | POA: Diagnosis not present

## 2016-03-30 DIAGNOSIS — Z86718 Personal history of other venous thrombosis and embolism: Secondary | ICD-10-CM | POA: Diagnosis not present

## 2016-03-30 DIAGNOSIS — I10 Essential (primary) hypertension: Secondary | ICD-10-CM | POA: Insufficient documentation

## 2016-03-30 DIAGNOSIS — Z452 Encounter for adjustment and management of vascular access device: Secondary | ICD-10-CM | POA: Diagnosis not present

## 2016-03-30 DIAGNOSIS — Z809 Family history of malignant neoplasm, unspecified: Secondary | ICD-10-CM | POA: Diagnosis not present

## 2016-03-30 DIAGNOSIS — E785 Hyperlipidemia, unspecified: Secondary | ICD-10-CM | POA: Diagnosis not present

## 2016-03-30 DIAGNOSIS — M5136 Other intervertebral disc degeneration, lumbar region: Secondary | ICD-10-CM | POA: Diagnosis not present

## 2016-03-30 DIAGNOSIS — Z9221 Personal history of antineoplastic chemotherapy: Secondary | ICD-10-CM | POA: Insufficient documentation

## 2016-03-30 LAB — COMPREHENSIVE METABOLIC PANEL
ALT: 9 U/L — AB (ref 14–54)
AST: 15 U/L (ref 15–41)
Albumin: 3 g/dL — ABNORMAL LOW (ref 3.5–5.0)
Alkaline Phosphatase: 54 U/L (ref 38–126)
Anion gap: 6 (ref 5–15)
BILIRUBIN TOTAL: 0.3 mg/dL (ref 0.3–1.2)
BUN: 9 mg/dL (ref 6–20)
CHLORIDE: 100 mmol/L — AB (ref 101–111)
CO2: 30 mmol/L (ref 22–32)
CREATININE: 0.68 mg/dL (ref 0.44–1.00)
Calcium: 9.2 mg/dL (ref 8.9–10.3)
GFR calc Af Amer: >60 mL/min (ref >60)
Glucose, Bld: 98 mg/dL (ref 65–99)
Potassium: 3.7 mmol/L (ref 3.5–5.1)
Sodium: 136 mmol/L (ref 135–145)
TOTAL PROTEIN: 6.5 g/dL (ref 6.5–8.1)

## 2016-03-30 LAB — CBC WITH DIFFERENTIAL/PLATELET
BASOS ABS: 0.1 10*3/uL (ref 0–0.1)
Basophils Relative: 1 %
Eosinophils Absolute: 0.6 10*3/uL (ref 0–0.7)
Eosinophils Relative: 8 %
HEMATOCRIT: 33.7 % — AB (ref 35.0–47.0)
Hemoglobin: 11.5 g/dL — ABNORMAL LOW (ref 12.0–16.0)
LYMPHS PCT: 16 %
Lymphs Abs: 1.2 10*3/uL (ref 1.0–3.6)
MCH: 32.6 pg (ref 26.0–34.0)
MCHC: 34.1 g/dL (ref 32.0–36.0)
MCV: 95.5 fL (ref 80.0–100.0)
Monocytes Absolute: 0.8 10*3/uL (ref 0.2–0.9)
Monocytes Relative: 11 %
NEUTROS ABS: 4.8 10*3/uL (ref 1.4–6.5)
Neutrophils Relative %: 64 %
Platelets: 223 10*3/uL (ref 150–440)
RBC: 3.53 MIL/uL — AB (ref 3.80–5.20)
RDW: 18.1 % — ABNORMAL HIGH (ref 11.5–14.5)
WBC: 7.5 10*3/uL (ref 3.6–11.0)

## 2016-03-30 NOTE — Progress Notes (Signed)
Radiation Oncology Follow up Note  Name: Carol Gay   Date:   03/30/2016 MRN:  [id number] DOB: April 13, 1946    This 70 y.o. female presents to the clinic today for follow-up evaluation of her squamous cell carcinoma of the anal canal s/p definitive treatment with radiation therapy and concurrent 5-FU and Mitomycin C  REFERRING PROVIDER: Glean Hess, MD  CHIEF COMPLAINT:  Chief Complaint  Patient presents with  . Cancer    Pt is here for 1 month follow up of anal cancer.       DIAGNOSIS: The encounter diagnosis was Anal cancer ([place]).   HPI: Ms. Carol Gay returned today for routine follow-up evaluation 2 months after completing a definitive course of radiation therapy to 56 Gy completed on 01/30/16 along with concurrent 5-FU and Mitomycin C.  Since completing her radiation therapy she reports that the perianal and rectal pain that she was experiencing has completely resolved.  She does report marked fatigue, chronic cough as well as bilateral distal lower extremity lymphedema.  She also reports some abdominal after eating and well as persistent shortness of breath and dyspnea on exertion, both of which are of long standing duration and presumably secondary to her history of COPD.  She denies vaginal discomfort.  She continues to smoke one pack of cigarettes per day as she has since age 21.    PAST MEDICAL HISTORY:  has a past medical history of Cigarette smoker; Hypertension; Arm pain; Rectal bleeding; COPD (chronic obstructive pulmonary disease) ([place]); Hemorrhoids; CAD in native artery; Pulmonary emphysema ([place]); Dyslipidemia; Dermatophytoses; DDD (degenerative disc disease), lumbar; Aortic atherosclerosis ([place]); H/O blood clots; Shortness of breath dyspnea; Smokers' cough ([place]); Myocardial infarction ([place]); Congenital absence of one kidney; Vertigo; Wears dentures; Asthma; Claustrophobia; and Last menstrual period (LMP) > 10 days ago (1994).    PAST SURGICAL HISTORY:  Past Surgical  History  Procedure Laterality Date  . Vein ligation and stripping    . Ovarian cyst removal Left   . Appendectomy    . Lumbar disc surgery  1997  . Breast biopsy    . Exploratory laparotomy    . Lesion excision  11/03/15    Anal - Dr Carol Gay, Carol Gay surg  . Colonoscopy with propofol N/A 11/06/2015    Procedure: COLONOSCOPY WITH PROPOFOL;  Surgeon: Carol Lame, MD;  Location: [place];  Service: Endoscopy;  Laterality: N/A;  LEAVE PT EARLY  . Polypectomy  11/06/2015    Procedure: POLYPECTOMY;  Surgeon: Carol Lame, MD;  Location: [place];  Service: Endoscopy;;  . Portacath placement N/A 12/10/2015    Procedure: INSERTION PORT-A-CATH;  Surgeon: Carol Husbands, MD;  Location: [facility];  Service: General;  Laterality: N/A;    FAMILY HISTORY: family history includes Cancer in her maternal grandmother and mother; Heart disease in her mother.  SOCIAL HISTORY:  reports that she has been smoking Cigarettes.  She has a 57 pack-year smoking history. She has never used smokeless tobacco. She reports that she does not drink alcohol or use illicit drugs.  ALLERGIES: Aspirin; Codeine; and Morphine and related  MEDICATIONS:  Current Outpatient Prescriptions  Medication Sig Dispense Refill  . acetaminophen (TYLENOL) 500 MG tablet Take 1,000 mg by mouth every 6 (six) hours as needed.    Marland Kitchen albuterol (PROVENTIL HFA;VENTOLIN HFA) 108 (90 Base) MCG/ACT inhaler Inhale 2 puffs into the lungs every 6 (six) hours as needed for wheezing or shortness of breath. 3 Inhaler 1  . ALPRAZolam (XANAX) 0.25 MG tablet Take  1 tablet (0.25 mg total) by mouth 2 (two) times daily as needed for anxiety. 60 tablet 0  . amLODipine (NORVASC) 5 MG tablet TAKE 1 TABLET EVERY DAY 90 tablet 1  . atorvastatin (LIPITOR) 20 MG tablet TAKE 1 TABLET AT BEDTIME 90 tablet 1  . lidocaine-prilocaine (EMLA) cream Apply 1 application topically as needed. 30 g 3  . loperamide (IMODIUM) 2 MG capsule Take 1 capsule (2 mg total) by  mouth every 6 (six) hours as needed for diarrhea or loose stools. 30 capsule 0  . ondansetron (ZOFRAN) 8 MG tablet Take 1 tablet (8 mg total) by mouth every 8 (eight) hours as needed for nausea or vomiting. 30 tablet 0  . OXYGEN Inhale 2 L/min into the lungs continuous.    . prochlorperazine (COMPAZINE) 10 MG tablet Take 1 tablet (10 mg total) by mouth every 6 (six) hours as needed for nausea or vomiting. 30 tablet 1  . traMADol-acetaminophen (ULTRACET) 37.5-325 MG tablet Take 1-2 tablets by mouth every 4 (four) hours as needed for moderate pain. 30 tablet 0  . Umeclidinium-Vilanterol (ANORO ELLIPTA) 62.5-25 MCG/INH AEPB Inhale 1 puff into the lungs daily. 180 each 1  . silver sulfADIAZINE (SILVADENE) 1 % cream Apply 1 application topically 2 (two) times daily. (Patient not taking: Reported on 03/30/2016) 50 g 2   No current facility-administered medications for this encounter.    REVIEW OF SYSTEMS:  Review of Systems  Constitutional: Positive for malaise/fatigue. Negative for fever and weight loss.  HENT: Positive for congestion. Negative for sore throat.   Eyes: Negative for double vision and redness.  Respiratory: Positive for cough, sputum production and shortness of breath. Negative for hemoptysis, wheezing and stridor.   Cardiovascular: Positive for orthopnea, claudication, leg swelling and PND. Negative for chest pain and palpitations.  Gastrointestinal: Positive for heartburn. Negative for nausea, vomiting, diarrhea and constipation.  Genitourinary: Negative for dysuria, urgency and frequency.  Musculoskeletal: Positive for myalgias and joint pain. Negative for back pain.  Skin: Negative for itching and rash.  Neurological: Positive for weakness. Negative for dizziness, speech change, focal weakness, seizures and headaches.  Endo/Heme/Allergies: Does not bruise/bleed easily.  Psychiatric/Behavioral: Negative for depression and memory loss.     PHYSICAL EXAM: BP 122/64 mmHg  Pulse  99  Temp(Src) 97.3 F (36.3 C)  Resp 20  Wt 162 lb 14.7 oz (73.9 kg)  Physical Exam  Constitutional: She is oriented to person, place, and time and well-developed, well-nourished, and in no distress.  HENT:  Head: Normocephalic and atraumatic.  Mouth/Throat: Oropharynx is clear and moist.  Eyes: EOM are normal. Pupils are equal, round, and reactive to light.  Neck: Neck supple. No JVD present. No tracheal deviation present. No thyromegaly present.  Cardiovascular: Normal rate and regular rhythm.   Pulmonary/Chest: Breath sounds normal. No respiratory distress. She has no wheezes. She has no rales.  Abdominal: She exhibits no distension and no mass. There is no tenderness. There is no guarding.  Genitourinary: Rectal exam shows tenderness. Rectal exam shows no mass.  Rectal examination reveals no evidence of palpable masses or lesions.    Musculoskeletal: She exhibits no tenderness.  Lymphadenopathy:    She has no cervical adenopathy.  Neurological: She is alert and oriented to person, place, and time. No cranial nerve deficit. Gait normal.  Skin: Skin is warm and dry. No rash noted. No erythema.  Psychiatric: Mood and memory normal.  Rectal: Digital evaluation reveals the rectal mucosa to be soft and smooth without evidence  of nodularity or local recurrence.   Today, the patient is accompanied by her loving and very supportive daughter. Extremities: There is 2+ bilateral distal lower extremity lymphedema present with mild erythema and slight warmth to palpation.  RADIOLOGY RESULTS:  Nm Pet Image Restag (ps) Skull Base To Thigh  03/25/2016  CLINICAL DATA:  Subsequent treatment strategy for squamous cell carcinoma of the anus. Patient is approximately 5-6 weeks out from conclusion of XRT. EXAM: NUCLEAR MEDICINE PET SKULL BASE TO THIGH TECHNIQUE: 12.7 mCi F-18 FDG was injected intravenously. Full-ring PET imaging was performed from the skull base to thigh after the radiotracer. CT data was  obtained and used for attenuation correction and anatomic localization. FASTING BLOOD GLUCOSE:  Value: 99 mg/dl COMPARISON:  11/21/2015 FINDINGS: NECK No hypermetabolic lymph nodes in the neck. CHEST No hypermetabolic mediastinal or hilar nodes. Interval development of a 3 x 4 cm peripheral wedge-shaped opacity in the anterior right upper lobe showing low level hypermetabolism with SUV max = 3.3. There is some patchy adjacent alveolar disease. Given the relatively rapid interval development and appearance on CT, this is probably related to an infectious etiology. Centrilobular emphysema noted bilaterally. ABDOMEN/PELVIS No abnormal hypermetabolic activity within the liver, pancreas, adrenal glands, or spleen. No hypermetabolic lymph nodes in the abdomen or pelvis. The hypermetabolic anal activity seen on the previous study has decreased dramatically in the interval. FDG uptake in the anal region is just above background soft tissue levels on today's study with SUV max = 3.7. There is no discrete mass is visible in the anal region on CT imaging today. There is abdominal aortic atherosclerosis without aneurysm. Presumed urinary contaminant noted along the skin of the perineum. SKELETON No focal hypermetabolic activity to suggest skeletal metastasis. IMPRESSION: 1. Marked interval response to therapy with dramatic decrease in the hyper metabolism associated with the patient's known anal carcinoma. 2. Interval development of a peripheral wedge-shaped opacity in the anterior right upper lobe with surrounding pack patchy airspace disease. This shows low level hypermetabolism in the was no opacity at this location on x-ray from 02/08/2016. Given the relatively rapid interval appearance of this finding, infectious etiology is favored. Imaging findings of potential clinical significance: Abdominal Aortic Atherosclerois (ICD10-170.0) Emphysema. (ICD10-J43.9) 1. Electronically Signed   By: Misty Stanley M.D.   On: 03/25/2016  10:44     PLAN: I am pleased that Ms. Hauck had an excellent clinical and radiographic response to definitive treatment with concurrent chemotherapy and radiation therapy.  She appears to have recovered nicely from treatment with resolution of her perianal discomfort.  She continues to experience some fatigue, however, I attribute this to her history of COPD.  She will follow-up with medical oncology this afternoon and I have asked to her return for follow-up in radiation oncology in 4 months or sooner should problems arise.  As always, thank you very much for allowing me to participate in the care of this most pleasant woman.    Consuello Bossier, MD

## 2016-03-30 NOTE — Progress Notes (Signed)
States is having swelling in both ankles and feet today that are causing some pain. Requests refill on ondansetron and xanax.

## 2016-03-30 NOTE — Progress Notes (Signed)
[place]  [phone]  [phone]     Carol Gay DOB: 04/02/1946  MR#: [id number]  QMG#:[id number]  Patient Care Team: Glean Hess, MD as PCP - General (Family Medicine) Clent Jacks, RN as Registered Nurse Lucilla Lame, MD as Consulting Physician (Gastroenterology)  CHIEF COMPLAINT:  Squamous cell carcinoma of the anus, stage II, T2 N0 M0.  INTERVAL HISTORY:   Patient returns to clinic today for further evaluation and discussion of her PET scan results. Her only complaint today is of worsening bilateral peripheral edema. She otherwise feels well and is asymptomatic. She has no neurologic complaints. She denies any recent fevers. She denies any chest pain, cough, hemoptysis, or shortness of breath. She has no abdominal pain. She denies any nausea, vomiting, constipation, or diarrhea. She has no melena or hematochezia. She has no urinary complaints. Patient otherwise feels well and offers no further specific complaints. She has no chest pain. Patient offers no further specific complaints today.  REVIEW OF SYSTEMS:   Review of Systems  Constitutional: Negative for fever, weight loss and malaise/fatigue.  HENT: Negative.   Eyes: Negative.   Respiratory: Negative for cough and shortness of breath.   Cardiovascular: Positive for leg swelling. Negative for chest pain.  Gastrointestinal: Negative for nausea, vomiting, abdominal pain, diarrhea, constipation, blood in stool and melena.  Genitourinary: Negative.   Musculoskeletal: Negative.   Skin: Negative.   Neurological: Negative.  Negative for weakness.  Endo/Heme/Allergies: Does not bruise/bleed easily.  Psychiatric/Behavioral: The patient is not nervous/anxious.     As per HPI. Otherwise, a complete review of systems is negatve.   PAST MEDICAL HISTORY: Past Medical History  Diagnosis Date  . Cigarette smoker   . Hypertension   . Arm pain   . Rectal bleeding   . COPD (chronic  obstructive pulmonary disease) ([place])   . Hemorrhoids   . CAD in native artery   . Pulmonary emphysema ([place])   . Dyslipidemia   . Dermatophytoses   . DDD (degenerative disc disease), lumbar        . Aortic atherosclerosis ([place])   . H/O blood clots     left leg, veins stripped  . Shortness of breath dyspnea   . Smokers' cough ([place])   . Myocardial infarction [facility])     "mild" - age 51  . Congenital absence of one kidney     born with only right kidney  . Vertigo   . Wears dentures     has full upper and lower - doesn't wear  . Asthma   . Claustrophobia   . Last menstrual period (LMP) > 10 days ago 1994    PAST SURGICAL HISTORY: Past Surgical History  Procedure Laterality Date  . Vein ligation and stripping    . Ovarian cyst removal Left   . Appendectomy    . Lumbar disc surgery  1997  . Breast biopsy    . Exploratory laparotomy    . Lesion excision  11/03/15    Anal - Dr Dahlia Byes, [facility]  . Colonoscopy with propofol N/A 11/06/2015    Procedure: COLONOSCOPY WITH PROPOFOL;  Surgeon: Lucilla Lame, MD;  Location: [place];  Service: Endoscopy;  Laterality: N/A;  LEAVE PT EARLY  . Polypectomy  11/06/2015    Procedure: POLYPECTOMY;  Surgeon: Lucilla Lame, MD;  Location: [place];  Service: Endoscopy;;  . Portacath placement N/A 12/10/2015    Procedure: INSERTION PORT-A-CATH;  Surgeon: Jules Husbands, MD;  Location: [facility];  Service: General;  Laterality: N/A;    FAMILY HISTORY Family History  Problem Relation Age of Onset  . Cancer Mother     lymphoma  . Heart disease Mother   . Cancer Maternal Grandmother     breast    GYNECOLOGIC HISTORY:  No LMP recorded. Patient is postmenopausal.     ADVANCED DIRECTIVES:    HEALTH MAINTENANCE: Social History  Substance Use Topics  . Smoking status: Current Every Day Smoker -- 1.00 packs/day for 57 years    Types: Cigarettes  . Smokeless tobacco: Never Used     Comment: not quite ready  . Alcohol Use: No      Allergies  Allergen Reactions  . Aspirin Nausea And Vomiting  . Codeine Nausea And Vomiting  . Morphine And Related Nausea And Vomiting    Current Outpatient Prescriptions  Medication Sig Dispense Refill  . acetaminophen (TYLENOL) 500 MG tablet Take 1,000 mg by mouth every 6 (six) hours as needed.    Marland Kitchen albuterol (PROVENTIL HFA;VENTOLIN HFA) 108 (90 Base) MCG/ACT inhaler Inhale 2 puffs into the lungs every 6 (six) hours as needed for wheezing or shortness of breath. 3 Inhaler 1  . ALPRAZolam (XANAX) 0.25 MG tablet Take 1 tablet (0.25 mg total) by mouth 2 (two) times daily as needed for anxiety. 60 tablet 0  . amLODipine (NORVASC) 5 MG tablet TAKE 1 TABLET EVERY DAY 90 tablet 1  . atorvastatin (LIPITOR) 20 MG tablet TAKE 1 TABLET AT BEDTIME 90 tablet 1  . lidocaine-prilocaine (EMLA) cream Apply 1 application topically as needed. 30 g 3  . loperamide (IMODIUM) 2 MG capsule Take 1 capsule (2 mg total) by mouth every 6 (six) hours as needed for diarrhea or loose stools. 30 capsule 0  . ondansetron (ZOFRAN) 8 MG tablet Take 1 tablet (8 mg total) by mouth every 8 (eight) hours as needed for nausea or vomiting. 30 tablet 0  . OXYGEN Inhale 2 L/min into the lungs continuous.    . prochlorperazine (COMPAZINE) 10 MG tablet Take 1 tablet (10 mg total) by mouth every 6 (six) hours as needed for nausea or vomiting. 30 tablet 1  . silver sulfADIAZINE (SILVADENE) 1 % cream Apply 1 application topically 2 (two) times daily. (Patient not taking: Reported on 03/30/2016) 50 g 2  . traMADol-acetaminophen (ULTRACET) 37.5-325 MG tablet Take 1-2 tablets by mouth every 4 (four) hours as needed for moderate pain. 30 tablet 0  . Umeclidinium-Vilanterol (ANORO ELLIPTA) 62.5-25 MCG/INH AEPB Inhale 1 puff into the lungs daily. 180 each 1   No current facility-administered medications for this visit.    OBJECTIVE: There were no vitals taken for this visit.   There is no weight on file to calculate BMI.    ECOG  FS:0 - Asymptomatic  General: Well-developed, well-nourished, no acute distress. Eyes: Pink conjunctiva, anicteric sclera. Lungs: Clear to auscultation bilaterally. Heart: Regular rate and rhythm. No rubs, murmurs, or gallops. Abdomen: Soft, nontender, nondistended. No organomegaly noted, normoactive bowel sounds. Musculoskeletal: mild nonpitting edema bilateral feet.                 Neuro: Alert, answering all questions appropriately. Cranial nerves grossly intact. Skin: No rashes or petechiae noted. Psych: Normal affect.   LAB RESULTS:  Appointment on 03/30/2016  Component Date Value Ref Range Status  . WBC 03/30/2016 7.5  3.6 - 11.0 K/uL Final  . RBC 03/30/2016 3.53* 3.80 - 5.20 MIL/uL Final  . Hemoglobin 03/30/2016 11.5* 12.0 -  16.0 g/dL Final  . HCT 03/30/2016 33.7* 35.0 - 47.0 % Final  . MCV 03/30/2016 95.5  80.0 - 100.0 fL Final  . MCH 03/30/2016 32.6  26.0 - 34.0 pg Final  . MCHC 03/30/2016 34.1  32.0 - 36.0 g/dL Final  . RDW 03/30/2016 18.1* 11.5 - 14.5 % Final  . Platelets 03/30/2016 223  150 - 440 K/uL Final  . Neutrophils Relative % 03/30/2016 64   Final  . Neutro Abs 03/30/2016 4.8  1.4 - 6.5 K/uL Final  . Lymphocytes Relative 03/30/2016 16   Final  . Lymphs Abs 03/30/2016 1.2  1.0 - 3.6 K/uL Final  . Monocytes Relative 03/30/2016 11   Final  . Monocytes Absolute 03/30/2016 0.8  0.2 - 0.9 K/uL Final  . Eosinophils Relative 03/30/2016 8   Final  . Eosinophils Absolute 03/30/2016 0.6  0 - 0.7 K/uL Final  . Basophils Relative 03/30/2016 1   Final  . Basophils Absolute 03/30/2016 0.1  0 - 0.1 K/uL Final  . Sodium 03/30/2016 136  135 - 145 mmol/L Final  . Potassium 03/30/2016 3.7  3.5 - 5.1 mmol/L Final  . Chloride 03/30/2016 100* 101 - 111 mmol/L Final  . CO2 03/30/2016 30  22 - 32 mmol/L Final  . Glucose, Bld 03/30/2016 98  65 - 99 mg/dL Final  . BUN 03/30/2016 9  6 - 20 mg/dL Final  . Creatinine, Ser 03/30/2016 0.68  0.44 - 1.00 mg/dL Final  . Calcium 03/30/2016  9.2  8.9 - 10.3 mg/dL Final  . Total Protein 03/30/2016 6.5  6.5 - 8.1 g/dL Final  . Albumin 03/30/2016 3.0* 3.5 - 5.0 g/dL Final  . AST 03/30/2016 15  15 - 41 U/L Final  . ALT 03/30/2016 9* 14 - 54 U/L Final  . Alkaline Phosphatase 03/30/2016 54  38 - 126 U/L Final  . Total Bilirubin 03/30/2016 0.3  0.3 - 1.2 mg/dL Final  . GFR calc non Af Amer 03/30/2016 >60  >60 mL/min Final  . GFR calc Af Amer 03/30/2016 >60  >60 mL/min Final   Comment: (NOTE) The eGFR has been calculated using the CKD EPI equation. This calculation has not been validated in all clinical situations. eGFR's persistently <60 mL/min signify possible Chronic Kidney Disease.   . Anion gap 03/30/2016 6  5 - 15 Final    STUDIES: No results found.  ASSESSMENT:  Squamous cell carcinoma of the anus, stage II, T2 N0 M0.  PLAN:   1. Squamous cell carcinoma of the anus: PET scan results from March 25, 2016 reviewed independently and reported resolution of PET positive anal lesion suggesting an excellent response to treatment. No further intervention is needed at this time. Per NCCN guidelines, patient will require an anoscope every 6-12 months for the next 2 years. Will also do abdominal and pelvic CT with contrast annually for the next 3 years. Return to clinic in 3 months with repeat laboratory work and further evaluation.Continued follow-up with GI as scheduled. 2. Bilateral peripheral edema: Unlikely related to patient's malignancy or treatment. Continued evaluation per primary care. 3. Right upper lobe opacity: PET scan suggests possible infectious cause. Patient is currently asymptomatic, monitor.  Patient expressed understanding and was in agreement with this plan. She also understands that She can call clinic at any time with any questions, concerns, or complaints.    Squamous cell carcinoma of anus (HCC)   Staging form: Anus, AJCC 7th Edition     Clinical stage from 11/28/2015: Stage II (T2,  N0, M0) - Signed by  Lloyd Huger, MD on 11/28/2015   Lloyd Huger, MD   03/30/2016 11:42 PM

## 2016-03-31 ENCOUNTER — Other Ambulatory Visit: Payer: Self-pay | Admitting: Oncology

## 2016-04-01 ENCOUNTER — Ambulatory Visit (INDEPENDENT_AMBULATORY_CARE_PROVIDER_SITE_OTHER): Payer: Medicare PPO | Admitting: Internal Medicine

## 2016-04-01 ENCOUNTER — Encounter: Payer: Self-pay | Admitting: Internal Medicine

## 2016-04-01 VITALS — BP 112/68 | HR 74 | Resp 16 | Ht 62.0 in | Wt 168.0 lb

## 2016-04-01 DIAGNOSIS — M7071 Other bursitis of hip, right hip: Secondary | ICD-10-CM

## 2016-04-01 DIAGNOSIS — J431 Panlobular emphysema: Secondary | ICD-10-CM | POA: Diagnosis not present

## 2016-04-01 DIAGNOSIS — R600 Localized edema: Secondary | ICD-10-CM | POA: Diagnosis not present

## 2016-04-01 DIAGNOSIS — I10 Essential (primary) hypertension: Secondary | ICD-10-CM | POA: Diagnosis not present

## 2016-04-01 DIAGNOSIS — C210 Malignant neoplasm of anus, unspecified: Secondary | ICD-10-CM

## 2016-04-01 MED ORDER — LEVOFLOXACIN 500 MG PO TABS: 500.0000 mg | ORAL_TABLET | Freq: Every day | ORAL | Status: DC

## 2016-04-01 MED ORDER — ONDANSETRON HCL 8 MG PO TABS: 8.0000 mg | ORAL_TABLET | Freq: Three times a day (TID) | ORAL | Status: DC | PRN

## 2016-04-01 MED ORDER — FUROSEMIDE 20 MG PO TABS: 20.0000 mg | ORAL_TABLET | Freq: Every day | ORAL | Status: DC

## 2016-04-01 NOTE — Progress Notes (Signed)
Date:  04/01/2016   Name:  Carol Gay   DOB:  04-May-1946   MRN:  [id number]   Chief Complaint: Foot Swelling and Cough Patient has had intermittent lower extremity edema for some time. However since her radiation and chemotherapy for rectal cancer she's had increasing swelling. This is been worse over the past week and does not resolve with elevation. It is equal in both legs and extends to the knees. She is somewhat uncomfortable but not in severe pain.  She is unable to sleep on one pillow due to COPD and this is unchanged.  COPD - she is on oxygen therapy around the clock but does not have it with her today. Apparently she was unable to get an oxygen tank that was light enough for her to transport. She reports oxygen saturations around 93-95% at home on oxygen. She has a chronic cough which is nonproductive. She recently had a follow-up PET scan which showed a 3 x 4 cm wedge shaped opacity in the right upper lobe. Radiology favored infection versus metastatic.  She was not treated with any antibiotics for this problem. She does report that she quit smoking yesterday and is using a nicotine patch. Hip pain - over the past few weeks she's noticed pain in her right lateral hip. It's uncomfortable for her to sleep on that side. She denies any falls or bruising.  Review of Systems  Constitutional: Positive for fatigue. Negative for fever, chills and unexpected weight change.  Respiratory: Positive for cough, shortness of breath and wheezing. Negative for choking and chest tightness.   Cardiovascular: Positive for leg swelling. Negative for chest pain and palpitations.  Gastrointestinal: Negative for abdominal pain.  Genitourinary: Negative for dysuria.  Musculoskeletal: Positive for myalgias and arthralgias.  Neurological: Negative for dizziness and syncope.  Psychiatric/Behavioral: Negative for sleep disturbance. The patient is nervous/anxious.     Patient Active Problem List   Diagnosis Date Noted  . Weakness 02/08/2016  . 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 Blood in stool   . Benign neoplasm of transverse colon   . Benign neoplasm of rectosigmoid junction   . Fistula of intestine, excluding rectum and anus   . Pulmonary emphysema ([place]) 10/24/2015  . Encounter for long-term (current) use of medications 02/28/2015  . Aortic atherosclerosis ([place]) 12/30/2014  . CAD in native artery 12/30/2014  . DDD (degenerative disc disease), lumbar 12/30/2014  . Dyslipidemia 12/30/2014  . Essential (primary) hypertension 12/30/2014  . Dermatophytoses 12/30/2014  . Compulsive tobacco user syndrome 12/30/2014    Prior to Admission medications   Medication Sig Start Date End Date Taking? Authorizing Provider  acetaminophen (TYLENOL) 500 MG tablet Take 1,000 mg by mouth every 6 (six) hours as needed.   Yes Historical Provider, MD  albuterol (PROVENTIL HFA;VENTOLIN HFA) 108 (90 Base) MCG/ACT inhaler Inhale 2 puffs into the lungs every 6 (six) hours as needed for wheezing or shortness of breath. 09/19/15  Yes Glean Hess, MD  ALPRAZolam Duanne Moron) 0.25 MG tablet Take 1 tablet (0.25 mg total) by mouth 2 (two) times daily as needed for anxiety. 02/19/16  Yes Lloyd Huger, MD  amLODipine (NORVASC) 5 MG tablet TAKE 1 TABLET EVERY DAY 03/16/16  Yes Glean Hess, MD  atorvastatin (LIPITOR) 20 MG tablet TAKE 1 TABLET AT BEDTIME 03/16/16  Yes Glean Hess, MD  lidocaine-prilocaine (EMLA) cream Apply 1 application topically as needed. 12/10/15  Yes Diego F Pabon, MD  loperamide (IMODIUM) 2 MG  capsule Take 1 capsule (2 mg total) by mouth every 6 (six) hours as needed for diarrhea or loose stools. 02/11/16  Yes [name], MD  ondansetron (ZOFRAN) 8 MG tablet Take 1 tablet (8 mg total) by mouth every 8 (eight) hours as needed for nausea or vomiting. 03/22/16  Yes Lloyd Huger, MD  OXYGEN Inhale 2 L/min into the lungs continuous.   Yes Historical Provider, MD    prochlorperazine (COMPAZINE) 10 MG tablet Take 1 tablet (10 mg total) by mouth every 6 (six) hours as needed for nausea or vomiting. 01/08/16  Yes Lloyd Huger, MD  silver sulfADIAZINE (SILVADENE) 1 % cream Apply 1 application topically 2 (two) times daily. 02/03/16  Yes Noreene Filbert, MD  traMADol-acetaminophen (ULTRACET) 37.5-325 MG tablet Take 1-2 tablets by mouth every 4 (four) hours as needed for moderate pain. 03/22/16  Yes Lloyd Huger, MD  Umeclidinium-Vilanterol (ANORO ELLIPTA) 62.5-25 MCG/INH AEPB Inhale 1 puff into the lungs daily. 09/23/15  Yes Glean Hess, MD    Allergies  Allergen Reactions  . Aspirin Nausea And Vomiting  . Codeine Nausea And Vomiting  . Morphine And Related Nausea And Vomiting    Past Surgical History  Procedure Laterality Date  . Vein ligation and stripping    . Ovarian cyst removal Left   . Appendectomy    . Lumbar disc surgery  1997  . Breast biopsy    . Exploratory laparotomy    . Lesion excision  11/03/15    Anal - Dr Dahlia Byes, [facility]  . Colonoscopy with propofol N/A 11/06/2015    Procedure: COLONOSCOPY WITH PROPOFOL;  Surgeon: Lucilla Lame, MD;  Location: [place];  Service: Endoscopy;  Laterality: N/A;  LEAVE PT EARLY  . Polypectomy  11/06/2015    Procedure: POLYPECTOMY;  Surgeon: Lucilla Lame, MD;  Location: [place];  Service: Endoscopy;;  . Portacath placement N/A 12/10/2015    Procedure: INSERTION PORT-A-CATH;  Surgeon: Jules Husbands, MD;  Location: [facility];  Service: General;  Laterality: N/A;    Social History  Substance Use Topics  . Smoking status: Former Smoker -- 1.00 packs/day for 57 years    Types: Cigarettes    Quit date: 03/30/2016  . Smokeless tobacco: Never Used     Comment: not quite ready  . Alcohol Use: No     Medication list has been reviewed and updated.   Physical Exam  Constitutional: She is oriented to person, place, and time. She appears well-developed. No distress.  HENT:   Head: Normocephalic and atraumatic.  Neck: Normal range of motion. Neck supple.  Cardiovascular: Normal rate, regular rhythm and normal heart sounds.   Pulmonary/Chest: Effort normal. No respiratory distress. She has decreased breath sounds. She has no wheezes. She has no rales.  Abdominal: Soft. Normal appearance. There is no tenderness.  Musculoskeletal: She exhibits edema (pitting edema to knees bilaterally).  Tender over right hip bursa  Neurological: She is alert and oriented to person, place, and time.  Skin: Skin is warm and dry. No rash noted. There is erythema.  Psychiatric: She has a normal mood and affect. Her behavior is normal. Thought content normal.  Nursing note and vitals reviewed.   BP 112/68 mmHg  Pulse 74  Resp 16  Ht '5\' 2"'$  (1.575 m)  Wt 168 lb (76.204 kg)  BMI 30.72 kg/m2  SpO2 92%  Assessment and Plan: 1. Localized edema Reduce dose of amlodipine and add lasix Suspect edema due to XRT,  venous insufficiency and low serum albumin levels - furosemide (LASIX) 20 MG tablet; Take 1 tablet (20 mg total) by mouth daily.  Dispense: 30 tablet; Refill: 3  2. Panlobular emphysema ([place]) With PET showing area suspicious for infection Should have follow up scan in 3 months with Oncology Follow up here sooner if needed - levofloxacin (LEVAQUIN) 500 MG tablet; Take 1 tablet (500 mg total) by mouth daily.  Dispense: 7 tablet; Refill: 0  3. Essential (primary) hypertension Slightly low - reduce dose of amlodipine  4. Bursitis of hip, right Use heat, take tylenol, or topical rubs as needed  5. Squamous cell carcinoma of anus (HCC) Recent PET scan showed resolution of the PET positive anal lesion  Halina Maidens, MD [facility]  04/01/2016

## 2016-04-01 NOTE — Patient Instructions (Signed)
Cut Amlodipine 5 mg in half and take 2.5 mg per day.

## 2016-04-06 ENCOUNTER — Inpatient Hospital Stay: Payer: Medicare PPO

## 2016-04-06 DIAGNOSIS — Z95828 Presence of other vascular implants and grafts: Secondary | ICD-10-CM

## 2016-04-06 DIAGNOSIS — C210 Malignant neoplasm of anus, unspecified: Secondary | ICD-10-CM | POA: Diagnosis not present

## 2016-04-06 MED ORDER — HEPARIN SOD (PORK) LOCK FLUSH 100 UNIT/ML IV SOLN
500.0000 [IU] | Freq: Once | INTRAVENOUS | Status: AC
  Administered 2016-04-06: 500 [IU] via INTRAVENOUS

## 2016-04-06 MED ORDER — SODIUM CHLORIDE 0.9% FLUSH
10.0000 mL | INTRAVENOUS | Status: DC | PRN
  Administered 2016-04-06: 10 mL via INTRAVENOUS
  Filled 2016-04-06: qty 10

## 2016-04-08 ENCOUNTER — Telehealth: Payer: Self-pay

## 2016-04-08 NOTE — Telephone Encounter (Signed)
She was prescribed Lasix last Friday. She is still having feet and leg swelling. Wants to know what to do.

## 2016-04-08 NOTE — Telephone Encounter (Signed)
Niot sure if you called this one but message below may need to be given I do not want to call if you have.

## 2016-04-08 NOTE — Telephone Encounter (Signed)
She is going to have some swelling - it is not dangerous.  Take the lasix as prescribed just to keep the swelling from becoming severe.

## 2016-04-08 NOTE — Telephone Encounter (Signed)
Patient notified

## 2016-04-14 ENCOUNTER — Ambulatory Visit: Payer: Medicare PPO

## 2016-04-26 ENCOUNTER — Telehealth: Payer: Self-pay

## 2016-04-26 NOTE — Telephone Encounter (Signed)
  Oncology Nurse Navigator Documentation  Navigator Location: [facility] (04/26/16 0900) Navigator Encounter Type: Telephone (04/26/16 0900) Telephone: Incoming Call (04/26/16 0900)             Barriers/Navigation Needs: Coordination of Care (04/26/16 0900)   Interventions: Coordination of Care (04/26/16 0900)   Coordination of Care: Appts (04/26/16 0900)                  Time Spent with Patient: 15 (04/26/16 0900)   Voicemail left to return call. Needs colposcopy arranged in [place] clinic

## 2016-04-29 ENCOUNTER — Telehealth: Payer: Self-pay

## 2016-04-29 NOTE — Telephone Encounter (Signed)
  Oncology Nurse Navigator Documentation  Navigator Location: [facility] (04/29/16 1400) Navigator Encounter Type: Telephone (04/29/16 1400) Telephone: Carol Gay Call;Appt Confirmation/Clarification (04/29/16 1400)             Barriers/Navigation Needs: Coordination of Care (04/29/16 1400)   Interventions: Coordination of Care (04/29/16 1400)   Coordination of Care: Appts (04/29/16 1400)                  Time Spent with Patient: 15 (04/29/16 1400)   Needs Colposcopy arranged for HPV on pap. No voicemail on phone. Second attempy. Will arrange appt for Gyn clinic and mail to patient

## 2016-05-05 ENCOUNTER — Encounter: Payer: Self-pay | Admitting: Internal Medicine

## 2016-05-05 ENCOUNTER — Ambulatory Visit (INDEPENDENT_AMBULATORY_CARE_PROVIDER_SITE_OTHER): Payer: Medicare PPO | Admitting: Internal Medicine

## 2016-05-05 VITALS — BP 100/46 | HR 94 | Resp 16 | Ht 62.0 in | Wt 147.0 lb

## 2016-05-05 DIAGNOSIS — I2510 Atherosclerotic heart disease of native coronary artery without angina pectoris: Secondary | ICD-10-CM | POA: Diagnosis not present

## 2016-05-05 DIAGNOSIS — C210 Malignant neoplasm of anus, unspecified: Secondary | ICD-10-CM | POA: Diagnosis not present

## 2016-05-05 DIAGNOSIS — I10 Essential (primary) hypertension: Secondary | ICD-10-CM | POA: Diagnosis not present

## 2016-05-05 DIAGNOSIS — F17200 Nicotine dependence, unspecified, uncomplicated: Secondary | ICD-10-CM | POA: Diagnosis not present

## 2016-05-05 DIAGNOSIS — R600 Localized edema: Secondary | ICD-10-CM | POA: Insufficient documentation

## 2016-05-05 DIAGNOSIS — R609 Edema, unspecified: Secondary | ICD-10-CM

## 2016-05-05 MED ORDER — ONDANSETRON HCL 8 MG PO TABS: 8.0000 mg | ORAL_TABLET | Freq: Three times a day (TID) | ORAL | 5 refills | Status: DC | PRN

## 2016-05-05 NOTE — Progress Notes (Signed)
Date:  05/05/2016   Name:  Carol Gay   DOB:  1945/10/23   MRN:  [id number]   Chief Complaint: Edema (4 week fu) Hypertension  This is a chronic problem. The current episode started more than 1 year ago. The problem is unchanged. The problem is controlled. Associated symptoms include shortness of breath. Pertinent negatives include no chest pain or palpitations.   Edema - much improved with lasix.  Occasional muscle cramps.  Rectal cancer - completed treatment.  Oncology believes she is cured.  CAD - no chest pains.  Wears Oxygen at home.  Reminded to wear all the time.  Review of Systems  Constitutional: Negative for chills, fatigue and fever.  Respiratory: Positive for cough, shortness of breath and wheezing. Negative for chest tightness.   Cardiovascular: Positive for leg swelling. Negative for chest pain and palpitations.  Gastrointestinal: Positive for abdominal distention and diarrhea. Negative for abdominal pain.  Endocrine: Negative for polydipsia and polyuria.  Genitourinary: Negative for dysuria.  Musculoskeletal: Negative for arthralgias.  Skin: Negative for rash.  Allergic/Immunologic: Negative for environmental allergies.  Neurological: Negative for dizziness.  Hematological: Negative for adenopathy.  Psychiatric/Behavioral: Negative for sleep disturbance. The patient is not nervous/anxious.     Patient Active Problem List   Diagnosis Date Noted  . Edema extremities 05/05/2016  . Weakness 02/08/2016  . 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 Blood in stool   . Benign neoplasm of transverse colon   . Benign neoplasm of rectosigmoid junction   . Fistula of intestine, excluding rectum and anus   . Pulmonary emphysema ([place]) 10/24/2015  . Encounter for long-term (current) use of medications 02/28/2015  . Aortic atherosclerosis ([place]) 12/30/2014  . CAD in native artery 12/30/2014  . DDD (degenerative disc disease), lumbar 12/30/2014  .  Dyslipidemia 12/30/2014  . Essential (primary) hypertension 12/30/2014  . Dermatophytoses 12/30/2014  . Compulsive tobacco user syndrome 12/30/2014    Prior to Admission medications   Medication Sig Start Date End Date Taking? Authorizing Provider  acetaminophen (TYLENOL) 500 MG tablet Take 1,000 mg by mouth every 6 (six) hours as needed.   Yes Historical Provider, MD  albuterol (PROVENTIL HFA;VENTOLIN HFA) 108 (90 Base) MCG/ACT inhaler Inhale 2 puffs into the lungs every 6 (six) hours as needed for wheezing or shortness of breath. 09/19/15  Yes Glean Hess, MD  ALPRAZolam Duanne Moron) 0.25 MG tablet Take 1 tablet (0.25 mg total) by mouth 2 (two) times daily as needed for anxiety. 02/19/16  Yes Lloyd Huger, MD  amLODipine (NORVASC) 5 MG tablet TAKE 1 TABLET EVERY DAY 03/16/16  Yes Glean Hess, MD  atorvastatin (LIPITOR) 20 MG tablet TAKE 1 TABLET AT BEDTIME 03/16/16  Yes Glean Hess, MD  furosemide (LASIX) 20 MG tablet Take 1 tablet (20 mg total) by mouth daily. 04/01/16  Yes Glean Hess, MD  lidocaine-prilocaine (EMLA) cream Apply 1 application topically as needed. 12/10/15  Yes Diego F Pabon, MD  loperamide (IMODIUM) 2 MG capsule Take 1 capsule (2 mg total) by mouth every 6 (six) hours as needed for diarrhea or loose stools. 02/11/16  Yes [name], MD  ondansetron (ZOFRAN) 8 MG tablet Take 1 tablet (8 mg total) by mouth every 8 (eight) hours as needed for nausea or vomiting. 04/01/16  Yes Glean Hess, MD  OXYGEN Inhale 2 L/min into the lungs continuous.   Yes Historical Provider, MD  prochlorperazine (COMPAZINE) 10 MG tablet Take 1 tablet (10  mg total) by mouth every 6 (six) hours as needed for nausea or vomiting. 01/08/16  Yes Lloyd Huger, MD  silver sulfADIAZINE (SILVADENE) 1 % cream Apply 1 application topically 2 (two) times daily. 02/03/16  Yes Noreene Filbert, MD  traMADol-acetaminophen (ULTRACET) 37.5-325 MG tablet Take 1-2 tablets by mouth every 4 (four)  hours as needed for moderate pain. 03/22/16  Yes Lloyd Huger, MD  Umeclidinium-Vilanterol (ANORO ELLIPTA) 62.5-25 MCG/INH AEPB Inhale 1 puff into the lungs daily. 09/23/15  Yes Glean Hess, MD    Allergies  Allergen Reactions  . Aspirin Nausea And Vomiting  . Codeine Nausea And Vomiting  . Morphine And Related Nausea And Vomiting    Past Surgical History:  Procedure Laterality Date  . APPENDECTOMY    . BREAST BIOPSY    . COLONOSCOPY WITH PROPOFOL N/A 11/06/2015   Procedure: COLONOSCOPY WITH PROPOFOL;  Surgeon: Lucilla Lame, MD;  Location: [facility];  Service: Endoscopy;  Laterality: N/A;  LEAVE PT EARLY  . EXPLORATORY LAPAROTOMY    . LESION EXCISION  11/03/15   Anal - Dr Dahlia Byes, [facility]  . [place] SURGERY  1997  . OVARIAN CYST REMOVAL Left   . POLYPECTOMY  11/06/2015   Procedure: POLYPECTOMY;  Surgeon: Lucilla Lame, MD;  Location: [facility];  Service: Endoscopy;;  . PORTACATH PLACEMENT N/A 12/10/2015   Procedure: INSERTION PORT-A-CATH;  Surgeon: Jules Husbands, MD;  Location: [facility];  Service: General;  Laterality: N/A;  . VEIN LIGATION AND STRIPPING      Social History  Substance Use Topics  . Smoking status: Former Smoker    Packs/day: 1.00    Years: 57.00    Types: Cigarettes    Quit date: 03/30/2016  . Smokeless tobacco: Never Used     Comment: not quite ready  . Alcohol use No     Medication list has been reviewed and updated.   Physical Exam  Constitutional: She is oriented to person, place, and time. She appears well-developed. No distress.  HENT:  Head: Normocephalic and atraumatic.  Cardiovascular: Normal rate, regular rhythm and normal heart sounds.   Pulmonary/Chest: Effort normal. No respiratory distress. She has wheezes. She has no rales. She exhibits no tenderness.  Abdominal: Soft.  Musculoskeletal: Normal range of motion. She exhibits edema. She exhibits no tenderness or deformity.  Neurological: She is alert and oriented  to person, place, and time.  Skin: Skin is warm and dry. No rash noted.  Psychiatric: She has a normal mood and affect. Her behavior is normal. Thought content normal.  Nursing note and vitals reviewed.   BP (!) 100/46   Pulse 94   Resp 16   Ht '5\' 2"'$  (1.575 m)   Wt 147 lb (66.7 kg)   SpO2 (!) 76% Comment: Gave 2 L O2  BMI 26.89 kg/m   Assessment and Plan: 1. Essential (primary) hypertension Stop amlodipine  2. Edema extremities Continue lasix Check electrolytes  3. CAD in native artery stable Continue statin therapy  4. Squamous cell carcinoma of anus (HCC) In remission  5. Compulsive tobacco user syndrome Working hard to quit completely - congratulated on efforts Continue O2 around the clock   Halina Maidens, MD [facility]  05/05/2016

## 2016-05-06 LAB — BASIC METABOLIC PANEL
BUN / CREAT RATIO: 20 (ref 12–28)
BUN: 16 mg/dL (ref 8–27)
CALCIUM: 10 mg/dL (ref 8.7–10.3)
CHLORIDE: 95 mmol/L — AB (ref 96–106)
CO2: 21 mmol/L (ref 18–29)
Creatinine, Ser: 0.81 mg/dL (ref 0.57–1.00)
GFR calc Af Amer: 85 mL/min/{1.73_m2} (ref >59)
GFR calc non Af Amer: 74 mL/min/{1.73_m2} (ref >59)
GLUCOSE: 94 mg/dL (ref 65–99)
Potassium: 4.2 mmol/L (ref 3.5–5.2)
SODIUM: 138 mmol/L (ref 134–144)

## 2016-05-07 ENCOUNTER — Other Ambulatory Visit: Payer: Self-pay | Admitting: Oncology

## 2016-05-18 ENCOUNTER — Inpatient Hospital Stay: Payer: Medicare PPO

## 2016-05-24 ENCOUNTER — Other Ambulatory Visit: Payer: Self-pay | Admitting: Hematology and Oncology

## 2016-05-24 DIAGNOSIS — C210 Malignant neoplasm of anus, unspecified: Secondary | ICD-10-CM

## 2016-05-24 MED ORDER — TRAMADOL-ACETAMINOPHEN 37.5-325 MG PO TABS: 1.0000 | ORAL_TABLET | ORAL | 0 refills | Status: DC | PRN

## 2016-05-26 ENCOUNTER — Inpatient Hospital Stay: Payer: Medicare PPO | Attending: Oncology

## 2016-05-26 ENCOUNTER — Inpatient Hospital Stay: Payer: Medicare PPO

## 2016-05-26 DIAGNOSIS — C20 Malignant neoplasm of rectum: Secondary | ICD-10-CM | POA: Diagnosis not present

## 2016-05-26 DIAGNOSIS — C801 Malignant (primary) neoplasm, unspecified: Secondary | ICD-10-CM

## 2016-05-26 DIAGNOSIS — Z452 Encounter for adjustment and management of vascular access device: Secondary | ICD-10-CM | POA: Diagnosis not present

## 2016-05-26 MED ORDER — HEPARIN SOD (PORK) LOCK FLUSH 100 UNIT/ML IV SOLN
500.0000 [IU] | Freq: Once | INTRAVENOUS | Status: AC
  Administered 2016-05-26: 500 [IU] via INTRAVENOUS

## 2016-05-26 MED ORDER — HEPARIN SOD (PORK) LOCK FLUSH 100 UNIT/ML IV SOLN
INTRAVENOUS | Status: AC
  Filled 2016-05-26: qty 5

## 2016-05-26 MED ORDER — SODIUM CHLORIDE 0.9% FLUSH
10.0000 mL | Freq: Once | INTRAVENOUS | Status: AC
  Administered 2016-05-26: 10 mL via INTRAVENOUS
  Filled 2016-05-26: qty 10

## 2016-05-31 ENCOUNTER — Other Ambulatory Visit: Payer: Self-pay | Admitting: Internal Medicine

## 2016-06-03 ENCOUNTER — Encounter: Payer: Self-pay | Admitting: Surgery

## 2016-06-03 ENCOUNTER — Telehealth: Payer: Self-pay | Admitting: *Deleted

## 2016-06-03 ENCOUNTER — Ambulatory Visit (INDEPENDENT_AMBULATORY_CARE_PROVIDER_SITE_OTHER): Payer: Medicare PPO | Admitting: Surgery

## 2016-06-03 VITALS — BP 129/69 | HR 98 | Temp 97.7°F | Ht 62.0 in | Wt 143.0 lb

## 2016-06-03 DIAGNOSIS — C210 Malignant neoplasm of anus, unspecified: Secondary | ICD-10-CM

## 2016-06-03 MED ORDER — ALPRAZOLAM 0.25 MG PO TABS: 0.2500 mg | ORAL_TABLET | Freq: Two times a day (BID) | ORAL | 0 refills | Status: DC | PRN

## 2016-06-03 NOTE — Progress Notes (Signed)
Outpatient Surgical Follow Up  06/03/2016  Carol Gay is an 70 y.o. female.   Chief Complaint  Patient presents with  . Follow-up    Anal Cancer    HPI: Carol Gay is following up after she was diagnosed with anal carcinoma unfair rate 2017 ready to complete her chemotherapy and radiation therapy with good result and she is now on remission. She is due for an anoscopy and exam under anesthesia to follow the progression of her disease clinically. She reports that she still has some intermittent anorectal discomfort that was caused by her radiation therapy. She does feel weak and has decreased appetite as well as significant weight loss. She has significant COPD but has stopped smoking. She now is wears oxygen  Past Medical History:  Diagnosis Date  . Aortic atherosclerosis ([place])   . Arm pain   . Asthma   . CAD in native artery   . Cigarette smoker   . Claustrophobia   . 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 COPD (chronic obstructive pulmonary disease) ([place])   . DDD (degenerative disc disease), lumbar       . Dermatophytoses   . Dyslipidemia   . H/O blood clots    left leg, veins stripped  . Hemorrhoids   . Hypertension   . Last menstrual period (LMP) > 10 days ago 1994  . Myocardial infarction [facility])    "mild" - age 5  . Pulmonary emphysema ([place])   . Rectal bleeding   . Shortness of breath dyspnea   . Smokers' cough ([place])   . Vertigo   . Wears dentures    has full upper and lower - doesn't wear    Past Surgical History:  Procedure Laterality Date  . APPENDECTOMY    . BREAST BIOPSY    . COLONOSCOPY WITH PROPOFOL N/A 11/06/2015   Procedure: COLONOSCOPY WITH PROPOFOL;  Surgeon: Lucilla Lame, MD;  Location: [place];  Service: Endoscopy;  Laterality: N/A;  LEAVE PT EARLY  . EXPLORATORY LAPAROTOMY    . LESION EXCISION  11/03/15   Anal - Dr Dahlia Byes, [facility]  . [place] SURGERY  1997  . OVARIAN CYST REMOVAL Left   . POLYPECTOMY   11/06/2015   Procedure: POLYPECTOMY;  Surgeon: Lucilla Lame, MD;  Location: [facility];  Service: Endoscopy;;  . PORTACATH PLACEMENT N/A 12/10/2015   Procedure: INSERTION PORT-A-CATH;  Surgeon: Jules Husbands, MD;  Location: [facility];  Service: General;  Laterality: N/A;  . VEIN LIGATION AND STRIPPING      Family History  Problem Relation Age of Onset  . Cancer Mother     lymphoma  . Heart disease Mother   . Cancer Maternal Grandmother     breast  . Birth defects Maternal Grandmother     Social History:  reports that she quit smoking about 2 months ago. Her smoking use included Cigarettes. She has a 57.00 pack-year smoking history. She has never used smokeless tobacco. She reports that she does not drink alcohol or use drugs.  Allergies:  Allergies  Allergen Reactions  . Aspirin Nausea And Vomiting  . Codeine Nausea And Vomiting  . Morphine And Related Nausea And Vomiting    Medications reviewed.    ROS Full review of system was performed and is otherwise negative other than what is stated in the history of present illness   BP 129/69   Pulse 98   Temp 97.7 F (36.5  C) (Oral)   Ht '5\' 2"'$  (1.575 m)   Wt 64.9 kg (143 lb)   BMI 26.16 kg/m   Physical Exam  Constitutional: She is oriented to person, place, and time. No distress.  Chronically appearing  Eyes: EOM are normal. Right eye exhibits no discharge. No scleral icterus.  Neck: Neck supple. No JVD present.  Cardiovascular: Intact distal pulses.   No murmur heard. Pulmonary/Chest: Effort normal. No respiratory distress. She has wheezes. She has no rales.  Abdominal: Soft. She exhibits no distension and no mass. There is no tenderness. There is no rebound and no guarding.  Musculoskeletal: Normal range of motion. She exhibits no edema.  Neurological: She is alert and oriented to person, place, and time. Gait normal. GCS score is 15.  Skin: Skin is warm and dry. She is not diaphoretic.  Psychiatric: Mood,  memory, affect and judgment normal.  Nursing note and vitals reviewed.      No results found for this or any previous visit (from the past 48 hour(s)). No results found.  Assessment/Plan: Anal cancer in need for surveillance with an exam under anesthesia and anoscopy. Discussed in detail with the patient and also I discussed the case with Dr.Wohl. Other me do the exam under anesthesia and I do think that this is actually a better way to assess anal lesions rather than with a scope. We'll schedule her for exam under anesthesia in the OR to assess for the progression and the responsiveness of the anal cancer to therapy. I also discussed with her that if she does not require any further chemotherapy will be happy to remove the port some point in time   Caroleen Hamman, MD [facility]  06/03/2016,3:26 PM

## 2016-06-03 NOTE — Patient Instructions (Addendum)
We have scheduled your procedure on 06/23/16 at [facility]. Please see blue pre-care sheet for information. Please call our office if you have any questions or concerns. Please use a Fleets enema the day before your procedure.

## 2016-06-03 NOTE — Telephone Encounter (Signed)
Message forwarded to [facility] and [place], they will be able to speak more about scheduling with Dr. Fransisca Connors.

## 2016-06-03 NOTE — Telephone Encounter (Signed)
She is scheduled for analscopy on 10/4 to assess her status of her cancer, she has an appt scheduled with Dr Fransisca Connors that same day which was rescheduled be cause he had surgery 9/6, can this appt please be rescheduled to an earlier date?

## 2016-06-07 ENCOUNTER — Telehealth: Payer: Self-pay | Admitting: Surgery

## 2016-06-07 NOTE — Telephone Encounter (Signed)
Pt advised of pre op date/time and sx date. Sx: 06/23/16 with Dr Ginny Forth exam under anesthesia.  Pre op: 06/14/16 @ 1:00pm. Office.   Patient made aware to call [phone], between 1-3:00pm the day before surgery, to find out what time to arrive.

## 2016-06-07 NOTE — Progress Notes (Signed)
  Oncology Nurse Navigator Documentation  Navigator Location: [facility] (06/07/16 1100) Navigator Encounter Type: Telephone (06/07/16 1100) Telephone: Appt Confirmation/Clarification (06/07/16 1100)                                        Time Spent with Patient: 15 (06/07/16 1100)   Patient has conflicting appts with an upcoming anoscopy. Will reschedule Gyn Onc colposcopy for 10/18 with Dr Fransisca Connors

## 2016-06-11 ENCOUNTER — Ambulatory Visit (INDEPENDENT_AMBULATORY_CARE_PROVIDER_SITE_OTHER): Payer: Medicare PPO | Admitting: Internal Medicine

## 2016-06-11 ENCOUNTER — Encounter: Payer: Self-pay | Admitting: Internal Medicine

## 2016-06-11 VITALS — BP 139/82 | HR 91 | Resp 16 | Ht 62.0 in | Wt 142.0 lb

## 2016-06-11 DIAGNOSIS — I499 Cardiac arrhythmia, unspecified: Secondary | ICD-10-CM

## 2016-06-11 NOTE — Progress Notes (Signed)
Date:  06/11/2016   Name:  Carol Gay   DOB:  06/23/1946   MRN:  [id number]   Chief Complaint: Irregular Heart Beat [facility] NP said she needs ref for irregular heart rate ) Seen at home by [facility] nurse who said she had irregular heartbeat.  Patient feels well with no sx referable to her heart.  She has known [facility].  She thinks she may have an occasional palpitation but no associated sx.  She does not have intake of large amounts of caffeine.  She sleeps well.  She wears her O2 most of the time.   Review of Systems  Constitutional: Negative for chills and fatigue.  Respiratory: Positive for shortness of breath. Negative for chest tightness.   Cardiovascular: Positive for palpitations. Negative for chest pain.  Gastrointestinal: Negative for abdominal pain and blood in stool.  Neurological: Negative for dizziness and syncope.  Psychiatric/Behavioral: Negative for dysphoric mood. The patient is not nervous/anxious.     Patient Active Problem List   Diagnosis Date Noted  . Edema extremities 05/05/2016  . Weakness 02/08/2016  . 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 Blood in stool   . Benign neoplasm of transverse colon   . Benign neoplasm of rectosigmoid junction   . Fistula of intestine, excluding rectum and anus   . Pulmonary emphysema ([place]) 10/24/2015  . Encounter for long-term (current) use of medications 02/28/2015  . Aortic atherosclerosis ([place]) 12/30/2014  . CAD in native artery 12/30/2014  . DDD (degenerative disc disease), lumbar 12/30/2014  . Dyslipidemia 12/30/2014  . Essential (primary) hypertension 12/30/2014  . Dermatophytoses 12/30/2014  . Compulsive tobacco user syndrome 12/30/2014    Prior to Admission medications   Medication Sig Start Date End Date Taking? Authorizing Provider  acetaminophen (TYLENOL) 500 MG tablet Take 1,000 mg by mouth every 6 (six) hours as needed.   Yes Historical Provider, MD  albuterol (PROVENTIL HFA;VENTOLIN HFA) 108 (90  Base) MCG/ACT inhaler Inhale 2 puffs into the lungs every 6 (six) hours as needed for wheezing or shortness of breath. 09/19/15  Yes Glean Hess, MD  ALPRAZolam Duanne Moron) 0.25 MG tablet Take 1 tablet (0.25 mg total) by mouth 2 (two) times daily as needed for anxiety. 06/03/16  Yes Lloyd Huger, MD  ANORO ELLIPTA 62.5-25 MCG/INH AEPB INHALE 1 PUFF INTO THE LUNGS DAILY. 06/01/16  Yes Glean Hess, MD  atorvastatin (LIPITOR) 20 MG tablet TAKE 1 TABLET AT BEDTIME 03/16/16  Yes Glean Hess, MD  furosemide (LASIX) 20 MG tablet Take 1 tablet (20 mg total) by mouth daily. 04/01/16  Yes Glean Hess, MD  lidocaine-prilocaine (EMLA) cream Apply 1 application topically as needed. 12/10/15  Yes Diego F Pabon, MD  loperamide (IMODIUM) 2 MG capsule Take 1 capsule (2 mg total) by mouth every 6 (six) hours as needed for diarrhea or loose stools. 02/11/16  Yes [name], MD  ondansetron (ZOFRAN) 8 MG tablet Take 1 tablet (8 mg total) by mouth every 8 (eight) hours as needed for nausea or vomiting. 05/05/16  Yes Glean Hess, MD  OXYGEN Inhale 2 L/min into the lungs continuous.   Yes Historical Provider, MD  prochlorperazine (COMPAZINE) 10 MG tablet Take 1 tablet (10 mg total) by mouth every 6 (six) hours as needed for nausea or vomiting. 01/08/16  Yes Lloyd Huger, MD  silver sulfADIAZINE (SILVADENE) 1 % cream Apply 1 application topically 2 (two) times daily. 02/03/16  Yes Noreene Filbert, MD  traMADol-acetaminophen (ULTRACET) 37.5-325 MG tablet Take 1-2 tablets by mouth every 4 (four) hours as needed for moderate pain. 05/24/16  Yes Lequita Asal, MD  vitamin B-12 (CYANOCOBALAMIN) 500 MCG tablet Take 500 mcg by mouth daily.   Yes Historical Provider, MD    Allergies  Allergen Reactions  . Aspirin Nausea And Vomiting  . Codeine Nausea And Vomiting  . Morphine And Related Nausea And Vomiting    Past Surgical History:  Procedure Laterality Date  . APPENDECTOMY    . BREAST BIOPSY     . COLONOSCOPY WITH PROPOFOL N/A 11/06/2015   Procedure: COLONOSCOPY WITH PROPOFOL;  Surgeon: Lucilla Lame, MD;  Location: [place];  Service: Endoscopy;  Laterality: N/A;  LEAVE PT EARLY  . EXPLORATORY LAPAROTOMY    . LESION EXCISION  11/03/15   Anal - Dr Dahlia Byes, [facility]  . [place] SURGERY  1997  . OVARIAN CYST REMOVAL Left   . POLYPECTOMY  11/06/2015   Procedure: POLYPECTOMY;  Surgeon: Lucilla Lame, MD;  Location: [place];  Service: Endoscopy;;  . PORTACATH PLACEMENT N/A 12/10/2015   Procedure: INSERTION PORT-A-CATH;  Surgeon: Jules Husbands, MD;  Location: [facility];  Service: General;  Laterality: N/A;  . VEIN LIGATION AND STRIPPING      Social History  Substance Use Topics  . Smoking status: Former Smoker    Packs/day: 1.00    Years: 57.00    Types: Cigarettes    Quit date: 03/30/2016  . Smokeless tobacco: Never Used     Comment: not quite ready  . Alcohol use No     Medication list has been reviewed and updated.   Physical Exam  Constitutional: She is oriented to person, place, and time. She appears well-developed. No distress.  HENT:  Head: Normocephalic and atraumatic.  Neck: Normal range of motion. Neck supple. No thyromegaly present.  Cardiovascular: Normal rate, regular rhythm and normal heart sounds.   Occasional extrasystoles are present.  Pulmonary/Chest: Effort normal. No respiratory distress. She has decreased breath sounds. She has no wheezes. She has no rhonchi.  Musculoskeletal: She exhibits no edema or tenderness.  Neurological: She is alert and oriented to person, place, and time.  Skin: Skin is warm and dry. No rash noted.  Psychiatric: She has a normal mood and affect. Her behavior is normal. Thought content normal.  Nursing note and vitals reviewed.  Lab Results  Component Value Date   K 4.2 05/05/2016   Lab Results  Component Value Date   CREATININE 0.81 05/05/2016   Lab Results  Component Value Date   WBC 7.5  03/30/2016   HGB 11.5 (L) 03/30/2016   HCT 33.7 (L) 03/30/2016   MCV 95.5 03/30/2016   PLT 223 03/30/2016     BP 139/82   Pulse 91   Resp 16   Ht '5\' 2"'$  (1.575 m)   Wt 142 lb (64.4 kg)   SpO2 (!) 84% Comment: 2 liters O2  BMI 25.97 kg/m   Assessment and Plan: 1. Cardiac arrhythmia, unspecified cardiac arrhythmia type EKG shows frequent PVCs - patient is completely asx so no further workup should be needed Recent labs WNL Advised of s/s requiring further treatment and evaluation - EKG 12-Lead   Halina Maidens, MD [facility]  06/11/2016

## 2016-06-14 ENCOUNTER — Encounter
Admission: RE | Admit: 2016-06-14 | Discharge: 2016-06-14 | Disposition: A | Discharge status: Home / Self Care | Payer: Medicare PPO | Source: Ambulatory Visit | Attending: Surgery | Admitting: Surgery

## 2016-06-14 DIAGNOSIS — C210 Malignant neoplasm of anus, unspecified: Secondary | ICD-10-CM | POA: Diagnosis not present

## 2016-06-14 DIAGNOSIS — Z01818 Encounter for other preprocedural examination: Secondary | ICD-10-CM | POA: Insufficient documentation

## 2016-06-14 HISTORY — DX: Malignant (primary) neoplasm, unspecified: C80.1

## 2016-06-14 HISTORY — DX: Other reaction to spinal and lumbar puncture: G97.1

## 2016-06-14 LAB — BASIC METABOLIC PANEL
ANION GAP: 5 (ref 5–15)
BUN: 15 mg/dL (ref 6–20)
CHLORIDE: 101 mmol/L (ref 101–111)
CO2: 30 mmol/L (ref 22–32)
Calcium: 9.9 mg/dL (ref 8.9–10.3)
Creatinine, Ser: 0.93 mg/dL (ref 0.44–1.00)
GFR calc non Af Amer: >60 mL/min (ref >60)
Glucose, Bld: 101 mg/dL — ABNORMAL HIGH (ref 65–99)
Potassium: 4.3 mmol/L (ref 3.5–5.1)
Sodium: 136 mmol/L (ref 135–145)

## 2016-06-14 LAB — CBC
HEMATOCRIT: 36.3 % (ref 35.0–47.0)
HEMOGLOBIN: 12.4 g/dL (ref 12.0–16.0)
MCH: 30.9 pg (ref 26.0–34.0)
MCHC: 34.1 g/dL (ref 32.0–36.0)
MCV: 90.8 fL (ref 80.0–100.0)
Platelets: 220 10*3/uL (ref 150–440)
RBC: 4 MIL/uL (ref 3.80–5.20)
RDW: 15.5 % — ABNORMAL HIGH (ref 11.5–14.5)
WBC: 7.3 10*3/uL (ref 3.6–11.0)

## 2016-06-14 NOTE — Patient Instructions (Signed)
  Your procedure is scheduled on:Wednesday Oct. 4 , 2017. Report to Same Day Surgery. To find out your arrival time please call [phone] between 1PM - 3PM on Tuesday Oct. 3, 2017.  Remember: Instructions that are not followed completely may result in serious medical risk, up to and including death, or upon the discretion of your surgeon and anesthesiologist your surgery may need to be rescheduled.    _x___ 1. Do not eat food or drink liquids after midnight. No gum chewing or hard candies.     ____ 2. No Alcohol for 24 hours before or after surgery.   ____ 3. Bring all medications with you on the day of surgery if instructed.    __x__ 4. Notify your doctor if there is any change in your medical condition     (cold, fever, infections).    _____ 5. No smoking 24 hours prior to surgery.     Do not wear jewelry, make-up, hairpins, clips or nail polish.  Do not wear lotions, powders, or perfumes.   Do not shave 48 hours prior to surgery. Men may shave face and neck.  Do not bring valuables to the hospital.    [facility] is not responsible for any belongings or valuables.               Contacts, dentures or bridgework may not be worn into surgery.  Leave your suitcase in the car. After surgery it may be brought to your room.  For patients admitted to the hospital, discharge time is determined by your treatment team.   Patients discharged the day of surgery will not be allowed to drive home.    Please read over the following fact sheets that you were given:   [facility] Preparing for Surgery  ____ Take these medicines the morning of surgery with A SIP OF WATER: NONE     ____ Fleet Enema (as directed)   ____ Use CHG Soap as directed on instruction sheet  _x___ Use inhalers on the day of surgery and bring to hospital day of surgery  ____ Stop metformin 2 days prior to surgery    ____ Take 1/2 of usual insulin dose the night before surgery and none on the morning of  surgery.    ____ Stop Coumadin/Plavix/aspirin on does not apply.  _x_ Stop Anti-inflammatories such as Advil, Aleve, Ibuprofen, Motrin, Naproxen, Naprosyn, Goodies powders or aspirin products. OK to take Tylenol.   _x___ Stop supplementsvitamin B-12 (CYANOCOBALAMIN  until after surgery.    ____ Bring C-Pap to the hospital.

## 2016-06-22 ENCOUNTER — Other Ambulatory Visit: Payer: Self-pay | Admitting: *Deleted

## 2016-06-22 MED ORDER — TRAMADOL-ACETAMINOPHEN 37.5-325 MG PO TABS: 1.0000 | ORAL_TABLET | ORAL | 0 refills | Status: DC | PRN

## 2016-06-22 MED ORDER — PROCHLORPERAZINE MALEATE 10 MG PO TABS: 10.0000 mg | ORAL_TABLET | Freq: Four times a day (QID) | ORAL | 1 refills | Status: DC | PRN

## 2016-06-23 ENCOUNTER — Ambulatory Visit: Payer: Medicare PPO

## 2016-06-23 ENCOUNTER — Ambulatory Visit
Admission: RE | Admit: 2016-06-23 | Discharge: 2016-06-23 | Disposition: A | Discharge status: Home / Self Care | Payer: Medicare PPO | Source: Ambulatory Visit | Attending: Surgery | Admitting: Surgery

## 2016-06-23 ENCOUNTER — Encounter
Admission: RE | Disposition: A | Discharge status: Home / Self Care | Payer: Self-pay | Source: Ambulatory Visit | Attending: Surgery

## 2016-06-23 ENCOUNTER — Encounter: Payer: Self-pay | Admitting: Anesthesiology

## 2016-06-23 DIAGNOSIS — E785 Hyperlipidemia, unspecified: Secondary | ICD-10-CM | POA: Insufficient documentation

## 2016-06-23 DIAGNOSIS — M5136 Other intervertebral disc degeneration, lumbar region: Secondary | ICD-10-CM | POA: Insufficient documentation

## 2016-06-23 DIAGNOSIS — Z87891 Personal history of nicotine dependence: Secondary | ICD-10-CM | POA: Diagnosis not present

## 2016-06-23 DIAGNOSIS — C210 Malignant neoplasm of anus, unspecified: Secondary | ICD-10-CM | POA: Diagnosis present

## 2016-06-23 DIAGNOSIS — I10 Essential (primary) hypertension: Secondary | ICD-10-CM | POA: Diagnosis not present

## 2016-06-23 DIAGNOSIS — I252 Old myocardial infarction: Secondary | ICD-10-CM | POA: Insufficient documentation

## 2016-06-23 DIAGNOSIS — J449 Chronic obstructive pulmonary disease, unspecified: Secondary | ICD-10-CM | POA: Insufficient documentation

## 2016-06-23 DIAGNOSIS — I2510 Atherosclerotic heart disease of native coronary artery without angina pectoris: Secondary | ICD-10-CM | POA: Diagnosis not present

## 2016-06-23 HISTORY — PX: RECTAL EXAM UNDER ANESTHESIA: SHX6399

## 2016-06-23 SURGERY — EXAM UNDER ANESTHESIA, RECTUM
Anesthesia: Choice

## 2016-06-23 MED ORDER — FAMOTIDINE 20 MG PO TABS
ORAL_TABLET | ORAL | Status: AC
  Filled 2016-06-23: qty 1

## 2016-06-23 SURGICAL SUPPLY — 15 items
CANISTER SUCT 3000ML (MISCELLANEOUS) ×1 IMPLANT
DRAPE LEGGINS SURG 28X43 STRL (DRAPES) ×1 IMPLANT
DRAPE SHEET LG 3/4 BI-LAMINATE (DRAPES) ×1 IMPLANT
DRAPE UNDER BUTTOCK W/FLU (DRAPES) ×1 IMPLANT
ELECT REM PT RETURN 9FT ADLT (ELECTROSURGICAL)
ELECTRODE REM PT RTRN 9FT ADLT (ELECTROSURGICAL) ×1 IMPLANT
GLOVE BIO SURGEON STRL SZ7 (GLOVE) ×1 IMPLANT
GOWN STRL REUS W/ TWL LRG LVL3 (GOWN DISPOSABLE) ×2 IMPLANT
GOWN STRL REUS W/TWL LRG LVL3 (GOWN DISPOSABLE)
JELLY LUB 2OZ STRL (MISCELLANEOUS)
JELLY LUBE 2OZ STRL (MISCELLANEOUS) ×1 IMPLANT
NS IRRIG 1000ML POUR BTL (IV SOLUTION) ×1 IMPLANT
PACK BASIN MINOR ARMC (MISCELLANEOUS) ×1 IMPLANT
SOL PREP PVP 2OZ (MISCELLANEOUS)
SOLUTION PREP PVP 2OZ (MISCELLANEOUS) ×1 IMPLANT

## 2016-06-23 NOTE — OR Nursing (Signed)
Dr Micah Flesher notified that patient is complaining of having chest congestion and increased work of breathing today. Dr Micah Flesher in to see patient procedure canceled.  Patient given choice to be evaluated in ER or go home patient states

## 2016-06-23 NOTE — H&P (View-Only) (Signed)
Outpatient Surgical Follow Up  06/03/2016  Carol Gay is an 70 y.o. female.   Chief Complaint  Patient presents with  . Follow-up    Anal Cancer    HPI: Carol Gay is following up after she was diagnosed with anal carcinoma unfair rate 2017 ready to complete her chemotherapy and radiation therapy with good result and she is now on remission. She is due for an anoscopy and exam under anesthesia to follow the progression of her disease clinically. She reports that she still has some intermittent anorectal discomfort that was caused by her radiation therapy. She does feel weak and has decreased appetite as well as significant weight loss. She has significant COPD but has stopped smoking. She now is wears oxygen  Past Medical History:  Diagnosis Date  . Aortic atherosclerosis ([place])   . Arm pain   . Asthma   . CAD in native artery   . Cigarette smoker   . Claustrophobia   . 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 COPD (chronic obstructive pulmonary disease) ([place])   . DDD (degenerative disc disease), lumbar       . Dermatophytoses   . Dyslipidemia   . H/O blood clots    left leg, veins stripped  . Hemorrhoids   . Hypertension   . Last menstrual period (LMP) > 10 days ago 1994  . Myocardial infarction [facility])    "mild" - age 19  . Pulmonary emphysema ([place])   . Rectal bleeding   . Shortness of breath dyspnea   . Smokers' cough ([place])   . Vertigo   . Wears dentures    has full upper and lower - doesn't wear    Past Surgical History:  Procedure Laterality Date  . APPENDECTOMY    . BREAST BIOPSY    . COLONOSCOPY WITH PROPOFOL N/A 11/06/2015   Procedure: COLONOSCOPY WITH PROPOFOL;  Surgeon: Lucilla Lame, MD;  Location: [facility];  Service: Endoscopy;  Laterality: N/A;  LEAVE PT EARLY  . EXPLORATORY LAPAROTOMY    . LESION EXCISION  11/03/15   Anal - Dr Dahlia Byes, [facility]  . [facility]  1997  . OVARIAN CYST REMOVAL Left   . POLYPECTOMY   11/06/2015   Procedure: POLYPECTOMY;  Surgeon: Lucilla Lame, MD;  Location: [facility];  Service: Endoscopy;;  . PORTACATH PLACEMENT N/A 12/10/2015   Procedure: INSERTION PORT-A-CATH;  Surgeon: Jules Husbands, MD;  Location: [facility];  Service: General;  Laterality: N/A;  . VEIN LIGATION AND STRIPPING      Family History  Problem Relation Age of Onset  . Cancer Mother     lymphoma  . Heart disease Mother   . Cancer Maternal Grandmother     breast  . Birth defects Maternal Grandmother     Social History:  reports that she quit smoking about 2 months ago. Her smoking use included Cigarettes. She has a 57.00 pack-year smoking history. She has never used smokeless tobacco. She reports that she does not drink alcohol or use drugs.  Allergies:  Allergies  Allergen Reactions  . Aspirin Nausea And Vomiting  . Codeine Nausea And Vomiting  . Morphine And Related Nausea And Vomiting    Medications reviewed.    ROS Full review of system was performed and is otherwise negative other than what is stated in the history of present illness   BP 129/69   Pulse 98   Temp 97.7 F (36.5  C) (Oral)   Ht '5\' 2"'$  (1.575 m)   Wt 64.9 kg (143 lb)   BMI 26.16 kg/m   Physical Exam  Constitutional: She is oriented to person, place, and time. No distress.  Chronically appearing  Eyes: EOM are normal. Right eye exhibits no discharge. No scleral icterus.  Neck: Neck supple. No JVD present.  Cardiovascular: Intact distal pulses.   No murmur heard. Pulmonary/Chest: Effort normal. No respiratory distress. She has wheezes. She has no rales.  Abdominal: Soft. She exhibits no distension and no mass. There is no tenderness. There is no rebound and no guarding.  Musculoskeletal: Normal range of motion. She exhibits no edema.  Neurological: She is alert and oriented to person, place, and time. Gait normal. GCS score is 15.  Skin: Skin is warm and dry. She is not diaphoretic.  Psychiatric: Mood,  memory, affect and judgment normal.  Nursing note and vitals reviewed.      No results found for this or any previous visit (from the past 48 hour(s)). No results found.  Assessment/Plan: Anal cancer in need for surveillance with an exam under anesthesia and anoscopy. Discussed in detail with the patient and also I discussed the case with Dr.Wohl. Other me do the exam under anesthesia and I do think that this is actually a better way to assess anal lesions rather than with a scope. We'll schedule her for exam under anesthesia in the OR to assess for the progression and the responsiveness of the anal cancer to therapy. I also discussed with her that if she does not require any further chemotherapy will be happy to remove the port some point in time   Caroleen Hamman, MD [facility]  06/03/2016,3:26 PM

## 2016-06-23 NOTE — OR Nursing (Signed)
Patient desires to go home when given option to go To ER. Notified Dr Dahlia Byes as well he is aware that patient wished to go home.  MD would like patient to f/u with him.  Instructions given to patient and family verbalize understanding.

## 2016-06-23 NOTE — Interval H&P Note (Signed)
History and Physical Interval Note:  06/23/2016 10:57 AM  Carol Gay  has presented today for surgery, with the diagnosis of Anal cancer  The various methods of treatment have been discussed with the patient and family. After consideration of risks, benefits and other options for treatment, the patient has consented to  Procedure(s): RECTAL EXAM UNDER ANESTHESIA (N/A) as a surgical intervention .  The patient's history has been reviewed, patient examined, no change in status, stable for surgery.  I have reviewed the patient's chart and labs.  Questions were answered to the patient's satisfaction.     [place]

## 2016-06-24 ENCOUNTER — Encounter: Payer: Self-pay | Admitting: Surgery

## 2016-06-27 NOTE — Progress Notes (Signed)
[facility]  [phone]  [phone]     Carol Gay DOB: 12/06/1945  MR#: [id number]  WPY#:[id number]  Patient Care Team: Glean Hess, MD as PCP - General (Family Medicine) Clent Jacks, RN as Registered Nurse Lucilla Lame, MD as Consulting Physician (Gastroenterology)  CHIEF COMPLAINT:  Squamous cell carcinoma of the anus, stage II, T2 N0 M0.  INTERVAL HISTORY:   Patient returns to clinic today for routine 3 month evaluation. She continues to feel weak and fatigue. She has a good appetite, but has lost weight in the interim. She has no neurologic complaints. She denies any recent fevers. She denies any chest pain, cough, hemoptysis, or shortness of breath. She has no abdominal pain. She denies any nausea, vomiting, constipation, or diarrhea. She has no melena or hematochezia. She has no urinary complaints. Patient offers no further specific complaints today.  REVIEW OF SYSTEMS:   Review of Systems  Constitutional: Positive for malaise/fatigue and weight loss. Negative for fever.  HENT: Negative.   Eyes: Negative.   Respiratory: Negative for cough and shortness of breath.   Cardiovascular: Negative for chest pain and leg swelling.  Gastrointestinal: Negative for abdominal pain, blood in stool, constipation, diarrhea, melena, nausea and vomiting.  Genitourinary: Negative.   Musculoskeletal: Negative.   Neurological: Positive for weakness.  Endo/Heme/Allergies: Does not bruise/bleed easily.  Psychiatric/Behavioral: Negative.  The patient is not nervous/anxious.     As per HPI. Otherwise, a complete review of systems is negative.   PAST MEDICAL HISTORY: Past Medical History:  Diagnosis Date  . Aortic atherosclerosis ([place])   . Arm pain   . Asthma   . CAD in native artery   . Cancer ([place]) 09/2015   rectum  . Cigarette smoker   . Claustrophobia   . 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 COPD (chronic  obstructive pulmonary disease) ([place])   . DDD (degenerative disc disease), lumbar       . Dermatophytoses   . Dyslipidemia   . H/O blood clots    left leg, veins stripped  . Hemorrhoids   . Hypertension   . Last menstrual period (LMP) > 10 days ago 1994  . Myocardial infarction    "mild" - age 72  . Pulmonary emphysema ([place])   . Rectal bleeding   . Shortness of breath dyspnea   . Smokers' cough ([place])   . Spinal headache    with one of my children's birth  . Vertigo   . Wears dentures    has full upper and lower - doesn't wear    PAST SURGICAL HISTORY: Past Surgical History:  Procedure Laterality Date  . APPENDECTOMY    . BREAST BIOPSY Left 1970's  . COLONOSCOPY WITH PROPOFOL N/A 11/06/2015   Procedure: COLONOSCOPY WITH PROPOFOL;  Surgeon: Lucilla Lame, MD;  Location: [facility];  Service: Endoscopy;  Laterality: N/A;  LEAVE PT EARLY  . EXPLORATORY LAPAROTOMY    . LESION EXCISION  11/03/15   Anal - Dr Dahlia Byes, [facility]  . LESION REMOVAL  1970's   from tailbone  . [place] SURGERY  1997  . OVARIAN CYST REMOVAL Left   . POLYPECTOMY  11/06/2015   Procedure: POLYPECTOMY;  Surgeon: Lucilla Lame, MD;  Location: [facility];  Service: Endoscopy;;  . PORTACATH PLACEMENT N/A 12/10/2015   Procedure: INSERTION PORT-A-CATH;  Surgeon: Jules Husbands, MD;  Location: [facility];  Service: General;  Laterality:  N/A;  . RECTAL EXAM UNDER ANESTHESIA N/A 06/23/2016   Procedure: RECTAL EXAM UNDER ANESTHESIA;  Surgeon: Jules Husbands, MD;  Location: [facility];  Service: General;  Laterality: N/A;  . VEIN LIGATION AND STRIPPING      FAMILY HISTORY Family History  Problem Relation Age of Onset  . Cancer Mother     lymphoma  . Heart disease Mother   . Cancer Maternal Grandmother     breast  . Birth defects Maternal Grandmother     GYNECOLOGIC HISTORY:  No LMP recorded. Patient is postmenopausal.     ADVANCED DIRECTIVES:    HEALTH MAINTENANCE: Social History  Substance Use  Topics  . Smoking status: Former Smoker    Packs/day: 1.00    Years: 57.00    Types: Cigarettes    Quit date: 03/30/2016  . Smokeless tobacco: Never Used     Comment: not quite ready  . Alcohol use No     Allergies  Allergen Reactions  . Aspirin Nausea And Vomiting  . Codeine Nausea And Vomiting  . Morphine And Related Nausea And Vomiting    Current Outpatient Prescriptions  Medication Sig Dispense Refill  . acetaminophen (TYLENOL) 500 MG tablet Take 1,000 mg by mouth every 6 (six) hours as needed.    Marland Kitchen albuterol (PROVENTIL HFA;VENTOLIN HFA) 108 (90 Base) MCG/ACT inhaler Inhale 2 puffs into the lungs every 6 (six) hours as needed for wheezing or shortness of breath. 3 Inhaler 1  . ALPRAZolam (XANAX) 0.25 MG tablet Take 1 tablet (0.25 mg total) by mouth 2 (two) times daily as needed for anxiety. 60 tablet 0  . ANORO ELLIPTA 62.5-25 MCG/INH AEPB INHALE 1 PUFF INTO THE LUNGS DAILY. 180 each 1  . atorvastatin (LIPITOR) 20 MG tablet TAKE 1 TABLET AT BEDTIME 90 tablet 1  . furosemide (LASIX) 20 MG tablet Take 1 tablet (20 mg total) by mouth daily. 30 tablet 3  . lidocaine-prilocaine (EMLA) cream Apply 1 application topically as needed. 30 g 3  . loperamide (IMODIUM) 2 MG capsule Take 1 capsule (2 mg total) by mouth every 6 (six) hours as needed for diarrhea or loose stools. 30 capsule 0  . OXYGEN Inhale 2.5 L/min into the lungs continuous.     . prochlorperazine (COMPAZINE) 10 MG tablet Take 1 tablet (10 mg total) by mouth every 6 (six) hours as needed for nausea or vomiting. 30 tablet 1  . traMADol-acetaminophen (ULTRACET) 37.5-325 MG tablet Take 1-2 tablets by mouth every 4 (four) hours as needed for moderate pain. 30 tablet 0  . vitamin B-12 (CYANOCOBALAMIN) 500 MCG tablet Take 500 mcg by mouth daily.     No current facility-administered medications for this visit.     OBJECTIVE: BP 125/72 (BP Location: Right Arm, Patient Position: Sitting)   Pulse 93   Temp 97.5 F (36.4 C)  (Oral)   Resp 18   Wt 140 lb 5.2 oz (63.6 kg)   BMI 25.67 kg/m    Body mass index is 25.67 kg/m.    ECOG FS:0 - Asymptomatic  General: Well-developed, well-nourished, no acute distress. Eyes: Pink conjunctiva, anicteric sclera. Lungs: Clear to auscultation bilaterally. Heart: Regular rate and rhythm. No rubs, murmurs, or gallops. Abdomen: Soft, nontender, nondistended. No organomegaly noted, normoactive bowel sounds. Musculoskeletal: mild nonpitting edema bilateral feet.                 Neuro: Alert, answering all questions appropriately. Cranial nerves grossly intact. Skin: No rashes or petechiae noted. Psych: Normal  affect.   LAB RESULTS:  Infusion on 06/29/2016  Component Date Value Ref Range Status  . WBC 06/29/2016 7.0  3.6 - 11.0 K/uL Final  . RBC 06/29/2016 3.95  3.80 - 5.20 MIL/uL Final  . Hemoglobin 06/29/2016 11.9* 12.0 - 16.0 g/dL Final  . HCT 06/29/2016 35.5  35.0 - 47.0 % Final  . MCV 06/29/2016 89.8  80.0 - 100.0 fL Final  . MCH 06/29/2016 30.1  26.0 - 34.0 pg Final  . MCHC 06/29/2016 33.6  32.0 - 36.0 g/dL Final  . RDW 06/29/2016 16.1* 11.5 - 14.5 % Final  . Platelets 06/29/2016 227  150 - 440 K/uL Final  . Neutrophils Relative % 06/29/2016 66  % Final  . Neutro Abs 06/29/2016 4.6  1.4 - 6.5 K/uL Final  . Lymphocytes Relative 06/29/2016 20  % Final  . Lymphs Abs 06/29/2016 1.4  1.0 - 3.6 K/uL Final  . Monocytes Relative 06/29/2016 8  % Final  . Monocytes Absolute 06/29/2016 0.6  0.2 - 0.9 K/uL Final  . Eosinophils Relative 06/29/2016 5  % Final  . Eosinophils Absolute 06/29/2016 0.4  0 - 0.7 K/uL Final  . Basophils Relative 06/29/2016 1  % Final  . Basophils Absolute 06/29/2016 0.1  0 - 0.1 K/uL Final  . Sodium 06/29/2016 135  135 - 145 mmol/L Final  . Potassium 06/29/2016 3.6  3.5 - 5.1 mmol/L Final  . Chloride 06/29/2016 102  101 - 111 mmol/L Final  . CO2 06/29/2016 24  22 - 32 mmol/L Final  . Glucose, Bld 06/29/2016 119* 65 - 99 mg/dL Final  . BUN  06/29/2016 15  6 - 20 mg/dL Final  . Creatinine, Ser 06/29/2016 0.64  0.44 - 1.00 mg/dL Final  . Calcium 06/29/2016 9.7  8.9 - 10.3 mg/dL Final  . Total Protein 06/29/2016 7.4  6.5 - 8.1 g/dL Final  . Albumin 06/29/2016 3.8  3.5 - 5.0 g/dL Final  . AST 06/29/2016 17  15 - 41 U/L Final  . ALT 06/29/2016 9* 14 - 54 U/L Final  . Alkaline Phosphatase 06/29/2016 63  38 - 126 U/L Final  . Total Bilirubin 06/29/2016 0.5  0.3 - 1.2 mg/dL Final  . GFR calc non Af Amer 06/29/2016 >60  >60 mL/min Final  . GFR calc Af Amer 06/29/2016 >60  >60 mL/min Final   Comment: (NOTE) The eGFR has been calculated using the CKD EPI equation. This calculation has not been validated in all clinical situations. eGFR's persistently <60 mL/min signify possible Chronic Kidney Disease.   . Anion gap 06/29/2016 9  5 - 15 Final    STUDIES: Ct Chest W Contrast  Result Date: 07/02/2016 CLINICAL DATA:  71 year old female with squamous cell carcinoma of the anus diagnosed January 2017 status post chemotherapy and radiation therapy, presenting with 30 pound weight loss. Worsening shortness of breath. Restaging. EXAM: CT CHEST, ABDOMEN, AND PELVIS WITH CONTRAST TECHNIQUE: Multidetector CT imaging of the chest, abdomen and pelvis was performed following the standard protocol during bolus administration of intravenous contrast. CONTRAST:  18m ISOVUE-300 IOPAMIDOL (ISOVUE-300) INJECTION 61% COMPARISON:  03/25/2016 PET-CT. FINDINGS: CT CHEST FINDINGS Cardiovascular: Normal heart size. Trace pericardial effusion/ thickening is stable. Right internal jugular MediPort terminates at the lower third of the superior vena cava. Atherosclerotic nonaneurysmal thoracic aorta. Normal caliber pulmonary arteries. No central pulmonary emboli. Mediastinum/Nodes: No discrete thyroid nodules. Unremarkable esophagus. No axillary adenopathy. Newly enlarged 1.6 cm right lower paratracheal node (series 2/ image 26), previously 0.9 cm on 03/25/2016.  Newly  enlarged 1.0 cm right infrahilar node (series 2/ image 34). Newly mildly enlarged 1.0 cm left hilar node (series 2/ image 32). Newly mildly enlarged 1.0 cm AP window node (series 2/ image 24). Lungs/Pleura: No pneumothorax. New trace bilateral pleural effusions. No appreciable pleural thickening or nodularity. Moderate to severe centrilobular emphysema and diffuse bronchial wall thickening. There is extensive patchy perilobular consolidation and ground-glass opacity throughout both lungs involving the bilateral upper and lower lobes, asymmetric to the right. These findings have overall significantly worsened since 03/25/2016, although the most prominent focus of consolidation on the prior chest CT study in the basilar right upper lobe has decreased in the interval. No discrete lung masses or pulmonary nodules in the aerated portions of the lungs. Musculoskeletal: No aggressive appearing focal osseous lesions. Mild thoracic spondylosis. CT ABDOMEN PELVIS FINDINGS Hepatobiliary: Normal liver with no liver mass. Cholelithiasis, with no gallbladder distention, gallbladder wall thickening or pericholecystic fluid. No biliary ductal dilatation. Pancreas: Normal, with no mass or duct dilation. Spleen: Stable appearance of the normal size spleen with no splenic mass and with solitary granulomatous splenic calcification. Adrenals/Urinary Tract: Normal adrenals. Stable asymmetric scarring in the right kidney. No hydronephrosis. No renal mass. Relatively collapsed and grossly normal bladder. Stomach/Bowel: Grossly normal stomach. Normal caliber small bowel with no small bowel wall thickening. Appendectomy. Mild sigmoid diverticulosis, with no large bowel wall thickening or pericolonic fat stranding. The CT appearance of the anus is stable with mild circumferential fullness of the anal wall and no discrete anal mass. Vascular/Lymphatic: Abdominal aortic atherosclerosis. Stable ectasia of the infrarenal abdominal aorta, maximum  diameter 2.6 cm. Patent portal, splenic, hepatic and renal veins. No pathologically enlarged lymph nodes in the abdomen or pelvis. Reproductive: Grossly normal uterus.  No adnexal mass. Other: No pneumoperitoneum, ascites or focal fluid collection. Musculoskeletal: No aggressive appearing focal osseous lesions. Moderate lumbar spondylosis. IMPRESSION: 1. Overall significant worsening of patchy perilobular consolidation and ground-glass opacity throughout both lungs, asymmetrically involving the right lung, although the most prominent focus of consolidation in the right upper lobe on the 03/25/2016 chest CT has improved. Imaging findings are most suggestive of an atypical inflammatory pneumonia such as due to cryptogenic organizing pneumonia or drug toxicity. Infection is a consideration, although considered less likely. The appearance is not typical of metastatic disease. 2. New mild mediastinal and bilateral hilar lymphadenopathy. In light of the lung findings, these nodes could be reactive, although metastatic disease cannot be excluded. 3. No evidence of metastatic disease in the abdomen or pelvis. 4. Stable CT appearance of the anus with no discrete anal mass. 5. Additional findings include aortic atherosclerosis, stable trace pericardial effusion/thickening,, moderate to severe emphysema, cholelithiasis, stable ectasia of the infrarenal abdominal aorta with maximum diameter 2.6 cm, and mild sigmoid diverticulosis. Electronically Signed   By: Ilona Sorrel M.D.   On: 07/02/2016 17:10   Ct Abdomen Pelvis W Contrast  Result Date: 07/02/2016 CLINICAL DATA:  70 year old female with squamous cell carcinoma of the anus diagnosed January 2017 status post chemotherapy and radiation therapy, presenting with 30 pound weight loss. Worsening shortness of breath. Restaging. EXAM: CT CHEST, ABDOMEN, AND PELVIS WITH CONTRAST TECHNIQUE: Multidetector CT imaging of the chest, abdomen and pelvis was performed following the  standard protocol during bolus administration of intravenous contrast. CONTRAST:  37m ISOVUE-300 IOPAMIDOL (ISOVUE-300) INJECTION 61% COMPARISON:  03/25/2016 PET-CT. FINDINGS: CT CHEST FINDINGS Cardiovascular: Normal heart size. Trace pericardial effusion/ thickening is stable. Right internal jugular MediPort terminates at the lower third of the  superior vena cava. Atherosclerotic nonaneurysmal thoracic aorta. Normal caliber pulmonary arteries. No central pulmonary emboli. Mediastinum/Nodes: No discrete thyroid nodules. Unremarkable esophagus. No axillary adenopathy. Newly enlarged 1.6 cm right lower paratracheal node (series 2/ image 26), previously 0.9 cm on 03/25/2016. Newly enlarged 1.0 cm right infrahilar node (series 2/ image 34). Newly mildly enlarged 1.0 cm left hilar node (series 2/ image 32). Newly mildly enlarged 1.0 cm AP window node (series 2/ image 24). Lungs/Pleura: No pneumothorax. New trace bilateral pleural effusions. No appreciable pleural thickening or nodularity. Moderate to severe centrilobular emphysema and diffuse bronchial wall thickening. There is extensive patchy perilobular consolidation and ground-glass opacity throughout both lungs involving the bilateral upper and lower lobes, asymmetric to the right. These findings have overall significantly worsened since 03/25/2016, although the most prominent focus of consolidation on the prior chest CT study in the basilar right upper lobe has decreased in the interval. No discrete lung masses or pulmonary nodules in the aerated portions of the lungs. Musculoskeletal: No aggressive appearing focal osseous lesions. Mild thoracic spondylosis. CT ABDOMEN PELVIS FINDINGS Hepatobiliary: Normal liver with no liver mass. Cholelithiasis, with no gallbladder distention, gallbladder wall thickening or pericholecystic fluid. No biliary ductal dilatation. Pancreas: Normal, with no mass or duct dilation. Spleen: Stable appearance of the normal size spleen  with no splenic mass and with solitary granulomatous splenic calcification. Adrenals/Urinary Tract: Normal adrenals. Stable asymmetric scarring in the right kidney. No hydronephrosis. No renal mass. Relatively collapsed and grossly normal bladder. Stomach/Bowel: Grossly normal stomach. Normal caliber small bowel with no small bowel wall thickening. Appendectomy. Mild sigmoid diverticulosis, with no large bowel wall thickening or pericolonic fat stranding. The CT appearance of the anus is stable with mild circumferential fullness of the anal wall and no discrete anal mass. Vascular/Lymphatic: Abdominal aortic atherosclerosis. Stable ectasia of the infrarenal abdominal aorta, maximum diameter 2.6 cm. Patent portal, splenic, hepatic and renal veins. No pathologically enlarged lymph nodes in the abdomen or pelvis. Reproductive: Grossly normal uterus.  No adnexal mass. Other: No pneumoperitoneum, ascites or focal fluid collection. Musculoskeletal: No aggressive appearing focal osseous lesions. Moderate lumbar spondylosis. IMPRESSION: 1. Overall significant worsening of patchy perilobular consolidation and ground-glass opacity throughout both lungs, asymmetrically involving the right lung, although the most prominent focus of consolidation in the right upper lobe on the 03/25/2016 chest CT has improved. Imaging findings are most suggestive of an atypical inflammatory pneumonia such as due to cryptogenic organizing pneumonia or drug toxicity. Infection is a consideration, although considered less likely. The appearance is not typical of metastatic disease. 2. New mild mediastinal and bilateral hilar lymphadenopathy. In light of the lung findings, these nodes could be reactive, although metastatic disease cannot be excluded. 3. No evidence of metastatic disease in the abdomen or pelvis. 4. Stable CT appearance of the anus with no discrete anal mass. 5. Additional findings include aortic atherosclerosis, stable trace  pericardial effusion/thickening,, moderate to severe emphysema, cholelithiasis, stable ectasia of the infrarenal abdominal aorta with maximum diameter 2.6 cm, and mild sigmoid diverticulosis. Electronically Signed   By: Ilona Sorrel M.D.   On: 07/02/2016 17:10    ASSESSMENT:  Squamous cell carcinoma of the anus, stage II, T2 N0 M0.  PLAN:   1. Squamous cell carcinoma of the anus:CT scan results from July 02, 2016, after patient's clinic appointment, reviewed independently and reported as above. Per NCCN guidelines, patient will require an anoscope every 6-12 months for the next 2 years. Will also do abdominal and pelvic CT with contrast annually  for the next 3 years. Return to clinic in 3 months with repeat laboratory work and further evaluation.Continued follow-up with GI as scheduled. 2. Bilateral peripheral edema: Improved. Unlikely related to patient's malignancy or treatment. Continued evaluation per primary care. 3. Weight loss: CT scan suggests possible atypical pneumonia which could be contributing. Monitor.  Patient expressed understanding and was in agreement with this plan. She also understands that She can call clinic at any time with any questions, concerns, or complaints.    Squamous cell carcinoma of anus (HCC)   Staging form: Anus, AJCC 7th Edition     Clinical stage from 11/28/2015: Stage II (T2, N0, M0) - Signed by Lloyd Huger, MD on 11/28/2015   Lloyd Huger, MD   07/04/2016 4:45 PM

## 2016-06-29 ENCOUNTER — Inpatient Hospital Stay: Payer: Medicare PPO

## 2016-06-29 ENCOUNTER — Inpatient Hospital Stay (HOSPITAL_BASED_OUTPATIENT_CLINIC_OR_DEPARTMENT_OTHER): Payer: Medicare PPO | Admitting: Oncology

## 2016-06-29 ENCOUNTER — Inpatient Hospital Stay: Payer: Medicare PPO | Attending: Oncology

## 2016-06-29 VITALS — BP 125/72 | HR 93 | Temp 97.5°F | Resp 18 | Wt 140.3 lb

## 2016-06-29 DIAGNOSIS — F17210 Nicotine dependence, cigarettes, uncomplicated: Secondary | ICD-10-CM | POA: Insufficient documentation

## 2016-06-29 DIAGNOSIS — Z9221 Personal history of antineoplastic chemotherapy: Secondary | ICD-10-CM | POA: Insufficient documentation

## 2016-06-29 DIAGNOSIS — K649 Unspecified hemorrhoids: Secondary | ICD-10-CM | POA: Diagnosis not present

## 2016-06-29 DIAGNOSIS — I2510 Atherosclerotic heart disease of native coronary artery without angina pectoris: Secondary | ICD-10-CM | POA: Diagnosis not present

## 2016-06-29 DIAGNOSIS — Z79899 Other long term (current) drug therapy: Secondary | ICD-10-CM | POA: Diagnosis not present

## 2016-06-29 DIAGNOSIS — I252 Old myocardial infarction: Secondary | ICD-10-CM | POA: Insufficient documentation

## 2016-06-29 DIAGNOSIS — C210 Malignant neoplasm of anus, unspecified: Secondary | ICD-10-CM | POA: Diagnosis not present

## 2016-06-29 DIAGNOSIS — E785 Hyperlipidemia, unspecified: Secondary | ICD-10-CM | POA: Insufficient documentation

## 2016-06-29 DIAGNOSIS — R634 Abnormal weight loss: Secondary | ICD-10-CM | POA: Diagnosis not present

## 2016-06-29 DIAGNOSIS — Z808 Family history of malignant neoplasm of other organs or systems: Secondary | ICD-10-CM | POA: Insufficient documentation

## 2016-06-29 DIAGNOSIS — A630 Anogenital (venereal) warts: Secondary | ICD-10-CM | POA: Diagnosis not present

## 2016-06-29 DIAGNOSIS — B359 Dermatophytosis, unspecified: Secondary | ICD-10-CM | POA: Diagnosis not present

## 2016-06-29 DIAGNOSIS — J449 Chronic obstructive pulmonary disease, unspecified: Secondary | ICD-10-CM | POA: Diagnosis not present

## 2016-06-29 DIAGNOSIS — K5730 Diverticulosis of large intestine without perforation or abscess without bleeding: Secondary | ICD-10-CM

## 2016-06-29 DIAGNOSIS — K8020 Calculus of gallbladder without cholecystitis without obstruction: Secondary | ICD-10-CM

## 2016-06-29 DIAGNOSIS — J45909 Unspecified asthma, uncomplicated: Secondary | ICD-10-CM

## 2016-06-29 DIAGNOSIS — I10 Essential (primary) hypertension: Secondary | ICD-10-CM | POA: Insufficient documentation

## 2016-06-29 DIAGNOSIS — R590 Localized enlarged lymph nodes: Secondary | ICD-10-CM

## 2016-06-29 DIAGNOSIS — Z923 Personal history of irradiation: Secondary | ICD-10-CM | POA: Diagnosis not present

## 2016-06-29 DIAGNOSIS — M5136 Other intervertebral disc degeneration, lumbar region: Secondary | ICD-10-CM | POA: Diagnosis not present

## 2016-06-29 DIAGNOSIS — M47814 Spondylosis without myelopathy or radiculopathy, thoracic region: Secondary | ICD-10-CM | POA: Insufficient documentation

## 2016-06-29 DIAGNOSIS — R531 Weakness: Secondary | ICD-10-CM

## 2016-06-29 DIAGNOSIS — R5383 Other fatigue: Secondary | ICD-10-CM | POA: Diagnosis not present

## 2016-06-29 DIAGNOSIS — Z803 Family history of malignant neoplasm of breast: Secondary | ICD-10-CM | POA: Insufficient documentation

## 2016-06-29 DIAGNOSIS — Q602 Renal agenesis, unspecified: Secondary | ICD-10-CM

## 2016-06-29 DIAGNOSIS — K632 Fistula of intestine: Secondary | ICD-10-CM | POA: Diagnosis not present

## 2016-06-29 DIAGNOSIS — I700 Atherosclerosis of aorta: Secondary | ICD-10-CM

## 2016-06-29 DIAGNOSIS — J439 Emphysema, unspecified: Secondary | ICD-10-CM

## 2016-06-29 DIAGNOSIS — Z86010 Personal history of colonic polyps: Secondary | ICD-10-CM | POA: Insufficient documentation

## 2016-06-29 LAB — COMPREHENSIVE METABOLIC PANEL
ALT: 9 U/L — ABNORMAL LOW (ref 14–54)
AST: 17 U/L (ref 15–41)
Albumin: 3.8 g/dL (ref 3.5–5.0)
Alkaline Phosphatase: 63 U/L (ref 38–126)
Anion gap: 9 (ref 5–15)
BILIRUBIN TOTAL: 0.5 mg/dL (ref 0.3–1.2)
BUN: 15 mg/dL (ref 6–20)
CALCIUM: 9.7 mg/dL (ref 8.9–10.3)
CO2: 24 mmol/L (ref 22–32)
CREATININE: 0.64 mg/dL (ref 0.44–1.00)
Chloride: 102 mmol/L (ref 101–111)
GFR calc Af Amer: >60 mL/min (ref >60)
Glucose, Bld: 119 mg/dL — ABNORMAL HIGH (ref 65–99)
POTASSIUM: 3.6 mmol/L (ref 3.5–5.1)
Sodium: 135 mmol/L (ref 135–145)
Total Protein: 7.4 g/dL (ref 6.5–8.1)

## 2016-06-29 LAB — CBC WITH DIFFERENTIAL/PLATELET
BASOS ABS: 0.1 10*3/uL (ref 0–0.1)
Basophils Relative: 1 %
Eosinophils Absolute: 0.4 10*3/uL (ref 0–0.7)
Eosinophils Relative: 5 %
HEMATOCRIT: 35.5 % (ref 35.0–47.0)
Hemoglobin: 11.9 g/dL — ABNORMAL LOW (ref 12.0–16.0)
LYMPHS ABS: 1.4 10*3/uL (ref 1.0–3.6)
LYMPHS PCT: 20 %
MCH: 30.1 pg (ref 26.0–34.0)
MCHC: 33.6 g/dL (ref 32.0–36.0)
MCV: 89.8 fL (ref 80.0–100.0)
MONO ABS: 0.6 10*3/uL (ref 0.2–0.9)
MONOS PCT: 8 %
NEUTROS ABS: 4.6 10*3/uL (ref 1.4–6.5)
Neutrophils Relative %: 66 %
Platelets: 227 10*3/uL (ref 150–440)
RBC: 3.95 MIL/uL (ref 3.80–5.20)
RDW: 16.1 % — AB (ref 11.5–14.5)
WBC: 7 10*3/uL (ref 3.6–11.0)

## 2016-06-29 MED ORDER — SODIUM CHLORIDE 0.9% FLUSH
10.0000 mL | Freq: Once | INTRAVENOUS | Status: AC
  Administered 2016-06-29: 10 mL via INTRAVENOUS
  Filled 2016-06-29: qty 10

## 2016-06-29 MED ORDER — HEPARIN SOD (PORK) LOCK FLUSH 100 UNIT/ML IV SOLN
500.0000 [IU] | Freq: Once | INTRAVENOUS | Status: AC
  Administered 2016-06-29: 500 [IU] via INTRAVENOUS
  Filled 2016-06-29: qty 5

## 2016-06-29 NOTE — Progress Notes (Signed)
Survivorship Care Plan visit completed.  Treatment summary reviewed and given to patient.  ASCO answers booklet reviewed and given to patient.  CARE program and Cancer Transitions discussed with patient along with other resources cancer center offers to patients and caregivers.  Patient verbalized understanding.    

## 2016-06-29 NOTE — Progress Notes (Signed)
States has good appetite but continues to lose weight. Also continues to have weakness in both upper extremities.

## 2016-06-30 ENCOUNTER — Other Ambulatory Visit: Payer: Self-pay | Admitting: *Deleted

## 2016-06-30 DIAGNOSIS — C210 Malignant neoplasm of anus, unspecified: Secondary | ICD-10-CM

## 2016-07-01 ENCOUNTER — Telehealth: Payer: Self-pay | Admitting: *Deleted

## 2016-07-01 NOTE — Telephone Encounter (Signed)
I spoke with  Gladis Riffle"   and explained the CARE (Cancer Associated Rehab and Exercise class) in more detail. Bethena Roys  will sign an informed consent for the CARE exercise class on  and begin exercising on Tuesday and Thursdays from 12:15pm to 1:30pm (or whatever she can do). I emphasized to Bethena Roys that she only has to attend the CARE exercise class when she feels well but hopefully this will help her with any fatigue etc.Daena will plan on starting this Tuesday at 12noon July 06, 2016

## 2016-07-02 ENCOUNTER — Ambulatory Visit
Admission: RE | Admit: 2016-07-02 | Discharge: 2016-07-02 | Disposition: A | Discharge status: Home / Self Care | Payer: Medicare PPO | Source: Ambulatory Visit | Attending: Oncology | Admitting: Oncology

## 2016-07-02 DIAGNOSIS — Z87891 Personal history of nicotine dependence: Secondary | ICD-10-CM | POA: Diagnosis not present

## 2016-07-02 DIAGNOSIS — K8020 Calculus of gallbladder without cholecystitis without obstruction: Secondary | ICD-10-CM | POA: Diagnosis not present

## 2016-07-02 DIAGNOSIS — R918 Other nonspecific abnormal finding of lung field: Secondary | ICD-10-CM | POA: Diagnosis not present

## 2016-07-02 DIAGNOSIS — R590 Localized enlarged lymph nodes: Secondary | ICD-10-CM | POA: Diagnosis not present

## 2016-07-02 DIAGNOSIS — I700 Atherosclerosis of aorta: Secondary | ICD-10-CM | POA: Diagnosis not present

## 2016-07-02 DIAGNOSIS — I313 Pericardial effusion (noninflammatory): Secondary | ICD-10-CM | POA: Insufficient documentation

## 2016-07-02 DIAGNOSIS — K5730 Diverticulosis of large intestine without perforation or abscess without bleeding: Secondary | ICD-10-CM | POA: Insufficient documentation

## 2016-07-02 DIAGNOSIS — C210 Malignant neoplasm of anus, unspecified: Secondary | ICD-10-CM | POA: Insufficient documentation

## 2016-07-02 DIAGNOSIS — I77811 Abdominal aortic ectasia: Secondary | ICD-10-CM | POA: Insufficient documentation

## 2016-07-02 DIAGNOSIS — J439 Emphysema, unspecified: Secondary | ICD-10-CM | POA: Diagnosis not present

## 2016-07-02 MED ORDER — IOPAMIDOL (ISOVUE-300) INJECTION 61%
85.0000 mL | Freq: Once | INTRAVENOUS | Status: AC | PRN
  Administered 2016-07-02: 85 mL via INTRAVENOUS

## 2016-07-05 ENCOUNTER — Ambulatory Visit: Payer: Medicare PPO | Admitting: Surgery

## 2016-07-07 ENCOUNTER — Other Ambulatory Visit: Payer: Self-pay | Admitting: *Deleted

## 2016-07-07 ENCOUNTER — Inpatient Hospital Stay (HOSPITAL_BASED_OUTPATIENT_CLINIC_OR_DEPARTMENT_OTHER): Payer: Medicare PPO | Admitting: Obstetrics and Gynecology

## 2016-07-07 VITALS — BP 150/75 | HR 97 | Temp 96.4°F | Wt 139.3 lb

## 2016-07-07 DIAGNOSIS — Z9221 Personal history of antineoplastic chemotherapy: Secondary | ICD-10-CM

## 2016-07-07 DIAGNOSIS — F17210 Nicotine dependence, cigarettes, uncomplicated: Secondary | ICD-10-CM

## 2016-07-07 DIAGNOSIS — A630 Anogenital (venereal) warts: Secondary | ICD-10-CM | POA: Diagnosis not present

## 2016-07-07 DIAGNOSIS — C210 Malignant neoplasm of anus, unspecified: Secondary | ICD-10-CM | POA: Diagnosis not present

## 2016-07-07 DIAGNOSIS — K632 Fistula of intestine: Secondary | ICD-10-CM

## 2016-07-07 DIAGNOSIS — Z923 Personal history of irradiation: Secondary | ICD-10-CM | POA: Diagnosis not present

## 2016-07-07 DIAGNOSIS — Z86010 Personal history of colonic polyps: Secondary | ICD-10-CM

## 2016-07-07 MED ORDER — ONDANSETRON HCL 8 MG PO TABS: 8.0000 mg | ORAL_TABLET | Freq: Three times a day (TID) | ORAL | 2 refills | Status: DC | PRN

## 2016-07-07 NOTE — Progress Notes (Signed)
Gynecologic Oncology Consult Visit   Referring Provider: Dr. Baruch Gouty  Chief Concern: Anal cancer.  HR HPV+ PAP  Subjective:  Carol Gay is a 70 y.o. female who is seen in consultation from Dr. Grayland Ormond and Chrystal initially in 3/17 to rule out rectovaginal fistula in context of newly diagnosed anal squamous cell cancer.  No fistula was noted and vagina and cervix were normal. She underwent chemoradiation and recent CT shows no evidence of disease.      Returns for evaluation of 3/17 HR HPV+ (PAP normal).  She has atypical inflammatory changes in lungs and is former heavy smoker and uses supplemental oxygen.  No gyn complaints.  Stopped smoking.   Cancer History Patient is a 70 year old female with a several month history of intermittent anorectal pain and slightbleeding which she initially attributed to hemorrhoids. She was found by Dr. Perrin Maltese in general surgery to have a mass in the anal canal and was set up for biopsy as well as lower endoscopy. At the time of endoscopy a frond-like villous nonobstructing mass was found in distal rectum. There is also noted to be a "fistula". Colonoscopy:  - One 4 mm polyp in the transverse colon, removed with a cold snare. Resected and retrieved. - Two 4 to 5 mm polyps at the recto-sigmoid colon, removed with a cold snare. Resected and retrieved. - Tumor in the rectum. Biopsied. - Colonic fistula. - Diverticulosis in the sigmoid colon.  Biopsy was positive for squamous cell carcinoma. CT scan demonstrated a soft tissue fullness of the anal rectal junction again suspicious for newly diagnosed rectal cancer. No evidence of metastatic disease inguinal adenopathy or liver metastasis was noted. Patient has scheduled an MRI scan for further delineation of vaginal involvement. PET CT scan 3/3 showed the hypermetabolic anal mass, but no evidence of metastatic disease.    Smoked one PPD cigarettes, and stopped in July 2017.  Underwent chemo/RT treatment  with Drs Grayland Ormond and Chrystal at [facility].    Problem List: Patient Active Problem List   Diagnosis Date Noted  . HPV (human papilloma virus) anogenital infection 07/07/2016  . Edema extremities 05/05/2016  . Weakness 02/08/2016  . 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 Blood in stool   . Benign neoplasm of transverse colon   . Benign neoplasm of rectosigmoid junction   . Fistula of intestine, excluding rectum and anus   . Pulmonary emphysema ([place]) 10/24/2015  . Encounter for long-term (current) use of medications 02/28/2015  . Aortic atherosclerosis ([place]) 12/30/2014  . CAD in native artery 12/30/2014  . DDD (degenerative disc disease), lumbar 12/30/2014  . Dyslipidemia 12/30/2014  . Essential (primary) hypertension 12/30/2014  . Dermatophytoses 12/30/2014  . Compulsive tobacco user syndrome 12/30/2014    Past Medical History: Past Medical History:  Diagnosis Date  . Aortic atherosclerosis ([place])   . Arm pain   . Asthma   . CAD in native artery   . Cancer ([place]) 09/2015   rectum  . Cigarette smoker   . Claustrophobia   . 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 COPD (chronic obstructive pulmonary disease) ([place])   . DDD (degenerative disc disease), lumbar       . Dermatophytoses   . Dyslipidemia   . H/O blood clots    left leg, veins stripped  . Hemorrhoids   . Hypertension   . Last menstrual period (LMP) > 10 days ago 1994  . Myocardial infarction    "  mild" - age 69  . Pulmonary emphysema ([place])   . Rectal bleeding   . Shortness of breath dyspnea   . Smokers' cough ([place])   . Spinal headache    with one of my children's birth  . Vertigo   . Wears dentures    has full upper and lower - doesn't wear    Past Surgical History: Past Surgical History:  Procedure Laterality Date  . APPENDECTOMY    . BREAST BIOPSY Left 1970's  . COLONOSCOPY WITH PROPOFOL N/A 11/06/2015   Procedure: COLONOSCOPY WITH PROPOFOL;  Surgeon: Lucilla Lame, MD;  Location: [facility];  Service: Endoscopy;  Laterality: N/A;  LEAVE PT EARLY  . EXPLORATORY LAPAROTOMY    . LESION EXCISION  11/03/15   Anal - Dr Dahlia Byes, [facility]  . LESION REMOVAL  1970's   from tailbone  . [place] SURGERY  1997  . OVARIAN CYST REMOVAL Left   . POLYPECTOMY  11/06/2015   Procedure: POLYPECTOMY;  Surgeon: Lucilla Lame, MD;  Location: [facility];  Service: Endoscopy;;  . PORTACATH PLACEMENT N/A 12/10/2015   Procedure: INSERTION PORT-A-CATH;  Surgeon: Jules Husbands, MD;  Location: [facility];  Service: General;  Laterality: N/A;  . RECTAL EXAM UNDER ANESTHESIA N/A 06/23/2016   Procedure: RECTAL EXAM UNDER ANESTHESIA;  Surgeon: Jules Husbands, MD;  Location: [facility];  Service: General;  Laterality: N/A;  . VEIN LIGATION AND STRIPPING      OB History:  NSVD x 3  Family History: Family History  Problem Relation Age of Onset  . Cancer Mother     lymphoma  . Heart disease Mother   . Cancer Maternal Grandmother     breast  . Birth defects Maternal Grandmother     Social History: Social History   Social History  . Marital status: Widowed    Spouse name: N/A  . Number of children: N/A  . Years of education: N/A   Occupational History  . Not on file.   Social History Main Topics  . Smoking status: Current Some Day Smoker    Packs/day: 1.00    Years: 57.00    Types: Cigarettes    Last attempt to quit: 03/30/2016  . Smokeless tobacco: Never Used     Comment: not quite ready  . Alcohol use No  . Drug use: No  . Sexual activity: Not on file   Other Topics Concern  . Not on file   Social History Narrative  . No narrative on file    Allergies: Allergies  Allergen Reactions  . Aspirin Nausea And Vomiting  . Codeine Nausea And Vomiting  . Morphine And Related Nausea And Vomiting    Current Medications: Current Outpatient Prescriptions  Medication Sig Dispense Refill  . acetaminophen (TYLENOL) 500 MG tablet Take 1,000 mg by  mouth every 6 (six) hours as needed.    Marland Kitchen albuterol (PROVENTIL HFA;VENTOLIN HFA) 108 (90 Base) MCG/ACT inhaler Inhale 2 puffs into the lungs every 6 (six) hours as needed for wheezing or shortness of breath. 3 Inhaler 1  . ALPRAZolam (XANAX) 0.25 MG tablet Take 1 tablet (0.25 mg total) by mouth 2 (two) times daily as needed for anxiety. 60 tablet 0  . ANORO ELLIPTA 62.5-25 MCG/INH AEPB INHALE 1 PUFF INTO THE LUNGS DAILY. 180 each 1  . atorvastatin (LIPITOR) 20 MG tablet TAKE 1 TABLET AT BEDTIME 90 tablet 1  . furosemide (LASIX) 20 MG tablet Take 1 tablet (20 mg total) by  mouth daily. 30 tablet 3  . lidocaine-prilocaine (EMLA) cream Apply 1 application topically as needed. 30 g 3  . loperamide (IMODIUM) 2 MG capsule Take 1 capsule (2 mg total) by mouth every 6 (six) hours as needed for diarrhea or loose stools. 30 capsule 0  . OXYGEN Inhale 2.5 L/min into the lungs continuous.     . prochlorperazine (COMPAZINE) 10 MG tablet Take 1 tablet (10 mg total) by mouth every 6 (six) hours as needed for nausea or vomiting. 30 tablet 1  . traMADol-acetaminophen (ULTRACET) 37.5-325 MG tablet Take 1-2 tablets by mouth every 4 (four) hours as needed for moderate pain. 30 tablet 0  . vitamin B-12 (CYANOCOBALAMIN) 500 MCG tablet Take 500 mcg by mouth daily.     No current facility-administered medications for this visit.     Review of Systems General: negative for, fevers, chills, fatigue, changes in sleep, changes in weight or appetite Skin: negative for changes in color, texture, moles or lesions Eyes: negative for, changes in vision, pain, diplopia HEENT: negative for, change in hearing, pain, discharge, tinnitus, vertigo, voice changes, sore throat, neck masses Breasts: negative for breast lumps Pulmonary: negative for, orthopnea, productive cough Cardiac: negative for, palpitations, syncope, pain, discomfort, pressure Gastrointestinal: negative for, dysphagia, nausea, vomiting, jaundice, pain,  constipation, diarrhea, hematemesis, hematochezia Genitourinary/Sexual: negative for, dysuria, discharge, hesitancy, nocturia, retention, stones, infections, STD's, incontinence Ob/Gyn: negative for, irregular bleeding, pain Musculoskeletal: negative for, pain, stiffness, swelling, range of motion limitation Hematology: negative for, easy bruising, bleeding Neurologic/Psych: negative for, headaches, seizures, paralysis, weakness, tremor, change in gait, change in sensation, mood swings, depression, anxiety, change in memory  Objective:  Physical Examination:  BP (!) 150/75 (BP Location: Right Arm, Patient Position: Sitting)   Pulse 97   Temp (!) 96.4 F (35.8 C) (Tympanic)   Wt 139 lb 5.3 oz (63.2 kg)   BMI 25.48 kg/m    ECOG Performance Status: 2 - Symptomatic, <50% confined to bed  General appearance: alert, cooperative and appears stated age HEENT:PERRLA, neck supple with midline trachea and thyroid without masses Lymph node survey: non-palpable, axillary, inguinal, supraclavicular Cardiovascular: regular rate and rhythm Respiratory: lungs clear to auscultation Abdomen: soft, non-tender, without masses or organomegaly, no hernias and well healed incision Back: inspection of back is normal Extremities: extremities normal, atraumatic, no cyanosis or edema Skin exam - normal coloration and turgor, no rashes, no suspicious skin lesions noted. Neurological exam reveals alert, oriented, normal speech, no focal findings or movement disorder noted.  Pelvic: exam chaperoned by nurse,  EGBUS/Vagina: atrophic.  No lesions.  Normal cervix without lesions, polyps or tenderness, It is flush with the vault.  Adnexa: normal adnexa in size, nontender and no masses; Uterus: uterus is normal size, shape, consistency and nontender; On rectal there is slight irregularity at 12 o'clock.  Feels like scar.   Colposcopy: time out performed.  vagina and cervix atrophic post radiation.  Cervix flush with  vaginal vault.  Transformation zone not visible, but no lesions noted. ECC could not be performed due to stenotic os.     Assessment:  Anal squamous cell cancer diagnosed 3/17 s/p chemoradiation with complete response. NED  HR HPV+ 3/17 (normal PAP) with no bleeding or discharge and no cervical or vaginal lesion seen on colposcopy today.  ECC not done because cervix atrophic and os stenotic.   Medical co-morbidities complicating care:  Oxygen requirement  Plan:   Problem List Items Addressed This Visit      Digestive   Squamous  cell carcinoma of anus (HCC) - Primary     Other   HPV (human papilloma virus) anogenital infection    Other Visit Diagnoses   None.    She will follow up with Drs. Chrystal and Finnegan.  I informed her that the CT scan did not show any evidence of local recurrence of anal cancer.  She is supposed to have anoscopy under anesthesia, but does not want to go through with this.  I spoke to Dr Grayland Ormond and he said this was fine.  She will see him for follow up as scheduled.     Although colposcopy was negative the transformation zone was not seen at all.  Guidelines state that she should have ECC, but she does not have any bleeding or discharge and ECC is not technically not possible in clinic.  She will return in one year for PAP/HPV co-testing.    Mellody Drown, MD  CC:  Glean Hess, [address], [place] [address] [phone]

## 2016-07-07 NOTE — Progress Notes (Signed)
  Oncology Nurse Navigator Documentation Informed consent obtained for colposcopy. Chaperoned colpo and pelvic exam. Follow up in one year for co-testing. Reports compazine is not working for nausea.Requesting the previous medication she was taking for nausea. Message sent to Dr.Finnegans nurse regarding request. Navigator Location: [facility] (07/07/16 1100)   )Navigator Encounter Type: Other (Gyn Onc) (07/07/16 1100)                     Patient Visit Type: GynOnc (07/07/16 1100)                              Time Spent with Patient: 45 (07/07/16 1100)

## 2016-07-26 ENCOUNTER — Other Ambulatory Visit: Payer: Self-pay | Admitting: *Deleted

## 2016-07-26 MED ORDER — TRAMADOL-ACETAMINOPHEN 37.5-325 MG PO TABS: 1.0000 | ORAL_TABLET | ORAL | 0 refills | Status: DC | PRN

## 2016-08-02 ENCOUNTER — Telehealth: Payer: Self-pay | Admitting: *Deleted

## 2016-08-02 ENCOUNTER — Telehealth: Payer: Self-pay

## 2016-08-02 ENCOUNTER — Other Ambulatory Visit: Payer: Self-pay | Admitting: Internal Medicine

## 2016-08-02 DIAGNOSIS — C210 Malignant neoplasm of anus, unspecified: Secondary | ICD-10-CM

## 2016-08-02 MED ORDER — ALPRAZOLAM 0.25 MG PO TABS: 0.2500 mg | ORAL_TABLET | Freq: Two times a day (BID) | ORAL | 0 refills | Status: DC | PRN

## 2016-08-02 MED ORDER — ONDANSETRON HCL 8 MG PO TABS: 8.0000 mg | ORAL_TABLET | Freq: Three times a day (TID) | ORAL | 2 refills | Status: DC | PRN

## 2016-08-02 NOTE — Telephone Encounter (Signed)
Patient was asked by [facility] to get Tramadol and Xanax refills through PCP now. I advised her that we do not do chronic narcotics. She said that even though cancer is resolved it does help her sleep better.  Per Dr. Army Melia ok to come in for OV and wing off meds but will not be chronic meds.  Patient has decided to use what she has 4- Xanax and 12 Tramadol and wing herself off of the meds.

## 2016-08-02 NOTE — Telephone Encounter (Signed)
Advised patient that prescriptions were filled but in the future she would need to have her PCP refill medications. Patient verbalized understanding.

## 2016-08-04 ENCOUNTER — Encounter: Payer: Self-pay | Admitting: Radiation Oncology

## 2016-08-04 ENCOUNTER — Ambulatory Visit
Admission: RE | Admit: 2016-08-04 | Discharge: 2016-08-04 | Disposition: A | Discharge status: Home / Self Care | Payer: Medicare PPO | Source: Ambulatory Visit | Attending: Radiation Oncology | Admitting: Radiation Oncology

## 2016-08-04 VITALS — BP 153/84 | HR 99 | Temp 96.8°F | Wt 137.5 lb

## 2016-08-04 DIAGNOSIS — R634 Abnormal weight loss: Secondary | ICD-10-CM | POA: Insufficient documentation

## 2016-08-04 DIAGNOSIS — Z923 Personal history of irradiation: Secondary | ICD-10-CM | POA: Diagnosis not present

## 2016-08-04 DIAGNOSIS — Z85048 Personal history of other malignant neoplasm of rectum, rectosigmoid junction, and anus: Secondary | ICD-10-CM | POA: Diagnosis present

## 2016-08-04 DIAGNOSIS — R918 Other nonspecific abnormal finding of lung field: Secondary | ICD-10-CM | POA: Diagnosis not present

## 2016-08-04 DIAGNOSIS — Z9221 Personal history of antineoplastic chemotherapy: Secondary | ICD-10-CM | POA: Insufficient documentation

## 2016-08-04 DIAGNOSIS — C210 Malignant neoplasm of anus, unspecified: Secondary | ICD-10-CM

## 2016-08-04 NOTE — Progress Notes (Signed)
Radiation Oncology Follow up Note  Name: Carol Gay   Date:   08/04/2016 MRN:  [id number] DOB: 09-26-1945    This 70 y.o. female presents to the clinic today for 5 month follow-up status post concurrent chemoradiation for stage IIIa squamous cell carcinoma the anus.  REFERRING PROVIDER: Glean Hess, MD  HPI: Patient is a 70 year old female now out I've months having completed concurrent chemoradiation for squamous cell carcinoma the anus stage IIIa (T4 NX M0).. She is seen today in routine follow-up and is doing well. Having no rectal pain at this time she continues to lose weight of unknown etiology. CT scan performed in October showed some patchy perilobular consolidation and groundglass opacity throughout both lungs which may contribute to some of her weight loss. CT scan of the anus showed no discrete anal mass and no evidence of disease. She has stopped smoking.  COMPLICATIONS OF TREATMENT: none  FOLLOW UP COMPLIANCE: keeps appointments   PHYSICAL EXAM:  BP (!) 153/84   Pulse 99   Temp (!) 96.8 F (36 C)   Wt 137 lb 7.3 oz (62.4 kg)   BMI 25.14 kg/m  On anal examination no evidence of mass or nodularity is noted. Rectal exam is unremarkable. No inguinal adenopathy is identified. Well-developed well-nourished patient in NAD. HEENT reveals PERLA, EOMI, discs not visualized.  Oral cavity is clear. No oral mucosal lesions are identified. Neck is clear without evidence of cervical or supraclavicular adenopathy. Lungs are clear to A&P. Cardiac examination is essentially unremarkable with regular rate and rhythm without murmur rub or thrill. Abdomen is benign with no organomegaly or masses noted. Motor sensory and DTR levels are equal and symmetric in the upper and lower extremities. Cranial nerves II through XII are grossly intact. Proprioception is intact. No peripheral adenopathy or edema is identified. No motor or sensory levels are noted. Crude visual fields are within normal  range.  RADIOLOGY RESULTS: CT scan of chest abdomen and pelvis reviewed and compatible with the above-stated findings  PLAN: Present time patient has no evidence of disease a sparse anal cancer is concerned. She's being followed by medical oncology for her lung findings which are being followed. I'm otherwise please were overall progress. I've asked to see around 6 months for follow-up. She's ordered and scheduled for follow-up endoscopy. Patient is to call with any concerns.  I would like to take this opportunity to thank you for allowing me to participate in the care of your patient.Armstead Peaks., MD

## 2016-08-10 ENCOUNTER — Inpatient Hospital Stay: Payer: Medicare PPO

## 2016-08-17 ENCOUNTER — Inpatient Hospital Stay: Payer: Medicare PPO | Attending: Oncology

## 2016-09-03 ENCOUNTER — Other Ambulatory Visit: Payer: Self-pay | Admitting: Internal Medicine

## 2016-09-03 ENCOUNTER — Telehealth: Payer: Self-pay | Admitting: Internal Medicine

## 2016-09-03 DIAGNOSIS — J439 Emphysema, unspecified: Secondary | ICD-10-CM

## 2016-09-03 MED ORDER — ALBUTEROL SULFATE HFA 108 (90 BASE) MCG/ACT IN AERS: 2.0000 | INHALATION_SPRAY | Freq: Four times a day (QID) | RESPIRATORY_TRACT | 1 refills | Status: DC | PRN

## 2016-09-03 NOTE — Telephone Encounter (Signed)
Pt called need refill Rx Albuterol.

## 2016-09-21 IMAGING — CT NM PET TUM IMG INITIAL (PI) SKULL BASE T - THIGH
1 of 10 series · 1 of 25 positions shown · non-contrast
Comparison: 11/11/2015 CT abdomen/ pelvis.

CLINICAL DATA: Initial treatment strategy for recently diagnosed
anal squamous cell carcinoma with vaginal fistula on exam, at least
stage IIIA (T4 NX M0).

EXAM:
NUCLEAR MEDICINE PET SKULL BASE TO THIGH
TECHNIQUE: 11.9 mCi F-18 FDG was injected intravenously. Full-ring PET imaging
was performed from the skull base to thigh after the radiotracer. CT
data was obtained and used for attenuation correction and anatomic
localization.
FASTING BLOOD GLUCOSE:  Value: 75 mg/dl

[Series 3: ct wb 5.0 b30f · axial · 5.0mm · 0.98mm/px · 1 of 290 slices shown]
[im 290/290  brain]
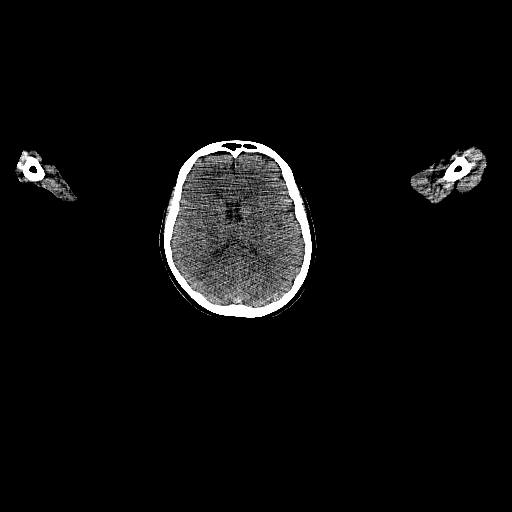

[1 of 25 positions shown; findings below may reference images not displayed]

FINDINGS: NECK

No hypermetabolic lymph nodes in the neck. Relatively symmetric
uptake in the glottis is probably physiologic.

CHEST

No hypermetabolic axillary, mediastinal or hilar nodes. Coarsely
calcified granulomatous left hilar node. Atherosclerotic
nonaneurysmal thoracic aorta. Dilated main pulmonary artery (3.3 cm
diameter). Mild-to-moderate centrilobular emphysema and diffuse
bronchial wall thickening. No acute consolidative airspace disease,
significant pulmonary nodules or lung masses. Subpleural
reticulation throughout both lungs is nonspecific and not
appreciably changed.

ABDOMEN/PELVIS

Hypermetabolic anal mass measuring approximately 3.9 x 3.1 cm
(series 3/image 249) with max SUV 15.8.

No hypermetabolic inguinal or other pelvic lymph nodes. No
hypermetabolic abdominal lymph nodes.

No abnormal hypermetabolic activity within the liver, pancreas,
adrenal glands, or spleen. Ectatic atherosclerotic infrarenal
abdominal aorta, maximum diameter 2.6 cm. Mild distal colonic
diverticulosis.

SKELETON

No focal hypermetabolic activity to suggest skeletal metastasis.
IMPRESSION: 1. Hypermetabolic anal mass, in keeping with primary anal carcinoma.
2. No hypermetabolic locoregional nodal, liver or distant
metastases.
3. Mild to moderate emphysema and diffuse bronchial wall thickening,
suggesting COPD.
4. Dilated main pulmonary artery, suggesting pulmonary arterial
hypertension.
5. Subpleural reticulation throughout both lungs, nonspecific,
cannot exclude interstitial lung disease. Consider correlation with
high-resolution chest CT in 6-12 months if clinically warranted.

## 2016-09-29 ENCOUNTER — Ambulatory Visit: Payer: Self-pay | Admitting: Oncology

## 2016-09-29 ENCOUNTER — Other Ambulatory Visit: Payer: Self-pay

## 2016-10-03 NOTE — Progress Notes (Addendum)
[facility]  [phone]  [phone]) [phone]     Carol Gay DOB: 03-10-1946  MR#: [id number]  MGQ#:[id number]  Patient Care Team: Glean Hess, MD as PCP - General (Family Medicine) Clent Jacks, RN as Registered Nurse Lucilla Lame, MD as Consulting Physician (Gastroenterology)  CHIEF COMPLAINT:  Squamous cell carcinoma of the anus, stage II, T2 N0 M0.  INTERVAL HISTORY:   Patient returns to clinic today for routine 3 month evaluation. She continues to feel fatigued, denies weakness. She has a good appetite, and her weight is stable.  She reports new onset dry mouth for past 2 months, drinking water and coffee daily.  She has no neurologic complaints. She denies any recent fevers, chills, or illnesses.   She uses 2L of O2 2-3 times per week for 2-3 hours at a time when moving around, cleaning the house.  She reports recent shortness of breath at grocery store, when she did not have her oxygen with her.  She used her inhaler until she could get home.  She denies any chest pain, cough, or hemoptysis.  She has no abdominal pain. She denies any nausea, vomiting, constipation, or diarrhea.  She report fresh blood on toilet paper when wiping after 2-3 BM's in a day, when her "butt feels raw".  She has been wiping with warm washcloth and using zinc oxide paste, which has not provided much relief lately.  She has no melena. She has no urinary complaints. Patient offers no further specific complaints today.  REVIEW OF SYSTEMS:   Review of Systems  Constitutional: Positive for malaise/fatigue. Negative for fever and weight loss.  HENT: Negative.   Eyes: Negative.   Respiratory: Positive for shortness of breath. Negative for cough.   Cardiovascular: Negative for chest pain and leg swelling.  Gastrointestinal: Negative for abdominal pain, blood in stool, constipation, diarrhea, melena, nausea and vomiting.       Frank blood on toilet paper after 2-3 BM's in a day    Genitourinary: Negative.   Musculoskeletal: Negative.   Neurological: Negative for dizziness, weakness and headaches.  Endo/Heme/Allergies: Does not bruise/bleed easily.  Psychiatric/Behavioral: Negative.  The patient is not nervous/anxious.     As per HPI. Otherwise, a complete review of systems is negative.   PAST MEDICAL HISTORY: Past Medical History:  Diagnosis Date  . Aortic atherosclerosis ([place])   . Arm pain   . Asthma   . CAD in native artery   . Cancer ([place]) 09/2015   rectum  . Cigarette smoker   . Claustrophobia   . 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 COPD (chronic obstructive pulmonary disease) ([place])   . DDD (degenerative disc disease), lumbar       . Dermatophytoses   . Dyslipidemia   . H/O blood clots    left leg, veins stripped  . Hemorrhoids   . Hypertension   . Last menstrual period (LMP) > 10 days ago 1994  . Myocardial infarction    "mild" - age 71  . Pulmonary emphysema ([place])   . Rectal bleeding   . Shortness of breath dyspnea   . Smokers' cough ([place])   . Spinal headache    with one of my children's birth  . Vertigo   . Wears dentures    has full upper and lower - doesn't wear    PAST SURGICAL HISTORY: Past Surgical History:  Procedure Laterality Date  . APPENDECTOMY    .  BREAST BIOPSY Left 1970's  . COLONOSCOPY WITH PROPOFOL N/A 11/06/2015   Procedure: COLONOSCOPY WITH PROPOFOL;  Surgeon: Lucilla Lame, MD;  Location: [place];  Service: Endoscopy;  Laterality: N/A;  LEAVE PT EARLY  . EXPLORATORY LAPAROTOMY    . LESION EXCISION  11/03/15   Anal - Dr Dahlia Byes, [facility]  . LESION REMOVAL  1970's   from tailbone  . [place] SURGERY  1997  . OVARIAN CYST REMOVAL Left   . POLYPECTOMY  11/06/2015   Procedure: POLYPECTOMY;  Surgeon: Lucilla Lame, MD;  Location: [place];  Service: Endoscopy;;  . PORTACATH PLACEMENT N/A 12/10/2015   Procedure: INSERTION PORT-A-CATH;  Surgeon: Jules Husbands, MD;   Location: [facility];  Service: General;  Laterality: N/A;  . RECTAL EXAM UNDER ANESTHESIA N/A 06/23/2016   Procedure: RECTAL EXAM UNDER ANESTHESIA;  Surgeon: Jules Husbands, MD;  Location: [facility];  Service: General;  Laterality: N/A;  . VEIN LIGATION AND STRIPPING      FAMILY HISTORY Family History  Problem Relation Age of Onset  . Cancer Mother     lymphoma  . Heart disease Mother   . Cancer Maternal Grandmother     breast  . Birth defects Maternal Grandmother     GYNECOLOGIC HISTORY:  No LMP recorded. Patient is postmenopausal.     ADVANCED DIRECTIVES:    HEALTH MAINTENANCE: Social History  Substance Use Topics  . Smoking status: Current Some Day Smoker    Packs/day: 1.00    Years: 57.00    Types: Cigarettes    Last attempt to quit: 03/30/2016  . Smokeless tobacco: Never Used     Comment: not quite ready  . Alcohol use No     Allergies  Allergen Reactions  . Aspirin Nausea And Vomiting  . Codeine Nausea And Vomiting  . Morphine And Related Nausea And Vomiting    Current Outpatient Prescriptions  Medication Sig Dispense Refill  . acetaminophen (TYLENOL) 500 MG tablet Take 1,000 mg by mouth every 6 (six) hours as needed.    Marland Kitchen albuterol (PROVENTIL HFA;VENTOLIN HFA) 108 (90 Base) MCG/ACT inhaler Inhale 2 puffs into the lungs every 6 (six) hours as needed for wheezing or shortness of breath. 3 Inhaler 1  . ALPRAZolam (XANAX) 0.25 MG tablet Take 1 tablet (0.25 mg total) by mouth 2 (two) times daily as needed for anxiety. 60 tablet 0  . amLODipine (NORVASC) 5 MG tablet TAKE 1 TABLET EVERY DAY 90 tablet 1  . ANORO ELLIPTA 62.5-25 MCG/INH AEPB INHALE 1 PUFF INTO THE LUNGS DAILY. 180 each 1  . atorvastatin (LIPITOR) 20 MG tablet TAKE 1 TABLET AT BEDTIME 90 tablet 1  . furosemide (LASIX) 20 MG tablet Take 1 tablet (20 mg total) by mouth daily. 30 tablet 3  . lidocaine-prilocaine (EMLA) cream Apply 1 application topically as needed. 30 g 3  . loperamide (IMODIUM) 2 MG  capsule Take 1 capsule (2 mg total) by mouth every 6 (six) hours as needed for diarrhea or loose stools. 30 capsule 0  . ondansetron (ZOFRAN) 8 MG tablet Take 1 tablet (8 mg total) by mouth every 8 (eight) hours as needed for nausea or vomiting. 30 tablet 2  . OXYGEN Inhale 2.5 L/min into the lungs continuous.     . prochlorperazine (COMPAZINE) 10 MG tablet Take 1 tablet (10 mg total) by mouth every 6 (six) hours as needed for nausea or vomiting. 30 tablet 1  . traMADol-acetaminophen (ULTRACET) 37.5-325 MG tablet Take 1-2 tablets  by mouth every 4 (four) hours as needed for moderate pain. 30 tablet 0  . vitamin B-12 (CYANOCOBALAMIN) 500 MCG tablet Take 500 mcg by mouth daily.    . hydrOXYzine (ATARAX/VISTARIL) 50 MG tablet Take 50 mg by mouth daily.     No current facility-administered medications for this visit.     OBJECTIVE: BP 130/77 (BP Location: Right Arm, Patient Position: Sitting)   Pulse 87   Temp 97.8 F (36.6 C) (Tympanic)   Resp 18   Wt 139 lb 8.8 oz (63.3 kg)   BMI 25.52 kg/m    Body mass index is 25.52 kg/m.    ECOG FS:0 - Asymptomatic  General: Well-developed, well-nourished, no acute distress. Eyes: Pink conjunctiva, anicteric sclera. Lungs: Clear to auscultation bilaterally. Heart: Regular rate and rhythm. No rubs, murmurs, or gallops. Abdomen: Soft, nontender, nondistended. No organomegaly noted, normoactive bowel sounds. Musculoskeletal: mild nonpitting edema bilateral feet.                 Neuro: Alert, answering all questions appropriately. Cranial nerves grossly intact. Skin: No rashes or petechiae noted. Psych: Normal affect.   LAB RESULTS:  Infusion on 10/04/2016  Component Date Value Ref Range Status  . WBC 10/04/2016 8.5  3.6 - 11.0 K/uL Final  . RBC 10/04/2016 4.69  3.80 - 5.20 MIL/uL Final  . Hemoglobin 10/04/2016 13.6  12.0 - 16.0 g/dL Final  . HCT 10/04/2016 41.4  35.0 - 47.0 % Final  . MCV 10/04/2016 88.2  80.0 - 100.0 fL Final  . MCH 10/04/2016  29.0  26.0 - 34.0 pg Final  . MCHC 10/04/2016 32.9  32.0 - 36.0 g/dL Final  . RDW 10/04/2016 16.7* 11.5 - 14.5 % Final  . Platelets 10/04/2016 225  150 - 440 K/uL Final  . Neutrophils Relative % 10/04/2016 70  % Final  . Neutro Abs 10/04/2016 5.9  1.4 - 6.5 K/uL Final  . Lymphocytes Relative 10/04/2016 17  % Final  . Lymphs Abs 10/04/2016 1.5  1.0 - 3.6 K/uL Final  . Monocytes Relative 10/04/2016 7  % Final  . Monocytes Absolute 10/04/2016 0.6  0.2 - 0.9 K/uL Final  . Eosinophils Relative 10/04/2016 4  % Final  . Eosinophils Absolute 10/04/2016 0.3  0 - 0.7 K/uL Final  . Basophils Relative 10/04/2016 2  % Final  . Basophils Absolute 10/04/2016 0.2* 0 - 0.1 K/uL Final  . Sodium 10/04/2016 137  135 - 145 mmol/L Final  . Potassium 10/04/2016 3.8  3.5 - 5.1 mmol/L Final  . Chloride 10/04/2016 107  101 - 111 mmol/L Final  . CO2 10/04/2016 23  22 - 32 mmol/L Final  . Glucose, Bld 10/04/2016 105* 65 - 99 mg/dL Final  . BUN 10/04/2016 16  6 - 20 mg/dL Final  . Creatinine, Ser 10/04/2016 0.62  0.44 - 1.00 mg/dL Final  . Calcium 10/04/2016 9.7  8.9 - 10.3 mg/dL Final  . Total Protein 10/04/2016 7.8  6.5 - 8.1 g/dL Final  . Albumin 10/04/2016 4.0  3.5 - 5.0 g/dL Final  . AST 10/04/2016 18  15 - 41 U/L Final  . ALT 10/04/2016 10* 14 - 54 U/L Final  . Alkaline Phosphatase 10/04/2016 85  38 - 126 U/L Final  . Total Bilirubin 10/04/2016 0.5  0.3 - 1.2 mg/dL Final  . GFR calc non Af Amer 10/04/2016 >60  >60 mL/min Final  . GFR calc Af Amer 10/04/2016 >60  >60 mL/min Final   Comment: (NOTE) The eGFR has  been calculated using the CKD EPI equation. This calculation has not been validated in all clinical situations. eGFR's persistently <60 mL/min signify possible Chronic Kidney Disease.   . Anion gap 10/04/2016 7  5 - 15 Final    STUDIES: No results found.  ASSESSMENT:  Squamous cell carcinoma of the anus, stage II, T2 N0 M0.  PLAN:    1. Squamous cell carcinoma of the anus:Diagnosed in  February 2017.  CT scan results from July 02, 2016 reviewed independently and reported as above. Per NCCN guidelines, patient will require an anoscope every 6-12 months for 3 years. Will also do abdominal and pelvic CT with contrast annually for 3 years.  Guidelines also recommend DRE and inguinal node palpation every 3-6 months for 5 years.  Return to clinic in 6 months with repeat laboratory work and further evaluation.Continued follow-up with GI as scheduled. 2. Bilateral peripheral edema: Currently resolved.  Unlikely related to patient's malignancy or treatment. Continued evaluation per primary care. 3. Weight loss: Possibly due to previous atypical pneumonia.  Weight currently stable.  Monitor.  Patient expressed understanding and was in agreement with this plan. She also understands that She can call clinic at any time with any questions, concerns, or complaints.    Squamous cell carcinoma of anus (HCC)   Staging form: Anus, AJCC 7th Edition     Clinical stage from 11/28/2015: Stage II (T2, N0, M0) - Signed by Lloyd Huger, MD on 11/28/2015   Lucendia Herrlich, NP  10/04/16 3:05 PM   Patient was seen and evaluated independently and I agree with the assessment and plan as dictated above. Patient reports anoscope under anesthesia was not recommended by surgery given her oxygen requirements. Continue follow-up as above.  Lloyd Huger, MD 10/04/16 3:06 PM

## 2016-10-04 ENCOUNTER — Inpatient Hospital Stay: Payer: Medicare PPO

## 2016-10-04 ENCOUNTER — Inpatient Hospital Stay: Payer: Medicare PPO | Attending: Oncology | Admitting: Oncology

## 2016-10-04 VITALS — BP 130/77 | HR 87 | Temp 97.8°F | Resp 18 | Wt 139.6 lb

## 2016-10-04 DIAGNOSIS — R609 Edema, unspecified: Secondary | ICD-10-CM | POA: Insufficient documentation

## 2016-10-04 DIAGNOSIS — R682 Dry mouth, unspecified: Secondary | ICD-10-CM | POA: Diagnosis not present

## 2016-10-04 DIAGNOSIS — M5136 Other intervertebral disc degeneration, lumbar region: Secondary | ICD-10-CM | POA: Diagnosis not present

## 2016-10-04 DIAGNOSIS — R634 Abnormal weight loss: Secondary | ICD-10-CM | POA: Insufficient documentation

## 2016-10-04 DIAGNOSIS — J449 Chronic obstructive pulmonary disease, unspecified: Secondary | ICD-10-CM | POA: Diagnosis not present

## 2016-10-04 DIAGNOSIS — R0602 Shortness of breath: Secondary | ICD-10-CM | POA: Diagnosis not present

## 2016-10-04 DIAGNOSIS — K625 Hemorrhage of anus and rectum: Secondary | ICD-10-CM | POA: Diagnosis not present

## 2016-10-04 DIAGNOSIS — Z807 Family history of other malignant neoplasms of lymphoid, hematopoietic and related tissues: Secondary | ICD-10-CM | POA: Diagnosis not present

## 2016-10-04 DIAGNOSIS — F17210 Nicotine dependence, cigarettes, uncomplicated: Secondary | ICD-10-CM | POA: Diagnosis not present

## 2016-10-04 DIAGNOSIS — M79603 Pain in arm, unspecified: Secondary | ICD-10-CM | POA: Insufficient documentation

## 2016-10-04 DIAGNOSIS — Z9049 Acquired absence of other specified parts of digestive tract: Secondary | ICD-10-CM | POA: Diagnosis not present

## 2016-10-04 DIAGNOSIS — I252 Old myocardial infarction: Secondary | ICD-10-CM

## 2016-10-04 DIAGNOSIS — C210 Malignant neoplasm of anus, unspecified: Secondary | ICD-10-CM | POA: Insufficient documentation

## 2016-10-04 DIAGNOSIS — I2510 Atherosclerotic heart disease of native coronary artery without angina pectoris: Secondary | ICD-10-CM

## 2016-10-04 DIAGNOSIS — Z79899 Other long term (current) drug therapy: Secondary | ICD-10-CM | POA: Diagnosis not present

## 2016-10-04 DIAGNOSIS — R42 Dizziness and giddiness: Secondary | ICD-10-CM | POA: Insufficient documentation

## 2016-10-04 DIAGNOSIS — Q600 Renal agenesis, unilateral: Secondary | ICD-10-CM | POA: Diagnosis not present

## 2016-10-04 DIAGNOSIS — Z8701 Personal history of pneumonia (recurrent): Secondary | ICD-10-CM | POA: Diagnosis not present

## 2016-10-04 DIAGNOSIS — J439 Emphysema, unspecified: Secondary | ICD-10-CM | POA: Diagnosis not present

## 2016-10-04 DIAGNOSIS — I700 Atherosclerosis of aorta: Secondary | ICD-10-CM | POA: Insufficient documentation

## 2016-10-04 DIAGNOSIS — E785 Hyperlipidemia, unspecified: Secondary | ICD-10-CM | POA: Diagnosis not present

## 2016-10-04 DIAGNOSIS — F40240 Claustrophobia: Secondary | ICD-10-CM | POA: Insufficient documentation

## 2016-10-04 DIAGNOSIS — Z95828 Presence of other vascular implants and grafts: Secondary | ICD-10-CM

## 2016-10-04 DIAGNOSIS — R531 Weakness: Secondary | ICD-10-CM | POA: Insufficient documentation

## 2016-10-04 DIAGNOSIS — I10 Essential (primary) hypertension: Secondary | ICD-10-CM | POA: Diagnosis not present

## 2016-10-04 DIAGNOSIS — Z803 Family history of malignant neoplasm of breast: Secondary | ICD-10-CM | POA: Insufficient documentation

## 2016-10-04 LAB — CBC WITH DIFFERENTIAL/PLATELET
BASOS ABS: 0.2 10*3/uL — AB (ref 0–0.1)
BASOS PCT: 2 %
EOS ABS: 0.3 10*3/uL (ref 0–0.7)
Eosinophils Relative: 4 %
HEMATOCRIT: 41.4 % (ref 35.0–47.0)
HEMOGLOBIN: 13.6 g/dL (ref 12.0–16.0)
Lymphocytes Relative: 17 %
Lymphs Abs: 1.5 10*3/uL (ref 1.0–3.6)
MCH: 29 pg (ref 26.0–34.0)
MCHC: 32.9 g/dL (ref 32.0–36.0)
MCV: 88.2 fL (ref 80.0–100.0)
MONOS PCT: 7 %
Monocytes Absolute: 0.6 10*3/uL (ref 0.2–0.9)
NEUTROS PCT: 70 %
Neutro Abs: 5.9 10*3/uL (ref 1.4–6.5)
Platelets: 225 10*3/uL (ref 150–440)
RBC: 4.69 MIL/uL (ref 3.80–5.20)
RDW: 16.7 % — ABNORMAL HIGH (ref 11.5–14.5)
WBC: 8.5 10*3/uL (ref 3.6–11.0)

## 2016-10-04 LAB — COMPREHENSIVE METABOLIC PANEL
ALBUMIN: 4 g/dL (ref 3.5–5.0)
ALT: 10 U/L — ABNORMAL LOW (ref 14–54)
ANION GAP: 7 (ref 5–15)
AST: 18 U/L (ref 15–41)
Alkaline Phosphatase: 85 U/L (ref 38–126)
BILIRUBIN TOTAL: 0.5 mg/dL (ref 0.3–1.2)
BUN: 16 mg/dL (ref 6–20)
CALCIUM: 9.7 mg/dL (ref 8.9–10.3)
CO2: 23 mmol/L (ref 22–32)
Chloride: 107 mmol/L (ref 101–111)
Creatinine, Ser: 0.62 mg/dL (ref 0.44–1.00)
GFR calc Af Amer: >60 mL/min (ref >60)
GFR calc non Af Amer: >60 mL/min (ref >60)
GLUCOSE: 105 mg/dL — AB (ref 65–99)
Potassium: 3.8 mmol/L (ref 3.5–5.1)
SODIUM: 137 mmol/L (ref 135–145)
Total Protein: 7.8 g/dL (ref 6.5–8.1)

## 2016-10-04 MED ORDER — SODIUM CHLORIDE 0.9% FLUSH
10.0000 mL | INTRAVENOUS | Status: DC | PRN
  Administered 2016-10-04: 10 mL via INTRAVENOUS
  Filled 2016-10-04: qty 10

## 2016-10-04 MED ORDER — HEPARIN SOD (PORK) LOCK FLUSH 100 UNIT/ML IV SOLN
500.0000 [IU] | Freq: Once | INTRAVENOUS | Status: AC
  Administered 2016-10-04: 500 [IU] via INTRAVENOUS

## 2016-10-04 NOTE — Progress Notes (Signed)
Patient states when she has a bowel movement she is in a lot of pain.  States she has had two BM's today and she is so sore that it is hard to sit. States she is not constipated.  She also c/o dry mouth.  Recommended Biotene mouthwash to patient.

## 2016-10-15 ENCOUNTER — Other Ambulatory Visit: Payer: Self-pay | Admitting: Internal Medicine

## 2016-11-02 ENCOUNTER — Inpatient Hospital Stay: Payer: Medicare PPO | Attending: Oncology

## 2016-12-28 ENCOUNTER — Encounter: Payer: Self-pay | Admitting: Family Medicine

## 2016-12-29 ENCOUNTER — Ambulatory Visit (INDEPENDENT_AMBULATORY_CARE_PROVIDER_SITE_OTHER): Payer: Medicare HMO | Admitting: Internal Medicine

## 2016-12-29 ENCOUNTER — Encounter: Payer: Self-pay | Admitting: Internal Medicine

## 2016-12-29 VITALS — BP 142/60 | HR 92 | Temp 97.8°F | Ht 62.0 in | Wt 142.2 lb

## 2016-12-29 DIAGNOSIS — J439 Emphysema, unspecified: Secondary | ICD-10-CM

## 2016-12-29 DIAGNOSIS — R1032 Left lower quadrant pain: Secondary | ICD-10-CM

## 2016-12-29 DIAGNOSIS — I10 Essential (primary) hypertension: Secondary | ICD-10-CM | POA: Diagnosis not present

## 2016-12-29 DIAGNOSIS — Z789 Other specified health status: Secondary | ICD-10-CM | POA: Diagnosis not present

## 2016-12-29 DIAGNOSIS — C210 Malignant neoplasm of anus, unspecified: Secondary | ICD-10-CM | POA: Diagnosis not present

## 2016-12-29 MED ORDER — AMLODIPINE BESYLATE 5 MG PO TABS: 5.0000 mg | ORAL_TABLET | Freq: Every day | ORAL | 3 refills | Status: DC

## 2016-12-29 MED ORDER — ALBUTEROL SULFATE HFA 108 (90 BASE) MCG/ACT IN AERS: 2.0000 | INHALATION_SPRAY | Freq: Four times a day (QID) | RESPIRATORY_TRACT | 4 refills | Status: DC | PRN

## 2016-12-29 NOTE — Progress Notes (Signed)
Date:  12/29/2016   Name:  Carol Gay   DOB:  11/26/1945   MRN:  [id number]   Chief Complaint: Edema (swelling at port-a-cath. Would like labs to rule out infection per oncology Doctor. Also need refill on albuterol inhaler. ) and Abdominal Pain (Pain on left side of stomach. Hurts in lower abdomen. Started 2 weeks ago. ) Abdominal Pain  This is a new problem. The current episode started in the past 7 days. The onset quality is undetermined. The problem has been unchanged. The pain is located in the LLQ. The pain is mild. The quality of the pain is a sensation of fullness and dull. The abdominal pain does not radiate. Associated symptoms include diarrhea (occasional loose stools relieved by immodium). Pertinent negatives include no constipation, dysuria, fever, frequency or vomiting. She has tried nothing for the symptoms.  Hypertension  Associated symptoms include shortness of breath. Pertinent negatives include no chest pain or palpitations.  Port-a-cath problem - patient is here to evaluate a potential problem with her Port-A-Cath. She is a [place] was flush was about 6 months ago. She completed her chemotherapy and radiation 11 months ago for rectal carcinoma. She doesn't anticipate using the catheter in the future. Recently it seems to pole at her neck and she feels that the cord is more prominent. However she has lost significant weight during her treatment for cancer. The home health nurse encouraged her to come here to make sure there was not an infection. COPD - patient has advanced COPD dependent on oxygen. She uses on a concentrator when she is at home and active however when she is sedentary she removes her nasal cannula. She has a portable unit for outside the home but she states it's too heavy and she cannot carry on her shoulder. She has mild chronic cough productive of thick white phlegm at times. No recent evidence of bacterial infection with fever chills increased wheezing or  shortness of breath.   Review of Systems  Constitutional: Negative for chills, fatigue and fever.  HENT: Negative for trouble swallowing.   Eyes: Positive for visual disturbance.  Respiratory: Positive for cough and shortness of breath. Negative for chest tightness and wheezing.   Cardiovascular: Negative for chest pain, palpitations and leg swelling.  Gastrointestinal: Positive for abdominal pain and diarrhea (occasional loose stools relieved by immodium). Negative for blood in stool, constipation, rectal pain and vomiting.  Genitourinary: Negative for dysuria and frequency.  Skin: Positive for rash.  Psychiatric/Behavioral: Negative for sleep disturbance.    Patient Active Problem List   Diagnosis Date Noted  . HPV (human papilloma virus) anogenital infection 07/07/2016  . Edema extremities 05/05/2016  . Weakness 02/08/2016  . 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 Blood in stool   . Benign neoplasm of transverse colon   . Benign neoplasm of rectosigmoid junction   . Fistula of intestine, excluding rectum and anus   . Pulmonary emphysema ([place]) 10/24/2015  . Encounter for long-term (current) use of medications 02/28/2015  . Aortic atherosclerosis ([place]) 12/30/2014  . CAD in native artery 12/30/2014  . DDD (degenerative disc disease), lumbar 12/30/2014  . Dyslipidemia 12/30/2014  . Essential (primary) hypertension 12/30/2014  . Dermatophytoses 12/30/2014  . Compulsive tobacco user syndrome 12/30/2014    Prior to Admission medications   Medication Sig Start Date End Date Taking? Authorizing Provider  acetaminophen (TYLENOL) 500 MG tablet Take 1,000 mg by mouth every 6 (six) hours as needed.   Yes Historical  Provider, MD  albuterol (PROVENTIL HFA;VENTOLIN HFA) 108 (90 Base) MCG/ACT inhaler Inhale 2 puffs into the lungs every 6 (six) hours as needed for wheezing or shortness of breath. 09/03/16  Yes Glean Hess, MD  ALPRAZolam Duanne Moron) 0.25 MG tablet Take 1 tablet  (0.25 mg total) by mouth 2 (two) times daily as needed for anxiety. 08/02/16  Yes Lloyd Huger, MD  amLODipine (NORVASC) 5 MG tablet TAKE 1 TABLET EVERY DAY 08/03/16  Yes Glean Hess, MD  ANORO ELLIPTA 62.5-25 MCG/INH AEPB INHALE 1 PUFF INTO THE LUNGS DAILY. 10/15/16  Yes Glean Hess, MD  atorvastatin (LIPITOR) 20 MG tablet TAKE 1 TABLET AT BEDTIME 08/03/16  Yes Glean Hess, MD  hydrOXYzine (ATARAX/VISTARIL) 50 MG tablet Take 50 mg by mouth daily. 09/23/16  Yes Historical Provider, MD  OXYGEN Inhale 2.5 L/min into the lungs continuous.    Yes Historical Provider, MD    Allergies  Allergen Reactions  . Aspirin Nausea And Vomiting  . Codeine Nausea And Vomiting  . Morphine And Related Nausea And Vomiting    Past Surgical History:  Procedure Laterality Date  . APPENDECTOMY    . BREAST BIOPSY Left 1970's  . COLONOSCOPY WITH PROPOFOL N/A 11/06/2015   Procedure: COLONOSCOPY WITH PROPOFOL;  Surgeon: Lucilla Lame, MD;  Location: [place];  Service: Endoscopy;  Laterality: N/A;  LEAVE PT EARLY  . EXPLORATORY LAPAROTOMY    . LESION EXCISION  11/03/15   Anal - Dr Dahlia Byes, [facility]  . LESION REMOVAL  1970's   from tailbone  . [place] SURGERY  1997  . OVARIAN CYST REMOVAL Left   . POLYPECTOMY  11/06/2015   Procedure: POLYPECTOMY;  Surgeon: Lucilla Lame, MD;  Location: [place];  Service: Endoscopy;;  . PORTACATH PLACEMENT N/A 12/10/2015   Procedure: INSERTION PORT-A-CATH;  Surgeon: Jules Husbands, MD;  Location: [facility];  Service: General;  Laterality: N/A;  . RECTAL EXAM UNDER ANESTHESIA N/A 06/23/2016   Procedure: RECTAL EXAM UNDER ANESTHESIA;  Surgeon: Jules Husbands, MD;  Location: [facility];  Service: General;  Laterality: N/A;  . VEIN LIGATION AND STRIPPING      Social History  Substance Use Topics  . Smoking status: Former Smoker    Packs/day: 1.00    Years: 57.00    Types: Cigarettes    Quit date: 03/30/2016  . Smokeless tobacco: Never Used      Comment: Quit in July 2017  . Alcohol use No     Medication list has been reviewed and updated.   Physical Exam  Constitutional: She is oriented to person, place, and time. She appears well-developed. No distress.  HENT:  Head: Normocephalic and atraumatic.  Neck:    Cardiovascular: Normal rate, regular rhythm and normal heart sounds.   Pulmonary/Chest: Effort normal. No respiratory distress. She has decreased breath sounds. She has no wheezes. She has no rales.  Abdominal: Soft. Normal appearance and bowel sounds are normal. There is tenderness in the left lower quadrant. There is no rigidity, no rebound, no guarding and no CVA tenderness.  Musculoskeletal: Normal range of motion.  Neurological: She is alert and oriented to person, place, and time.  Skin: Skin is warm and dry. No rash noted.  Scattered red scaly patches around both sides of neck and upper back  Psychiatric: She has a normal mood and affect. Her behavior is normal. Thought content normal.  Nursing note and vitals reviewed.   BP (!) 142/60 (BP Location: Right  Arm, Patient Position: Sitting, Cuff Size: Normal)   Pulse 92   Temp 97.8 F (36.6 C)   Ht '5\' 2"'$  (1.575 m)   Wt 142 lb 3.2 oz (64.5 kg)   SpO2 (!) 89%   BMI 26.01 kg/m   Assessment and Plan: 1. Pulmonary emphysema, unspecified emphysema type ([place]) Encouraged use of O2 around the clock but patient states that portable unit is too heavy - albuterol (PROVENTIL HFA;VENTOLIN HFA) 108 (90 Base) MCG/ACT inhaler; Inhale 2 puffs into the lungs every 6 (six) hours as needed for wheezing or shortness of breath.  Dispense: 3 Inhaler; Refill: 4  2. Central venous catheter in place Suggest referral to Gen Surgery for removal  3. Squamous cell carcinoma of anus (HCC) s/p chemo and XRT and doing well  4. Abdominal pain, LLQ Recommend Probiotics daily Return if sx worsen  5. Essential (primary) hypertension controlled   Meds ordered this encounter    Medications  . albuterol (PROVENTIL HFA;VENTOLIN HFA) 108 (90 Base) MCG/ACT inhaler    Sig: Inhale 2 puffs into the lungs every 6 (six) hours as needed for wheezing or shortness of breath.    Dispense:  3 Inhaler    Refill:  4  . amLODipine (NORVASC) 5 MG tablet    Sig: Take 1 tablet (5 mg total) by mouth daily.    Dispense:  90 tablet    Refill:  [place], MD [facility]  12/29/2016

## 2017-01-11 DIAGNOSIS — J441 Chronic obstructive pulmonary disease with (acute) exacerbation: Secondary | ICD-10-CM | POA: Diagnosis not present

## 2017-02-10 DIAGNOSIS — J441 Chronic obstructive pulmonary disease with (acute) exacerbation: Secondary | ICD-10-CM | POA: Diagnosis not present

## 2017-02-22 DIAGNOSIS — H52223 Regular astigmatism, bilateral: Secondary | ICD-10-CM | POA: Diagnosis not present

## 2017-02-22 DIAGNOSIS — H5203 Hypermetropia, bilateral: Secondary | ICD-10-CM | POA: Diagnosis not present

## 2017-02-22 DIAGNOSIS — H25813 Combined forms of age-related cataract, bilateral: Secondary | ICD-10-CM | POA: Diagnosis not present

## 2017-02-22 DIAGNOSIS — I10 Essential (primary) hypertension: Secondary | ICD-10-CM | POA: Diagnosis not present

## 2017-02-24 ENCOUNTER — Ambulatory Visit
Admission: RE | Admit: 2017-02-24 | Discharge: 2017-02-24 | Disposition: A | Discharge status: Home / Self Care | Payer: Medicare HMO | Source: Ambulatory Visit | Attending: Radiation Oncology | Admitting: Radiation Oncology

## 2017-02-24 ENCOUNTER — Encounter: Payer: Self-pay | Admitting: Radiation Oncology

## 2017-02-24 VITALS — BP 150/79 | HR 78 | Temp 96.4°F | Resp 22 | Wt 145.9 lb

## 2017-02-24 DIAGNOSIS — C210 Malignant neoplasm of anus, unspecified: Secondary | ICD-10-CM

## 2017-02-24 DIAGNOSIS — Z923 Personal history of irradiation: Secondary | ICD-10-CM | POA: Insufficient documentation

## 2017-02-24 DIAGNOSIS — Z85048 Personal history of other malignant neoplasm of rectum, rectosigmoid junction, and anus: Secondary | ICD-10-CM | POA: Insufficient documentation

## 2017-02-24 NOTE — Progress Notes (Signed)
Radiation Oncology Follow up Note  Name: Carol Gay   Date:   02/24/2017 MRN:  [id number] DOB: August 20, 1946    This 71 y.o. female presents to the clinic today for 1 year follow-up status post concurrent chemoradiation for stage IIIa squamous cell carcinoma the anus.  REFERRING PROVIDER: Glean Hess, MD  HPI: patient is a 71 year old female now out 1 year having completed combined modality treatment with concurrent chemoradiation for squamous cell carcinoma the anus stage IIIa (T4 NX M0). Seen today in routine follow-up she is doing well. She she states her bowel movements are fine except when she is in the sun and then has diarrhea?. She specifically denies any abdominal pain or discomfort any increased lower urinary tract symptoms or rectal pain..her last CT scan was back in October 2017 showing no evidence of anal lesion or metastatic disease in the abdomen or pelvis.  COMPLICATIONS OF TREATMENT: none  FOLLOW UP COMPLIANCE: keeps appointments   PHYSICAL EXAM:  BP (!) 150/79   Pulse 78   Temp (!) 96.4 F (35.8 C)   Resp (!) 22   Wt 145 lb 15.1 oz (66.2 kg)   BMI 26.69 kg/m  On rectal exam no evidence of mass or nodularity anal region is noted rectal exam is within normal limits. No inguinal adenopathy is identified. Well-developed well-nourished patient in NAD. HEENT reveals PERLA, EOMI, discs not visualized.  Oral cavity is clear. No oral mucosal lesions are identified. Neck is clear without evidence of cervical or supraclavicular adenopathy. Lungs are clear to A&P. Cardiac examination is essentially unremarkable with regular rate and rhythm without murmur rub or thrill. Abdomen is benign with no organomegaly or masses noted. Motor sensory and DTR levels are equal and symmetric in the upper and lower extremities. Cranial nerves II through XII are grossly intact. Proprioception is intact. No peripheral adenopathy or edema is identified. No motor or sensory levels are noted. Crude  visual fields are within normal range.  RADIOLOGY RESULTS: previous CT scan is reviewed and compatible above-stated findings  PLAN: at the present time she is doing well with no evidence of disease. I've asked to see her back in 1 year for follow-up. She continues follow-up care with medical oncology. Patient knows to call with any concerns.  I would like to take this opportunity to thank you for allowing me to participate in the care of your patient.Armstead Peaks., MD

## 2017-03-01 DIAGNOSIS — H18413 Arcus senilis, bilateral: Secondary | ICD-10-CM | POA: Diagnosis not present

## 2017-03-01 DIAGNOSIS — H25013 Cortical age-related cataract, bilateral: Secondary | ICD-10-CM | POA: Diagnosis not present

## 2017-03-01 DIAGNOSIS — H25043 Posterior subcapsular polar age-related cataract, bilateral: Secondary | ICD-10-CM | POA: Diagnosis not present

## 2017-03-01 DIAGNOSIS — H2513 Age-related nuclear cataract, bilateral: Secondary | ICD-10-CM | POA: Diagnosis not present

## 2017-03-01 DIAGNOSIS — H2512 Age-related nuclear cataract, left eye: Secondary | ICD-10-CM | POA: Diagnosis not present

## 2017-03-04 DIAGNOSIS — Z961 Presence of intraocular lens: Secondary | ICD-10-CM | POA: Diagnosis not present

## 2017-03-04 DIAGNOSIS — H2512 Age-related nuclear cataract, left eye: Secondary | ICD-10-CM | POA: Diagnosis not present

## 2017-03-04 DIAGNOSIS — H25811 Combined forms of age-related cataract, right eye: Secondary | ICD-10-CM | POA: Diagnosis not present

## 2017-03-04 DIAGNOSIS — H5201 Hypermetropia, right eye: Secondary | ICD-10-CM | POA: Diagnosis not present

## 2017-03-04 DIAGNOSIS — H2511 Age-related nuclear cataract, right eye: Secondary | ICD-10-CM | POA: Diagnosis not present

## 2017-03-04 DIAGNOSIS — I10 Essential (primary) hypertension: Secondary | ICD-10-CM | POA: Diagnosis not present

## 2017-03-04 DIAGNOSIS — H52223 Regular astigmatism, bilateral: Secondary | ICD-10-CM | POA: Diagnosis not present

## 2017-03-04 DIAGNOSIS — H25812 Combined forms of age-related cataract, left eye: Secondary | ICD-10-CM | POA: Diagnosis not present

## 2017-03-13 DIAGNOSIS — J441 Chronic obstructive pulmonary disease with (acute) exacerbation: Secondary | ICD-10-CM | POA: Diagnosis not present

## 2017-03-18 DIAGNOSIS — I10 Essential (primary) hypertension: Secondary | ICD-10-CM | POA: Diagnosis not present

## 2017-03-18 DIAGNOSIS — H2511 Age-related nuclear cataract, right eye: Secondary | ICD-10-CM | POA: Diagnosis not present

## 2017-03-18 DIAGNOSIS — H52223 Regular astigmatism, bilateral: Secondary | ICD-10-CM | POA: Diagnosis not present

## 2017-03-18 DIAGNOSIS — Z961 Presence of intraocular lens: Secondary | ICD-10-CM | POA: Diagnosis not present

## 2017-03-18 DIAGNOSIS — H5211 Myopia, right eye: Secondary | ICD-10-CM | POA: Diagnosis not present

## 2017-03-18 DIAGNOSIS — H25811 Combined forms of age-related cataract, right eye: Secondary | ICD-10-CM | POA: Diagnosis not present

## 2017-03-21 ENCOUNTER — Ambulatory Visit: Payer: Self-pay

## 2017-04-04 ENCOUNTER — Other Ambulatory Visit: Payer: Self-pay

## 2017-04-04 ENCOUNTER — Ambulatory Visit: Payer: Self-pay | Admitting: Oncology

## 2017-04-05 NOTE — Progress Notes (Signed)
[place]  [phone]  [phone]     Carol Gay DOB: 07/20/1946  MR#: [id number]  BDZ#:[id number]  Patient Care Team: Glean Hess, MD as PCP - General (Family Medicine) Clent Jacks, RN as Registered Nurse Lucilla Lame, MD as Consulting Physician (Gastroenterology)  CHIEF COMPLAINT:  Squamous cell carcinoma of the anus, stage II, T2 N0 M0.  INTERVAL HISTORY:   Patient returns to clinic today for routine 6 month evaluation. She currently feels well and is asymptomatic. She has no neurologic complaints. She denies any recent fevers or illnesses. Her shortness of breath and cough have significantly improved and she only uses oxygen intermittently. She denies any chest pain or hemoptysis. She has a good appetite and denies weight loss. She denies any nausea, vomiting, constipation, or diarrhea. She has no changes in her bowel movements. She denies any pain. She has no urinary complaints. Patient feels at her baseline and offers no specific complaints today.   REVIEW OF SYSTEMS:   Review of Systems  Constitutional: Negative for fever, malaise/fatigue and weight loss.  HENT: Negative.   Eyes: Negative.   Respiratory: Negative.  Negative for cough and shortness of breath.   Cardiovascular: Negative for chest pain and leg swelling.  Gastrointestinal: Negative for abdominal pain, blood in stool, constipation, diarrhea, melena, nausea and vomiting.  Genitourinary: Negative.   Musculoskeletal: Negative.   Skin: Negative.  Negative for rash.  Neurological: Negative for dizziness, weakness and headaches.  Endo/Heme/Allergies: Does not bruise/bleed easily.  Psychiatric/Behavioral: Negative.  The patient is not nervous/anxious.     As per HPI. Otherwise, a complete review of systems is negative.   PAST MEDICAL HISTORY: Past Medical History:  Diagnosis Date  . Aortic atherosclerosis ([place])   . Arm pain   . Asthma   . CAD in native artery    . Cancer ([place]) 09/2015   rectum  . Cigarette smoker   . Claustrophobia   . 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 COPD (chronic obstructive pulmonary disease) ([place])   . DDD (degenerative disc disease), lumbar       . Dermatophytoses   . Dyslipidemia   . H/O blood clots    left leg, veins stripped  . Hemorrhoids   . Hypertension   . Last menstrual period (LMP) > 10 days ago 1994  . Myocardial infarction [facility])    "mild" - age 89  . Pulmonary emphysema ([place])   . Rectal bleeding   . Shortness of breath dyspnea   . Smokers' cough ([place])   . Spinal headache    with one of my children's birth  . Vertigo   . Wears dentures    has full upper and lower - doesn't wear    PAST SURGICAL HISTORY: Past Surgical History:  Procedure Laterality Date  . APPENDECTOMY    . BREAST BIOPSY Left 1970's  . COLONOSCOPY WITH PROPOFOL N/A 11/06/2015   Procedure: COLONOSCOPY WITH PROPOFOL;  Surgeon: Lucilla Lame, MD;  Location: [place];  Service: Endoscopy;  Laterality: N/A;  LEAVE PT EARLY  . EXPLORATORY LAPAROTOMY    . LESION EXCISION  11/03/15   Anal - Dr Dahlia Byes, [facility]  . LESION REMOVAL  1970's   from tailbone  . [place] SURGERY  1997  . OVARIAN CYST REMOVAL Left   . POLYPECTOMY  11/06/2015   Procedure: POLYPECTOMY;  Surgeon: Lucilla Lame, MD;  Location: [place];  Service:  Endoscopy;;  . PORTACATH PLACEMENT N/A 12/10/2015   Procedure: INSERTION PORT-A-CATH;  Surgeon: Jules Husbands, MD;  Location: [facility];  Service: General;  Laterality: N/A;  . RECTAL EXAM UNDER ANESTHESIA N/A 06/23/2016   Procedure: RECTAL EXAM UNDER ANESTHESIA;  Surgeon: Jules Husbands, MD;  Location: [facility];  Service: General;  Laterality: N/A;  . VEIN LIGATION AND STRIPPING      FAMILY HISTORY Family History  Problem Relation Age of Onset  . Cancer Mother        lymphoma  . Heart disease Mother   . Cancer Maternal Grandmother        breast  . Birth defects  Maternal Grandmother     GYNECOLOGIC HISTORY:  No LMP recorded. Patient is postmenopausal.     ADVANCED DIRECTIVES:    HEALTH MAINTENANCE: Social History  Substance Use Topics  . Smoking status: Former Smoker    Packs/day: 1.00    Years: 57.00    Types: Cigarettes    Quit date: 03/30/2016  . Smokeless tobacco: Never Used     Comment: Quit in July 2017  . Alcohol use No     Allergies  Allergen Reactions  . Aspirin Nausea And Vomiting  . Codeine Nausea And Vomiting  . Morphine And Related Nausea And Vomiting    Current Outpatient Prescriptions  Medication Sig Dispense Refill  . acetaminophen (TYLENOL) 500 MG tablet Take 1,000 mg by mouth every 6 (six) hours as needed.    Marland Kitchen albuterol (PROVENTIL HFA;VENTOLIN HFA) 108 (90 Base) MCG/ACT inhaler Inhale 2 puffs into the lungs every 6 (six) hours as needed for wheezing or shortness of breath. 3 Inhaler 4  . ALPRAZolam (XANAX) 0.25 MG tablet Take 1 tablet (0.25 mg total) by mouth 2 (two) times daily as needed for anxiety. 60 tablet 0  . amLODipine (NORVASC) 5 MG tablet Take 1 tablet (5 mg total) by mouth daily. 90 tablet 3  . ANORO ELLIPTA 62.5-25 MCG/INH AEPB INHALE 1 PUFF INTO THE LUNGS DAILY. 180 each 1  . atorvastatin (LIPITOR) 20 MG tablet TAKE 1 TABLET AT BEDTIME 90 tablet 1  . OXYGEN Inhale 2.5 L/min into the lungs continuous.     . Probiotic Product (PROBIOTIC ACIDOPHILUS BEADS) CAPS Take by mouth.    . hydrOXYzine (ATARAX/VISTARIL) 50 MG tablet Take 50 mg by mouth daily.     No current facility-administered medications for this visit.    Facility-Administered Medications Ordered in Other Visits  Medication Dose Route Frequency Provider Last Rate Last Dose  . sodium chloride flush (NS) 0.9 % injection 10 mL  10 mL Intravenous PRN Lloyd Huger, MD   10 mL at 04/06/17 1101    OBJECTIVE: BP (!) 149/86 (BP Location: Right Arm, Patient Position: Sitting)   Pulse 78   Temp 97.8 F (36.6 C) (Tympanic)   Resp 18    Wt 153 lb 14.4 oz (69.8 kg)   BMI 28.15 kg/m    Body mass index is 28.15 kg/m.    ECOG FS:0 - Asymptomatic  General: Well-developed, well-nourished, no acute distress. Eyes: Pink conjunctiva, anicteric sclera. Lungs: Clear to auscultation bilaterally. Heart: Regular rate and rhythm. No rubs, murmurs, or gallops. Abdomen: Soft, nontender, nondistended. No organomegaly noted, normoactive bowel sounds. Musculoskeletal: mild nonpitting edema bilateral feet.                 Neuro: Alert, answering all questions appropriately. Cranial nerves grossly intact. Skin: No rashes or petechiae noted. Psych: Normal affect.  LAB RESULTS:  Infusion on 04/06/2017  Component Date Value Ref Range Status  . WBC 04/06/2017 6.9  3.6 - 11.0 K/uL Final  . RBC 04/06/2017 4.66  3.80 - 5.20 MIL/uL Final  . Hemoglobin 04/06/2017 14.2  12.0 - 16.0 g/dL Final  . HCT 04/06/2017 41.7  35.0 - 47.0 % Final  . MCV 04/06/2017 89.4  80.0 - 100.0 fL Final  . MCH 04/06/2017 30.5  26.0 - 34.0 pg Final  . MCHC 04/06/2017 34.2  32.0 - 36.0 g/dL Final  . RDW 04/06/2017 14.5  11.5 - 14.5 % Final  . Platelets 04/06/2017 207  150 - 440 K/uL Final  . Neutrophils Relative % 04/06/2017 60  % Final  . Neutro Abs 04/06/2017 4.1  1.4 - 6.5 K/uL Final  . Lymphocytes Relative 04/06/2017 27  % Final  . Lymphs Abs 04/06/2017 1.9  1.0 - 3.6 K/uL Final  . Monocytes Relative 04/06/2017 7  % Final  . Monocytes Absolute 04/06/2017 0.5  0.2 - 0.9 K/uL Final  . Eosinophils Relative 04/06/2017 5  % Final  . Eosinophils Absolute 04/06/2017 0.3  0 - 0.7 K/uL Final  . Basophils Relative 04/06/2017 1  % Final  . Basophils Absolute 04/06/2017 0.1  0 - 0.1 K/uL Final  . Sodium 04/06/2017 135  135 - 145 mmol/L Final  . Potassium 04/06/2017 4.6  3.5 - 5.1 mmol/L Final  . Chloride 04/06/2017 104  101 - 111 mmol/L Final  . CO2 04/06/2017 24  22 - 32 mmol/L Final  . Glucose, Bld 04/06/2017 95  65 - 99 mg/dL Final  . BUN 04/06/2017 28* 6 - 20  mg/dL Final  . Creatinine, Ser 04/06/2017 0.95  0.44 - 1.00 mg/dL Final  . Calcium 04/06/2017 10.3  8.9 - 10.3 mg/dL Final  . Total Protein 04/06/2017 8.2* 6.5 - 8.1 g/dL Final  . Albumin 04/06/2017 4.1  3.5 - 5.0 g/dL Final  . AST 04/06/2017 22  15 - 41 U/L Final  . ALT 04/06/2017 17  14 - 54 U/L Final  . Alkaline Phosphatase 04/06/2017 87  38 - 126 U/L Final  . Total Bilirubin 04/06/2017 0.6  0.3 - 1.2 mg/dL Final  . GFR calc non Af Amer 04/06/2017 59* >60 mL/min Final  . GFR calc Af Amer 04/06/2017 >60  >60 mL/min Final   Comment: (NOTE) The eGFR has been calculated using the CKD EPI equation. This calculation has not been validated in all clinical situations. eGFR's persistently <60 mL/min signify possible Chronic Kidney Disease.   . Anion gap 04/06/2017 7  5 - 15 Final    STUDIES: No results found.  ASSESSMENT:  Squamous cell carcinoma of the anus, stage II, T2 N0 M0.  PLAN:    1. Squamous cell carcinoma of the anus:Diagnosed in February 2017.  CT scan results from July 02, 2016 reviewed independently with no evidence of disease. Per NCCN guidelines, patient will require an anoscope every 6-12 months for 3 years. Will also do abdominal and pelvic CT with contrast annually for 3 years, her next one is due in October 2018. Return to clinic in 6 months with repeat laboratory work and further evaluation.Continued follow-up with GI as scheduled. 2. Bilateral peripheral edema: Resolved. 3. Shortness of breath: Continue oxygen as needed.  Patient expressed understanding and was in agreement with this plan. She also understands that She can call clinic at any time with any questions, concerns, or complaints.    Squamous cell carcinoma of anus (HCC)  Staging form: Anus, AJCC 7th Edition     Clinical stage from 11/28/2015: Stage II (T2, N0, M0) - Signed by Lloyd Huger, MD on 11/28/2015   Lucendia Herrlich, NP  04/06/17 1:27 PM   Patient was seen and evaluated  independently and I agree with the assessment and plan as dictated above. Patient reports anoscope under anesthesia was not recommended by surgery given her oxygen requirements. Continue follow-up as above.  Lloyd Huger, MD 04/06/17 1:27 PM

## 2017-04-06 ENCOUNTER — Inpatient Hospital Stay: Payer: Medicare HMO | Attending: Oncology | Admitting: Oncology

## 2017-04-06 ENCOUNTER — Inpatient Hospital Stay: Payer: Medicare HMO

## 2017-04-06 VITALS — BP 149/86 | HR 78 | Temp 97.8°F | Resp 18 | Wt 153.9 lb

## 2017-04-06 DIAGNOSIS — J439 Emphysema, unspecified: Secondary | ICD-10-CM | POA: Diagnosis not present

## 2017-04-06 DIAGNOSIS — Z807 Family history of other malignant neoplasms of lymphoid, hematopoietic and related tissues: Secondary | ICD-10-CM | POA: Diagnosis not present

## 2017-04-06 DIAGNOSIS — I700 Atherosclerosis of aorta: Secondary | ICD-10-CM | POA: Insufficient documentation

## 2017-04-06 DIAGNOSIS — R0602 Shortness of breath: Secondary | ICD-10-CM | POA: Insufficient documentation

## 2017-04-06 DIAGNOSIS — Z87891 Personal history of nicotine dependence: Secondary | ICD-10-CM | POA: Insufficient documentation

## 2017-04-06 DIAGNOSIS — C210 Malignant neoplasm of anus, unspecified: Secondary | ICD-10-CM

## 2017-04-06 DIAGNOSIS — Z85048 Personal history of other malignant neoplasm of rectum, rectosigmoid junction, and anus: Secondary | ICD-10-CM

## 2017-04-06 DIAGNOSIS — Z803 Family history of malignant neoplasm of breast: Secondary | ICD-10-CM

## 2017-04-06 DIAGNOSIS — R609 Edema, unspecified: Secondary | ICD-10-CM | POA: Diagnosis not present

## 2017-04-06 DIAGNOSIS — I2510 Atherosclerotic heart disease of native coronary artery without angina pectoris: Secondary | ICD-10-CM | POA: Insufficient documentation

## 2017-04-06 DIAGNOSIS — J449 Chronic obstructive pulmonary disease, unspecified: Secondary | ICD-10-CM | POA: Insufficient documentation

## 2017-04-06 DIAGNOSIS — E785 Hyperlipidemia, unspecified: Secondary | ICD-10-CM | POA: Diagnosis not present

## 2017-04-06 DIAGNOSIS — Q600 Renal agenesis, unilateral: Secondary | ICD-10-CM | POA: Insufficient documentation

## 2017-04-06 DIAGNOSIS — Z79899 Other long term (current) drug therapy: Secondary | ICD-10-CM | POA: Insufficient documentation

## 2017-04-06 DIAGNOSIS — Z95828 Presence of other vascular implants and grafts: Secondary | ICD-10-CM

## 2017-04-06 DIAGNOSIS — I252 Old myocardial infarction: Secondary | ICD-10-CM

## 2017-04-06 DIAGNOSIS — I10 Essential (primary) hypertension: Secondary | ICD-10-CM

## 2017-04-06 DIAGNOSIS — J45909 Unspecified asthma, uncomplicated: Secondary | ICD-10-CM | POA: Diagnosis not present

## 2017-04-06 DIAGNOSIS — M5136 Other intervertebral disc degeneration, lumbar region: Secondary | ICD-10-CM | POA: Insufficient documentation

## 2017-04-06 LAB — CBC WITH DIFFERENTIAL/PLATELET
BASOS ABS: 0.1 10*3/uL (ref 0–0.1)
Basophils Relative: 1 %
Eosinophils Absolute: 0.3 10*3/uL (ref 0–0.7)
Eosinophils Relative: 5 %
HEMATOCRIT: 41.7 % (ref 35.0–47.0)
Hemoglobin: 14.2 g/dL (ref 12.0–16.0)
LYMPHS PCT: 27 %
Lymphs Abs: 1.9 10*3/uL (ref 1.0–3.6)
MCH: 30.5 pg (ref 26.0–34.0)
MCHC: 34.2 g/dL (ref 32.0–36.0)
MCV: 89.4 fL (ref 80.0–100.0)
MONO ABS: 0.5 10*3/uL (ref 0.2–0.9)
Monocytes Relative: 7 %
NEUTROS ABS: 4.1 10*3/uL (ref 1.4–6.5)
Neutrophils Relative %: 60 %
Platelets: 207 10*3/uL (ref 150–440)
RBC: 4.66 MIL/uL (ref 3.80–5.20)
RDW: 14.5 % (ref 11.5–14.5)
WBC: 6.9 10*3/uL (ref 3.6–11.0)

## 2017-04-06 LAB — COMPREHENSIVE METABOLIC PANEL
ALT: 17 U/L (ref 14–54)
AST: 22 U/L (ref 15–41)
Albumin: 4.1 g/dL (ref 3.5–5.0)
Alkaline Phosphatase: 87 U/L (ref 38–126)
Anion gap: 7 (ref 5–15)
BILIRUBIN TOTAL: 0.6 mg/dL (ref 0.3–1.2)
BUN: 28 mg/dL — AB (ref 6–20)
CO2: 24 mmol/L (ref 22–32)
CREATININE: 0.95 mg/dL (ref 0.44–1.00)
Calcium: 10.3 mg/dL (ref 8.9–10.3)
Chloride: 104 mmol/L (ref 101–111)
GFR calc Af Amer: >60 mL/min (ref >60)
GFR, EST NON AFRICAN AMERICAN: 59 mL/min — AB (ref >60)
Glucose, Bld: 95 mg/dL (ref 65–99)
Potassium: 4.6 mmol/L (ref 3.5–5.1)
Sodium: 135 mmol/L (ref 135–145)
Total Protein: 8.2 g/dL — ABNORMAL HIGH (ref 6.5–8.1)

## 2017-04-06 MED ORDER — SODIUM CHLORIDE 0.9% FLUSH
10.0000 mL | INTRAVENOUS | Status: DC | PRN
  Administered 2017-04-06: 10 mL via INTRAVENOUS
  Filled 2017-04-06: qty 10

## 2017-04-06 MED ORDER — HEPARIN SOD (PORK) LOCK FLUSH 100 UNIT/ML IV SOLN
500.0000 [IU] | Freq: Once | INTRAVENOUS | Status: AC
  Administered 2017-04-06: 500 [IU] via INTRAVENOUS

## 2017-04-06 NOTE — Progress Notes (Signed)
Patient is here for follow up  

## 2017-04-12 DIAGNOSIS — J441 Chronic obstructive pulmonary disease with (acute) exacerbation: Secondary | ICD-10-CM | POA: Diagnosis not present

## 2017-05-13 DIAGNOSIS — J441 Chronic obstructive pulmonary disease with (acute) exacerbation: Secondary | ICD-10-CM | POA: Diagnosis not present

## 2017-05-16 ENCOUNTER — Ambulatory Visit (INDEPENDENT_AMBULATORY_CARE_PROVIDER_SITE_OTHER): Payer: Medicare HMO

## 2017-05-16 VITALS — BP 160/80 | HR 82 | Temp 97.8°F | Resp 18 | Ht 62.0 in | Wt 161.6 lb

## 2017-05-16 DIAGNOSIS — Z1239 Encounter for other screening for malignant neoplasm of breast: Secondary | ICD-10-CM

## 2017-05-16 DIAGNOSIS — Z0000 Encounter for general adult medical examination without abnormal findings: Secondary | ICD-10-CM

## 2017-05-16 DIAGNOSIS — Z1231 Encounter for screening mammogram for malignant neoplasm of breast: Secondary | ICD-10-CM | POA: Diagnosis not present

## 2017-05-16 DIAGNOSIS — Z13820 Encounter for screening for osteoporosis: Secondary | ICD-10-CM

## 2017-05-16 NOTE — Progress Notes (Signed)
Subjective:   Carol Gay is a 71 y.o. female who presents for Medicare Annual (Subsequent) preventive examination.  Review of Systems:  Cardiac Risk Factors include: advanced age (>59men, >74 women);hypertension;dyslipidemia;smoking/ tobacco exposure     Objective:     Vitals: BP (!) 160/80 (BP Location: Right Arm, Patient Position: Sitting)   Pulse 82   Temp 97.8 F (36.6 C)   Resp 18   Ht 5\' 2"  (1.575 m)   Wt 161 lb 9.6 oz (73.3 kg)   BMI 29.56 kg/m   Body mass index is 29.56 kg/m.   Tobacco History  Smoking Status  . Former Smoker  . Packs/day: 1.00  . Years: 57.00  . Types: Cigarettes  . Quit date: 03/30/2016  Smokeless Tobacco  . Never Used    Comment: Quit in July 2017     Counseling given: Not Answered   Past Medical History:  Diagnosis Date  . Aortic atherosclerosis ([place])   . Arm pain   . Asthma   . CAD in native artery   . Cancer ([place]) 09/2015   rectum  . Cigarette smoker   . Claustrophobia   . 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 COPD (chronic obstructive pulmonary disease) ([place])   . DDD (degenerative disc disease), lumbar       . Dermatophytoses   . Dyslipidemia   . H/O blood clots    left leg, veins stripped  . Hemorrhoids   . Hypertension   . Last menstrual period (LMP) > 10 days ago 1994  . Myocardial infarction [facility])    "mild" - age 62  . Pulmonary emphysema ([place])   . Rectal bleeding   . Shortness of breath dyspnea   . Smokers' cough ([place])   . Spinal headache    with one of my children's birth  . Vertigo   . Wears dentures    has full upper and lower - doesn't wear   Past Surgical History:  Procedure Laterality Date  . APPENDECTOMY    . BREAST BIOPSY Left 1970's  . COLONOSCOPY WITH PROPOFOL N/A 11/06/2015   Procedure: COLONOSCOPY WITH PROPOFOL;  Surgeon: Lucilla Lame, MD;  Location: [place];  Service: Endoscopy;  Laterality: N/A;  LEAVE PT EARLY  . EXPLORATORY LAPAROTOMY    .  LESION EXCISION  11/03/15   Anal - Dr Dahlia Byes, [facility]  . LESION REMOVAL  1970's   from tailbone  . [place] SURGERY  1997  . OVARIAN CYST REMOVAL Left   . POLYPECTOMY  11/06/2015   Procedure: POLYPECTOMY;  Surgeon: Lucilla Lame, MD;  Location: [place];  Service: Endoscopy;;  . PORTACATH PLACEMENT N/A 12/10/2015   Procedure: INSERTION PORT-A-CATH;  Surgeon: Jules Husbands, MD;  Location: [facility];  Service: General;  Laterality: N/A;  . RECTAL EXAM UNDER ANESTHESIA N/A 06/23/2016   Procedure: RECTAL EXAM UNDER ANESTHESIA;  Surgeon: Jules Husbands, MD;  Location: [facility];  Service: General;  Laterality: N/A;  . VEIN LIGATION AND STRIPPING     Family History  Problem Relation Age of Onset  . Cancer Mother        lymphoma  . Heart disease Mother   . Cancer Maternal Grandmother        breast  . Birth defects Maternal Grandmother    History  Sexual Activity  . Sexual activity: Not on file    Outpatient Encounter Prescriptions as of 05/16/2017  Medication Sig  .  acetaminophen (TYLENOL) 500 MG tablet Take 1,000 mg by mouth every 6 (six) hours as needed.  Marland Kitchen albuterol (PROVENTIL HFA;VENTOLIN HFA) 108 (90 Base) MCG/ACT inhaler Inhale 2 puffs into the lungs every 6 (six) hours as needed for wheezing or shortness of breath.  . ALPRAZolam (XANAX) 0.25 MG tablet Take 1 tablet (0.25 mg total) by mouth 2 (two) times daily as needed for anxiety.  Marland Kitchen amLODipine (NORVASC) 5 MG tablet Take 1 tablet (5 mg total) by mouth daily.  Jearl Klinefelter ELLIPTA 62.5-25 MCG/INH AEPB INHALE 1 PUFF INTO THE LUNGS DAILY.  Marland Kitchen atorvastatin (LIPITOR) 20 MG tablet TAKE 1 TABLET AT BEDTIME  . OXYGEN Inhale 2.5 L/min into the lungs continuous.   . [DISCONTINUED] hydrOXYzine (ATARAX/VISTARIL) 50 MG tablet Take 50 mg by mouth daily.  . [DISCONTINUED] Probiotic Product (PROBIOTIC ACIDOPHILUS BEADS) CAPS Take by mouth.   No facility-administered encounter medications on file as of 05/16/2017.     Activities of Daily  Living In your present state of health, do you have any difficulty performing the following activities: 05/16/2017 06/14/2016  Hearing? N N  Vision? N N  Difficulty concentrating or making decisions? N N  Walking or climbing stairs? Y Y  Comment SOB  -  Dressing or bathing? N Y  Doing errands, shopping? N Y  Comment - pt not driving at this time.  Preparing Food and eating ? N -  Using the Toilet? N -  In the past six months, have you accidently leaked urine? N -  Comment wears pads -  Do you have problems with loss of bowel control? N -  Managing your Medications? N -  Managing your Finances? N -  Housekeeping or managing your Housekeeping? N -  Some recent data might be hidden    Patient Care Team: Glean Hess, MD as PCP - General (Family Medicine) Clent Jacks, RN as Registered Nurse Lucilla Lame, MD as Consulting Physician (Gastroenterology) Lloyd Huger, MD as Consulting Physician (Oncology) Noreene Filbert, MD as Referring Physician (Radiation Oncology)    Assessment:     Exercise Activities and Dietary recommendations Current Exercise Habits: The patient does not participate in regular exercise at present, Exercise limited by: respiratory conditions(s)  Goals    . Increase water intake          Recommend drinking at least 5-6 glasses of water a day      Fall Risk Fall Risk  05/16/2017 02/24/2017 02/20/2016 11/17/2015 09/02/2015  Falls in the past year? No No No No No   Depression Screen PHQ 2/9 Scores 05/16/2017 02/24/2017 02/20/2016 11/17/2015  PHQ - 2 Score 0 0 0 0  PHQ- 9 Score - - - -     Cognitive Function     6CIT Screen 05/16/2017  What Year? 0 points  What month? 0 points  What time? 0 points  Count back from 20 0 points  Months in reverse 0 points  Repeat phrase 0 points  Total Score 0    Immunization History  Administered Date(s) Administered  . Pneumococcal Conjugate-13 10/29/2014  . Pneumococcal Polysaccharide-23 07/22/2010  .  Tdap 09/02/2015   Screening Tests Health Maintenance  Topic Date Due  . MAMMOGRAM  08/20/2017 (Originally 08/20/2016)  . DEXA SCAN  08/20/2017 (Originally 07/27/2010)  . INFLUENZA VACCINE  09/22/2023 (Originally 04/20/2017)  . TETANUS/TDAP  09/01/2025  . COLONOSCOPY  11/05/2025  . Hepatitis C Screening  Completed  . PNA vac Low Risk Adult  Completed  Plan:     I have personally reviewed and addressed the Medicare Annual Wellness questionnaire and have noted the following in the patient's chart:  A. Medical and social history B. Use of alcohol, tobacco or illicit drugs  C. Current medications and supplements D. Functional ability and status E.  Nutritional status F.  Physical activity G. Advance directives H. List of other physicians I.  Hospitalizations, surgeries, and ER visits in previous 12 months J.  [place] such as hearing and vision if needed, cognitive and depression L. Referrals and appointments   In addition, I have reviewed and discussed with patient certain preventive protocols, quality metrics, and best practice recommendations. A written personalized care plan for preventive services as well as general preventive health recommendations were provided to patient.   Signed,  Tyler Aas, LPN Nurse Health Advisor   MD Recommendations: Patient states her SOB is worsening and doesn't feel that her inhalers are working as well anymore. She does not currently have a follow up appt with you, I will have her schedule an appt to follow up with you.

## 2017-05-16 NOTE — Patient Instructions (Addendum)
Ms. Carol Gay , Thank you for taking time to come for your Medicare Wellness Visit. I appreciate your ongoing commitment to your health goals. Please review the following plan we discussed and let me know if I can assist you in the future.   Screening recommendations/referrals: Colonoscopy:  Completed 12/19/2015 Mammogram: completed 08/20/2014, due now- please call and schedule this  Bone Density: due now- please call to schedule this  Recommended yearly ophthalmology/optometry visit for glaucoma screening and checkup Recommended yearly dental visit for hygiene and checkup  Vaccinations: Influenza vaccine: due now- declined  Pneumococcal vaccine: up to date Tdap vaccine: up to date Shingles vaccine: due, check with your insurance company for coverage  Advanced directives: Please bring a copy of your health care power of attorney and living will to the office at your convenience.  Conditions/risks identified: Recommend drinking at least 5-6 glasses of water a day  Next appointment: Follow up in one year for your annual wellness exam.    Preventive Care 65 Years and Older, Female Preventive care refers to lifestyle choices and visits with your health care provider that can promote health and wellness. What does preventive care include?  A yearly physical exam. This is also called an annual well check.  Dental exams once or twice a year.  Routine eye exams. Ask your health care provider how often you should have your eyes checked.  Personal lifestyle choices, including:  Daily care of your teeth and gums.  Regular physical activity.  Eating a healthy diet.  Avoiding tobacco and drug use.  Limiting alcohol use.  Practicing safe sex.  Taking low-dose aspirin every day.  Taking vitamin and mineral supplements as recommended by your health care provider. What happens during an annual well check? The services and screenings done by your health care provider during your annual  well check will depend on your age, overall health, lifestyle risk factors, and family history of disease. Counseling  Your health care provider may ask you questions about your:  Alcohol use.  Tobacco use.  Drug use.  Emotional well-being.  Home and relationship well-being.  Sexual activity.  Eating habits.  History of falls.  Memory and ability to understand (cognition).  Work and work Statistician.  Reproductive health. Screening  You may have the following tests or measurements:  Height, weight, and BMI.  Blood pressure.  Lipid and cholesterol levels. These may be checked every 5 years, or more frequently if you are over 63 years old.  Skin check.  Lung cancer screening. You may have this screening every year starting at age 42 if you have a 30-pack-year history of smoking and currently smoke or have quit within the past 15 years.  Fecal occult blood test (FOBT) of the stool. You may have this test every year starting at age 30.  Flexible sigmoidoscopy or colonoscopy. You may have a sigmoidoscopy every 5 years or a colonoscopy every 10 years starting at age 24.  Hepatitis C blood test.  Hepatitis B blood test.  Sexually transmitted disease (STD) testing.  Diabetes screening. This is done by checking your blood sugar (glucose) after you have not eaten for a while (fasting). You may have this done every 1-3 years.  Bone density scan. This is done to screen for osteoporosis. You may have this done starting at age 21.  Mammogram. This may be done every 1-2 years. Talk to your health care provider about how often you should have regular mammograms. Talk with your health care provider about  your test results, treatment options, and if necessary, the need for more tests. Vaccines  Your health care provider may recommend certain vaccines, such as:  Influenza vaccine. This is recommended every year.  Tetanus, diphtheria, and acellular pertussis (Tdap, Td) vaccine.  You may need a Td booster every 10 years.  Zoster vaccine. You may need this after age 66.  Pneumococcal 13-valent conjugate (PCV13) vaccine. One dose is recommended after age 52.  Pneumococcal polysaccharide (PPSV23) vaccine. One dose is recommended after age 85. Talk to your health care provider about which screenings and vaccines you need and how often you need them. This information is not intended to replace advice given to you by your health care provider. Make sure you discuss any questions you have with your health care provider. Document Released: 10/03/2015 Document Revised: 05/26/2016 Document Reviewed: 07/08/2015 Elsevier Interactive Patient Education  2017 [place] Prevention in the Home Falls can cause injuries. They can happen to people of all ages. There are many things you can do to make your home safe and to help prevent falls. What can I do on the outside of my home?  Regularly fix the edges of walkways and driveways and fix any cracks.  Remove anything that might make you trip as you walk through a door, such as a raised step or threshold.  Trim any bushes or trees on the path to your home.  Use bright outdoor lighting.  Clear any walking paths of anything that might make someone trip, such as rocks or tools.  Regularly check to see if handrails are loose or broken. Make sure that both sides of any steps have handrails.  Any raised decks and porches should have guardrails on the edges.  Have any leaves, snow, or ice cleared regularly.  Use sand or salt on walking paths during winter.  Clean up any spills in your garage right away. This includes oil or grease spills. What can I do in the bathroom?  Use night lights.  Install grab bars by the toilet and in the tub and shower. Do not use towel bars as grab bars.  Use non-skid mats or decals in the tub or shower.  If you need to sit down in the shower, use a plastic, non-slip stool.  Keep the  floor dry. Clean up any water that spills on the floor as soon as it happens.  Remove soap buildup in the tub or shower regularly.  Attach bath mats securely with double-sided non-slip rug tape.  Do not have throw rugs and other things on the floor that can make you trip. What can I do in the bedroom?  Use night lights.  Make sure that you have a light by your bed that is easy to reach.  Do not use any sheets or blankets that are too big for your bed. They should not hang down onto the floor.  Have a firm chair that has side arms. You can use this for support while you get dressed.  Do not have throw rugs and other things on the floor that can make you trip. What can I do in the kitchen?  Clean up any spills right away.  Avoid walking on wet floors.  Keep items that you use a lot in easy-to-reach places.  If you need to reach something above you, use a strong step stool that has a grab bar.  Keep electrical cords out of the way.  Do not use floor polish or wax  that makes floors slippery. If you must use wax, use non-skid floor wax.  Do not have throw rugs and other things on the floor that can make you trip. What can I do with my stairs?  Do not leave any items on the stairs.  Make sure that there are handrails on both sides of the stairs and use them. Fix handrails that are broken or loose. Make sure that handrails are as long as the stairways.  Check any carpeting to make sure that it is firmly attached to the stairs. Fix any carpet that is loose or worn.  Avoid having throw rugs at the top or bottom of the stairs. If you do have throw rugs, attach them to the floor with carpet tape.  Make sure that you have a light switch at the top of the stairs and the bottom of the stairs. If you do not have them, ask someone to add them for you. What else can I do to help prevent falls?  Wear shoes that:  Do not have high heels.  Have rubber bottoms.  Are comfortable and fit  you well.  Are closed at the toe. Do not wear sandals.  If you use a stepladder:  Make sure that it is fully opened. Do not climb a closed stepladder.  Make sure that both sides of the stepladder are locked into place.  Ask someone to hold it for you, if possible.  Clearly mark and make sure that you can see:  Any grab bars or handrails.  First and last steps.  Where the edge of each step is.  Use tools that help you move around (mobility aids) if they are needed. These include:  Canes.  Walkers.  Scooters.  Crutches.  Turn on the lights when you go into a dark area. Replace any light bulbs as soon as they burn out.  Set up your furniture so you have a clear path. Avoid moving your furniture around.  If any of your floors are uneven, fix them.  If there are any pets around you, be aware of where they are.  Review your medicines with your doctor. Some medicines can make you feel dizzy. This can increase your chance of falling. Ask your doctor what other things that you can do to help prevent falls. This information is not intended to replace advice given to you by your health care provider. Make sure you discuss any questions you have with your health care provider. Document Released: 07/03/2009 Document Revised: 02/12/2016 Document Reviewed: 10/11/2014 Elsevier Interactive Patient Education  2017 [employer].

## 2017-05-19 ENCOUNTER — Ambulatory Visit
Admission: RE | Admit: 2017-05-19 | Discharge: 2017-05-19 | Disposition: A | Discharge status: Home / Self Care | Payer: Medicare HMO | Source: Ambulatory Visit | Attending: Internal Medicine | Admitting: Internal Medicine

## 2017-05-19 ENCOUNTER — Ambulatory Visit (INDEPENDENT_AMBULATORY_CARE_PROVIDER_SITE_OTHER): Payer: Medicare HMO | Admitting: Internal Medicine

## 2017-05-19 ENCOUNTER — Encounter: Payer: Self-pay | Admitting: Internal Medicine

## 2017-05-19 VITALS — BP 132/66 | HR 81 | Ht 62.0 in | Wt 160.0 lb

## 2017-05-19 DIAGNOSIS — L729 Follicular cyst of the skin and subcutaneous tissue, unspecified: Secondary | ICD-10-CM

## 2017-05-19 DIAGNOSIS — I10 Essential (primary) hypertension: Secondary | ICD-10-CM

## 2017-05-19 DIAGNOSIS — J439 Emphysema, unspecified: Secondary | ICD-10-CM | POA: Diagnosis not present

## 2017-05-19 DIAGNOSIS — I700 Atherosclerosis of aorta: Secondary | ICD-10-CM | POA: Diagnosis not present

## 2017-05-19 DIAGNOSIS — R0602 Shortness of breath: Secondary | ICD-10-CM | POA: Diagnosis not present

## 2017-05-19 MED ORDER — FLUTICASONE-UMECLIDIN-VILANT 100-62.5-25 MCG/INH IN AEPB: 1.0000 | INHALATION_SPRAY | Freq: Every day | RESPIRATORY_TRACT | 0 refills | Status: DC

## 2017-05-19 NOTE — Progress Notes (Signed)
Date:  05/19/2017   Name:  Carol Gay   DOB:  09-Aug-1946   MRN:  [id number]   Chief Complaint: Breathing Problem (Getting worse. Noticing it when walking just short distance instead of long distance now. Can't make it to mailbox and back anymore. Patient is using both inhalers. Has oxygen for at home purposes but cannot carry around- too heavy.) and Cyst (Knot is on lower Left leg- been there for several months. Not painful- its just there. ) COPD - gradually worsening over the past few months.  Using O2 at home with a concentrator.  Has a tank for outside the home but it is too heavy for her. She is using Anoro with some benefit.  She also has albuterol MDI which she uses 203 times per day.  [facility] tried to give her a small portable concentrator but she was unable to draw a deep enough breath to initiate flow. Breathing Problem  She complains of difficulty breathing and shortness of breath. There is no cough or wheezing. Pertinent negatives include no chest pain or headaches. Her symptoms are alleviated by beta-agonist. She reports minimal (that does not last) improvement on treatment. Her past medical history is significant for COPD.  Mass - noted in left leg and arm - soft and nontender.  No trauma and no change in size.  Only hurts on the left arm when blood pressure cuff inflated.  Review of Systems  Constitutional: Negative for chills, fatigue and unexpected weight change.  Respiratory: Positive for shortness of breath. Negative for cough, chest tightness and wheezing.   Cardiovascular: Negative for chest pain, palpitations and leg swelling.  Gastrointestinal: Negative for abdominal pain.  Genitourinary: Negative for dysuria and urgency.  Skin:       Cysts on arm and one on leg  Neurological: Negative for dizziness and headaches.    Patient Active Problem List   Diagnosis Date Noted  . HPV (human papilloma virus) anogenital infection 07/07/2016  . Edema extremities  05/05/2016  . Weakness 02/08/2016  . 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 Blood in stool   . Benign neoplasm of transverse colon   . Benign neoplasm of rectosigmoid junction   . Fistula of intestine, excluding rectum and anus   . Pulmonary emphysema ([place]) 10/24/2015  . Encounter for long-term (current) use of medications 02/28/2015  . Aortic atherosclerosis ([place]) 12/30/2014  . CAD in native artery 12/30/2014  . DDD (degenerative disc disease), lumbar 12/30/2014  . Dyslipidemia 12/30/2014  . Essential (primary) hypertension 12/30/2014  . Dermatophytoses 12/30/2014  . Compulsive tobacco user syndrome 12/30/2014    Prior to Admission medications   Medication Sig Start Date End Date Taking? Authorizing Provider  acetaminophen (TYLENOL) 500 MG tablet Take 1,000 mg by mouth every 6 (six) hours as needed.   Yes [provider]  albuterol (PROVENTIL HFA;VENTOLIN HFA) 108 (90 Base) MCG/ACT inhaler Inhale 2 puffs into the lungs every 6 (six) hours as needed for wheezing or shortness of breath. 12/29/16  Yes Glean Hess, MD  ALPRAZolam Duanne Moron) 0.25 MG tablet Take 1 tablet (0.25 mg total) by mouth 2 (two) times daily as needed for anxiety. 08/02/16  Yes Lloyd Huger, MD  amLODipine (NORVASC) 5 MG tablet Take 1 tablet (5 mg total) by mouth daily. 12/29/16  Yes Glean Hess, MD  ANORO ELLIPTA 62.5-25 MCG/INH AEPB INHALE 1 PUFF INTO THE LUNGS DAILY. 10/15/16  Yes Glean Hess, MD  atorvastatin (LIPITOR)  20 MG tablet TAKE 1 TABLET AT BEDTIME 08/03/16  Yes Glean Hess, MD  OXYGEN Inhale 2.5 L/min into the lungs continuous.    Yes [provider]    Allergies  Allergen Reactions  . Aspirin Nausea And Vomiting  . Codeine Nausea And Vomiting  . Morphine And Related Nausea And Vomiting    Past Surgical History:  Procedure Laterality Date  . APPENDECTOMY    . BREAST BIOPSY Left 1970's  . COLONOSCOPY WITH PROPOFOL N/A 11/06/2015   Procedure:  COLONOSCOPY WITH PROPOFOL;  Surgeon: Lucilla Lame, MD;  Location: [facility];  Service: Endoscopy;  Laterality: N/A;  LEAVE PT EARLY  . EXPLORATORY LAPAROTOMY    . LESION EXCISION  11/03/15   [facility] - Dr Dahlia Byes, [facility]  . LESION REMOVAL  1970's   from tailbone  . [place] SURGERY  1997  . OVARIAN CYST REMOVAL Left   . POLYPECTOMY  11/06/2015   Procedure: POLYPECTOMY;  Surgeon: Lucilla Lame, MD;  Location: [facility];  Service: Endoscopy;;  . PORTACATH PLACEMENT N/A 12/10/2015   Procedure: INSERTION PORT-A-CATH;  Surgeon: Jules Husbands, MD;  Location: [facility];  Service: General;  Laterality: N/A;  . RECTAL EXAM UNDER ANESTHESIA N/A 06/23/2016   Procedure: RECTAL EXAM UNDER ANESTHESIA;  Surgeon: Jules Husbands, MD;  Location: [facility];  Service: General;  Laterality: N/A;  . VEIN LIGATION AND STRIPPING      Social History  Substance Use Topics  . Smoking status: Former Smoker    Packs/day: 1.00    Years: 57.00    Types: Cigarettes    Quit date: 03/30/2016  . Smokeless tobacco: Never Used     Comment: Quit in July 2017  . Alcohol use No     Medication list has been reviewed and updated.  PHQ 2/9 Scores 05/16/2017 02/24/2017 02/20/2016 11/17/2015  PHQ - 2 Score 0 0 0 0  PHQ- 9 Score - - - -    Physical Exam  Constitutional: She is oriented to person, place, and time. She appears well-developed. No distress.  HENT:  Head: Normocephalic and atraumatic.  Neck: Normal range of motion. No thyromegaly present.  Cardiovascular: Normal rate, regular rhythm and normal heart sounds.   Pulmonary/Chest: Effort normal. No respiratory distress. She has decreased breath sounds. She has no wheezes. She has no rhonchi.  Abdominal: Soft. Bowel sounds are normal.  Musculoskeletal: She exhibits no edema.  Neurological: She is alert and oriented to person, place, and time.  Skin: Skin is warm and dry. No rash noted.     Psychiatric: She has a normal mood and affect. Her behavior is  normal. Thought content normal.  Nursing note and vitals reviewed.   BP 132/66   Pulse 81   Ht 5\' 2"  (1.575 m)   Wt 160 lb (72.6 kg)   SpO2 (!) 86%   BMI 29.26 kg/m   Assessment and Plan: 1. Pulmonary emphysema, unspecified emphysema type ([place]) Will change Anoro to Trelegy (samples given) Try again with portable concentrator if breathing improves otherwise - Fluticasone-Umeclidin-Vilant (TRELEGY ELLIPTA) 100-62.5-25 MCG/INH AEPB; Inhale 1 puff into the lungs daily.  Dispense: 3 each; Refill: 0 - DG Chest 2 View; Future  2. Essential (primary) hypertension controlled  3. Cyst of subcutaneous tissue Appears benign - follow up if worsening   Meds ordered this encounter  Medications  . Fluticasone-Umeclidin-Vilant (TRELEGY ELLIPTA) 100-62.5-25 MCG/INH AEPB    Sig: Inhale 1 puff into the lungs daily.  Dispense:  3 each    Refill:  0    Partially dictated using Editor, commissioning. Any errors are unintentional.  Halina Maidens, MD [facility]  05/19/2017

## 2017-05-19 NOTE — Patient Instructions (Signed)
Use Trelegy in place of Anoro - one puff once a day.  Rinse mouth after use.

## 2017-06-06 ENCOUNTER — Ambulatory Visit: Payer: Self-pay

## 2017-06-06 ENCOUNTER — Other Ambulatory Visit: Payer: Self-pay

## 2017-06-10 ENCOUNTER — Other Ambulatory Visit: Payer: Self-pay

## 2017-06-10 DIAGNOSIS — J439 Emphysema, unspecified: Secondary | ICD-10-CM

## 2017-06-10 MED ORDER — FLUTICASONE-UMECLIDIN-VILANT 100-62.5-25 MCG/INH IN AEPB: 1.0000 | INHALATION_SPRAY | Freq: Every day | RESPIRATORY_TRACT | 3 refills | Status: DC

## 2017-06-13 DIAGNOSIS — J441 Chronic obstructive pulmonary disease with (acute) exacerbation: Secondary | ICD-10-CM | POA: Diagnosis not present

## 2017-06-14 ENCOUNTER — Telehealth: Payer: Self-pay

## 2017-06-14 NOTE — Telephone Encounter (Signed)
Patient called stating having muscle cramps so bad that legs are sore. Wanted to see if something could be called in.  Spoke with Dr Army Melia, and informed her to try taking OTC magnesium. She stated she will try it, and if not better by Friday she will call back and let us know.

## 2017-06-29 ENCOUNTER — Ambulatory Visit
Admission: RE | Admit: 2017-06-29 | Discharge: 2017-06-29 | Disposition: A | Discharge status: Home / Self Care | Payer: Medicare HMO | Source: Ambulatory Visit | Attending: Oncology | Admitting: Oncology

## 2017-06-29 ENCOUNTER — Inpatient Hospital Stay: Payer: Medicare HMO | Attending: Oncology

## 2017-06-29 DIAGNOSIS — Z9221 Personal history of antineoplastic chemotherapy: Secondary | ICD-10-CM | POA: Insufficient documentation

## 2017-06-29 DIAGNOSIS — I2510 Atherosclerotic heart disease of native coronary artery without angina pectoris: Secondary | ICD-10-CM | POA: Insufficient documentation

## 2017-06-29 DIAGNOSIS — I252 Old myocardial infarction: Secondary | ICD-10-CM | POA: Insufficient documentation

## 2017-06-29 DIAGNOSIS — Z79899 Other long term (current) drug therapy: Secondary | ICD-10-CM | POA: Insufficient documentation

## 2017-06-29 DIAGNOSIS — A630 Anogenital (venereal) warts: Secondary | ICD-10-CM | POA: Insufficient documentation

## 2017-06-29 DIAGNOSIS — Z923 Personal history of irradiation: Secondary | ICD-10-CM | POA: Diagnosis not present

## 2017-06-29 DIAGNOSIS — Z87891 Personal history of nicotine dependence: Secondary | ICD-10-CM | POA: Insufficient documentation

## 2017-06-29 DIAGNOSIS — K8020 Calculus of gallbladder without cholecystitis without obstruction: Secondary | ICD-10-CM | POA: Diagnosis not present

## 2017-06-29 DIAGNOSIS — C210 Malignant neoplasm of anus, unspecified: Secondary | ICD-10-CM

## 2017-06-29 DIAGNOSIS — I700 Atherosclerosis of aorta: Secondary | ICD-10-CM | POA: Insufficient documentation

## 2017-06-29 LAB — CREATININE, SERUM
CREATININE: 1.08 mg/dL — AB (ref 0.44–1.00)
GFR calc Af Amer: 58 mL/min — ABNORMAL LOW (ref >60)
GFR calc non Af Amer: 50 mL/min — ABNORMAL LOW (ref >60)

## 2017-06-29 MED ORDER — IOPAMIDOL (ISOVUE-300) INJECTION 61%
100.0000 mL | Freq: Once | INTRAVENOUS | Status: AC | PRN
  Administered 2017-06-29: 100 mL via INTRAVENOUS

## 2017-07-01 ENCOUNTER — Other Ambulatory Visit: Payer: Self-pay

## 2017-07-01 MED ORDER — ATORVASTATIN CALCIUM 20 MG PO TABS: 20.0000 mg | ORAL_TABLET | Freq: Every day | ORAL | 0 refills | Status: DC

## 2017-07-06 ENCOUNTER — Inpatient Hospital Stay: Payer: Medicare HMO

## 2017-07-06 ENCOUNTER — Ambulatory Visit: Payer: Medicare HMO

## 2017-07-13 DIAGNOSIS — J441 Chronic obstructive pulmonary disease with (acute) exacerbation: Secondary | ICD-10-CM | POA: Diagnosis not present

## 2017-07-20 ENCOUNTER — Other Ambulatory Visit: Payer: Self-pay | Admitting: Obstetrics and Gynecology

## 2017-07-20 ENCOUNTER — Encounter: Payer: Self-pay | Admitting: Obstetrics and Gynecology

## 2017-07-20 ENCOUNTER — Inpatient Hospital Stay (HOSPITAL_BASED_OUTPATIENT_CLINIC_OR_DEPARTMENT_OTHER): Payer: Medicare HMO | Admitting: Obstetrics and Gynecology

## 2017-07-20 VITALS — BP 144/68 | HR 87 | Temp 96.9°F | Resp 22 | Ht 62.0 in | Wt 167.4 lb

## 2017-07-20 DIAGNOSIS — Z9221 Personal history of antineoplastic chemotherapy: Secondary | ICD-10-CM | POA: Diagnosis not present

## 2017-07-20 DIAGNOSIS — Z87891 Personal history of nicotine dependence: Secondary | ICD-10-CM | POA: Diagnosis not present

## 2017-07-20 DIAGNOSIS — I2510 Atherosclerotic heart disease of native coronary artery without angina pectoris: Secondary | ICD-10-CM | POA: Diagnosis not present

## 2017-07-20 DIAGNOSIS — C210 Malignant neoplasm of anus, unspecified: Secondary | ICD-10-CM | POA: Diagnosis not present

## 2017-07-20 DIAGNOSIS — I252 Old myocardial infarction: Secondary | ICD-10-CM

## 2017-07-20 DIAGNOSIS — Z923 Personal history of irradiation: Secondary | ICD-10-CM | POA: Diagnosis not present

## 2017-07-20 DIAGNOSIS — A630 Anogenital (venereal) warts: Secondary | ICD-10-CM

## 2017-07-20 DIAGNOSIS — Z79899 Other long term (current) drug therapy: Secondary | ICD-10-CM | POA: Diagnosis not present

## 2017-07-20 NOTE — Progress Notes (Signed)
Gynecologic Oncology Consult Visit   Referring Provider: Dr. Baruch Gouty  Chief Concern: Anal cancer.  HR HPV+ PAP  Subjective:  Carol Gay is a 71 y.o. female who is seen in consultation from Dr. Grayland Ormond and Chrystal initially in 3/17 to rule out rectovaginal fistula in context of newly diagnosed anal squamous cell cancer.  No fistula was noted and vagina and cervix were normal.      3/17 HR HPV+ (PAP normal).  Colposcopy was done and was negative. Os stenotic and no ECC could be done.   She has atypical inflammatory changes in lungs and is former heavy smoker and uses supplemental oxygen.  No gyn complaints.  Stopped smoking.   Returns today for repeat PAP/HPV.  No complaints.  Cancer History Patient is a 71 year old female with a several month history of intermittent anorectal pain and slightbleeding which she initially attributed to hemorrhoids. She was found by Dr. Perrin Maltese in general surgery to have a mass in the anal canal and was set up for biopsy as well as lower endoscopy. At the time of endoscopy a frond-like villous nonobstructing mass was found in distal rectum. There is also noted to be a "fistula". Colonoscopy:  - One 4 mm polyp in the transverse colon, removed with a cold snare. Resected and retrieved. - Two 4 to 5 mm polyps at the recto-sigmoid colon, removed with a cold snare. Resected and retrieved. - Tumor in the rectum. Biopsied. - Colonic fistula. - Diverticulosis in the sigmoid colon.  Biopsy was positive for squamous cell carcinoma. CT scan demonstrated a soft tissue fullness of the anal rectal junction again suspicious for newly diagnosed rectal cancer. No evidence of metastatic disease inguinal adenopathy or liver metastasis was noted. PET CT scan 3/3 showed the hypermetabolic anal mass, but no evidence of metastatic disease.    Smoked one PPD cigarettes, and stopped in July 2017.  Underwent chemo/RT treatment with Drs Grayland Ormond and Chrystal at [facility] with  complete response.    Problem List: Patient Active Problem List   Diagnosis Date Noted  . Cyst of subcutaneous tissue 05/19/2017  . HPV (human papilloma virus) anogenital infection 07/07/2016  . Edema extremities 05/05/2016  . Weakness 02/08/2016  . 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 Blood in stool   . Benign neoplasm of transverse colon   . Benign neoplasm of rectosigmoid junction   . Fistula of intestine, excluding rectum and anus   . Pulmonary emphysema ([place]) 10/24/2015  . Encounter for long-term (current) use of medications 02/28/2015  . Aortic atherosclerosis ([place]) 12/30/2014  . CAD in native artery 12/30/2014  . DDD (degenerative disc disease), lumbar 12/30/2014  . Dyslipidemia 12/30/2014  . Essential (primary) hypertension 12/30/2014  . Dermatophytoses 12/30/2014  . Compulsive tobacco user syndrome 12/30/2014    Past Medical History: Past Medical History:  Diagnosis Date  . Aortic atherosclerosis ([place])   . Arm pain   . Asthma   . CAD in native artery   . Cancer ([place]) 09/2015   rectum  . Cigarette smoker   . Claustrophobia   . 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 COPD (chronic obstructive pulmonary disease) ([place])   . DDD (degenerative disc disease), lumbar       . Dermatophytoses   . Dyslipidemia   . H/O blood clots    left leg, veins stripped  . Hemorrhoids   . Hypertension   . Last menstrual period (LMP) > 10 days  ago 1994  . Myocardial infarction [facility])    "mild" - age 61  . Pulmonary emphysema ([place])   . Rectal bleeding   . Shortness of breath dyspnea   . Smokers' cough ([place])   . Spinal headache    with one of my children's birth  . Vertigo   . Wears dentures    has full upper and lower - doesn't wear    Past Surgical History: Past Surgical History:  Procedure Laterality Date  . APPENDECTOMY    . BREAST BIOPSY Left 1970's  . COLONOSCOPY WITH PROPOFOL N/A 11/06/2015   Procedure: COLONOSCOPY WITH  PROPOFOL;  Surgeon: Lucilla Lame, MD;  Location: [place];  Service: Endoscopy;  Laterality: N/A;  LEAVE PT EARLY  . EXPLORATORY LAPAROTOMY    . LESION EXCISION  11/03/15   Anal - Dr Dahlia Byes, [facility]  . LESION REMOVAL  1970's   from tailbone  . [place] SURGERY  1997  . OVARIAN CYST REMOVAL Left   . POLYPECTOMY  11/06/2015   Procedure: POLYPECTOMY;  Surgeon: Lucilla Lame, MD;  Location: [place];  Service: Endoscopy;;  . PORTACATH PLACEMENT N/A 12/10/2015   Procedure: INSERTION PORT-A-CATH;  Surgeon: Jules Husbands, MD;  Location: [facility];  Service: General;  Laterality: N/A;  . RECTAL EXAM UNDER ANESTHESIA N/A 06/23/2016   Procedure: RECTAL EXAM UNDER ANESTHESIA;  Surgeon: Jules Husbands, MD;  Location: [facility];  Service: General;  Laterality: N/A;  . VEIN LIGATION AND STRIPPING      OB History:  NSVD x 3  Family History: Family History  Problem Relation Age of Onset  . Cancer Mother        lymphoma  . Heart disease Mother   . Cancer Maternal Grandmother        breast  . Birth defects Maternal Grandmother     Social History: Social History   Social History  . Marital status: Widowed    Spouse name: N/A  . Number of children: N/A  . Years of education: N/A   Occupational History  . Not on file.   Social History Main Topics  . Smoking status: Former Smoker    Packs/day: 1.00    Years: 57.00    Types: Cigarettes    Quit date: 03/30/2016  . Smokeless tobacco: Never Used     Comment: Quit in July 2017  . Alcohol use No  . Drug use: No  . Sexual activity: Not on file   Other Topics Concern  . Not on file   Social History Narrative  . No narrative on file    Allergies: Allergies  Allergen Reactions  . Aspirin Nausea And Vomiting  . Codeine Nausea And Vomiting  . Morphine And Related Nausea And Vomiting    Current Medications: Current Outpatient Prescriptions  Medication Sig Dispense Refill  . acetaminophen (TYLENOL) 500 MG tablet Take  1,000 mg by mouth every 6 (six) hours as needed.    Marland Kitchen albuterol (PROVENTIL HFA;VENTOLIN HFA) 108 (90 Base) MCG/ACT inhaler Inhale 2 puffs into the lungs every 6 (six) hours as needed for wheezing or shortness of breath. 3 Inhaler 4  . ALPRAZolam (XANAX) 0.25 MG tablet Take 1 tablet (0.25 mg total) by mouth 2 (two) times daily as needed for anxiety. 60 tablet 0  . amLODipine (NORVASC) 5 MG tablet Take 1 tablet (5 mg total) by mouth daily. 90 tablet 3  . ANORO ELLIPTA 62.5-25 MCG/INH AEPB INHALE 1 PUFF INTO THE LUNGS DAILY.  180 each 1  . atorvastatin (LIPITOR) 20 MG tablet Take 1 tablet (20 mg total) by mouth at bedtime. 90 tablet 0  . Fluticasone-Umeclidin-Vilant (TRELEGY ELLIPTA) 100-62.5-25 MCG/INH AEPB Inhale 1 puff into the lungs daily. 180 each 3  . OXYGEN Inhale 2.5 L/min into the lungs continuous.      No current facility-administered medications for this visit.     Review of Systems General: negative for, fevers, chills, fatigue, changes in sleep, changes in weight or appetite Skin: negative for changes in color, texture, moles or lesions Eyes: negative for, changes in vision, pain, diplopia HEENT: negative for, change in hearing, pain, discharge, tinnitus, vertigo, voice changes, sore throat, neck masses Breasts: negative for breast lumps Pulmonary: negative for, orthopnea, productive cough Cardiac: negative for, palpitations, syncope, pain, discomfort, pressure Gastrointestinal: negative for, dysphagia, nausea, vomiting, jaundice, pain, constipation, diarrhea, hematemesis, hematochezia Genitourinary/Sexual: negative for, dysuria, discharge, hesitancy, nocturia, retention, stones, infections, STD's, incontinence Ob/Gyn: negative for, irregular bleeding, pain Musculoskeletal: negative for, pain, stiffness, swelling, range of motion limitation Hematology: negative for, easy bruising, bleeding Neurologic/Psych: negative for, headaches, seizures, paralysis, weakness, tremor, change in  gait, change in sensation, mood swings, depression, anxiety, change in memory  Objective:  Physical Examination:  BP (!) 144/68   Pulse 87   Temp (!) 96.9 F (36.1 C) (Tympanic)   Resp (!) 22   Ht 5\' 2"  (1.575 m)   Wt 167 lb 6.4 oz (75.9 kg)   BMI 30.62 kg/m    ECOG Performance Status: 2 - Symptomatic, <50% confined to bed  General appearance: alert, cooperative and appears stated age HEENT:PERRLA, neck supple with midline trachea and thyroid without masses Lymph node survey: non-palpable, axillary, inguinal, supraclavicular Cardiovascular: regular rate and rhythm Respiratory: lungs clear to auscultation Abdomen: soft, non-tender, without masses or organomegaly, no hernias and well healed incision Back: inspection of back is normal Extremities: extremities normal, atraumatic, no cyanosis or edema Skin exam - normal coloration and turgor, no rashes, no suspicious skin lesions noted. Neurological exam reveals alert, oriented, normal speech, no focal findings or movement disorder noted.  Pelvic: exam chaperoned by nurse,  EGBUS: atrophic.  No lesions.  Vagina: agglutinated just above introitus.  This was broken down manually and upper vagina normal.  Normal cervix without lesions, polyps or tenderness, It is flush with the vault.  Adnexa: normal adnexa in size, nontender and no masses; Uterus: uterus is normal size, shape, consistency and nontender; Rectal: normal     Assessment:  Anal squamous cell cancer diagnosed 3/17 s/p chemoradiation with complete response. NED  HR HPV+ 3/17 (normal PAP) with no bleeding or discharge and no cervical or vaginal lesion seen on colposcopy.  ECC not done because cervix atrophic and os stenotic.  Normal exam today.   She had lower vaginal stricture due to radiation and this was broken down manually.  Medical co-morbidities complicating care:  Oxygen requirement  Plan:   Problem List Items Addressed This Visit      Other   HPV (human papilloma  virus) anogenital infection - Primary   Relevant Orders   Pap liquid-based and HPV (high risk)     PAP/HPV repeated today.  Gave her a dilator to use qhs so that vagina will not become agglutinated again and she will RTC in 6 weeks for exam.   She will follow up with Drs. Chrystal and Finnegan for anal cancer surveillance, but rectal exam normal today.      Mellody Drown, MD  CC:  Halina Maidens  [address], [place], [place] [address] [phone]

## 2017-07-20 NOTE — Progress Notes (Signed)
Pt has cramps in left hand at times. Has some swelling in her feet at times.  Diarrhea about 1-2 days a week and she take imodium. No gyn concerns.

## 2017-07-20 NOTE — Progress Notes (Signed)
  Oncology Nurse Navigator Documentation Chaperoned pelvic exam. Provided small + vaginal dilator with lubricant. Instructed to use daily for 10 minutes to keep vaginal canal open. Directions for use are also on the box as well as care instructions. Navigator Location: [facility] (07/20/17 1100)   )Navigator Encounter Type: Follow-up Appt (07/20/17 1100)                     Patient Visit Type: GynOnc (07/20/17 1100)                              Time Spent with Patient: 15 (07/20/17 1100)

## 2017-07-26 LAB — PAP LB AND HPV HIGH-RISK
HPV, HIGH-RISK: POSITIVE — AB
PAP SMEAR COMMENT: 0

## 2017-08-13 DIAGNOSIS — J441 Chronic obstructive pulmonary disease with (acute) exacerbation: Secondary | ICD-10-CM | POA: Diagnosis not present

## 2017-08-19 ENCOUNTER — Ambulatory Visit: Payer: Self-pay | Admitting: Internal Medicine

## 2017-08-22 ENCOUNTER — Ambulatory Visit: Payer: Medicare HMO | Admitting: Internal Medicine

## 2017-08-22 ENCOUNTER — Ambulatory Visit
Admission: RE | Admit: 2017-08-22 | Discharge: 2017-08-22 | Disposition: A | Discharge status: Home / Self Care | Payer: Medicare HMO | Source: Ambulatory Visit | Attending: Internal Medicine | Admitting: Internal Medicine

## 2017-08-22 ENCOUNTER — Encounter: Payer: Self-pay | Admitting: Internal Medicine

## 2017-08-22 VITALS — BP 132/64 | HR 86 | Ht 62.0 in | Wt 166.0 lb

## 2017-08-22 DIAGNOSIS — M810 Age-related osteoporosis without current pathological fracture: Secondary | ICD-10-CM | POA: Diagnosis not present

## 2017-08-22 DIAGNOSIS — Z780 Asymptomatic menopausal state: Secondary | ICD-10-CM | POA: Diagnosis not present

## 2017-08-22 DIAGNOSIS — Z13820 Encounter for screening for osteoporosis: Secondary | ICD-10-CM

## 2017-08-22 DIAGNOSIS — Z1239 Encounter for other screening for malignant neoplasm of breast: Secondary | ICD-10-CM

## 2017-08-22 DIAGNOSIS — I10 Essential (primary) hypertension: Secondary | ICD-10-CM

## 2017-08-22 DIAGNOSIS — Z1231 Encounter for screening mammogram for malignant neoplasm of breast: Secondary | ICD-10-CM | POA: Insufficient documentation

## 2017-08-22 DIAGNOSIS — E785 Hyperlipidemia, unspecified: Secondary | ICD-10-CM

## 2017-08-22 DIAGNOSIS — J438 Other emphysema: Secondary | ICD-10-CM

## 2017-08-22 HISTORY — DX: Personal history of antineoplastic chemotherapy: Z92.21

## 2017-08-22 HISTORY — DX: Personal history of irradiation: Z92.3

## 2017-08-22 HISTORY — DX: Malignant neoplasm of colon, unspecified: C18.9

## 2017-08-22 NOTE — Progress Notes (Signed)
Date:  08/22/2017   Name:  Carol Gay   DOB:  10-20-1945   MRN:  [id number]   Chief Complaint: Hypertension and COPD Hypertension  This is a chronic problem. The problem is unchanged. The problem is controlled. Past treatments include calcium channel blockers.  Hyperlipidemia  This is a chronic problem. Current antihyperlipidemic treatment includes statins.  COPD - now on Trelegy inhaler and seems to be doing a bit better.  Having to use it regularly now because she has a new dog that has to be walked on a leash. OP - had DEXA today showed OP at several levels.  She is not taking calcium or vitamin D.  Lab Results  Component Value Date   CREATININE 1.08 (H) 06/29/2017   BUN 28 (H) 04/06/2017   NA 135 04/06/2017   K 4.6 04/06/2017   CL 104 04/06/2017   CO2 24 04/06/2017   Lab Results  Component Value Date   CHOL 148 09/02/2015   HDL 68 09/02/2015   LDLCALC 60 09/02/2015   TRIG 98 09/02/2015   CHOLHDL 2.2 09/02/2015     Review of Systems  Patient Active Problem List   Diagnosis Date Noted  . Cyst of subcutaneous tissue 05/19/2017  . HPV (human papilloma virus) anogenital infection 07/07/2016  . Edema extremities 05/05/2016  . Weakness 02/08/2016  . 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 Blood in stool   . Benign neoplasm of transverse colon   . Benign neoplasm of rectosigmoid junction   . Fistula of intestine, excluding rectum and anus   . Pulmonary emphysema ([place]) 10/24/2015  . Encounter for long-term (current) use of medications 02/28/2015  . Aortic atherosclerosis ([place]) 12/30/2014  . CAD in native artery 12/30/2014  . DDD (degenerative disc disease), lumbar 12/30/2014  . Dyslipidemia 12/30/2014  . Essential (primary) hypertension 12/30/2014  . Dermatophytoses 12/30/2014  . Compulsive tobacco user syndrome 12/30/2014    Prior to Admission medications   Medication Sig Start Date End Date Taking? Authorizing Provider  acetaminophen  (TYLENOL) 500 MG tablet Take 1,000 mg by mouth every 6 (six) hours as needed.    [provider]  albuterol (PROVENTIL HFA;VENTOLIN HFA) 108 (90 Base) MCG/ACT inhaler Inhale 2 puffs into the lungs every 6 (six) hours as needed for wheezing or shortness of breath. 12/29/16   Glean Hess, MD  amLODipine (NORVASC) 5 MG tablet Take 1 tablet (5 mg total) by mouth daily. 12/29/16   Glean Hess, MD  atorvastatin (LIPITOR) 20 MG tablet Take 1 tablet (20 mg total) by mouth at bedtime. 07/01/17   Glean Hess, MD  Fluticasone-Umeclidin-Vilant (TRELEGY ELLIPTA) 100-62.5-25 MCG/INH AEPB Inhale 1 puff into the lungs daily. 06/10/17   Glean Hess, MD  hydrOXYzine (ATARAX/VISTARIL) 50 MG tablet Take 50 mg by mouth daily.    [provider]  loperamide (IMODIUM A-D) 2 MG tablet Take 2 mg by mouth 4 (four) times daily as needed for diarrhea or loose stools.    [provider]  OXYGEN Inhale 2.5 L/min into the lungs continuous.     [provider]    Allergies  Allergen Reactions  . Aspirin Nausea And Vomiting  . Codeine Nausea And Vomiting  . Morphine And Related Nausea And Vomiting    Past Surgical History:  Procedure Laterality Date  . APPENDECTOMY    . BREAST BIOPSY Left 1970's   neg  . COLONOSCOPY WITH PROPOFOL N/A 11/06/2015   Procedure: COLONOSCOPY WITH  PROPOFOL;  Surgeon: Lucilla Lame, MD;  Location: [place];  Service: Endoscopy;  Laterality: N/A;  LEAVE PT EARLY  . EXPLORATORY LAPAROTOMY    . LESION EXCISION  11/03/15   Anal - Dr Dahlia Byes, [facility]  . LESION REMOVAL  1970's   from tailbone  . [place] SURGERY  1997  . OVARIAN CYST REMOVAL Left   . POLYPECTOMY  11/06/2015   Procedure: POLYPECTOMY;  Surgeon: Lucilla Lame, MD;  Location: [place];  Service: Endoscopy;;  . PORTACATH PLACEMENT N/A 12/10/2015   Procedure: INSERTION PORT-A-CATH;  Surgeon: Jules Husbands, MD;  Location: [facility];  Service: General;  Laterality:  N/A;  . RECTAL EXAM UNDER ANESTHESIA N/A 06/23/2016   Procedure: RECTAL EXAM UNDER ANESTHESIA;  Surgeon: Jules Husbands, MD;  Location: [facility];  Service: General;  Laterality: N/A;  . VEIN LIGATION AND STRIPPING      Social History   Tobacco Use  . Smoking status: Former Smoker    Packs/day: 1.00    Years: 57.00    Pack years: 57.00    Types: Cigarettes    Last attempt to quit: 03/30/2016    Years since quitting: 1.3  . Smokeless tobacco: Never Used  . Tobacco comment: Quit in July 2017  Substance Use Topics  . Alcohol use: No    Alcohol/week: 0.0 oz  . Drug use: No     Medication list has been reviewed and updated.  PHQ 2/9 Scores 05/16/2017 02/24/2017 02/20/2016 11/17/2015  PHQ - 2 Score 0 0 0 0  PHQ- 9 Score - - - -    Physical Exam  Constitutional: She is oriented to person, place, and time. She appears well-developed. No distress.  HENT:  Head: Normocephalic and atraumatic.  Neck: Normal range of motion. Neck supple. No thyromegaly present.  Cardiovascular: Normal rate, regular rhythm and normal heart sounds.  Pulmonary/Chest: Effort normal and breath sounds normal. No respiratory distress. She has no wheezes. She has no rales.  Musculoskeletal: She exhibits no edema.  Neurological: She is alert and oriented to person, place, and time.  Skin: Skin is warm and dry. No rash noted.  Psychiatric: She has a normal mood and affect. Her speech is normal and behavior is normal. Thought content normal.  Nursing note and vitals reviewed.   BP 132/64   Pulse 86   Ht 5\' 2"  (1.575 m)   Wt 166 lb (75.3 kg)   SpO2 (!) 86%   BMI 30.36 kg/m   Assessment and Plan: 1. Essential (primary) hypertension controlled  2. Other emphysema ([place]) Doing well on current therapy  3. Dyslipidemia On statins  4. Age-related osteoporosis without current pathological fracture Check vitamin D level and advise Begin Calcium 1200 mg per day - VITAMIN D 25 Hydroxy (Vit-D Deficiency,  Fractures)   No orders of the defined types were placed in this encounter.   Partially dictated using Editor, commissioning. Any errors are unintentional.  Halina Maidens, MD [facility]  08/22/2017

## 2017-08-22 NOTE — Patient Instructions (Signed)
Calcium 1200 mg and Vitamin D 400 IU - daily.  Will check vitamin D level and advise if more vitamin D is needed.

## 2017-08-23 ENCOUNTER — Encounter: Payer: Self-pay | Admitting: Internal Medicine

## 2017-08-23 DIAGNOSIS — E559 Vitamin D deficiency, unspecified: Secondary | ICD-10-CM | POA: Insufficient documentation

## 2017-08-23 LAB — VITAMIN D 25 HYDROXY (VIT D DEFICIENCY, FRACTURES): Vit D, 25-Hydroxy: 7.4 ng/mL — ABNORMAL LOW (ref 30.0–100.0)

## 2017-08-31 ENCOUNTER — Ambulatory Visit: Payer: Self-pay

## 2017-09-05 ENCOUNTER — Other Ambulatory Visit: Payer: Self-pay | Admitting: Internal Medicine

## 2017-09-07 ENCOUNTER — Inpatient Hospital Stay: Payer: Medicare HMO | Attending: Obstetrics and Gynecology | Admitting: Obstetrics and Gynecology

## 2017-09-07 VITALS — BP 161/83 | HR 90 | Temp 97.2°F | Resp 18 | Ht 62.0 in | Wt 169.4 lb

## 2017-09-07 DIAGNOSIS — E785 Hyperlipidemia, unspecified: Secondary | ICD-10-CM | POA: Diagnosis not present

## 2017-09-07 DIAGNOSIS — J449 Chronic obstructive pulmonary disease, unspecified: Secondary | ICD-10-CM | POA: Insufficient documentation

## 2017-09-07 DIAGNOSIS — Z9221 Personal history of antineoplastic chemotherapy: Secondary | ICD-10-CM | POA: Diagnosis not present

## 2017-09-07 DIAGNOSIS — I10 Essential (primary) hypertension: Secondary | ICD-10-CM | POA: Diagnosis not present

## 2017-09-07 DIAGNOSIS — K5730 Diverticulosis of large intestine without perforation or abscess without bleeding: Secondary | ICD-10-CM | POA: Diagnosis not present

## 2017-09-07 DIAGNOSIS — A630 Anogenital (venereal) warts: Secondary | ICD-10-CM | POA: Insufficient documentation

## 2017-09-07 DIAGNOSIS — Z85048 Personal history of other malignant neoplasm of rectum, rectosigmoid junction, and anus: Secondary | ICD-10-CM

## 2017-09-07 DIAGNOSIS — I2510 Atherosclerotic heart disease of native coronary artery without angina pectoris: Secondary | ICD-10-CM | POA: Diagnosis not present

## 2017-09-07 DIAGNOSIS — Z79899 Other long term (current) drug therapy: Secondary | ICD-10-CM | POA: Insufficient documentation

## 2017-09-07 DIAGNOSIS — M5136 Other intervertebral disc degeneration, lumbar region: Secondary | ICD-10-CM | POA: Diagnosis not present

## 2017-09-07 DIAGNOSIS — C210 Malignant neoplasm of anus, unspecified: Secondary | ICD-10-CM

## 2017-09-07 DIAGNOSIS — Z923 Personal history of irradiation: Secondary | ICD-10-CM | POA: Diagnosis not present

## 2017-09-07 NOTE — Progress Notes (Signed)
Gynecologic Oncology Consult Visit   Referring Provider: Dr. Baruch Gouty  Chief Concern: Anal cancer.  HR HPV+ PAP  Subjective:  KEENYA MATERA is a 71 y.o. female who is seen in consultation from Dr. Grayland Ormond and Chrystal initially in 3/17 to rule out rectovaginal fistula in context of newly diagnosed anal squamous cell cancer.  No fistula was noted and vagina and cervix were normal.      3/17 HR HPV+ (PAP normal).  Colposcopy was done and was negative. Os stenotic and no ECC could be done.   She has atypical inflammatory changes in lungs and is former heavy smoker and uses supplemental oxygen.  No gyn complaints.  Stopped smoking.   Returns today to check on vaginal stenosis.  Six weeks ago broke down mid vaginal stricture manually and gave her a small dilator.  She used it for a week and then stopped because it hurt.  No complaints today.  PAP 10/18 normal, HPV +.  Cancer History Patient is a 71 year old female with a several month history of intermittent anorectal pain and slightbleeding which she initially attributed to hemorrhoids. She was found by Dr. Perrin Maltese in general surgery to have a mass in the anal canal and was set up for biopsy as well as lower endoscopy. At the time of endoscopy a frond-like villous nonobstructing mass was found in distal rectum. There is also noted to be a "fistula". Colonoscopy:  - One 4 mm polyp in the transverse colon, removed with a cold snare. Resected and retrieved. - Two 4 to 5 mm polyps at the recto-sigmoid colon, removed with a cold snare. Resected and retrieved. - Tumor in the rectum. Biopsied. - Colonic fistula. - Diverticulosis in the sigmoid colon.  Biopsy was positive for squamous cell carcinoma. CT scan demonstrated a soft tissue fullness of the anal rectal junction again suspicious for newly diagnosed rectal cancer. No evidence of metastatic disease inguinal adenopathy or liver metastasis was noted. PET CT scan 3/3 showed the hypermetabolic  anal mass, but no evidence of metastatic disease.    Smoked one PPD cigarettes, and stopped in July 2017.  Underwent chemo/RT treatment with Drs Grayland Ormond and Chrystal at [facility] with complete response.    Problem List: Patient Active Problem List   Diagnosis Date Noted  . Vitamin D deficiency 08/23/2017  . Age-related osteoporosis without current pathological fracture 08/22/2017  . Cyst of subcutaneous tissue 05/19/2017  . HPV (human papilloma virus) anogenital infection 07/07/2016  . Edema extremities 05/05/2016  . Weakness 02/08/2016  . 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 Blood in stool   . Benign neoplasm of transverse colon   . Benign neoplasm of rectosigmoid junction   . Fistula of intestine, excluding rectum and anus   . Pulmonary emphysema ([place]) 10/24/2015  . Encounter for long-term (current) use of medications 02/28/2015  . Aortic atherosclerosis ([place]) 12/30/2014  . CAD in native artery 12/30/2014  . DDD (degenerative disc disease), lumbar 12/30/2014  . Dyslipidemia 12/30/2014  . Essential (primary) hypertension 12/30/2014  . Dermatophytoses 12/30/2014  . Compulsive tobacco user syndrome 12/30/2014    Past Medical History: Past Medical History:  Diagnosis Date  . Aortic atherosclerosis ([place])   . Arm pain   . Asthma   . CAD in native artery   . Cancer ([place]) 09/2015   rectum  . Cigarette smoker   . Claustrophobia   . Colon cancer ([place])   . Congenital absence of one kidney    born with only right  kidney  . COPD (chronic obstructive pulmonary disease) ([place])   . DDD (degenerative disc disease), lumbar       . Dermatophytoses   . Dyslipidemia   . H/O blood clots    left leg, veins stripped  . Hemorrhoids   . Hypertension   . Last menstrual period (LMP) > 10 days ago 1994  . Myocardial infarction [facility])    "mild" - age 35  . Personal history of chemotherapy   . Personal history of radiation therapy   . Pulmonary emphysema ([place])   . Rectal  bleeding   . Shortness of breath dyspnea   . Smokers' cough ([place])   . Spinal headache    with one of my children's birth  . Vertigo   . Wears dentures    has full upper and lower - doesn't wear    Past Surgical History: Past Surgical History:  Procedure Laterality Date  . APPENDECTOMY    . BREAST BIOPSY Left 1970's   neg  . COLONOSCOPY WITH PROPOFOL N/A 11/06/2015   Procedure: COLONOSCOPY WITH PROPOFOL;  Surgeon: Lucilla Lame, MD;  Location: [facility];  Service: Endoscopy;  Laterality: N/A;  LEAVE PT EARLY  . EXPLORATORY LAPAROTOMY    . LESION EXCISION  11/03/15   Anal - Dr Dahlia Byes[facility]  . LESION REMOVAL  1970's   from tailbone  . [facility]GERY  1997  . OVARIAN CYST REMOVAL Left   . POLYPECTOMY  11/06/2015   Procedure: POLYPECTOMY;  Surgeon: Lucilla Lame, MD;  Location: [facility];  Service: Endoscopy;;  . PORTACATH PLACEMENT N/A 12/10/2015   Procedure: INSERTION PORT-A-CATH;  Surgeon: Jules Husbands, MD;  Location: [facility];  Service: General;  Laterality: N/A;  . RECTAL EXAM UNDER ANESTHESIA N/A 06/23/2016   Procedure: RECTAL EXAM UNDER ANESTHESIA;  Surgeon: Jules Husbands, MD;  Location: [facility];  Service: General;  Laterality: N/A;  . VEIN LIGATION AND STRIPPING      OB History:  NSVD x 3  Family History: Family History  Problem Relation Age of Onset  . Cancer Mother        lymphoma  . Heart disease Mother   . Cancer Maternal Grandmother        breast  . Birth defects Maternal Grandmother   . Breast cancer Maternal Grandmother     Social History: Social History   Socioeconomic History  . Marital status: Widowed    Spouse name: Not on file  . Number of children: Not on file  . Years of education: Not on file  . Highest education level: Not on file  Social Needs  . Financial resource strain: Not on file  . Food insecurity - worry: Not on file  . Food insecurity - inability: Not on file  . Transportation needs - medical: Not on file  .  Transportation needs - non-medical: Not on file  Occupational History  . Not on file  Tobacco Use  . Smoking status: Former Smoker    Packs/day: 1.00    Years: 57.00    Pack years: 57.00    Types: Cigarettes    Last attempt to quit: 03/30/2016    Years since quitting: 1.4  . Smokeless tobacco: Never Used  . Tobacco comment: Quit in July 2017  Substance and Sexual Activity  . Alcohol use: No    Alcohol/week: 0.0 oz  . Drug use: No  . Sexual activity: Not on file  Other Topics Concern  . Not  on file  Social History Narrative  . Not on file    Allergies: Allergies  Allergen Reactions  . Aspirin Nausea And Vomiting  . Codeine Nausea And Vomiting  . Morphine And Related Nausea And Vomiting    Current Medications: Current Outpatient Medications  Medication Sig Dispense Refill  . acetaminophen (TYLENOL) 500 MG tablet Take 1,000 mg by mouth every 6 (six) hours as needed.    Marland Kitchen albuterol (PROVENTIL HFA;VENTOLIN HFA) 108 (90 Base) MCG/ACT inhaler Inhale 2 puffs into the lungs every 6 (six) hours as needed for wheezing or shortness of breath. 3 Inhaler 4  . amLODipine (NORVASC) 5 MG tablet Take 1 tablet (5 mg total) by mouth daily. 90 tablet 3  . atorvastatin (LIPITOR) 20 MG tablet TAKE 1 TABLET AT BEDTIME 90 tablet 3  . Fluticasone-Umeclidin-Vilant (TRELEGY ELLIPTA) 100-62.5-25 MCG/INH AEPB Inhale 1 puff into the lungs daily. 180 each 3  . hydrOXYzine (ATARAX/VISTARIL) 50 MG tablet Take 50 mg by mouth 2 (two) times daily.     Marland Kitchen loperamide (IMODIUM A-D) 2 MG tablet Take 2 mg by mouth 4 (four) times daily as needed for diarrhea or loose stools.    . Multiple Vitamins-Minerals (CENTRUM SILVER PO) Take 1 tablet by mouth daily.    . OXYGEN Inhale 2.5 L/min into the lungs continuous.      No current facility-administered medications for this visit.     Review of Systems General: negative for, fevers, chills, fatigue, changes in sleep, changes in weight or appetite Skin: negative  for changes in color, texture, moles or lesions Eyes: negative for, changes in vision, pain, diplopia HEENT: negative for, change in hearing, pain, discharge, tinnitus, vertigo, voice changes, sore throat, neck masses Breasts: negative for breast lumps Pulmonary: negative for, orthopnea, productive cough Cardiac: negative for, palpitations, syncope, pain, discomfort, pressure Gastrointestinal: negative for, dysphagia, nausea, vomiting, jaundice, pain, constipation, diarrhea, hematemesis, hematochezia Genitourinary/Sexual: negative for, dysuria, discharge, hesitancy, nocturia, retention, stones, infections, STD's, incontinence Ob/Gyn: negative for, irregular bleeding, pain Musculoskeletal: negative for, pain, stiffness, swelling, range of motion limitation Hematology: negative for, easy bruising, bleeding Neurologic/Psych: negative for, headaches, seizures, paralysis, weakness, tremor, change in gait, change in sensation, mood swings, depression, anxiety, change in memory  Objective:  Physical Examination:  BP (!) 161/83   Pulse 90   Temp (!) 97.2 F (36.2 C) (Tympanic)   Resp 18   Ht 5\' 2"  (1.575 m)   Wt 169 lb 7.2 oz (76.9 kg)   BMI 30.99 kg/m    ECOG Performance Status: 2 - Symptomatic, <50% confined to bed  General appearance: alert, cooperative and appears stated age HEENT:PERRLA, neck supple with midline trachea and thyroid without masses Lymph node survey: non-palpable, axillary, inguinal, supraclavicular Cardiovascular: regular rate and rhythm Respiratory: lungs clear to auscultation Abdomen: soft, non-tender, without masses or organomegaly, no hernias and well healed incision Back: inspection of back is normal Extremities: extremities normal, atraumatic, no cyanosis or edema Skin exam - normal coloration and turgor, no rashes, no suspicious skin lesions noted. Neurological exam reveals alert, oriented, normal speech, no focal findings or movement disorder  noted.  Pelvic: exam chaperoned by nurse,  EGBUS: atrophic. No lesions.  Vagina: no recurrence of stricture.  There is some scar tissue, but caliber of vagina is good.   Normal cervix without lesions, polyps or tenderness, It is flush with the vault.  Adnexa: normal adnexa in size, nontender and no masses; Uterus: uterus is normal size, shape, consistency and nontender; Rectal: normal  Assessment:  Anal squamous cell cancer diagnosed 3/17 s/p chemoradiation with complete response. NED  HR HPV+ 3/17 (normal PAP) with no bleeding or discharge and no cervical or vaginal lesion seen on colposcopy.  ECC not done because cervix atrophic and os stenotic.  Follow up HPV 10/18 still positive (PAP normal).  She had lower vaginal stricture due to radiation and this was broken down manually six weeks ago.  She did not use the dilator, but area has not closed down again.  Medical co-morbidities complicating care:  Oxygen requirement  Plan:   Problem List Items Addressed This Visit      Digestive   Squamous cell carcinoma of anus ([place]) - Primary      She will RTC in 4 months for exam.   She will follow up with Drs. Chrystal and Finnegan for anal cancer surveillance, but rectal exam normal today.      Mellody Drown, MD  CC:  Glean Hess, [address], [place] [id number] [phone]

## 2017-09-07 NOTE — Progress Notes (Signed)
Pt tried dilator for about 1 week and it hurt every time.  She has chronic back pain but no gyn concerns.

## 2017-09-12 DIAGNOSIS — J441 Chronic obstructive pulmonary disease with (acute) exacerbation: Secondary | ICD-10-CM | POA: Diagnosis not present

## 2017-10-02 NOTE — Progress Notes (Signed)
[facility]  [phone]) [phone]  [phone]     Carol Gay DOB: 07/12/1946  MR#: [id number]  NUU#:[id number]  Patient Care Team: Glean Hess, MD as PCP - General (Family Medicine) Clent Jacks, RN as Registered Nurse Lucilla Lame, MD as Consulting Physician (Gastroenterology) Lloyd Huger, MD as Consulting Physician (Oncology) Noreene Filbert, MD as Referring Physician (Radiation Oncology)  CHIEF COMPLAINT:  Squamous cell carcinoma of the anus, stage II, T2 N0 M0.  INTERVAL HISTORY:   Patient returns to clinic today for routine 6 month evaluation. She currently feels well and is asymptomatic. She has no neurologic complaints. She denies any recent fevers or illnesses.  She does not complain of chest pain, shortness of breath, or cough today. She has a good appetite and denies weight loss. She denies any nausea, vomiting, constipation, or diarrhea. She has no changes in her bowel movements.  She denies any melena or hematochezia.  She denies any pain. She has no urinary complaints. Patient offers no specific complaints today.   REVIEW OF SYSTEMS:   Review of Systems  Constitutional: Negative for fever, malaise/fatigue and weight loss.  HENT: Negative.   Eyes: Negative.   Respiratory: Negative.  Negative for cough and shortness of breath.   Cardiovascular: Negative for chest pain and leg swelling.  Gastrointestinal: Negative for abdominal pain, blood in stool, constipation, diarrhea, melena, nausea and vomiting.  Genitourinary: Negative.   Musculoskeletal: Negative.   Skin: Negative.  Negative for rash.  Neurological: Negative for dizziness, weakness and headaches.  Endo/Heme/Allergies: Does not bruise/bleed easily.  Psychiatric/Behavioral: Negative.  The patient is not nervous/anxious.     As per HPI. Otherwise, a complete review of systems is negative.   PAST MEDICAL HISTORY: Past Medical History:  Diagnosis Date  . Aortic  atherosclerosis ([place])   . Arm pain   . Asthma   . CAD in native artery   . Cancer ([place]) 09/2015   rectum  . Cigarette smoker   . Claustrophobia   . Colon cancer ([place])   . 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 COPD (chronic obstructive pulmonary disease) ([place])   . DDD (degenerative disc disease), lumbar       . Dermatophytoses   . Dyslipidemia   . H/O blood clots    left leg, veins stripped  . Hemorrhoids   . Hypertension   . Last menstrual period (LMP) > 10 days ago 1994  . Myocardial infarction [facility])    "mild" - age 55  . Personal history of chemotherapy   . Personal history of radiation therapy   . Pulmonary emphysema ([place])   . Rectal bleeding   . Shortness of breath dyspnea   . Smokers' cough ([place])   . Spinal headache    with one of my children's birth  . Vertigo   . Wears dentures    has full upper and lower - doesn't wear    PAST SURGICAL HISTORY: Past Surgical History:  Procedure Laterality Date  . APPENDECTOMY    . BREAST BIOPSY Left 1970's   neg  . COLONOSCOPY WITH PROPOFOL N/A 11/06/2015   Procedure: COLONOSCOPY WITH PROPOFOL;  Surgeon: Lucilla Lame, MD;  Location: [facility];  Service: Endoscopy;  Laterality: N/A;  LEAVE PT EARLY  . EXPLORATORY LAPAROTOMY    . LESION EXCISION  11/03/15   Anal - Dr Dahlia Byes[facility]  . LESION REMOVAL  1970's   from tailbone  .  [place] SURGERY  1997  . OVARIAN CYST REMOVAL Left   . POLYPECTOMY  11/06/2015   Procedure: POLYPECTOMY;  Surgeon: Lucilla Lame, MD;  Location: [facility];  Service: Endoscopy;;  . PORTACATH PLACEMENT N/A 12/10/2015   Procedure: INSERTION PORT-A-CATH;  Surgeon: Jules Husbands, MD;  Location: [facility];  Service: General;  Laterality: N/A;  . RECTAL EXAM UNDER ANESTHESIA N/A 06/23/2016   Procedure: RECTAL EXAM UNDER ANESTHESIA;  Surgeon: Jules Husbands, MD;  Location: [facility];  Service: General;  Laterality: N/A;  . VEIN LIGATION AND STRIPPING       FAMILY HISTORY Family History  Problem Relation Age of Onset  . Cancer Mother        lymphoma  . Heart disease Mother   . Cancer Maternal Grandmother        breast  . Birth defects Maternal Grandmother   . Breast cancer Maternal Grandmother     GYNECOLOGIC HISTORY:  No LMP recorded. Patient is postmenopausal.     ADVANCED DIRECTIVES:    HEALTH MAINTENANCE: Social History   Tobacco Use  . Smoking status: Former Smoker    Packs/day: 1.00    Years: 57.00    Pack years: 57.00    Types: Cigarettes    Last attempt to quit: 03/30/2016    Years since quitting: 1.5  . Smokeless tobacco: Never Used  . Tobacco comment: Quit in July 2017  Substance Use Topics  . Alcohol use: No    Alcohol/week: 0.0 oz  . Drug use: No     Allergies  Allergen Reactions  . Aspirin Nausea And Vomiting  . Codeine Nausea And Vomiting  . Morphine And Related Nausea And Vomiting    Current Outpatient Medications  Medication Sig Dispense Refill  . acetaminophen (TYLENOL) 500 MG tablet Take 1,000 mg by mouth every 6 (six) hours as needed.    Marland Kitchen albuterol (PROVENTIL HFA;VENTOLIN HFA) 108 (90 Base) MCG/ACT inhaler Inhale 2 puffs into the lungs every 6 (six) hours as needed for wheezing or shortness of breath. 3 Inhaler 4  . amLODipine (NORVASC) 5 MG tablet Take 1 tablet (5 mg total) by mouth daily. 90 tablet 3  . atorvastatin (LIPITOR) 20 MG tablet TAKE 1 TABLET AT BEDTIME 90 tablet 3  . Fluticasone-Umeclidin-Vilant (TRELEGY ELLIPTA) 100-62.5-25 MCG/INH AEPB Inhale 1 puff into the lungs daily. 180 each 3  . hydrOXYzine (ATARAX/VISTARIL) 50 MG tablet Take 50 mg by mouth 2 (two) times daily.     Marland Kitchen loperamide (IMODIUM A-D) 2 MG tablet Take 2 mg by mouth 4 (four) times daily as needed for diarrhea or loose stools.    . Multiple Vitamins-Minerals (CENTRUM SILVER PO) Take 1 tablet by mouth daily.    . OXYGEN Inhale 2.5 L/min into the lungs continuous.      No current facility-administered medications  for this visit.     OBJECTIVE: BP (!) 159/92   Pulse 89   Temp (!) 97.4 F (36.3 C) (Tympanic)   Resp 20   Wt 172 lb 9.6 oz (78.3 kg)   BMI 31.57 kg/m    Body mass index is 31.57 kg/m.    ECOG FS:0 - Asymptomatic  General: Well-developed, well-nourished, no acute distress. Eyes: Pink conjunctiva, anicteric sclera. Lungs: Clear to auscultation bilaterally. Heart: Regular rate and rhythm. No rubs, murmurs, or gallops. Abdomen: Soft, nontender, nondistended. No organomegaly noted, normoactive bowel sounds. Musculoskeletal: mild nonpitting edema bilateral feet.  Neuro: Alert, answering all questions appropriately. Cranial nerves grossly intact. Skin: No rashes or petechiae noted. Psych: Normal affect.   LAB RESULTS:  Appointment on 10/05/2017  Component Date Value Ref Range Status  . Sodium 10/05/2017 136  135 - 145 mmol/L Final  . Potassium 10/05/2017 4.3  3.5 - 5.1 mmol/L Final  . Chloride 10/05/2017 105  101 - 111 mmol/L Final  . CO2 10/05/2017 24  22 - 32 mmol/L Final  . Glucose, Bld 10/05/2017 102* 65 - 99 mg/dL Final  . BUN 10/05/2017 32* 6 - 20 mg/dL Final  . Creatinine, Ser 10/05/2017 0.97  0.44 - 1.00 mg/dL Final  . Calcium 10/05/2017 9.8  8.9 - 10.3 mg/dL Final  . Total Protein 10/05/2017 8.3* 6.5 - 8.1 g/dL Final  . Albumin 10/05/2017 4.1  3.5 - 5.0 g/dL Final  . AST 10/05/2017 24  15 - 41 U/L Final  . ALT 10/05/2017 18  14 - 54 U/L Final  . Alkaline Phosphatase 10/05/2017 96  38 - 126 U/L Final  . Total Bilirubin 10/05/2017 0.5  0.3 - 1.2 mg/dL Final  . GFR calc non Af Amer 10/05/2017 57* >60 mL/min Final  . GFR calc Af Amer 10/05/2017 >60  >60 mL/min Final   Comment: (NOTE) The eGFR has been calculated using the CKD EPI equation. This calculation has not been validated in all clinical situations. eGFR's persistently <60 mL/min signify possible Chronic Kidney Disease.   Georgiann Hahn gap 10/05/2017 7  5 - 15 Final   Performed at [facility],  [address] [place], [place] [address]  . WBC 10/05/2017 7.1  3.6 - 11.0 K/uL Final  . RBC 10/05/2017 4.83  3.80 - 5.20 MIL/uL Final  . Hemoglobin 10/05/2017 14.6  12.0 - 16.0 g/dL Final  . HCT 10/05/2017 44.3  35.0 - 47.0 % Final  . MCV 10/05/2017 91.8  80.0 - 100.0 fL Final  . MCH 10/05/2017 30.2  26.0 - 34.0 pg Final  . MCHC 10/05/2017 32.9  32.0 - 36.0 g/dL Final  . RDW 10/05/2017 15.2* 11.5 - 14.5 % Final  . Platelets 10/05/2017 236  150 - 440 K/uL Final  . Neutrophils Relative % 10/05/2017 66  % Final  . Neutro Abs 10/05/2017 4.7  1.4 - 6.5 K/uL Final  . Lymphocytes Relative 10/05/2017 22  % Final  . Lymphs Abs 10/05/2017 1.5  1.0 - 3.6 K/uL Final  . Monocytes Relative 10/05/2017 7  % Final  . Monocytes Absolute 10/05/2017 0.5  0.2 - 0.9 K/uL Final  . Eosinophils Relative 10/05/2017 4  % Final  . Eosinophils Absolute 10/05/2017 0.3  0 - 0.7 K/uL Final  . Basophils Relative 10/05/2017 1  % Final  . Basophils Absolute 10/05/2017 0.1  0 - 0.1 K/uL Final   Performed at [facility], [address] [place], [place] [address]    STUDIES: No results found.  ASSESSMENT:  Squamous cell carcinoma of the anus, stage II, T2 N0 M0.  PLAN:    1. Squamous cell carcinoma of the anus:Diagnosed in February 2017.  CT scan results from June 29, 2017 reviewed independently with no evidence of disease.  Patient also reports a recent normal anoscope.  Per NCCN guidelines, patient will require an anoscope every 6-12 months for 3 years. Will also do abdominal and pelvic CT with contrast annually for 3 years, her next one is due in October 2019.  Patient will return to clinic several days after her next CT scan  for laboratory work and further evaluation.Continued follow-up with GI as scheduled. 2. Bilateral peripheral edema: Resolved. 3. Shortness of breath: Patient does not complain of this today.  Continue oxygen as needed.  Approximately 20 minutes was spent in discussion of which  greater than 50% was consultation.  Patient expressed understanding and was in agreement with this plan. She also understands that She can call clinic at any time with any questions, concerns, or complaints.    Squamous cell carcinoma of anus (HCC)   Staging form: Anus, AJCC 7th Edition     Clinical stage from 11/28/2015: Stage II (T2, N0, M0) - Signed by Lloyd Huger, MD on 11/28/2015    Lloyd Huger, MD 10/09/17 8:03 AM

## 2017-10-05 ENCOUNTER — Encounter: Payer: Self-pay | Admitting: Oncology

## 2017-10-05 ENCOUNTER — Other Ambulatory Visit: Payer: Self-pay

## 2017-10-05 ENCOUNTER — Inpatient Hospital Stay: Payer: Medicare HMO | Attending: Oncology | Admitting: Oncology

## 2017-10-05 ENCOUNTER — Inpatient Hospital Stay: Payer: Medicare HMO

## 2017-10-05 VITALS — BP 159/92 | HR 89 | Temp 97.4°F | Resp 20 | Wt 172.6 lb

## 2017-10-05 DIAGNOSIS — Z85048 Personal history of other malignant neoplasm of rectum, rectosigmoid junction, and anus: Secondary | ICD-10-CM | POA: Insufficient documentation

## 2017-10-05 DIAGNOSIS — J449 Chronic obstructive pulmonary disease, unspecified: Secondary | ICD-10-CM | POA: Insufficient documentation

## 2017-10-05 DIAGNOSIS — Z806 Family history of leukemia: Secondary | ICD-10-CM | POA: Diagnosis not present

## 2017-10-05 DIAGNOSIS — M5136 Other intervertebral disc degeneration, lumbar region: Secondary | ICD-10-CM | POA: Diagnosis not present

## 2017-10-05 DIAGNOSIS — C210 Malignant neoplasm of anus, unspecified: Secondary | ICD-10-CM

## 2017-10-05 DIAGNOSIS — Z87891 Personal history of nicotine dependence: Secondary | ICD-10-CM | POA: Insufficient documentation

## 2017-10-05 DIAGNOSIS — Z923 Personal history of irradiation: Secondary | ICD-10-CM

## 2017-10-05 DIAGNOSIS — F40240 Claustrophobia: Secondary | ICD-10-CM | POA: Insufficient documentation

## 2017-10-05 DIAGNOSIS — F17210 Nicotine dependence, cigarettes, uncomplicated: Secondary | ICD-10-CM | POA: Diagnosis not present

## 2017-10-05 DIAGNOSIS — I252 Old myocardial infarction: Secondary | ICD-10-CM | POA: Insufficient documentation

## 2017-10-05 DIAGNOSIS — I2510 Atherosclerotic heart disease of native coronary artery without angina pectoris: Secondary | ICD-10-CM | POA: Diagnosis not present

## 2017-10-05 DIAGNOSIS — I10 Essential (primary) hypertension: Secondary | ICD-10-CM | POA: Diagnosis not present

## 2017-10-05 DIAGNOSIS — Z9221 Personal history of antineoplastic chemotherapy: Secondary | ICD-10-CM

## 2017-10-05 DIAGNOSIS — Z803 Family history of malignant neoplasm of breast: Secondary | ICD-10-CM | POA: Diagnosis not present

## 2017-10-05 DIAGNOSIS — Z905 Acquired absence of kidney: Secondary | ICD-10-CM | POA: Insufficient documentation

## 2017-10-05 DIAGNOSIS — I700 Atherosclerosis of aorta: Secondary | ICD-10-CM | POA: Insufficient documentation

## 2017-10-05 DIAGNOSIS — E785 Hyperlipidemia, unspecified: Secondary | ICD-10-CM | POA: Insufficient documentation

## 2017-10-05 LAB — CBC WITH DIFFERENTIAL/PLATELET
BASOS ABS: 0.1 10*3/uL (ref 0–0.1)
Basophils Relative: 1 %
Eosinophils Absolute: 0.3 10*3/uL (ref 0–0.7)
Eosinophils Relative: 4 %
HEMATOCRIT: 44.3 % (ref 35.0–47.0)
Hemoglobin: 14.6 g/dL (ref 12.0–16.0)
LYMPHS ABS: 1.5 10*3/uL (ref 1.0–3.6)
LYMPHS PCT: 22 %
MCH: 30.2 pg (ref 26.0–34.0)
MCHC: 32.9 g/dL (ref 32.0–36.0)
MCV: 91.8 fL (ref 80.0–100.0)
MONO ABS: 0.5 10*3/uL (ref 0.2–0.9)
Monocytes Relative: 7 %
NEUTROS ABS: 4.7 10*3/uL (ref 1.4–6.5)
Neutrophils Relative %: 66 %
Platelets: 236 10*3/uL (ref 150–440)
RBC: 4.83 MIL/uL (ref 3.80–5.20)
RDW: 15.2 % — AB (ref 11.5–14.5)
WBC: 7.1 10*3/uL (ref 3.6–11.0)

## 2017-10-05 LAB — COMPREHENSIVE METABOLIC PANEL
ALBUMIN: 4.1 g/dL (ref 3.5–5.0)
ALT: 18 U/L (ref 14–54)
ANION GAP: 7 (ref 5–15)
AST: 24 U/L (ref 15–41)
Alkaline Phosphatase: 96 U/L (ref 38–126)
BUN: 32 mg/dL — AB (ref 6–20)
CHLORIDE: 105 mmol/L (ref 101–111)
CO2: 24 mmol/L (ref 22–32)
Calcium: 9.8 mg/dL (ref 8.9–10.3)
Creatinine, Ser: 0.97 mg/dL (ref 0.44–1.00)
GFR calc Af Amer: >60 mL/min (ref >60)
GFR calc non Af Amer: 57 mL/min — ABNORMAL LOW (ref >60)
GLUCOSE: 102 mg/dL — AB (ref 65–99)
POTASSIUM: 4.3 mmol/L (ref 3.5–5.1)
SODIUM: 136 mmol/L (ref 135–145)
TOTAL PROTEIN: 8.3 g/dL — AB (ref 6.5–8.1)
Total Bilirubin: 0.5 mg/dL (ref 0.3–1.2)

## 2017-10-05 NOTE — Progress Notes (Signed)
Patient denies any concerns today.  

## 2017-10-09 ENCOUNTER — Encounter: Payer: Self-pay | Admitting: Internal Medicine

## 2017-10-13 DIAGNOSIS — J441 Chronic obstructive pulmonary disease with (acute) exacerbation: Secondary | ICD-10-CM | POA: Diagnosis not present

## 2017-11-13 DIAGNOSIS — J441 Chronic obstructive pulmonary disease with (acute) exacerbation: Secondary | ICD-10-CM | POA: Diagnosis not present

## 2017-11-28 ENCOUNTER — Other Ambulatory Visit: Payer: Self-pay | Admitting: Internal Medicine

## 2017-12-09 ENCOUNTER — Telehealth: Payer: Self-pay

## 2017-12-09 NOTE — Telephone Encounter (Signed)
Patient called stating she see's a home nurse from [place] who checks her vitals and keeps her medications managed. They are on there last visits with her and need the visits to be re-certified. Informed her that looking in her chart- Dr Army Melia has never signed orders for this nurse and company for her before. It looks as though Dr Grayland Ormond is on the previous sheets. Informed her she needs to contact there office about re-certifying her since they have been doing previous forms. She verbalized understanding.

## 2017-12-11 DIAGNOSIS — J441 Chronic obstructive pulmonary disease with (acute) exacerbation: Secondary | ICD-10-CM | POA: Diagnosis not present

## 2017-12-16 ENCOUNTER — Telehealth: Payer: Self-pay

## 2017-12-16 ENCOUNTER — Telehealth: Payer: Self-pay | Admitting: *Deleted

## 2017-12-16 NOTE — Telephone Encounter (Signed)
Called patient to follow up with home care order with her PCP  She verbalized understanding.

## 2017-12-16 NOTE — Telephone Encounter (Signed)
Patient called stating she needs to continue to have a nurse at home. Wanted to know if we would sign the orders for her. She contacted Dr Margart Sickles ( Oncology) and he no longer treats her so told her to contact PCP. Please Advise.

## 2017-12-16 NOTE — Telephone Encounter (Signed)
She will need to see me to discuss the reason for the home health nursing.

## 2017-12-16 NOTE — Telephone Encounter (Signed)
Patient is requesting a a new order for her home health nursing.

## 2017-12-16 NOTE — Telephone Encounter (Signed)
Her treatment was nearly 2 years ago. What is she still using home health for and should her PCP sign the orders instead?

## 2017-12-19 NOTE — Telephone Encounter (Signed)
Patient informed- sent to front desk for appt to be made.

## 2017-12-26 ENCOUNTER — Telehealth: Payer: Self-pay

## 2017-12-26 NOTE — Telephone Encounter (Signed)
04/27/18 AWV rescheduled to 05/17/18

## 2017-12-26 NOTE — Telephone Encounter (Signed)
04/27/18 AWV needing to be rescheduled d/t TOO SOON (last awv 05/16/17) and program no longer available on Thurs. LVM requesting returned call.

## 2017-12-28 ENCOUNTER — Encounter: Payer: Self-pay | Admitting: Internal Medicine

## 2017-12-28 ENCOUNTER — Ambulatory Visit (INDEPENDENT_AMBULATORY_CARE_PROVIDER_SITE_OTHER): Payer: Medicare HMO | Admitting: Internal Medicine

## 2017-12-28 ENCOUNTER — Other Ambulatory Visit: Payer: Self-pay | Admitting: *Deleted

## 2017-12-28 VITALS — BP 128/64 | HR 102 | Ht 62.0 in | Wt 175.0 lb

## 2017-12-28 DIAGNOSIS — J438 Other emphysema: Secondary | ICD-10-CM | POA: Diagnosis not present

## 2017-12-28 DIAGNOSIS — Z85048 Personal history of other malignant neoplasm of rectum, rectosigmoid junction, and anus: Secondary | ICD-10-CM

## 2017-12-28 DIAGNOSIS — R040 Epistaxis: Secondary | ICD-10-CM | POA: Diagnosis not present

## 2017-12-28 DIAGNOSIS — I10 Essential (primary) hypertension: Secondary | ICD-10-CM

## 2017-12-28 DIAGNOSIS — Z9981 Dependence on supplemental oxygen: Secondary | ICD-10-CM | POA: Diagnosis not present

## 2017-12-28 NOTE — Patient Instructions (Signed)
[facility] ([place]) will be calling you for set up a date to meet.  Referring to pulmonary - someone will call you with that appointment.

## 2017-12-28 NOTE — Patient Outreach (Addendum)
[facility]) Care Management  12/28/2017  Carol Gay 1945-11-18 [id number]  [name] is a 72 year old female patient with medical history which includes essential hypertension, emphysema on continuous oxygen at home, and a history of anal cancer s/p completed chemotherapy and radiation regimen. She was referred by her primary care doctor today for assistance with management her chronic health conditions, particularly COPD/emphysema. Carol Gay was seen in the office today for treatment of epistaxis.   Management of Epistaxis - Mrs. Roher relates that her epistaxis is improved and she understands the instructions given her by her primary care provider for treatment. Carol Gay uses continuous home O2 as prescribed @ 2.5L but says she lately has been increasing her liter flow to 3 when she feels more short of breath. Carol Gay has a concentrator from which she can fill tanks and her system is supplied and maintained by [facility]. Carol Gay says her O2 delivery system does not have a water bottle or humidification system as far as she knows.   I reached out to [employer] to inquire about adding humidification to her concentrator and the representative confirmed that she would send humidification bottles to Carol Gay's home. I notified Carol Gay about this and asked her to contact us or the [place] office when they arrive so we can help her understand how to correctly apply them to her system.   Emphysema/COPD Management - Carol Gay relates today that her breathing has declined dramatically over the last few months. She told me that it took "3 times as long" to make her bed as usual because she has to stop so frequently to catch her breath or rest. She says she is eating well and having three meals/day. She has gained almost 10 pounds in the last few months and says she is unhappy about this and feels it is because she is no  longer able to be physically active. Carol Gay tells me her primary care doctor is referring her to a pulmonologist. I advised that if she has not heard from either her primary care doctor or the new specialist by next week, to contact her provider or [place] if we can help her find out about the status of her referral.   Poor Social Support/Lack of Support System - Carol Gay lives alone with her pet dog. She says her son and daughter in law live in the area but she cannot count on them for help and feels that more often she supports them. Her daughter lives in [place] and came to stay with her for 2 months during her chemotherapy and radiation treatment but has since returned to [place] to take care of her family. Carol Gay says she does not feel she has an adequate support system but thinks she's "doing okay on her own".   Film/video editor - Carol Gay says she is not experiencing acute Gay right now related to her financial situation but she is unsure about whether she has medicaid and whether she has "the kind that will help me the most". She applied for Medicaid a few years ago but says she was told she made "$100 too much". I will refer Carol Gay to our social worker to get expert help understanding her current status and eligibility for additional support and help through her insurance plan.  Lives alone - son (ETOH & 6 month surgery) & DIL; dtr in [place] (spent 2 months during chemo/xrt  but back to family now)  Medication Management - Carol Gay tells me she currently is not having difficulty paying for her medications. I reviewed medications with her and she confirms taking medications as prescribed. All of her prescriptions are filled through her [facility] mail order pharmacy with the exception of her anxiety medication which she has filled at a local drug store.   Provider appointments - Carol Gay has all her upcoming provider appointments on her calendar and at this time feels she  will be able to drive herself to appointments.   Plan: Carol Gay will contact her provider or other care team member for new or worsened symptoms or with questions or concerns.   Carol Gay will attend all scheduled appointments.   I will refer Carol Gay to our social work team.   I will follow up with Carol Gay by phone over the next few weeks.   [facility] CM Care Plan Problem One     Most Recent Value  Care Plan Problem One  Knowledge Deficits re: [place] for management of COPD/Emphysema  Role Documenting the Problem One  Care Management Coordinator  Care Plan for Problem One  Active  [facility] Long Term Goal   Over the next 45 days, patient will verbalize understanding of long term plan for management of COPD/Emphysema  [facility] Long Term Goal Start Date  12/28/17  Interventions for Problem One Long Term Goal  discussed current status w/ patient, plan as she understands it, referral to pulmonologist  [facility] CM Short Term Goal #1   Over the next 7 days, patient will verbalize receipt of contact from DME agency re: humidification for home O2  [facility] CM Short Term Goal #1 Start Date  12/28/17  Interventions for Short Term Goal #1  outreach to [place] re: humidification for home o2  [facility] CM Short Term Goal #2   Over the next 14 days, patient will verbalize receipt of contact by pulmonary specialists office re: new patient appointment  [facility] CM Short Term Goal #2 Start Date  12/28/17  Interventions for Short Term Goal #2  scheduled follow up call to inquire re: patient receipt of contact  [facility] CM Short Term Goal #3  Over the next 30 days, patient will contact provider or RNCM with questions or concerns related to management of COPD  [facility] CM Short Term Goal #3 Start Date  12/28/17  Interventions for Short Tern Goal #3  encouraged patient to call provider or RNCM with even mild changes in condition or new or worsened symptoms    [facility] CM Care Plan Problem Two     Most Recent Value  Care Plan  Problem Two  Financial Constraints and Barriers  Role Documenting the Problem Two  Care Management [place] for Problem Two  Active  Interventions for Problem Two Long Term Goal   patient interview and brief financial assessment,  LCSW referral  THN Long Term Goal  Over the next 31 days, patient will verbalize understanding of Medicaid eligibility and other financial resources available by working with LCSW  [facility] Long Term Goal Start Date  12/28/17  THN CM Short Term Goal #1   Over the next 7 days, patient will verbalize receipt of contact from LCSW  [facility] CM Short Term Goal #1 Start Date  12/28/17  Interventions for Short Term Goal #2   LCSW referral and collaboration    [facility] CM Care Plan Problem Three     Most Recent  Value  Care Plan Problem Three  Limited Support System  Role Documenting the Problem Three  Care Management Assistant  Care Plan for Problem Three  Active  THN Long Term Goal   Over the next 30 days, patient will collaborate with care team support system for long term assistance  [facility]N Long Term Goal Start Date  12/28/17  Interventions for Problem Three Long Term Goal  interviewed patient about support system,  LCSW referral and collaboration     [place] Care Management  [phone]

## 2017-12-28 NOTE — Progress Notes (Signed)
Date:  12/28/2017   Name:  Carol Gay   DOB:  01/26/1946   MRN:  [id number]   Chief Complaint: Epistaxis and Referral Epistaxis   The bleeding has been from the left nare. This is a recurrent problem. The problem occurs every several days. The problem has been rapidly worsening. The bleeding is associated with dry air (does not have humidifier on her concentrator).  Hypertension  This is a chronic problem. The problem is unchanged. The problem is controlled. Associated symptoms include shortness of breath. Pertinent negatives include no chest pain or palpitations.   COPD - sx are getting worse.  Now can barely do minimal housework. Using Treligy and albuterol MDI.  Does not have a nebulizer.   Review of Systems  Constitutional: Negative for chills, fatigue and fever.  HENT: Positive for nosebleeds.   Respiratory: Positive for shortness of breath. Negative for cough, chest tightness and wheezing.   Cardiovascular: Negative for chest pain, palpitations and leg swelling.  Gastrointestinal: Positive for diarrhea. Negative for abdominal pain and blood in stool.  Allergic/Immunologic: Negative for environmental allergies.  Hematological: Negative for adenopathy. Bruises/bleeds easily.    Patient Active Problem List   Diagnosis Date Noted  . Vitamin D deficiency 08/23/2017  . Age-related osteoporosis without current pathological fracture 08/22/2017  . Cyst of subcutaneous tissue 05/19/2017  . HPV (human papilloma virus) anogenital infection 07/07/2016  . Edema extremities 05/05/2016  . History of anal cancer 11/26/2015  . Blood in stool   . Benign neoplasm of transverse colon   . Benign neoplasm of rectosigmoid junction   . Fistula of intestine, excluding rectum and anus   . Pulmonary emphysema ([place]) 10/24/2015  . Encounter for long-term (current) use of medications 02/28/2015  . Aortic atherosclerosis ([place]) 12/30/2014  . CAD in native artery 12/30/2014  . DDD (degenerative  disc disease), lumbar 12/30/2014  . Dyslipidemia 12/30/2014  . Essential (primary) hypertension 12/30/2014  . Dermatophytoses 12/30/2014  . Compulsive tobacco user syndrome 12/30/2014    Prior to Admission medications   Medication Sig Start Date End Date Taking? Authorizing Provider  acetaminophen (TYLENOL) 500 MG tablet Take 1,000 mg by mouth every 6 (six) hours as needed.   Yes [provider]  albuterol (PROVENTIL HFA;VENTOLIN HFA) 108 (90 Base) MCG/ACT inhaler Inhale 2 puffs into the lungs every 6 (six) hours as needed for wheezing or shortness of breath. 12/29/16  Yes Glean Hess, MD  amLODipine (NORVASC) 5 MG tablet TAKE 1 TABLET EVERY DAY 11/28/17  Yes Glean Hess, MD  atorvastatin (LIPITOR) 20 MG tablet TAKE 1 TABLET AT BEDTIME 09/05/17  Yes Glean Hess, MD  Fluticasone-Umeclidin-Vilant (TRELEGY ELLIPTA) 100-62.5-25 MCG/INH AEPB Inhale 1 puff into the lungs daily. 06/10/17  Yes Glean Hess, MD  hydrOXYzine (ATARAX/VISTARIL) 50 MG tablet Take 50 mg by mouth 2 (two) times daily.    Yes [provider]  loperamide (IMODIUM A-D) 2 MG tablet Take 2 mg by mouth 4 (four) times daily as needed for diarrhea or loose stools.   Yes [provider]  Multiple Vitamins-Minerals (CENTRUM SILVER PO) Take 1 tablet by mouth daily.   Yes [provider]  OXYGEN Inhale 2.5 L/min into the lungs continuous.    Yes [provider]    Allergies  Allergen Reactions  . Aspirin Nausea And Vomiting  . Codeine Nausea And Vomiting  . Morphine And Related Nausea And Vomiting    Past Surgical History:  Procedure Laterality Date  .  APPENDECTOMY    . BREAST BIOPSY Left 1970's   neg  . COLONOSCOPY WITH PROPOFOL N/A 11/06/2015   Procedure: COLONOSCOPY WITH PROPOFOL;  Surgeon: Lucilla Lame, MD;  Location: [facility];  Service: Endoscopy;  Laterality: N/A;  LEAVE PT EARLY  . EXPLORATORY LAPAROTOMY    . LESION EXCISION  11/03/15   Anal - Dr  Dahlia Byes, [place] [facility]  . LESION REMOVAL  1970's   from tailbone  . [place] SURGERY  1997  . OVARIAN CYST REMOVAL Left   . POLYPECTOMY  11/06/2015   Procedure: POLYPECTOMY;  Surgeon: Lucilla Lame, MD;  Location: [facility];  Service: Endoscopy;;  . PORTACATH PLACEMENT N/A 12/10/2015   Procedure: INSERTION PORT-A-CATH;  Surgeon: Jules Husbands, MD;  Location: [facility];  Service: General;  Laterality: N/A;  . RECTAL EXAM UNDER ANESTHESIA N/A 06/23/2016   Procedure: RECTAL EXAM UNDER ANESTHESIA;  Surgeon: Jules Husbands, MD;  Location: [facility];  Service: General;  Laterality: N/A;  . VEIN LIGATION AND STRIPPING      Social History   Tobacco Use  . Smoking status: Former Smoker    Packs/day: 1.00    Years: 57.00    Pack years: 57.00    Types: Cigarettes    Last attempt to quit: 03/30/2016    Years since quitting: 1.7  . Smokeless tobacco: Never Used  . Tobacco comment: Quit in July 2017  Substance Use Topics  . Alcohol use: No    Alcohol/week: 0.0 oz  . Drug use: No     Medication list has been reviewed and updated.  PHQ 2/9 Scores 05/16/2017 02/24/2017 02/20/2016 11/17/2015  PHQ - 2 Score 0 0 0 0  PHQ- 9 Score - - - -    Physical Exam  Constitutional: She is oriented to person, place, and time. She appears well-developed. She appears distressed.  HENT:  Head: Normocephalic and atraumatic.  Nose: Mucosal edema and sinus tenderness present. No epistaxis.  No foreign bodies.  Cardiovascular: Normal rate, regular rhythm and normal heart sounds. Exam reveals no gallop.  Pulmonary/Chest: Effort normal. No respiratory distress. She has decreased breath sounds. She has wheezes.  Musculoskeletal: Normal range of motion. She exhibits no edema or tenderness.  Neurological: She is alert and oriented to person, place, and time.  Skin: Skin is warm and dry. No rash noted.  Psychiatric: She has a normal mood and affect. Her behavior is normal. Thought content normal.  Nursing note and  vitals reviewed.   BP 128/64   Pulse (!) 102   Ht 5\' 2"  (1.575 m)   Wt 175 lb (79.4 kg)   SpO2 (!) 75%   BMI 32.01 kg/m   Assessment and Plan: 1. Essential (primary) hypertension controlled - AMB Referral to [place] Management  2. Other emphysema ([place]) Need to see Pulmonary for optimal management - AMB Referral to [place] Management - Ambulatory referral to Pulmonology  3. History of anal cancer Seeing Oncology every 6 months but no longer needs HH - AMB Referral to [place] Management  4. Dependence on continuous supplemental oxygen - AMB Referral to [place] Management  5. Epistaxis Use saline spray  Needs humidifier   No orders of the defined types were placed in this encounter.   Partially dictated using Editor, commissioning. Any errors are unintentional.  Halina Maidens, MD [facility]  12/28/2017

## 2017-12-29 NOTE — Progress Notes (Signed)
[facility] Pulmonary Medicine Consultation      Assessment and Plan:  Severe emphysema/COPD. -Patient has very severe emphysema with dyspnea on exertion. -Patient is already maximally treated with Trelegy inhaler, she has quit smoking approximately 2 years ago. - We discussed ways in which we can try to both maximize and preserve her current lung function for as long as possible.  This includes increasing physical activity, and referral to pulmonary rehabilitation. -As patient likes to be active over the spring and summer will defer pulmonary rehab referral until the winter months when she is more inactive.  Chronic hypoxic respiratory failure. -Continue use of chronic oxygen.  Dyspnea on exertion. -Currently due to likely severe emphysema with dynamic air trapping. - Continue oxygen, I have encouraged her to buy her own pulse oximeter to check her oxygen level and use oxygen only if her oxygen saturation is above 90%. -Referral to pulmonary rehabilitation at next visit, if no improvement is noted, could consider referral for lung volume reduction surgery at a tertiary center. -She is encouraged to take her oxygen with her when traveling outside of the home as this appears to help with her dyspnea.  Currently she is not using it due to the weight of the tank.  Of her on her initial evaluation here in the office her oxygen saturation was 70% on room air.  Meds ordered this encounter  Medications  . ipratropium-albuterol (DUONEB) 0.5-2.5 (3) MG/3ML SOLN    Sig: Take 3 mLs by nebulization 3 (three) times daily.    Dispense:  360 mL    Refill:  1   Orders Placed This Encounter  Procedures  . Ambulatory Referral for DME  . Pulmonary Function Test [facility] Only   Return in about 6 months (around 07/01/2018).   Date: 12/30/2017  MRN# [id number] THEODORA LALANNE 1946/04/12    Carol Gay is a 72 y.o. old female seen in consultation for chief complaint of:    Chief Complaint    Patient presents with  . Consult    referred by Dr. Carolin Coy eval emhysema:  . Shortness of Breath    pt came in w/o oxygen 02 levels were 70 upon rooming. Pt placed on 3L and it took several minutes to recover 92%.  . Chest Pain    everyday especially with exertion.    HPI:   She notes that her breathing has been bad for several years, but past 2 years it has gotten worse. She quit smoking about a 2 yrs ago. She was going through anal cancer at that time and getting chemo and radiation at that time. She notes after that her breathing got worse around then and she was admitted to the hospital, she was discharged on oxygen and has been on it since that time.   Her breathing has been growing progressively worse, she now has dyspnea at rest, which gets better she wears the oxygen. She likes to garden and has trouble with this, so has has been using oxygen.  She is using Trelegy once daily, and albuterol about once or twice per day when she is doing something.   She went through some type of rehab after chemo about 2 yrs ago.   Imaging personally reviewed, chest x-ray 05/19/17, the lungs are severely hyperinflated consistent with severe emphysema.  **CBC 10/05/17; absolute eosinophils 300, as high as 400 on 06/29/16  PMHX:   Past Medical History:  Diagnosis Date  . Aortic atherosclerosis ([place])   . Arm pain   .  Asthma   . CAD in native artery   . Cancer ([place]) 09/2015   rectum  . Cigarette smoker   . Claustrophobia   . Colon cancer ([place])   . 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 COPD (chronic obstructive pulmonary disease) ([place])   . DDD (degenerative disc disease), lumbar       . Dermatophytoses   . Dyslipidemia   . H/O blood clots    left leg, veins stripped  . Hemorrhoids   . Hypertension   . Last menstrual period (LMP) > 10 days ago 1994  . Myocardial infarction [facility])    "mild" - age 64  . Personal history of chemotherapy   . Personal history  of radiation therapy   . Pulmonary emphysema ([place])   . Rectal bleeding   . Shortness of breath dyspnea   . Smokers' cough ([place])   . Spinal headache    with one of my children's birth  . Vertigo   . Wears dentures    has full upper and lower - doesn't wear   Surgical Hx:  Past Surgical History:  Procedure Laterality Date  . APPENDECTOMY    . BREAST BIOPSY Left 1970's   neg  . COLONOSCOPY WITH PROPOFOL N/A 11/06/2015   Procedure: COLONOSCOPY WITH PROPOFOL;  Surgeon: Lucilla Lame, MD;  Location: [facility];  Service: Endoscopy;  Laterality: N/A;  LEAVE PT EARLY  . EXPLORATORY LAPAROTOMY    . LESION EXCISION  11/03/15   Anal - Dr Dahlia Byes, [facility]  . LESION REMOVAL  1970's   from tailbone  . [place] SURGERY  1997  . OVARIAN CYST REMOVAL Left   . POLYPECTOMY  11/06/2015   Procedure: POLYPECTOMY;  Surgeon: Lucilla Lame, MD;  Location: [facility];  Service: Endoscopy;;  . PORTACATH PLACEMENT N/A 12/10/2015   Procedure: INSERTION PORT-A-CATH;  Surgeon: Jules Husbands, MD;  Location: [facility];  Service: General;  Laterality: N/A;  . RECTAL EXAM UNDER ANESTHESIA N/A 06/23/2016   Procedure: RECTAL EXAM UNDER ANESTHESIA;  Surgeon: Jules Husbands, MD;  Location: [facility];  Service: General;  Laterality: N/A;  . VEIN LIGATION AND STRIPPING     Family Hx:  Family History  Problem Relation Age of Onset  . Cancer Mother        lymphoma  . Heart disease Mother   . Cancer Maternal Grandmother        breast  . Birth defects Maternal Grandmother   . Breast cancer Maternal Grandmother    Social Hx:   Social History   Tobacco Use  . Smoking status: Former Smoker    Packs/day: 1.00    Years: 57.00    Pack years: 57.00    Types: Cigarettes    Last attempt to quit: 03/30/2016    Years since quitting: 1.7  . Smokeless tobacco: Never Used  . Tobacco comment: Quit in July 2017  Substance Use Topics  . Alcohol use: No    Alcohol/week: 0.0 oz  . Drug use: No   Medication:      Current Outpatient Medications:  .  acetaminophen (TYLENOL) 500 MG tablet, Take 1,000 mg by mouth every 6 (six) hours as needed., Disp: , Rfl:  .  albuterol (PROVENTIL HFA;VENTOLIN HFA) 108 (90 Base) MCG/ACT inhaler, Inhale 2 puffs into the lungs every 6 (six) hours as needed for wheezing or shortness of breath., Disp: 3 Inhaler, Rfl: 4 .  amLODipine (NORVASC)  5 MG tablet, TAKE 1 TABLET EVERY DAY, Disp: 90 tablet, Rfl: 3 .  atorvastatin (LIPITOR) 20 MG tablet, TAKE 1 TABLET AT BEDTIME, Disp: 90 tablet, Rfl: 3 .  Fluticasone-Umeclidin-Vilant (TRELEGY ELLIPTA) 100-62.5-25 MCG/INH AEPB, Inhale 1 puff into the lungs daily., Disp: 180 each, Rfl: 3 .  hydrOXYzine (ATARAX/VISTARIL) 50 MG tablet, Take 50 mg by mouth 2 (two) times daily. , Disp: , Rfl:  .  loperamide (IMODIUM A-D) 2 MG tablet, Take 2 mg by mouth 4 (four) times daily as needed for diarrhea or loose stools., Disp: , Rfl:  .  Multiple Vitamins-Minerals (CENTRUM SILVER PO), Take 1 tablet by mouth daily., Disp: , Rfl:  .  OXYGEN, Inhale 2.5 L/min into the lungs continuous. , Disp: , Rfl:    Allergies:  Aspirin; Codeine; and Morphine and related  Review of Systems: Gen:  Denies  fever, sweats, chills HEENT: Denies blurred vision, double vision. bleeds, sore throat Cvc:  No dizziness, chest pain. Resp:   Denies cough or sputum production, shortness of breath Gi: Denies swallowing difficulty, stomach pain. Gu:  Denies bladder incontinence, burning urine Ext:   No Joint pain, stiffness. Skin: No skin rash,  hives  Endoc:  No polyuria, polydipsia. Psych: No depression, insomnia. Other:  All other systems were reviewed with the patient and were negative other that what is mentioned in the HPI.   Physical Examination:   VS: BP 140/88 (BP Location: Left Arm, Cuff Size: Normal)   Pulse 67   Resp 16   Ht 5\' 2"  (1.575 m)   Wt 176 lb (79.8 kg)   SpO2 92%   BMI 32.19 kg/m   General Appearance: No distress  Neuro:without focal  findings,  speech normal,  HEENT: PERRLA, EOM intact.   Pulmonary: normal breath sounds, No wheezing. Decreased air entry bilaterally. ` CardiovascularNormal S1,S2.  No m/r/g.   Abdomen: Benign, Soft, non-tender. Renal:  No costovertebral tenderness  GU:  No performed at this time. Endoc: No evident thyromegaly, no signs of acromegaly. Skin:   warm, no rashes, no ecchymosis  Extremities: normal, no cyanosis, clubbing.  Other findings:    LABORATORY PANEL:   CBC No results for input(s): WBC, HGB, HCT, PLT in the last 168 hours. ------------------------------------------------------------------------------------------------------------------  Chemistries  No results for input(s): NA, K, CL, CO2, GLUCOSE, BUN, CREATININE, CALCIUM, MG, AST, ALT, ALKPHOS, BILITOT in the last 168 hours.  Invalid input(s): GFRCGP ------------------------------------------------------------------------------------------------------------------  Cardiac Enzymes No results for input(s): TROPONINI in the last 168 hours. ------------------------------------------------------------  RADIOLOGY:  No results found.     Thank  you for the consultation and for allowing [facility] to assist in the care of your patient. Our recommendations are noted above.  Please contact us if we can be of further service.   Marda Stalker, MD.  Board Certified in Internal Medicine, Pulmonary Medicine, [place], and Sleep Medicine.  [facility] Office Number: [phone]  Patricia Pesa, M.D.  Merton Border, M.D  12/30/2017

## 2017-12-30 ENCOUNTER — Encounter: Payer: Self-pay | Admitting: Internal Medicine

## 2017-12-30 ENCOUNTER — Ambulatory Visit: Payer: Medicare HMO | Admitting: Internal Medicine

## 2017-12-30 VITALS — BP 140/88 | HR 67 | Resp 16 | Ht 62.0 in | Wt 176.0 lb

## 2017-12-30 DIAGNOSIS — J449 Chronic obstructive pulmonary disease, unspecified: Secondary | ICD-10-CM

## 2017-12-30 MED ORDER — IPRATROPIUM-ALBUTEROL 0.5-2.5 (3) MG/3ML IN SOLN: 3.0000 mL | Freq: Three times a day (TID) | RESPIRATORY_TRACT | 1 refills | Status: DC

## 2017-12-30 NOTE — Patient Instructions (Addendum)
Continue trelegy inhaler.  Try to watch your pace so that you keep a sustained amount of activity without stopping.  Will send you for a lung function test.  Will send you for pulmonary rehab in the winter time.  Will start you on a nebulizer to use three times per day.

## 2018-01-03 ENCOUNTER — Other Ambulatory Visit: Payer: Self-pay | Admitting: *Deleted

## 2018-01-03 NOTE — Patient Outreach (Signed)
`  [facility]) Care Management  01/03/2018  SAMEEN LEAS Sep 06, 1946 [id number]   Return phone call from patient to address questions regarding her medicaid benefits. Per patient due to her COPD, she needs someone to assist with light housekeeping, Per patient, she does not receive full Medicaid. She receives Medicaid Part B, that covers her premiums only. Family support explored, however patient has no local family to assist her. Her daughter did come from [place] to assist. Private pay options explored as well, however patient states that she is not able to afford it at this time. Per patient, she can cook her own meals and take care of her own ADL's but is just not able to keep up the house keeping and plant her flowers. Patient states that she plans to pick up her son on the weekend to assist with her yard. Patient does not have a fhurch family however has  Local church that brings her a box of food once a month. Patient does not have a relationship with her  Neighbors. [place] [phone] discussed to be placed on their waiting  list for case management services that will assist with home care needs. Patient provided the phone number and will call. Patient verbalized having no additional community  resource needs at this time. This social worker's contact information provided if their are any additional community resource needs in the future.    Sheralyn Boatman [facility] Care Management [phone]

## 2018-01-03 NOTE — Patient Outreach (Signed)
[facility]) Care Management  01/03/2018  Carol Gay [birth year] [id number]   Patient referred by [facility] RNCM to answer qurestions regarding her medicaid status and social support. Patient did not answer. HIPPA compliant voicemail message left requesting a return call. Outreach letter to be sent.   Plan: Second outreach attempt scheduled for 01/05/18.   Sheralyn Boatman [facility] Care Management [phone]

## 2018-01-04 ENCOUNTER — Ambulatory Visit: Payer: Self-pay | Admitting: *Deleted

## 2018-01-05 ENCOUNTER — Ambulatory Visit: Payer: Self-pay | Admitting: *Deleted

## 2018-01-10 ENCOUNTER — Ambulatory Visit: Payer: Medicare HMO | Attending: Internal Medicine

## 2018-01-10 DIAGNOSIS — J439 Emphysema, unspecified: Secondary | ICD-10-CM | POA: Diagnosis not present

## 2018-01-10 DIAGNOSIS — J449 Chronic obstructive pulmonary disease, unspecified: Secondary | ICD-10-CM

## 2018-01-10 MED ORDER — ALBUTEROL SULFATE (2.5 MG/3ML) 0.083% IN NEBU: 2.5000 mg | INHALATION_SOLUTION | Freq: Once | RESPIRATORY_TRACT | Status: AC

## 2018-01-11 DIAGNOSIS — J441 Chronic obstructive pulmonary disease with (acute) exacerbation: Secondary | ICD-10-CM | POA: Diagnosis not present

## 2018-01-16 ENCOUNTER — Other Ambulatory Visit: Payer: Self-pay | Admitting: Internal Medicine

## 2018-01-16 DIAGNOSIS — J439 Emphysema, unspecified: Secondary | ICD-10-CM

## 2018-01-20 ENCOUNTER — Other Ambulatory Visit: Payer: Self-pay | Admitting: *Deleted

## 2018-01-20 NOTE — Patient Outreach (Signed)
[facility] [facility]) Care Management  01/20/2018  JEWELIA BOCCHINO 1946/09/01 [id number]  Mrs. THEREASA IANNELLO is a 72 year old female patient with medical history which includes essential hypertension, emphysema on continuous oxygen at home, and a history of anal cancer s/p completed chemotherapy and radiation regimen. She was referred by her primary care doctor today for assistance with management her chronic health conditions, particularly COPD/emphysema.    Management of Epistaxis - Mrs. Machi states she has had no further episodes of epistaxis. She uses continuous home O2 as prescribed @ 2.5L and did not have humidification until recently after I reached out to [place] to request the addition of humidification to her concentrator. She tells me today that [employer] came out 3 times before eventually her entire concentrator and O2 delivery system was changed out. She says she feels better with this new system.   Emphysema/COPD Management - Mrs. Carreira says today that she feels "a little bit better". She says she is taking precautions during the warmer days and is trying to avoid going outside during peak temperature hours. She still relates that overall she feels her pulmonary status is declined from several months ago but not worse than the last time we spoke. We discussed the importance of good nutrition and adequate calories. Mrs. Branam says she has been referred to Dr. Ashby Dawes for pulmonary care and is scheduled to see him on 01/23/18.   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 She applied for Medicaid a few years ago but says she was told she made "$100 too much". I referred Mrs. Tindel to our social work team to help her ascertain Medicaid eligibility and for for  additional support options re: home care needs (IADL). Chrystal Land LCSW spoke with Mrs. Geving and helped her understand that she does not have nor is she eligible for full Medicaid but does have Medicaid PartB which covers her premiums only. Ms. Jenita Seashore explored support systems with Mrs. Chipps and referred her to [facility] [phone] and is on the waiting list. I reviewed these items with Mrs. Schmit today and she verbalizes understanding.    Plan:   [facility] CM Care Plan Problem One     Most Recent Value  Care Plan Problem One  Knowledge Deficits re: Long Term Plan for management of COPD/Emphysema  Role Documenting the Problem One  Care Management Coordinator  Care Plan for Problem One  Active  THN Long Term Goal   Over the next 45 days, patient will verbalize understanding of long term plan for management of COPD/Emphysema  THN Long Term Goal Start Date  12/28/17  Interventions for Problem One Long Term Goal  reviewed early signs and symptoms of COPD and/or pulmonary compromise with patient and when to call provider to report  [facility] CM Short Term Goal #1   Over the next 7 days, patient will verbalize receipt of contact from DME agency re: humidification for home O2  THN CM Short Term Goal #1 Start Date  12/28/17  [facility] CM Short Term Goal #1 Met Date  01/03/18  THN CM Short Term Goal #2   Over the next 14 days, patient will verbalize receipt of contact by pulmonary specialists office re: new patient appointment  [facility] CM Short Term Goal #2  Start Date  12/28/17  THN CM Short Term Goal #2 Met Date  12/30/17  THN CM Short Term Goal #3  Over the next 30 days, patient will contact provider or RNCM with questions or concerns related to management of COPD  THN CM Short Term Goal #3 Start Date  12/28/17  [facility] CM Short Term Goal #3 Met Date  01/20/18  Interventions for Short Tern Goal #3  signs and symptoms reviewed  THN CM Short Term Goal #4  Over the next 30 days, patient will report  understanding of any changes made my pulmonary provider for COPD management  THN CM Short Term Goal #4 Start Date  01/20/18  Interventions for Short Term Goal #4  reviewed with patient questions to ask at 01/23/18 pulmonary provider appointment,  scheudled follow up appointment for review    [facility] CM Care Plan Problem Two     Most Recent Value  Care Plan Problem Two  Financial Constraints and Barriers  Role Documenting the Problem Two  Care Management Coordinator  Care Plan for Problem Two  Active  Interventions for Problem Two Long Term Goal   reviewed LCSW intervention with patient  THN Long Term Goal  Over the next 31 days, patient will verbalize understanding of Medicaid eligibility and other financial resources available by working with LCSW  [facility] Long Term Goal Start Date  12/28/17  [facility] Long Term Goal Met Date  01/20/18  THN CM Short Term Goal #1   Over the next 7 days, patient will verbalize receipt of contact from LCSW  [facility] CM Short Term Goal #1 Start Date  12/28/17  [facility] CM Short Term Goal #1 Met Date   01/03/18  Interventions for Short Term Goal #2   collaboration with LCSW    [facility] CM Care Plan Problem Three     Most Recent Value  Care Plan Problem Three  Limited Support System  Role Documenting the Problem [place] for Problem Three  Not Active  THN Long Term Goal   Over the next 30 days, patient will collaborate with care team support system for long term assistance  [facility] Long Term Goal Start Date  12/28/17      Janalyn Shy [place] Care Management  [phone]

## 2018-01-23 ENCOUNTER — Telehealth: Payer: Self-pay | Admitting: Internal Medicine

## 2018-01-23 NOTE — Telephone Encounter (Signed)
No urgent findings, confirmed emphysema/COPD. Will discuss further with her at next visit.

## 2018-01-23 NOTE — Telephone Encounter (Signed)
Patient calling for pft results

## 2018-01-23 NOTE — Telephone Encounter (Signed)
Pt wanting PFT results.

## 2018-01-24 NOTE — Telephone Encounter (Signed)
Pt informed of results. Nothing further needed.

## 2018-01-24 NOTE — Telephone Encounter (Signed)
LMOM for pt to return call. 

## 2018-02-02 ENCOUNTER — Other Ambulatory Visit: Payer: Self-pay | Admitting: *Deleted

## 2018-02-02 NOTE — Patient Outreach (Signed)
[place] [facility]) Care Management  02/02/2018  Carol Gay 1945/12/06 [id number]  [name] is a 72 year old female patient with medical history which includes essential hypertension, emphysema on continuous oxygen at home, and a history of anal cancers/p completed chemotherapy and radiation regimen. She was referred by her primary care doctor for assistance with management her chronic health conditions, particularly COPD/emphysema.   Next PCP Appointment: Carol Maidens MD 02/22/18  I was unable to reach [name] or leave a message today when I reached out by phone.   Plan: I will schedule [name] for another telephone assessment again in June.    [place] Management  [phone]

## 2018-02-10 DIAGNOSIS — J441 Chronic obstructive pulmonary disease with (acute) exacerbation: Secondary | ICD-10-CM | POA: Diagnosis not present

## 2018-02-22 ENCOUNTER — Encounter: Payer: Self-pay | Admitting: Internal Medicine

## 2018-02-28 ENCOUNTER — Other Ambulatory Visit: Payer: Self-pay | Admitting: Internal Medicine

## 2018-02-28 ENCOUNTER — Other Ambulatory Visit: Payer: Self-pay | Admitting: *Deleted

## 2018-02-28 MED ORDER — HYDROXYZINE HCL 50 MG PO TABS: 50.0000 mg | ORAL_TABLET | Freq: Two times a day (BID) | ORAL | 1 refills | Status: DC | PRN

## 2018-02-28 NOTE — Patient Outreach (Signed)
[place] [facility]) Care Management  02/28/2018  Carol Gay 1946-01-19 [id number]  Carol Gay is a 72 year old female patient with medical history which includes essential hypertension, emphysema on continuous oxygen at home, and a history of anal cancers/p completed chemotherapy and radiation regimen. She was referred by her primary care doctor for assistance with management her chronic health conditions, particularly COPD/emphysema.   Emphysema/COPD Management- Carol Gay reports today that she feels well but says that she has very little reserve and poor exercise tolerance. She is using Oxygen 2.5L/[place] continuously and taking her medications as prescribed with the exception of only using her Duoneb nebulizer twice daily rather than three times daily because she "gets really jittery". Carol Gay measures her O2 Saturation at home. She says that after exercise (walking from her kitchen to the living room, without O2) her saturation = 71%. At rest with O2 @ 2.5L/ her O2 Sat = 88%. Carol Gay says she expectorates thick white sputum about twice daily but has not noted an increase in amount of sputum nor has her sputum had color. She denies any fever.  Carol Gay last saw her pulmonary provider in May.   ADL's - Carol Gay says she continues to take precautions during the warmer days and is trying to avoid going outside during peak temperature hours. She uses a walker with a seat and states she is not afraid to stop and use it when she needs to rest. She drives herself to the mailbox every day and drives herself to the grocery store. She has a puppy at home whom she rescued from the local animal shelter in December of 2018. Carol Gay says her dog helps her keep going and gives her someone else to take care of.   Medication Management - Carol Gay reports that she has had Atarax/Vistaril on hand since she was taking chemotherapy and although it is prescribed  BID, she has only been taking 1/2 tablet qhs to help her rest. It was last filled in March and she now has a few less than 30 tablets on hand. Carol Gay asked for assistance with getting a refill for this medication. I called the pharmacy at [facility] on [address] in [place] who indicated that Carol Gay does not have refills available. I reached out to the office of Dr. Halina Maidens to inquire about refill authorization and got the answering service who agreed to leave a message for the on-call provider re: Carol Gay medication need. I returned a call during office hours and also left a message with Dr. Gaspar Cola nurse re: Carol Gay medication management need.  Plan: Carol Gay agrees to ongoing monthly health coaching.   [facility] CM Care Plan Problem One     Most Recent Value  Care Plan Problem One  Ongoing Education Needs and Support re: Self Health Management of COPD  Role Documenting the Problem One  Health Coach  Care Plan for Problem One  Active  THN Long Term Goal   Over the next 90 days patient will verbalize understanding of long term plan for self health management activities for advanced COPD  THN Long Term Goal Start Date  02/28/18  THN Long Term Goal Met Date    Interventions for Problem One Long Term Goal  reviewed plan of care, current status  THN CM Short Term Goal #1   Over the next 30 days, patient will verbalize understanding of early signs and symptoms of COPD exacerbation  THN CM Short Term Goal #1 Start Date  02/28/18  THN CM Short Term Goal #1 Met Date   Interventions for Short Term Goal #1  reviewed early signs and symptoms of COPD exacerbation  THN CM Short Term Goal #2   Over the next 30 days patient will exercise caution with regard to outdoor activities (warmer temperatures)  THN CM Short Term Goal #2 Start Date  02/28/18  THN CM Short Term Goal #2 Met Date   Interventions for Short Term Goal #2  reviewed with patient potential dangers of  outdoor activities during warmer temperatures and advised to avoid  THN CM Short Term Goal #3  Over the next 30 days, patient will verbalize understanding of when to call provider for new or worsened symptoms  THN CM Short Term Goal #3 Start Date  02/28/18  THN CM Short Term Goal #3 Met Date   Interventions for Short Tern Goal #3  reviewed with patient when to call provider with new or worsened symptoms    [place] Management  [phone]

## 2018-03-01 ENCOUNTER — Telehealth: Payer: Self-pay

## 2018-03-01 ENCOUNTER — Other Ambulatory Visit: Payer: Self-pay | Admitting: Internal Medicine

## 2018-03-01 MED ORDER — HYDROXYZINE HCL 25 MG PO TABS: 25.0000 mg | ORAL_TABLET | Freq: Two times a day (BID) | ORAL | 5 refills | Status: DC | PRN

## 2018-03-01 NOTE — Telephone Encounter (Signed)
Lacy Duverney fro [place] Nurse called on behalf of Mrs. Eller stating she is needing a change in her Hydroxyzine 50 mg to 25 mg. Patient states she believes 25 mg helps her out with her sx of headaches and she takes 1/2 tablet of 50 mg. Please advise change and send Rx to patient pharmacy. She has a month left on her 50 mg tablets.

## 2018-03-01 NOTE — Addendum Note (Signed)
Addended by: Clerance Lav on: 03/01/2018 11:59 AM   Modules accepted: Orders

## 2018-03-02 ENCOUNTER — Encounter: Payer: Self-pay | Admitting: *Deleted

## 2018-03-08 ENCOUNTER — Ambulatory Visit: Payer: Self-pay

## 2018-03-09 ENCOUNTER — Ambulatory Visit: Payer: Self-pay | Admitting: Radiation Oncology

## 2018-03-10 ENCOUNTER — Ambulatory Visit: Payer: Self-pay | Admitting: *Deleted

## 2018-03-13 DIAGNOSIS — J441 Chronic obstructive pulmonary disease with (acute) exacerbation: Secondary | ICD-10-CM | POA: Diagnosis not present

## 2018-03-29 ENCOUNTER — Ambulatory Visit: Payer: Self-pay

## 2018-03-31 ENCOUNTER — Encounter: Payer: Self-pay | Admitting: *Deleted

## 2018-03-31 ENCOUNTER — Telehealth: Payer: Self-pay

## 2018-03-31 ENCOUNTER — Encounter: Payer: Self-pay | Admitting: Internal Medicine

## 2018-03-31 ENCOUNTER — Other Ambulatory Visit: Payer: Self-pay | Admitting: *Deleted

## 2018-03-31 NOTE — Telephone Encounter (Signed)
Needs a documentation for distance for walking to mailbox from walking across street to near her home. She is on O2 daily and not able to reach her mailbox. She need the letter sent to [facility] postal service to notify them to have it near her house.

## 2018-03-31 NOTE — Patient Outreach (Addendum)
[facility] [facility]) Care Management  03/31/2018  Carol Gay Jun 30, 1946 [id number]  RN Health Coach attempted follow up outreach call to patient.  Patient was unavailable. HIPPA compliance voicemail message left with return callback number.  Plan: RN will call patient again within 10 business days  [place] Management [phone]  [place] [facility]) Care Management  04/03/2018 Late entry  Carol Gay 21-Nov-1945 [id number]  RN Health Coach received return telephone call from patient.  [place] compliance verified. Per patient she is using her oxygen continuously at 2.5 liters. Patient is using a rollator walker to get around. Patient is having to stay in the house mostly due to the heat and humidity. Patient is in the yellow zone. She has not had to use her rescue inhalers much, but had to use more today. Per patient she has a dog that helps to keep her going and is company for her. Patient stated she uses her nebulizer twice a day rather than three times because she starts  feeling anxious and jittery. Patient does not have a medical alert system. Per patient her son is there most of the time. Her family has mentioned about getting one. Patient has agreed to further outreach calls.     Current Medications:  Current Outpatient Medications  Medication Sig Dispense Refill  . acetaminophen (TYLENOL) 500 MG tablet Take 1,000 mg by mouth every 6 (six) hours as needed.    Marland Kitchen albuterol (PROVENTIL HFA;VENTOLIN HFA) 108 (90 Base) MCG/ACT inhaler INHALE 2 PUFFS INTO THE LUNGS EVERY 6 HOURS AS NEEDED FOR WHEEZING OR SHORTNESS OF BREATH 54 g 4  . amLODipine (NORVASC) 5 MG tablet TAKE 1 TABLET EVERY DAY 90 tablet 3  . atorvastatin (LIPITOR) 20 MG tablet TAKE 1 TABLET AT BEDTIME 90 tablet 3  . Fluticasone-Umeclidin-Vilant (TRELEGY ELLIPTA) 100-62.5-25 MCG/INH AEPB Inhale 1 puff into the lungs daily. 180 each 3  . hydrOXYzine  (ATARAX/VISTARIL) 25 MG tablet Take 1 tablet (25 mg total) by mouth 2 (two) times daily as needed. 60 tablet 5  . ipratropium-albuterol (DUONEB) 0.5-2.5 (3) MG/3ML SOLN Take 3 mLs by nebulization 3 (three) times daily. 360 mL 1  . loperamide (IMODIUM A-D) 2 MG tablet Take 2 mg by mouth 4 (four) times daily as needed for diarrhea or loose stools.    . Multiple Vitamins-Minerals (CENTRUM SILVER PO) Take 1 tablet by mouth daily.    . OXYGEN Inhale 2.5 L/min into the lungs continuous.      No current facility-administered medications for this visit.    Facility-Administered Medications Ordered in Other Visits  Medication Dose Route Frequency Provider Last Rate Last Dose  . albuterol (PROVENTIL) (2.5 MG/3ML) 0.083% nebulizer solution 2.5 mg  2.5 mg Nebulization Once Laverle Hobby, MD        Functional Status:  In your present state of health, do you have any difficulty performing the following activities: 03/31/2018 12/28/2017  Hearing? N N  Vision? N N  Difficulty concentrating or making decisions? N N  Walking or climbing stairs? Y Y  Comment - secondary to shortness of breath  Dressing or bathing? N N  Doing errands, shopping? Y Y  Comment - secondary to shortness of breath  Preparing Food and eating ? N N  Using the Toilet? N N  In the past six months, have you accidently leaked urine? N N  Comment - -  Do you have problems with loss of bowel control? N N  Managing  your Medications? N N  Managing your Finances? N N  Housekeeping or managing your Housekeeping? Y Y  Comment - more difficult with worsening shortness of breath  Some recent data might be hidden    Fall/Depression Screening: Fall Risk  03/31/2018 12/28/2017 05/16/2017  Falls in the past year? No No No  Risk for fall due to : Impaired balance/gait;Impaired mobility Impaired mobility -  Risk for fall due to: Comment - secondary to fatigue -   PHQ 2/9 Scores 03/31/2018 12/28/2017 05/16/2017 02/24/2017 02/20/2016 11/17/2015  09/02/2015  PHQ - 2 Score 1 1 0 0 0 0 6  PHQ- 9 Score - - - - - - 10   THN CM Care Plan Problem One     Most Recent Value  Care Plan Problem One  Knowledge Deficit in Self Management of COPD  Role Documenting the Problem One  [place] for Problem One  Active  THN Long Term Goal   Patient will not have any readmissions for COPD exacerbations within the next 90 days  THN Long Term Goal Start Date  03/31/18  Interventions for Problem One Long Term Goal  RN discussed COPD exacerbation. RN discussed zones and action plan. RN discussed medication adherence. Rn will follow up with further discussion   THN CM Short Term Goal #1   Patient will have a better understanding of purse-lip breathing within the next 30 days  THN CM Short Term Goal #1 Start Date  03/31/18  Interventions for Short Term Goal #1  RN discussed  purse lip breathing and the benefits.  RN sent educational material on purselip breathing. RN will follow up with further discussion and teach back.   THN CM Short Term Goal #2   Patient will have a better understanding of COPD exacerbation within the next 30 days  THN CM Short Term Goal #2 Start Date  03/31/18  Interventions for Short Term Goal #2  RN discussed COPD exacerbation. RN sent EMMI eduational information on COPD exacerbation. RN will follow up with further discussion and teach back  THN CM Short Term Goal #3  Patient will verbalize receiving information to reiterate and  practice oxygen safety within the next 30 days  THN CM Short Term Goal #3 Start Date  03/31/18  Interventions for Short Tern Goal #3  RN sent patient educational information for reiterating the safety guidelines in oxygen safety      Assessment:  Patient is on continuous O2 therapy Patient does not have a medical alert Patient will benefit from [place] telephonic outreach for education and support for COPD self management.  Plan:  RN sent 2019 Calendar Book RN sent educational  material on COPD exacerbation RN sent safety educational material on Oxygen therapy RN sent information about the medical alert system RN sent barriers letter and assessment to [place] Management [phone]

## 2018-03-31 NOTE — Telephone Encounter (Signed)
Please let patient know that letter is written.  She may need to pick it up and take it to the post master herself.

## 2018-04-03 ENCOUNTER — Encounter: Payer: Self-pay | Admitting: *Deleted

## 2018-04-03 NOTE — Telephone Encounter (Signed)
Patient informed. Would like letter mailed to her and she will get the letter to them that way. Mailed letter for patient and confirmed her address before sending.

## 2018-04-05 ENCOUNTER — Other Ambulatory Visit: Payer: Self-pay | Admitting: Internal Medicine

## 2018-04-05 DIAGNOSIS — J439 Emphysema, unspecified: Secondary | ICD-10-CM

## 2018-04-05 NOTE — Telephone Encounter (Signed)
This encounter was created in error - please disregard.

## 2018-04-12 DIAGNOSIS — J441 Chronic obstructive pulmonary disease with (acute) exacerbation: Secondary | ICD-10-CM | POA: Diagnosis not present

## 2018-04-27 ENCOUNTER — Ambulatory Visit: Payer: Self-pay

## 2018-05-08 ENCOUNTER — Other Ambulatory Visit: Payer: Self-pay | Admitting: *Deleted

## 2018-05-13 DIAGNOSIS — J441 Chronic obstructive pulmonary disease with (acute) exacerbation: Secondary | ICD-10-CM | POA: Diagnosis not present

## 2018-05-17 ENCOUNTER — Ambulatory Visit: Payer: Self-pay

## 2018-05-22 NOTE — Patient Outreach (Signed)
[facility]) Care Management  05/08/2018 Carol Gay [birth year] [id number]   RN Health Coach attempted #1 follow up outreach call to patient.  Patient was unavailable. HIPPA compliance voicemail message left with return callback number.  Plan: RN will call patient again .  [place] Care Management [phone]

## 2018-05-29 ENCOUNTER — Encounter: Payer: Self-pay | Admitting: Internal Medicine

## 2018-05-29 ENCOUNTER — Ambulatory Visit (INDEPENDENT_AMBULATORY_CARE_PROVIDER_SITE_OTHER): Payer: Medicare HMO | Admitting: Internal Medicine

## 2018-05-29 VITALS — BP 134/78 | HR 78 | Ht 62.0 in | Wt 173.0 lb

## 2018-05-29 DIAGNOSIS — J438 Other emphysema: Secondary | ICD-10-CM | POA: Diagnosis not present

## 2018-05-29 DIAGNOSIS — Z85048 Personal history of other malignant neoplasm of rectum, rectosigmoid junction, and anus: Secondary | ICD-10-CM

## 2018-05-29 DIAGNOSIS — R197 Diarrhea, unspecified: Secondary | ICD-10-CM | POA: Diagnosis not present

## 2018-05-29 NOTE — Progress Notes (Signed)
Date:  05/29/2018   Name:  Carol Gay   DOB:  [birth year]   MRN:  [id number]   Chief Complaint: Abdominal Pain (Sore spot on lower stomach. Taking Imodium for diarrhea alot more than usual. )  Diarrhea   This is a chronic problem. The problem has been gradually worsening. The stool consistency is described as mucous and watery. The patient states that diarrhea does not awaken her from sleep. Associated symptoms include abdominal pain. Pertinent negatives include no chills, coughing, fever, headaches, vomiting or weight loss.   COPD - still very SOB, wearing oxygen around the clock.  Weight is stable.  No fever, sweats. Using Treligy daily and rescue inhaler.  She also has a nebulizer to use if needed - around once a day.   Review of Systems  Constitutional: Negative for chills, fatigue, fever and weight loss.  Eyes: Negative for visual disturbance.  Respiratory: Positive for shortness of breath and wheezing. Negative for cough, choking and chest tightness.   Cardiovascular: Negative for chest pain, palpitations and leg swelling.  Gastrointestinal: Positive for abdominal pain and diarrhea. Negative for blood in stool and vomiting.  Allergic/Immunologic: Negative for environmental allergies.  Neurological: Negative for dizziness, light-headedness and headaches.  Psychiatric/Behavioral: Negative for sleep disturbance.    Patient Active Problem List   Diagnosis Date Noted  . Dependence on continuous supplemental oxygen 12/28/2017  . Vitamin D deficiency 08/23/2017  . Age-related osteoporosis without current pathological fracture 08/22/2017  . Cyst of subcutaneous tissue 05/19/2017  . HPV (human papilloma virus) anogenital infection 07/07/2016  . Edema extremities 05/05/2016  . History of anal cancer 11/26/2015  . Blood in stool   . Benign neoplasm of transverse colon   . Benign neoplasm of rectosigmoid junction   . Fistula of intestine, excluding rectum and anus   . Pulmonary  emphysema ([place]) 10/24/2015  . Encounter for long-term (current) use of medications 02/28/2015  . Aortic atherosclerosis ([place]) 12/30/2014  . CAD in native artery 12/30/2014  . DDD (degenerative disc disease), lumbar 12/30/2014  . Dyslipidemia 12/30/2014  . Essential (primary) hypertension 12/30/2014  . Dermatophytoses 12/30/2014  . Compulsive tobacco user syndrome 12/30/2014    Allergies  Allergen Reactions  . Aspirin Nausea And Vomiting  . Codeine Nausea And Vomiting  . Morphine And Related Nausea And Vomiting    Past Surgical History:  Procedure Laterality Date  . APPENDECTOMY    . BREAST BIOPSY Left 1970's   neg  . COLONOSCOPY WITH PROPOFOL N/A 11/06/2015   Procedure: COLONOSCOPY WITH PROPOFOL;  Surgeon: Lucilla Lame, MD;  Location: [facility];  Service: Endoscopy;  Laterality: N/A;  LEAVE PT EARLY  . EXPLORATORY LAPAROTOMY    . LESION EXCISION  11/03/15   Anal - Dr Dahlia Byes[facility]  . LESION REMOVAL  1970's   from tailbone  . [place] SURGERY  1997  . OVARIAN CYST REMOVAL Left   . POLYPECTOMY  11/06/2015   Procedure: POLYPECTOMY;  Surgeon: Lucilla Lame, MD;  Location: [facility];  Service: Endoscopy;;  . PORTACATH PLACEMENT N/A 12/10/2015   Procedure: INSERTION PORT-A-CATH;  Surgeon: Jules Husbands, MD;  Location: [facility];  Service: General;  Laterality: N/A;  . RECTAL EXAM UNDER ANESTHESIA N/A 06/23/2016   Procedure: RECTAL EXAM UNDER ANESTHESIA;  Surgeon: Jules Husbands, MD;  Location: [facility];  Service: General;  Laterality: N/A;  . VEIN LIGATION AND STRIPPING      Social History   Tobacco Use  . Smoking  status: Former Smoker    Packs/day: 1.00    Years: 57.00    Pack years: 57.00    Types: Cigarettes    Last attempt to quit: 03/30/2016    Years since quitting: 2.1  . Smokeless tobacco: Never Used  . Tobacco comment: Quit in July 2017  Substance Use Topics  . Alcohol use: No    Alcohol/week: 0.0 standard drinks  . Drug use: No      Medication list has been reviewed and updated.  Current Meds  Medication Sig  . acetaminophen (TYLENOL) 500 MG tablet Take 1,000 mg by mouth every 6 (six) hours as needed.  Marland Kitchen albuterol (PROVENTIL HFA;VENTOLIN HFA) 108 (90 Base) MCG/ACT inhaler INHALE 2 PUFFS INTO THE LUNGS EVERY 6 HOURS AS NEEDED FOR WHEEZING OR SHORTNESS OF BREATH  . amLODipine (NORVASC) 5 MG tablet TAKE 1 TABLET EVERY DAY  . atorvastatin (LIPITOR) 20 MG tablet TAKE 1 TABLET AT BEDTIME  . hydrOXYzine (ATARAX/VISTARIL) 25 MG tablet Take 1 tablet (25 mg total) by mouth 2 (two) times daily as needed.  Marland Kitchen ipratropium-albuterol (DUONEB) 0.5-2.5 (3) MG/3ML SOLN Take 3 mLs by nebulization 3 (three) times daily.  Marland Kitchen loperamide (IMODIUM A-D) 2 MG tablet Take 2 mg by mouth 4 (four) times daily as needed for diarrhea or loose stools.  . Multiple Vitamins-Minerals (CENTRUM SILVER PO) Take 1 tablet by mouth daily.  . OXYGEN Inhale 2.5 L/min into the lungs continuous.   . TRELEGY ELLIPTA 100-62.5-25 MCG/INH AEPB INHALE 1 PUFF INTO THE LUNGS DAILY.    PHQ 2/9 Scores 03/31/2018 12/28/2017 05/16/2017 02/24/2017  PHQ - 2 Score 1 1 0 0  PHQ- 9 Score - - - -    Physical Exam  Constitutional: She is oriented to person, place, and time. She appears well-developed. No distress.  HENT:  Head: Normocephalic and atraumatic.  Cardiovascular: Normal rate, regular rhythm and normal heart sounds.  Pulmonary/Chest: Effort normal. No respiratory distress. She has decreased breath sounds. She has no rhonchi.  Abdominal: Soft. Normal appearance. There is tenderness in the left lower quadrant. There is no rigidity and no guarding.  Musculoskeletal: Normal range of motion.  Neurological: She is alert and oriented to person, place, and time.  Skin: Skin is warm and dry. No rash noted.  Psychiatric: She has a normal mood and affect. Her behavior is normal. Thought content normal.  Nursing note and vitals reviewed.  Wt Readings from Last 3 Encounters:   05/29/18 173 lb (78.5 kg)  12/30/17 176 lb (79.8 kg)  12/28/17 175 lb (79.4 kg)    BP 134/78 (BP Location: Right Arm, Patient Position: Sitting, Cuff Size: Normal)   Pulse 78   Ht 5\' 2"  (1.575 m)   Wt 173 lb (78.5 kg)   SpO2 (!) 84%   BMI 31.64 kg/m   Assessment and Plan: 1. Other emphysema ([place]) Continue O2 Treligy Use nebulizer rather than MDI when at home  2. History of anal cancer Needs follow up anoscopy CT scheduled for October - Ambulatory referral to Gastroenterology  3. Diarrhea, unspecified type Increase imodium to 4 per day if needed - CBC with Differential/Platelet - Comprehensive metabolic panel - TSH - Ambulatory referral to Gastroenterology   No orders of the defined types were placed in this encounter.   Partially dictated using Editor, commissioning. Any errors are unintentional.  Halina Maidens, MD [facility]  05/29/2018

## 2018-05-30 LAB — COMPREHENSIVE METABOLIC PANEL
A/G RATIO: 1.5 (ref 1.2–2.2)
ALK PHOS: 84 IU/L (ref 39–117)
ALT: 13 IU/L (ref 0–32)
AST: 18 IU/L (ref 0–40)
Albumin: 4.6 g/dL (ref 3.5–4.8)
BILIRUBIN TOTAL: 0.3 mg/dL (ref 0.0–1.2)
BUN/Creatinine Ratio: 21 (ref 12–28)
BUN: 23 mg/dL (ref 8–27)
CHLORIDE: 102 mmol/L (ref 96–106)
CO2: 21 mmol/L (ref 20–29)
Calcium: 10.4 mg/dL — ABNORMAL HIGH (ref 8.7–10.3)
Creatinine, Ser: 1.12 mg/dL — ABNORMAL HIGH (ref 0.57–1.00)
GFR calc Af Amer: 57 mL/min/{1.73_m2} — ABNORMAL LOW (ref >59)
GFR calc non Af Amer: 49 mL/min/{1.73_m2} — ABNORMAL LOW (ref >59)
GLUCOSE: 83 mg/dL (ref 65–99)
Globulin, Total: 3.1 g/dL (ref 1.5–4.5)
POTASSIUM: 4.4 mmol/L (ref 3.5–5.2)
Sodium: 141 mmol/L (ref 134–144)
Total Protein: 7.7 g/dL (ref 6.0–8.5)

## 2018-05-30 LAB — CBC WITH DIFFERENTIAL/PLATELET
BASOS ABS: 0.1 10*3/uL (ref 0.0–0.2)
BASOS: 1 %
EOS (ABSOLUTE): 0.4 10*3/uL (ref 0.0–0.4)
Eos: 5 %
Hematocrit: 43.3 % (ref 34.0–46.6)
Hemoglobin: 15 g/dL (ref 11.1–15.9)
Immature Grans (Abs): 0 10*3/uL (ref 0.0–0.1)
Immature Granulocytes: 0 %
LYMPHS ABS: 1.9 10*3/uL (ref 0.7–3.1)
Lymphs: 24 %
MCH: 31.1 pg (ref 26.6–33.0)
MCHC: 34.6 g/dL (ref 31.5–35.7)
MCV: 90 fL (ref 79–97)
Monocytes Absolute: 0.6 10*3/uL (ref 0.1–0.9)
Monocytes: 7 %
NEUTROS ABS: 4.9 10*3/uL (ref 1.4–7.0)
Neutrophils: 63 %
PLATELETS: 258 10*3/uL (ref 150–450)
RBC: 4.82 x10E6/uL (ref 3.77–5.28)
RDW: 13.7 % (ref 12.3–15.4)
WBC: 7.9 10*3/uL (ref 3.4–10.8)

## 2018-05-30 LAB — TSH: TSH: 6.21 u[IU]/mL — AB (ref 0.450–4.500)

## 2018-05-31 ENCOUNTER — Ambulatory Visit: Payer: Self-pay

## 2018-06-01 ENCOUNTER — Other Ambulatory Visit: Payer: Self-pay | Admitting: *Deleted

## 2018-06-01 NOTE — Patient Outreach (Signed)
[facility]) Care Management  06/01/2018  PEGGE CUMBERLEDGE 09/06/1946 [id number]   RN Health Coach attempted #2 follow up outreach call to patient.  Patient was unavailable. HIPPA compliance voicemail message left with return callback number.  Plan: RN will send patient unsuccessful outreach letter  If no response closure within 10 business days.  [facility] Management [phone]

## 2018-06-07 ENCOUNTER — Inpatient Hospital Stay: Payer: Medicare HMO | Attending: Obstetrics and Gynecology | Admitting: Obstetrics and Gynecology

## 2018-06-07 VITALS — BP 166/82 | HR 86 | Temp 97.3°F | Resp 22 | Ht 62.0 in | Wt 176.1 lb

## 2018-06-07 DIAGNOSIS — Z923 Personal history of irradiation: Secondary | ICD-10-CM | POA: Diagnosis not present

## 2018-06-07 DIAGNOSIS — N895 Stricture and atresia of vagina: Secondary | ICD-10-CM | POA: Diagnosis not present

## 2018-06-07 DIAGNOSIS — Z87891 Personal history of nicotine dependence: Secondary | ICD-10-CM | POA: Diagnosis not present

## 2018-06-07 DIAGNOSIS — Z9221 Personal history of antineoplastic chemotherapy: Secondary | ICD-10-CM | POA: Diagnosis not present

## 2018-06-07 DIAGNOSIS — Z08 Encounter for follow-up examination after completed treatment for malignant neoplasm: Secondary | ICD-10-CM | POA: Insufficient documentation

## 2018-06-07 DIAGNOSIS — A630 Anogenital (venereal) warts: Secondary | ICD-10-CM | POA: Diagnosis not present

## 2018-06-07 DIAGNOSIS — Z85048 Personal history of other malignant neoplasm of rectum, rectosigmoid junction, and anus: Secondary | ICD-10-CM | POA: Diagnosis not present

## 2018-06-07 NOTE — Progress Notes (Signed)
Gynecologic Oncology Interval Visit   Referring Provider: Dr. Baruch Gouty  Chief Concern: Anal cancer. HR HPV+ PAP  Subjective:  Carol Gay is a 72 y.o. 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 Grayland Ormond (medical-oncology) and Dr. Baruch Gouty (radiation-oncology) to rule out rectovaginal fistula. She returns to clinic today for follow-up and re-evaluation of vaginal stenosis. Pap 10/18 normal, HPV +.   She was last seen by Dr. Fransisca Connors 09/07/17 and no evidence of disease at that time. She had previously suffered lower vaginal stricture d/t radiation that was manually broken down. She was provided with dilators but did not use.   She has history of atypical inflammatory changes in lungs and is former heavy smoker. She continues to use supplemental oxygen. She complains of fatigue, weakness and shortness of breath. She has diarrhea and nausea. No gyn complaints.   Cancer History Patient is a 72 year old female with a several month history of intermittent anorectal pain and slightbleeding which she initially attributed to hemorrhoids. She was found by Dr. Perrin Maltese in general surgery to have a mass in the anal canal and was set up for biopsy as well as lower endoscopy. At the time of endoscopy a frond-like villous nonobstructing mass was found in distal rectum. There is also noted to be a "fistula". Colonoscopy:  - One 4 mm polyp in the transverse colon, removed with a cold snare. Resected and retrieved. - Two 4 to 5 mm polyps at the recto-sigmoid colon, removed with a cold snare. Resected and retrieved. - Tumor in the rectum. Biopsied. - Colonic fistula. - Diverticulosis in the sigmoid colon  Biopsy was positive for squamous cell carcinoma. CT scan demonstrated a soft tissue fullness of the anal rectal junction again suspicious for newly diagnosed rectal cancer. No evidence of metastatic disease inguinal  adenopathy or liver metastasis was noted. PET CT scan 3/3 showed the hypermetabolic anal mass, but no evidence of metastatic disease.    Underwent chemo/RT treatment with Drs Grayland Ormond and Chrystal at [facility] with complete response.    Initially seen in 3/17 to rule out rectovaginal fistula in context of newly diagnosed anal squamous cell cancer.  No fistula was noted and vagina and cervix were normal.   3/17 HR HPV+ (PAP normal).  Colposcopy was done and was negative. Os stenotic and no ECC could be done.   Mid vaginal stricture manually broken down and gave her a small dilator.  She used it for a week and then stopped because it hurt.  No complaints today.  PAP 10/18 normal, HPV +.  Smoked one PPD cigarettes, and stopped in July 2017.  Problem List: Patient Active Problem List   Diagnosis Date Noted  . Dependence on continuous supplemental oxygen 12/28/2017  . Vitamin D deficiency 08/23/2017  . Age-related osteoporosis without current pathological fracture 08/22/2017  . Cyst of subcutaneous tissue 05/19/2017  . HPV (human papilloma virus) anogenital infection 07/07/2016  . Edema extremities 05/05/2016  . History of anal cancer 11/26/2015  . Blood in stool   . Benign neoplasm of transverse colon   . Benign neoplasm of rectosigmoid junction   . Fistula of intestine, excluding rectum and anus   . Pulmonary emphysema ([place]) 10/24/2015  . Encounter for long-term (current) use of medications 02/28/2015  . Aortic atherosclerosis ([place]) 12/30/2014  . CAD in native artery 12/30/2014  . DDD (degenerative disc disease), lumbar 12/30/2014  . Dyslipidemia 12/30/2014  . Essential (primary) hypertension 12/30/2014  .  Dermatophytoses 12/30/2014  . Compulsive tobacco user syndrome 12/30/2014    Past Medical History: Past Medical History:  Diagnosis Date  . Aortic atherosclerosis ([place])   . Arm pain   . Asthma   . CAD in native artery   . Cancer ([place]) 09/2015   rectum  . Cigarette smoker   .  Claustrophobia   . Colon cancer ([place])   . 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 COPD (chronic obstructive pulmonary disease) ([place])   . DDD (degenerative disc disease), lumbar       . Dermatophytoses   . Dyslipidemia   . H/O blood clots    left leg, veins stripped  . Hemorrhoids   . Hypertension   . Last menstrual period (LMP) > 10 days ago 1994  . Myocardial infarction [facility])    "mild" - age 64  . Personal history of chemotherapy   . Personal history of radiation therapy   . Pulmonary emphysema ([place])   . Rectal bleeding   . Shortness of breath dyspnea   . Smokers' cough ([place])   . Spinal headache    with one of my children's birth  . Vertigo   . Wears dentures    has full upper and lower - doesn't wear    Past Surgical History: Past Surgical History:  Procedure Laterality Date  . APPENDECTOMY    . BREAST BIOPSY Left 1970's   neg  . COLONOSCOPY WITH PROPOFOL N/A 11/06/2015   Procedure: COLONOSCOPY WITH PROPOFOL;  Surgeon: Lucilla Lame, MD;  Location: [facility];  Service: Endoscopy;  Laterality: N/A;  LEAVE PT EARLY  . EXPLORATORY LAPAROTOMY    . LESION EXCISION  11/03/15   Anal - Dr Dahlia Byes[facility]  . LESION REMOVAL  1970's   from tailbone  . [place] SURGERY  1997  . OVARIAN CYST REMOVAL Left   . POLYPECTOMY  11/06/2015   Procedure: POLYPECTOMY;  Surgeon: Lucilla Lame, MD;  Location: [facility];  Service: Endoscopy;;  . PORTACATH PLACEMENT N/A 12/10/2015   Procedure: INSERTION PORT-A-CATH;  Surgeon: Jules Husbands, MD;  Location: [facility];  Service: General;  Laterality: N/A;  . RECTAL EXAM UNDER ANESTHESIA N/A 06/23/2016   Procedure: RECTAL EXAM UNDER ANESTHESIA;  Surgeon: Jules Husbands, MD;  Location: [facility];  Service: General;  Laterality: N/A;  . VEIN LIGATION AND STRIPPING      OB History:  NSVD x 3  Family History: Family History  Problem Relation Age of Onset  . Cancer Mother        lymphoma  . Heart disease  Mother   . Cancer Maternal Grandmother        breast  . Birth defects Maternal Grandmother   . Breast cancer Maternal Grandmother     Social History: Social History   Socioeconomic History  . Marital status: Widowed    Spouse name: Not on file  . Number of children: Not on file  . Years of education: Not on file  . Highest education level: Not on file  Occupational History  . Not on file  Social Needs  . Financial resource strain: Not on file  . Food insecurity:    Worry: Not on file    Inability: Not on file  . Transportation needs:    Medical: Not on file    Non-medical: Not on file  Tobacco Use  . Smoking status: Former Smoker    Packs/day: 1.00  Years: 57.00    Pack years: 57.00    Types: Cigarettes    Last attempt to quit: 03/30/2016    Years since quitting: 2.1  . Smokeless tobacco: Never Used  . Tobacco comment: Quit in July 2017  Substance and Sexual Activity  . Alcohol use: No    Alcohol/week: 0.0 standard drinks  . Drug use: No  . Sexual activity: Not on file  Lifestyle  . Physical activity:    Days per week: Not on file    Minutes per session: Not on file  . Stress: Not on file  Relationships  . Social connections:    Talks on phone: Not on file    Gets together: Not on file    Attends religious service: Not on file    Active member of club or organization: Not on file    Attends meetings of clubs or organizations: Not on file    Relationship status: Not on file  . Intimate partner violence:    Fear of current or ex partner: Not on file    Emotionally abused: Not on file    Physically abused: Not on file    Forced sexual activity: Not on file  Other Topics Concern  . Not on file  Social History Narrative  . Not on file    Allergies: Allergies  Allergen Reactions  . Aspirin Nausea And Vomiting  . Codeine Nausea And Vomiting  . Morphine And Related Nausea And Vomiting    Current Medications: Current Outpatient Medications   Medication Sig Dispense Refill  . acetaminophen (TYLENOL) 500 MG tablet Take 1,000 mg by mouth every 6 (six) hours as needed.    Marland Kitchen albuterol (PROVENTIL HFA;VENTOLIN HFA) 108 (90 Base) MCG/ACT inhaler INHALE 2 PUFFS INTO THE LUNGS EVERY 6 HOURS AS NEEDED FOR WHEEZING OR SHORTNESS OF BREATH 54 g 4  . amLODipine (NORVASC) 5 MG tablet TAKE 1 TABLET EVERY DAY 90 tablet 3  . atorvastatin (LIPITOR) 20 MG tablet TAKE 1 TABLET AT BEDTIME 90 tablet 3  . hydrOXYzine (ATARAX/VISTARIL) 25 MG tablet Take 1 tablet (25 mg total) by mouth 2 (two) times daily as needed. 60 tablet 5  . ipratropium-albuterol (DUONEB) 0.5-2.5 (3) MG/3ML SOLN Take 3 mLs by nebulization 3 (three) times daily. 360 mL 1  . loperamide (IMODIUM A-D) 2 MG tablet Take 2 mg by mouth 4 (four) times daily as needed for diarrhea or loose stools.    . Multiple Vitamins-Minerals (CENTRUM SILVER PO) Take 1 tablet by mouth daily.    . OXYGEN Inhale 2.5 L/min into the lungs continuous.     . TRELEGY ELLIPTA 100-62.5-25 MCG/INH AEPB INHALE 1 PUFF INTO THE LUNGS DAILY. 180 each 3   No current facility-administered medications for this visit.    Facility-Administered Medications Ordered in Other Visits  Medication Dose Route Frequency Provider Last Rate Last Dose  . albuterol (PROVENTIL) (2.5 MG/3ML) 0.083% nebulizer solution 2.5 mg  2.5 mg Nebulization Once Laverle Hobby, MD       Review of Systems General:  Fatigue, weakness Skin: no complaints Eyes: no complaints HEENT: no complaints Breasts: no complaints Pulmonary: shortness of breath Cardiac: no complaints Gastrointestinal: nausea, diarrhea Genitourinary/Sexual: no complaints Ob/Gyn: no complaints Musculoskeletal: no complaints Hematology: no complaints Neurologic/Psych: no complaints  Objective:  Physical Examination:  BP (!) 166/82   Pulse 86   Temp (!) 97.3 F (36.3 C) (Tympanic)   Resp (!) 22   Ht 5\' 2"  (1.575 m)   Wt 176 lb 1.6  oz (79.9 kg)   BMI 32.21 kg/m     ECOG Performance Status: 2 - Symptomatic, <50% confined to bed  General appearance: alert, cooperative and appears stated age HEENT:PERRLA, neck supple with midline trachea and thyroid without masses Lymph node survey: non-palpable, axillary, inguinal, supraclavicular Cardiovascular: regular rate and rhythm Respiratory: lungs clear to auscultation Abdomen: soft, non-tender, without masses or organomegaly, no hernias and well healed incision Back: inspection of back is normal Extremities: extremities normal, atraumatic, no cyanosis or edema Skin exam - normal coloration and turgor, no rashes, no suspicious skin lesions noted. Neurological exam reveals alert, oriented, normal speech, no focal findings or movement disorder noted.  Pelvic: exam chaperoned by nurse,  EGBUS: atrophic. No lesions.  Vagina: recurrence of lowe vaginal stricture and this was broken down again.  Caliber of vagina admits one finger.   Normal cervix without lesions, polyps or tenderness, but is flush with the vault and difficult to visualize.  Adnexa: normal adnexa in size, nontender and no masses; Uterus: uterus is normal size, shape, consistency and nontender; Rectal: normal     Assessment:  Anal squamous cell cancer diagnosed 3/17 s/p chemoradiation with complete response. NED today.   HR HPV+ 3/17 (normal PAP) with no bleeding or discharge and no cervical or vaginal lesion seen on colposcopy.  ECC not done because cervix atrophic and os stenotic.  Follow up HPV 10/18 still positive (PAP normal).   Exam today normal except for recurrence of lower vaginal stricture due to radiation and this was broken down manually again today.  She has not use the dilator.  Medical co-morbidities complicating care:  Oxygen requirement  Plan:   Problem List Items Addressed This Visit      Other   History of anal cancer - Primary    Other Visit Diagnoses    Vaginal stenosis         She will use the dilator once or twice a  week for 10 minutes.  Diarrhea persists and will refer to symptom management clinic.  She will RTC in 6 months for exam and will repeat PAP and HPV at that time.   She will follow up with Drs. Chrystal and Finnegan for anal cancer surveillance, and rectal exam normal today.    Beckey Rutter, DNP, AGNP-C [facility] (work cell) [phone] (office)  CC:  Glean Hess, MD [address] [place], [place] [address] [phone]   I personally interviewed and examined the patient including history, physical exam, diagnosis and plan of care. Agreed with the above/below plan of care. Patient/family questions were answered.  Mellody Drown, MD

## 2018-06-07 NOTE — Progress Notes (Signed)
Occ. Spotting sometimes it is very little, no pain in GYN until the exam is done. Stomach pain in left and right upper quadrant

## 2018-06-07 NOTE — Patient Instructions (Signed)
Ms. Jayleena, Stille can call the Symptom Management Clinic for an appointment to evaluate your diarrhea. Phone number is: [phone] (press option 3 and leave a message).   Please use cervical dilator once or twice a week to prevent vaginal stenosis.   It was a pleasure seeing you today and thank you for allowing me to participate in your care.   Beckey Rutter, NP

## 2018-06-13 DIAGNOSIS — J441 Chronic obstructive pulmonary disease with (acute) exacerbation: Secondary | ICD-10-CM | POA: Diagnosis not present

## 2018-06-15 ENCOUNTER — Ambulatory Visit: Payer: Self-pay | Admitting: *Deleted

## 2018-06-19 ENCOUNTER — Ambulatory Visit: Payer: Self-pay | Admitting: *Deleted

## 2018-06-19 NOTE — Chronic Care Management (AMB) (Signed)
[facility]) Care Management  06/19/2018  GENEIVE SANDSTROM Jun 03, 1946 [id number]  Call received from Mrs. Budzinski who asked if I would help her get in touch with Dr. Army Melia to notify her that "slowly" Mrs. Maiolo's breathing is "getting worse". She reports that she is now using oxygen continuously at 4l/[place] (up from 2.5L). She denies symptoms or complaints of acute shortness of breath, chest pain, lightheadedness, loss of consciousness, or fever. Mrs. Arel does indicate that she has mild swelling of her left ankle and that she has been drinking "at least 4" glasses of water daily.   I have not worked with Mrs. Blixt in several months.She is enrolled in [facility] CM services and is active with our telephonic case management team.   Plan: I have called Dr. Gaspar Cola office leaving a message for her [place] and will forward this note to the [place] Management telephonic case management team.    Janalyn Shy [facility] Care [facility]  [phone]

## 2018-06-20 ENCOUNTER — Other Ambulatory Visit: Payer: Self-pay | Admitting: Internal Medicine

## 2018-06-22 ENCOUNTER — Ambulatory Visit: Payer: Self-pay | Admitting: Gastroenterology

## 2018-06-23 ENCOUNTER — Telehealth: Payer: Self-pay

## 2018-06-23 NOTE — Telephone Encounter (Signed)
Received voicemail from Ms. Bala to return call. Call returned. Left voicemail to call. Oncology Nurse Navigator Documentation  Navigator Location: [facility] Onc (06/23/18 1000)   )Navigator Encounter Type: Telephone (06/23/18 1000) Telephone: [place] Call (06/23/18 1000)                                                  Time Spent with Patient: 15 (06/23/18 1000)

## 2018-06-28 ENCOUNTER — Encounter: Payer: Self-pay | Admitting: *Deleted

## 2018-07-06 ENCOUNTER — Ambulatory Visit
Admission: RE | Admit: 2018-07-06 | Discharge: 2018-07-06 | Disposition: A | Discharge status: Home / Self Care | Payer: Medicare HMO | Source: Ambulatory Visit | Attending: Oncology | Admitting: Oncology

## 2018-07-06 DIAGNOSIS — I700 Atherosclerosis of aorta: Secondary | ICD-10-CM | POA: Insufficient documentation

## 2018-07-06 DIAGNOSIS — C210 Malignant neoplasm of anus, unspecified: Secondary | ICD-10-CM | POA: Diagnosis not present

## 2018-07-06 DIAGNOSIS — J439 Emphysema, unspecified: Secondary | ICD-10-CM | POA: Diagnosis not present

## 2018-07-06 MED ORDER — IOPAMIDOL (ISOVUE-300) INJECTION 61%
100.0000 mL | Freq: Once | INTRAVENOUS | Status: AC | PRN
  Administered 2018-07-06: 100 mL via INTRAVENOUS

## 2018-07-09 NOTE — Progress Notes (Signed)
Carol [facility]  [phone]  Fax:([phone]) [place] DOB: 1946-06-12  MR#: [id number]  UQJ#:[id number]  Patient Care Team: Glean Hess, MD as PCP - General (Internal Medicine) Clent Jacks, RN as Registered Nurse Lucilla Lame, MD as Consulting Physician (Gastroenterology) Lloyd Huger, MD as Consulting Physician (Oncology) Noreene Filbert, MD as Referring Physician (Radiation Oncology) Pleasant, Eppie Gibson, RN as [place] Management  CHIEF COMPLAINT:  Squamous cell carcinoma of the anus, stage II, T2 N0 M0.  INTERVAL HISTORY: Patient returns to clinic today for routine six-month evaluation.  She continues to have chronic shortness of breath requiring oxygen, but otherwise feels well.  She has no neurologic complaints. She denies any recent fevers or illnesses.  She denies any chest pain.  She has a good appetite and denies weight loss. She denies any nausea, vomiting, constipation, or diarrhea. She has no changes in her bowel movements.  She denies any melena or hematochezia.  She denies any pain. She has no urinary complaints.  Patient offers no further specific complaints today.  REVIEW OF SYSTEMS:   Review of Systems  Constitutional: Negative for fever, malaise/fatigue and weight loss.  HENT: Negative.   Eyes: Negative.   Respiratory: Positive for shortness of breath. Negative for cough.   Cardiovascular: Negative for chest pain and leg swelling.  Gastrointestinal: Negative for abdominal pain, blood in stool, constipation, diarrhea, melena, nausea and vomiting.  Genitourinary: Negative.  Negative for dysuria.  Musculoskeletal: Negative.  Negative for back pain.  Skin: Negative.  Negative for rash.  Neurological: Negative.  Negative for dizziness, weakness and headaches.  Endo/Heme/Allergies: Does not bruise/bleed easily.  Psychiatric/Behavioral: Negative.  The patient is not nervous/anxious.     As per  HPI. Otherwise, a complete review of systems is negative.   PAST MEDICAL HISTORY: Past Medical History:  Diagnosis Date  . Aortic atherosclerosis ([place])   . Arm pain   . Asthma   . CAD in native artery   . Cancer ([place]) 09/2015   rectum  . Cigarette smoker   . Claustrophobia   . Colon cancer ([place])   . 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 COPD (chronic obstructive pulmonary disease) ([place])   . DDD (degenerative disc disease), lumbar       . Dermatophytoses   . Dyslipidemia   . H/O blood clots    left leg, veins stripped  . Hemorrhoids   . Hypertension   . Last menstrual period (LMP) > 10 days ago 1994  . Myocardial infarction [facility])    "mild" - age 41  . Personal history of chemotherapy   . Personal history of radiation therapy   . Pulmonary emphysema ([place])   . Rectal bleeding   . Shortness of breath dyspnea   . Smokers' cough ([place])   . Spinal headache    with one of my children's birth  . Vertigo   . Wears dentures    has full upper and lower - doesn't wear    PAST SURGICAL HISTORY: Past Surgical History:  Procedure Laterality Date  . APPENDECTOMY    . BREAST BIOPSY Left 1970's   neg  . COLONOSCOPY WITH PROPOFOL N/A 11/06/2015   Procedure: COLONOSCOPY WITH PROPOFOL;  Surgeon: Lucilla Lame, MD;  Location: [place];  Service: Endoscopy;  Laterality: N/A;  LEAVE PT EARLY  . EXPLORATORY LAPAROTOMY    . LESION EXCISION  11/03/15  Anal - Dr Dahlia Byes, [facility]  . LESION REMOVAL  1970's   from tailbone  . [place] SURGERY  1997  . OVARIAN CYST REMOVAL Left   . POLYPECTOMY  11/06/2015   Procedure: POLYPECTOMY;  Surgeon: Lucilla Lame, MD;  Location: [place];  Service: Endoscopy;;  . PORTACATH PLACEMENT N/A 12/10/2015   Procedure: INSERTION PORT-A-CATH;  Surgeon: Jules Husbands, MD;  Location: [facility];  Service: General;  Laterality: N/A;  . RECTAL EXAM UNDER ANESTHESIA N/A 06/23/2016   Procedure: RECTAL EXAM UNDER  ANESTHESIA;  Surgeon: Jules Husbands, MD;  Location: [facility];  Service: General;  Laterality: N/A;  . VEIN LIGATION AND STRIPPING      FAMILY HISTORY Family History  Problem Relation Age of Onset  . Cancer Mother        lymphoma  . Heart disease Mother   . Cancer Maternal Grandmother        breast  . Birth defects Maternal Grandmother   . Breast cancer Maternal Grandmother     GYNECOLOGIC HISTORY:  No LMP recorded. Patient is postmenopausal.     ADVANCED DIRECTIVES:    HEALTH MAINTENANCE: Social History   Tobacco Use  . Smoking status: Former Smoker    Packs/day: 1.00    Years: 57.00    Pack years: 57.00    Types: Cigarettes    Last attempt to quit: 03/30/2016    Years since quitting: 2.3  . Smokeless tobacco: Never Used  . Tobacco comment: Quit in July 2017  Substance Use Topics  . Alcohol use: No    Alcohol/week: 0.0 standard drinks  . Drug use: No     Allergies  Allergen Reactions  . Aspirin Nausea And Vomiting  . Codeine Nausea And Vomiting  . Morphine And Related Nausea And Vomiting    Current Outpatient Medications  Medication Sig Dispense Refill  . acetaminophen (TYLENOL) 500 MG tablet Take 1,000 mg by mouth every 6 (six) hours as needed.    Marland Kitchen albuterol (PROVENTIL HFA;VENTOLIN HFA) 108 (90 Base) MCG/ACT inhaler INHALE 2 PUFFS INTO THE LUNGS EVERY 6 HOURS AS NEEDED FOR WHEEZING OR SHORTNESS OF BREATH 54 g 4  . amLODipine (NORVASC) 5 MG tablet TAKE 1 TABLET EVERY DAY 90 tablet 3  . atorvastatin (LIPITOR) 20 MG tablet TAKE 1 TABLET AT BEDTIME 90 tablet 3  . hydrOXYzine (ATARAX/VISTARIL) 25 MG tablet Take 1 tablet (25 mg total) by mouth 2 (two) times daily as needed. 60 tablet 5  . ipratropium-albuterol (DUONEB) 0.5-2.5 (3) MG/3ML SOLN Take 3 mLs by nebulization 3 (three) times daily. 360 mL 1  . loperamide (IMODIUM A-D) 2 MG tablet Take 2 mg by mouth 4 (four) times daily as needed for diarrhea or loose stools.    . Multiple Vitamins-Minerals (CENTRUM  SILVER PO) Take 1 tablet by mouth daily.    . OXYGEN Inhale 2.5 L/min into the lungs continuous.     . TRELEGY ELLIPTA 100-62.5-25 MCG/INH AEPB INHALE 1 PUFF INTO THE LUNGS DAILY. 180 each 3   No current facility-administered medications for this visit.    Facility-Administered Medications Ordered in Other Visits  Medication Dose Route Frequency Provider Last Rate Last Dose  . albuterol (PROVENTIL) (2.5 MG/3ML) 0.083% nebulizer solution 2.5 mg  2.5 mg Nebulization Once Laverle Hobby, MD        OBJECTIVE: BP (!) 165/85 (BP Location: Left Arm, Patient Position: Sitting)   Pulse 94   Temp (!) 96.3 F (35.7 C) (Tympanic)   Resp  18   Wt 177 lb 8 oz (80.5 kg)   BMI 32.47 kg/m    Body mass index is 32.47 kg/m.    ECOG FS:0 - Asymptomatic  General: Well-developed, well-nourished, no acute distress. Eyes: Pink conjunctiva, anicteric sclera. HEENT: Normocephalic, moist mucous membranes. Lungs: Clear to auscultation bilaterally. Heart: Regular rate and rhythm. No rubs, murmurs, or gallops. Abdomen: Soft, nontender, nondistended. No organomegaly noted, normoactive bowel sounds. Musculoskeletal: No edema, cyanosis, or clubbing. Neuro: Alert, answering all questions appropriately. Cranial nerves grossly intact. Skin: No rashes or petechiae noted. Psych: Normal affect.  LAB RESULTS:  No visits with results within 3 Day(s) from this visit.  Latest known visit with results is:  Office Visit on 05/29/2018  Component Date Value Ref Range Status  . WBC 05/29/2018 7.9  3.4 - 10.8 x10E3/uL Final  . RBC 05/29/2018 4.82  3.77 - 5.28 x10E6/uL Final  . Hemoglobin 05/29/2018 15.0  11.1 - 15.9 g/dL Final  . Hematocrit 05/29/2018 43.3  34.0 - 46.6 % Final  . MCV 05/29/2018 90  79 - 97 fL Final  . MCH 05/29/2018 31.1  26.6 - 33.0 pg Final  . MCHC 05/29/2018 34.6  31.5 - 35.7 g/dL Final  . RDW 05/29/2018 13.7  12.3 - 15.4 % Final  . Platelets 05/29/2018 258  150 - 450 x10E3/uL Final  .  Neutrophils 05/29/2018 63  Not Estab. % Final  . Lymphs 05/29/2018 24  Not Estab. % Final  . Monocytes 05/29/2018 7  Not Estab. % Final  . Eos 05/29/2018 5  Not Estab. % Final  . Basos 05/29/2018 1  Not Estab. % Final  . Neutrophils Absolute 05/29/2018 4.9  1.4 - 7.0 x10E3/uL Final  . Lymphocytes Absolute 05/29/2018 1.9  0.7 - 3.1 x10E3/uL Final  . Monocytes Absolute 05/29/2018 0.6  0.1 - 0.9 x10E3/uL Final  . EOS (ABSOLUTE) 05/29/2018 0.4  0.0 - 0.4 x10E3/uL Final  . Basophils Absolute 05/29/2018 0.1  0.0 - 0.2 x10E3/uL Final  . Immature Granulocytes 05/29/2018 0  Not Estab. % Final  . Immature Grans (Abs) 05/29/2018 0.0  0.0 - 0.1 x10E3/uL Final  . Glucose 05/29/2018 83  65 - 99 mg/dL Final  . BUN 05/29/2018 23  8 - 27 mg/dL Final  . Creatinine, Ser 05/29/2018 1.12* 0.57 - 1.00 mg/dL Final  . GFR calc non Af Amer 05/29/2018 49* >59 mL/min/1.73 Final  . GFR calc Af Amer 05/29/2018 57* >59 mL/min/1.73 Final  . BUN/Creatinine Ratio 05/29/2018 21  12 - 28 Final  . Sodium 05/29/2018 141  134 - 144 mmol/L Final  . Potassium 05/29/2018 4.4  3.5 - 5.2 mmol/L Final  . Chloride 05/29/2018 102  96 - 106 mmol/L Final  . CO2 05/29/2018 21  20 - 29 mmol/L Final  . Calcium 05/29/2018 10.4* 8.7 - 10.3 mg/dL Final  . Total Protein 05/29/2018 7.7  6.0 - 8.5 g/dL Final  . Albumin 05/29/2018 4.6  3.5 - 4.8 g/dL Final  . Globulin, Total 05/29/2018 3.1  1.5 - 4.5 g/dL Final  . Albumin/Globulin Ratio 05/29/2018 1.5  1.2 - 2.2 Final  . Bilirubin Total 05/29/2018 0.3  0.0 - 1.2 mg/dL Final  . Alkaline Phosphatase 05/29/2018 84  39 - 117 IU/L Final  . AST 05/29/2018 18  0 - 40 IU/L Final  . ALT 05/29/2018 13  0 - 32 IU/L Final  . TSH 05/29/2018 6.210* 0.450 - 4.500 uIU/mL Final    STUDIES: No results found.  ASSESSMENT:  Squamous cell  carcinoma of the anus, stage II, T2 N0 M0.  PLAN:    1. Squamous cell carcinoma of the anus:Patient completed her concurrent chemotherapy with mitomycin and 5-FU on  Jan 30, 2016 along with her XRT.  Patient's most recent imaging on July 06, 2018 reviewed independently with no evidence of recurrent or progressive disease.  Will continue annual CT scans for a total of 3 years after completing her treatment.  Repeat in October 2020. Patient also reports a recent normal anoscope and has an appointment with GI in the next several weeks.  Per NCCN guidelines, patient will require an anoscope every 6-12 months for 3 years. Will also do abdominal and pelvic CT with contrast annually for 3 years.  Return to clinic in 6 months for routine evaluation.  Follow-up with GI as indicated. 2.  Shortness of breath: Patient was given a referral back to pulmonology for further evaluation.   I spent a total of 30 minutes face-to-face with the patient of which greater than 50% of the visit was spent in counseling and coordination of care as detailed above.  Patient expressed understanding and was in agreement with this plan. She also understands that She can call clinic at any time with any questions, concerns, or complaints.    Squamous cell carcinoma of anus (HCC)   Staging form: Anus, AJCC 7th Edition     Clinical stage from 11/28/2015: Stage II (T2, N0, M0) - Signed by Lloyd Huger, MD on 11/28/2015    Lloyd Huger, MD 07/18/18 6:24 AM

## 2018-07-13 ENCOUNTER — Ambulatory Visit
Admission: RE | Admit: 2018-07-13 | Discharge: 2018-07-13 | Disposition: A | Discharge status: Home / Self Care | Payer: Medicare HMO | Source: Ambulatory Visit | Attending: Radiation Oncology | Admitting: Radiation Oncology

## 2018-07-13 ENCOUNTER — Inpatient Hospital Stay: Payer: Medicare HMO | Attending: Oncology | Admitting: Oncology

## 2018-07-13 ENCOUNTER — Other Ambulatory Visit: Payer: Self-pay

## 2018-07-13 ENCOUNTER — Encounter: Payer: Self-pay | Admitting: Radiation Oncology

## 2018-07-13 VITALS — BP 165/85 | HR 94 | Temp 96.3°F | Resp 18 | Wt 177.5 lb

## 2018-07-13 VITALS — BP 195/119 | HR 107 | Temp 98.0°F | Wt 177.1 lb

## 2018-07-13 DIAGNOSIS — I252 Old myocardial infarction: Secondary | ICD-10-CM | POA: Diagnosis not present

## 2018-07-13 DIAGNOSIS — I2510 Atherosclerotic heart disease of native coronary artery without angina pectoris: Secondary | ICD-10-CM

## 2018-07-13 DIAGNOSIS — Z85048 Personal history of other malignant neoplasm of rectum, rectosigmoid junction, and anus: Secondary | ICD-10-CM | POA: Diagnosis not present

## 2018-07-13 DIAGNOSIS — J449 Chronic obstructive pulmonary disease, unspecified: Secondary | ICD-10-CM | POA: Insufficient documentation

## 2018-07-13 DIAGNOSIS — M5136 Other intervertebral disc degeneration, lumbar region: Secondary | ICD-10-CM | POA: Diagnosis not present

## 2018-07-13 DIAGNOSIS — E785 Hyperlipidemia, unspecified: Secondary | ICD-10-CM

## 2018-07-13 DIAGNOSIS — Z803 Family history of malignant neoplasm of breast: Secondary | ICD-10-CM | POA: Diagnosis not present

## 2018-07-13 DIAGNOSIS — R0602 Shortness of breath: Secondary | ICD-10-CM

## 2018-07-13 DIAGNOSIS — Z923 Personal history of irradiation: Secondary | ICD-10-CM | POA: Insufficient documentation

## 2018-07-13 DIAGNOSIS — Z806 Family history of leukemia: Secondary | ICD-10-CM | POA: Diagnosis not present

## 2018-07-13 DIAGNOSIS — C210 Malignant neoplasm of anus, unspecified: Secondary | ICD-10-CM

## 2018-07-13 DIAGNOSIS — J441 Chronic obstructive pulmonary disease with (acute) exacerbation: Secondary | ICD-10-CM | POA: Diagnosis not present

## 2018-07-13 DIAGNOSIS — Z87891 Personal history of nicotine dependence: Secondary | ICD-10-CM

## 2018-07-13 DIAGNOSIS — R197 Diarrhea, unspecified: Secondary | ICD-10-CM | POA: Insufficient documentation

## 2018-07-13 DIAGNOSIS — F17210 Nicotine dependence, cigarettes, uncomplicated: Secondary | ICD-10-CM | POA: Diagnosis not present

## 2018-07-13 DIAGNOSIS — Z79899 Other long term (current) drug therapy: Secondary | ICD-10-CM | POA: Diagnosis not present

## 2018-07-13 DIAGNOSIS — J439 Emphysema, unspecified: Secondary | ICD-10-CM | POA: Diagnosis not present

## 2018-07-13 DIAGNOSIS — Z9221 Personal history of antineoplastic chemotherapy: Secondary | ICD-10-CM | POA: Diagnosis not present

## 2018-07-13 DIAGNOSIS — I10 Essential (primary) hypertension: Secondary | ICD-10-CM

## 2018-07-13 NOTE — Progress Notes (Signed)
Radiation Oncology Follow up Note  Name: Carol [facility]   Date:   07/13/2018 MRN:  [id number] DOB: 1945-11-30    This 71 y.o. female presents to the clinic today for o year follow-up status post concurrent chemoradiation for stage IIIa squamous cell carcinoma the anus.  REFERRING PROVIDER: Glean Hess, MD  HPI: patient is a 72 year old female now out to 2 years having completed combined modality treatment with chemotherapy and radiation therapy for stage IIIa squamous cell carcinoma of the anus. Seen today in routine follow-up she is doing well still has mild intermittent diarrhea does not take Imodium or following a low residue diet. She's having no painful bowel movements.Marland Kitchenecent CT scan of abdomen and pelvis showed stable exam with no progressiveor interval findings.  COMPLICATIONS OF TREATMENT: none  FOLLOW UP COMPLIANCE: keeps appointments   PHYSICAL EXAM:  There were no vitals taken for this visit. On rectal exam rectal sphincter tone is good. No evidence of mass or nodularity in the anus is identified no inguinal adenopathy is noted. Well-developed well-nourished patient in NAD. HEENT reveals PERLA, EOMI, discs not visualized.  Oral cavity is clear. No oral mucosal lesions are identified. Neck is clear without evidence of cervical or supraclavicular adenopathy. Lungs are clear to A&P. Cardiac examination is essentially unremarkable with regular rate and rhythm without murmur rub or thrill. Abdomen is benign with no organomegaly or masses noted. Motor sensory and DTR levels are equal and symmetric in the upper and lower extremities. Cranial nerves II through XII are grossly intact. Proprioception is intact. No peripheral adenopathy or edema is identified. No motor or sensory levels are noted. Crude visual fields are within normal range.  RADIOLOGY RESULTS: CT scans are reviewed and compatible with the above-stated findings  PLAN: present time patient is doing well with no evidence  of disease. I'm please were overall progress. I've asked to see her back in 1 year for follow-up. Patient is to call with any concerns at any time.  I would like to take this opportunity to thank you for allowing me to participate in the care of your patient.Noreene Filbert, MD

## 2018-07-13 NOTE — Progress Notes (Signed)
Here today stated more SOB   o2 on 2 L ( usually 3-4 she stated )  Pulse ox 88 %on 2L o2  MD aware.

## 2018-07-21 ENCOUNTER — Institutional Professional Consult (permissible substitution): Payer: Self-pay | Admitting: Internal Medicine

## 2018-07-24 ENCOUNTER — Ambulatory Visit: Payer: Self-pay | Admitting: Gastroenterology

## 2018-07-25 ENCOUNTER — Ambulatory Visit
Admission: RE | Admit: 2018-07-25 | Discharge: 2018-07-25 | Disposition: A | Discharge status: Home / Self Care | Payer: Medicare HMO | Source: Ambulatory Visit | Attending: Internal Medicine | Admitting: Internal Medicine

## 2018-07-25 ENCOUNTER — Ambulatory Visit (INDEPENDENT_AMBULATORY_CARE_PROVIDER_SITE_OTHER): Payer: Medicare HMO | Admitting: Internal Medicine

## 2018-07-25 ENCOUNTER — Encounter: Payer: Self-pay | Admitting: Internal Medicine

## 2018-07-25 ENCOUNTER — Other Ambulatory Visit
Admission: RE | Admit: 2018-07-25 | Discharge: 2018-07-25 | Disposition: A | Discharge status: Home / Self Care | Payer: Medicare HMO | Source: Ambulatory Visit | Attending: Internal Medicine | Admitting: Internal Medicine

## 2018-07-25 VITALS — BP 140/62 | HR 82 | Resp 16 | Ht 62.0 in | Wt 178.0 lb

## 2018-07-25 DIAGNOSIS — J449 Chronic obstructive pulmonary disease, unspecified: Secondary | ICD-10-CM

## 2018-07-25 DIAGNOSIS — R0609 Other forms of dyspnea: Secondary | ICD-10-CM | POA: Insufficient documentation

## 2018-07-25 DIAGNOSIS — D71 Functional disorders of polymorphonuclear neutrophils: Secondary | ICD-10-CM | POA: Insufficient documentation

## 2018-07-25 DIAGNOSIS — J439 Emphysema, unspecified: Secondary | ICD-10-CM | POA: Insufficient documentation

## 2018-07-25 DIAGNOSIS — Z87891 Personal history of nicotine dependence: Secondary | ICD-10-CM | POA: Diagnosis not present

## 2018-07-25 DIAGNOSIS — R0602 Shortness of breath: Secondary | ICD-10-CM | POA: Diagnosis not present

## 2018-07-25 NOTE — Patient Instructions (Addendum)
Continue trelegy inhaler, rinse mouth after use.  Use nebulizer 2 to 3 times per day.

## 2018-07-25 NOTE — Progress Notes (Signed)
Carol Gay      Assessment and Plan:  Severe emphysema/COPD. -Patient has very severe emphysema with dyspnea on exertion. -Patient is already maximally treated with Trelegy inhaler, she has quit smoking approximately 2 years ago. -We discussed that her COPD/emphysema is end-stage, we discussed possible referral to tertiary centers for further work-up for possible lung transplantation versus lung volume reduction surgery.  She is not interested in pursuing these, understands that her lung disease is end-stage. - We discussed ways in which we can try to both maximize and preserve her current lung function for as long as possible.  This includes increasing physical activity, and referral to pulmonary rehabilitation. --Declines flu shot ,PCV13 10/29/14; PPSV23 07/22/10.   Acute on Chronic hypoxic respiratory failure. -Continue use of chronic oxygen. - Continue oxygen at 4 L.  Will check chest x-ray today.  Dyspnea on exertion. -Currently due to likely severe emphysema with dynamic air trapping. - Continue oxygen, will refer to pulmonary rehab which may help. - In the future she may benefit from a anxiolytic to help with dyspnea such as mizazolam she tells me she has a strong allergy to morphine.  Advance care planning: -Discussed that her disease end-stage, will focus on trying to make her as comfortable as possible.  Patient understands this, she states that she does not want to be resuscitated kept alive artificially.  She has a DNR currently in place, and has one in her wallet.  She reaffirms her current DNR status.  16 minutes spent discussion.   Orders Placed This Encounter  Procedures  . DG Chest 2 View  . Alpha-1-antitrypsin  . Alpha-1 antitrypsin phenotype  . AMB referral to pulmonary rehabilitation   Return in about 3 months (around 10/25/2018).   Date: 07/25/2018  MRN# [id number] Carol Gay 1946-05-11    Carol Gay is a 72 y.o. old  female seen in [place] for chief complaint of:    Chief Complaint  Patient presents with  . COPD    pt c/o sob on 4 L. She is a mouth breather mentioned to patient she is not getting 02 on POC with mouth open.  . Cough    white-yellow thick mucus  . Wheezing    HPI:  The patient is a 72 year old female with severe COPD, oxygen dependent on 2 L of oxygen.  At last visit she was asked to continue trilogy, asked to increase her physical activity.  She has very severe dyspnea on exertion, and some very severe emphysema with likely dynamic air trapping. Today she notes that her breathing has significantly declined, her oxygen was increased from 2 to 4L constant.  She is using Trelegy daily, not rinsing mouth, ventolin once or twice per day. Her neb machine is broken so she is not using it.  She has been more dyspneic, she has trouble doing her ADL's due to dyspnea. She lives by herself in a house with her dog.  She has occasional reflux. Has mild sinus congestion. She has a dog, not in bed.   **PFT 01/10/2018>> tracings personally reviewed.  FVC 99% predicted, FEV1 70% predicted.  Ratio 55%.  TLC is 139% predicted.  RV is 198% protected.  DLCO severely reduced to 23%.  Overall this test is consistent with severe COPD with significant air trapping. **chest x-ray 05/19/17>> the lungs are severely hyperinflated consistent with severe emphysema. **CBC 10/05/17>> absolute eosinophils 300, as high as 400 on 06/29/16  Medication:    Current Outpatient Medications:  .  acetaminophen (TYLENOL) 500 MG tablet, Take 1,000 mg by mouth every 6 (six) hours as needed., Disp: , Rfl:  .  albuterol (PROVENTIL HFA;VENTOLIN HFA) 108 (90 Base) MCG/ACT inhaler, INHALE 2 PUFFS INTO THE LUNGS EVERY 6 HOURS AS NEEDED FOR WHEEZING OR SHORTNESS OF BREATH, Disp: 54 g, Rfl: 4 .  amLODipine (NORVASC) 5 MG tablet, TAKE 1 TABLET EVERY DAY, Disp: 90 tablet, Rfl: 3 .  atorvastatin (LIPITOR) 20 MG tablet, TAKE 1 TABLET AT  BEDTIME, Disp: 90 tablet, Rfl: 3 .  hydrOXYzine (ATARAX/VISTARIL) 25 MG tablet, Take 1 tablet (25 mg total) by mouth 2 (two) times daily as needed., Disp: 60 tablet, Rfl: 5 .  ipratropium-albuterol (DUONEB) 0.5-2.5 (3) MG/3ML SOLN, Take 3 mLs by nebulization 3 (three) times daily., Disp: 360 mL, Rfl: 1 .  loperamide (IMODIUM A-D) 2 MG tablet, Take 2 mg by mouth 4 (four) times daily as needed for diarrhea or loose stools., Disp: , Rfl:  .  Multiple Vitamins-Minerals (CENTRUM SILVER PO), Take 1 tablet by mouth daily., Disp: , Rfl:  .  OXYGEN, Inhale 2.5 L/min into the lungs continuous. , Disp: , Rfl:  .  TRELEGY ELLIPTA 100-62.5-25 MCG/INH AEPB, INHALE 1 PUFF INTO THE LUNGS DAILY., Disp: 180 each, Rfl: 3 No current facility-administered medications for this visit.   Facility-Administered Medications Ordered in Other Visits:  .  albuterol (PROVENTIL) (2.5 MG/3ML) 0.083% nebulizer solution 2.5 mg, 2.5 mg, Nebulization, Once, Laverle Hobby, MD   Allergies:  Aspirin; Codeine; and Morphine and related   Review of Systems:  Constitutional: Feels well. Cardiovascular: Denies chest pain, exertional chest pain.  Pulmonary: Denies hemoptysis, pleuritic chest pain.   The remainder of systems were reviewed and were found to be negative other than what is documented in the HPI.    Physical Examination:   VS: BP 140/62 (BP Location: Left Arm, Cuff Size: Large)   Pulse 82   Resp 16   Ht 5\' 2"  (1.575 m)   Wt 178 lb (80.7 kg)   SpO2 91%   BMI 32.56 kg/m   General Appearance: No distress  Neuro:without focal findings, mental status, speech normal, alert and oriented HEENT: PERRLA, EOM intact Pulmonary: Decreased air entry bilaterally. CardiovascularNormal S1,S2.  No m/r/g.  Abdomen: Benign, Soft, non-tender, No masses Renal:  No costovertebral tenderness  GU:  No performed at this time. Endoc: No evident thyromegaly, no signs of acromegaly or Cushing features Skin:   warm, no rashes,  no ecchymosis  Extremities: normal, no cyanosis, clubbing.       LABORATORY PANEL:   CBC No results for input(s): WBC, HGB, HCT, PLT in the last 168 hours. ------------------------------------------------------------------------------------------------------------------  Chemistries  No results for input(s): NA, K, CL, CO2, GLUCOSE, BUN, CREATININE, CALCIUM, MG, AST, ALT, ALKPHOS, BILITOT in the last 168 hours.  Invalid input(s): GFRCGP ------------------------------------------------------------------------------------------------------------------  Cardiac Enzymes No results for input(s): TROPONINI in the last 168 hours. ------------------------------------------------------------  RADIOLOGY:  No results found.     Thank  you for the [facility] and for allowing [facility] to assist in the care of your patient. Our recommendations are noted above.  Please contact us if we can be of further service.  Marda Stalker, M.D., F.C.C.P.  Board Certified in Internal Medicine, Pulmonary Medicine, [place], and Sleep Medicine.  [facility] Office Number: [phone]   07/25/2018

## 2018-07-27 ENCOUNTER — Telehealth: Payer: Self-pay | Admitting: Internal Medicine

## 2018-07-27 NOTE — Addendum Note (Signed)
Addended by: Stephanie Coup on: 07/27/2018 02:31 PM   Modules accepted: Orders

## 2018-07-27 NOTE — Telephone Encounter (Signed)
Patient calling States that she will need a prescription for her new nebulizer sent to [facility]  Please advise

## 2018-07-27 NOTE — Telephone Encounter (Signed)
New nebulizer ordered.

## 2018-07-28 LAB — ALPHA-1 ANTITRYPSIN PHENOTYPE: A-1 Antitrypsin, Ser: 165 mg/dL (ref 101–187)

## 2018-08-01 DIAGNOSIS — J449 Chronic obstructive pulmonary disease, unspecified: Secondary | ICD-10-CM | POA: Diagnosis not present

## 2018-08-01 DIAGNOSIS — J441 Chronic obstructive pulmonary disease with (acute) exacerbation: Secondary | ICD-10-CM | POA: Diagnosis not present

## 2018-08-13 DIAGNOSIS — J441 Chronic obstructive pulmonary disease with (acute) exacerbation: Secondary | ICD-10-CM | POA: Diagnosis not present

## 2018-08-16 ENCOUNTER — Other Ambulatory Visit: Payer: Self-pay | Admitting: *Deleted

## 2018-08-16 NOTE — Patient Outreach (Signed)
[facility] [facility]) Care Management  08/16/2018  Carol [facility] 11-04-1945 [id number]   RN Health Coach attempted #3 follow up outreach call to patient.  Patient was unavailable. HIPPA compliance voicemail message left with return callback number.  Plan: RN will close case within 10 business days Unsuccessful outreach letter sent [place] Management [phone]

## 2018-08-21 ENCOUNTER — Telehealth: Payer: Self-pay | Admitting: Internal Medicine

## 2018-08-21 NOTE — Telephone Encounter (Signed)
Left message on voicemail to call back [phone] to schedule AWV. [place]

## 2018-08-23 ENCOUNTER — Ambulatory Visit: Payer: Self-pay | Admitting: Gastroenterology

## 2018-08-31 ENCOUNTER — Other Ambulatory Visit: Payer: Self-pay | Admitting: *Deleted

## 2018-08-31 DIAGNOSIS — J449 Chronic obstructive pulmonary disease, unspecified: Secondary | ICD-10-CM | POA: Diagnosis not present

## 2018-08-31 DIAGNOSIS — J441 Chronic obstructive pulmonary disease with (acute) exacerbation: Secondary | ICD-10-CM | POA: Diagnosis not present

## 2018-08-31 NOTE — Patient Outreach (Signed)
[place] [facility]) Care Management  08/31/2018  ARI ENGELBRECHT 06/02/1946 [id number]    Multiple attempts to establish contact with patient without success. No response from calls or letter mailed to patient.   Plan:Case is being closed at this time due to unable to contact.  Lazaro Arms RN, BSN, [place] Direct Dial:  [phone]  Fax: [phone]

## 2018-09-06 ENCOUNTER — Other Ambulatory Visit: Payer: Self-pay

## 2018-09-06 NOTE — Patient Outreach (Signed)
[facility]) Care Management  09/06/2018   Carol Gay 07/07/1946 [id number]       Outreach attempt # 1 for reassessment.  HIPAA verified.  The patient was sent  a letter to close the case after several attempts to contact her.  She called back and would still like to be in the program. Discussed  [facility] care management services with patient.   Social: The patient lives in the alone in the home with her dog.  She states that she is independent with her ADLS and independent/assist with her IADLS.  She states that she is going to pay someone to come in the home to help her take care of some chores once a month.  She denies any pain or falls.  She states that she is able to drive to appointments if she has someone to go with her because she has oxygen and a walker.  Her sone usually goes with her but he was just release from the hospital after being admitted for month due to seizures.  She states that she has had to cancel two appointments because she did not have any one to go with her. I advised the patient that I will put in a referral for her for transportation.  Patient verbalized understanding.  She states the durable medical equipment she has in the home includes rollator, oxygen that is kept on 3 1/2 -4 liters, reading glasses, shower chair, cane and bedside commode.  Conditions: Per chart review and conversation with the patient her conditions include HTN, Pulmonary emphysema, Lumbar DDD and dyslipidemia. The patient states that she has met with her Pulmonologist and they have told her that there is nothing else that they can do but to continue medications. She states that she has her good and bad days.  She will get short of breath with exertion.  She uses her inhaler and nebulizer when needed but she states that she will sit and rest and that usually helps  Discussed the COPD action plan with the patient and she verbalized understanding.  Medications: Per chart review  and speaking with the patient she is on ten medications.  She sis not express and concerns or problems pay for her medications  Appointments: The patient states that she has an appointment with the pulmonary in February.  Advanced Directives: The patient states that she has a living will.    Current Medications:  Current Outpatient Medications  Medication Sig Dispense Refill  . acetaminophen (TYLENOL) 500 MG tablet Take 1,000 mg by mouth every 6 (six) hours as needed.    Marland Kitchen albuterol (PROVENTIL HFA;VENTOLIN HFA) 108 (90 Base) MCG/ACT inhaler INHALE 2 PUFFS INTO THE LUNGS EVERY 6 HOURS AS NEEDED FOR WHEEZING OR SHORTNESS OF BREATH 54 g 4  . amLODipine (NORVASC) 5 MG tablet TAKE 1 TABLET EVERY DAY 90 tablet 3  . atorvastatin (LIPITOR) 20 MG tablet TAKE 1 TABLET AT BEDTIME 90 tablet 3  . hydrOXYzine (ATARAX/VISTARIL) 25 MG tablet Take 1 tablet (25 mg total) by mouth 2 (two) times daily as needed. 60 tablet 5  . ipratropium-albuterol (DUONEB) 0.5-2.5 (3) MG/3ML SOLN Take 3 mLs by nebulization 3 (three) times daily. 360 mL 1  . loperamide (IMODIUM A-D) 2 MG tablet Take 2 mg by mouth 4 (four) times daily as needed for diarrhea or loose stools.    . OXYGEN Inhale 4 L/min into the lungs continuous.     . TRELEGY ELLIPTA 100-62.5-25 MCG/INH AEPB INHALE 1 PUFF INTO  THE LUNGS DAILY. 180 each 3  . Multiple Vitamins-Minerals (CENTRUM SILVER PO) Take 1 tablet by mouth daily.     No current facility-administered medications for this visit.    Facility-Administered Medications Ordered in Other Visits  Medication Dose Route Frequency Provider Last Rate Last Dose  . albuterol (PROVENTIL) (2.5 MG/3ML) 0.083% nebulizer solution 2.5 mg  2.5 mg Nebulization Once Laverle Hobby, MD        Functional Status:  In your present state of health, do you have any difficulty performing the following activities: 09/06/2018 03/31/2018  Hearing? N N  Vision? N N  Difficulty concentrating or making decisions? N N   Walking or climbing stairs? Y Y  Comment - -  Dressing or bathing? N N  Doing errands, shopping? [place] and eating ? - N  Using the Toilet? - N  In the past six months, have you accidently leaked urine? - N  Do you have problems with loss of bowel control? - N  Managing your Medications? - N  Managing your Finances? - N  Housekeeping or managing your Housekeeping? - Y  Comment - -  Some recent data might be hidden    Fall/Depression Screening: Fall Risk  09/06/2018 03/31/2018 12/28/2017  Falls in the past year? 0 No No  Risk for fall due to : - Impaired balance/gait;Impaired mobility Impaired mobility  Risk for fall due to: Comment - - secondary to fatigue   PHQ 2/9 Scores 09/06/2018 03/31/2018 12/28/2017 05/16/2017 02/24/2017 02/20/2016 11/17/2015  PHQ - 2 Score '1 1 1 ' 0 0 0 0  PHQ- 9 Score - - - - - - -    Assessment: Patient will benefit from health coach outreach for disease management and support.  THN CM Care Plan Problem One     Most Recent Value  Care Plan Problem One  Knowledge Deficit in Self Management of COPD  Role Documenting the Problem One  Health Coach  Care Plan for Problem One  Active  THN Long Term Goal   Patient will not have any readmissions for COPD exacerbations within the next 30 days  THN Long Term Goal Start Date  09/06/18  Interventions for Problem One Long Term Goal  Discussed signs and symptoms, copd action plan and medication adherencs     Plan: RN Health Coach will provide ongoing education for patient on COPD  through phone calls and sending printed information to patient for further discussion.  RN Health Coach will send welcome packet with consent to patient as well as printed information on COPD .  RN Health Coach will send initial barriers letter, assessment, and care plan to primary care physician.  Primary [place] will contact patient in the month of January and patient agrees to next outreach.   Referral for transportation was placed.  Lazaro Arms RN, BSN, [place] Direct Dial:  [phone]  Fax: [phone]

## 2018-09-12 DIAGNOSIS — J441 Chronic obstructive pulmonary disease with (acute) exacerbation: Secondary | ICD-10-CM | POA: Diagnosis not present

## 2018-09-14 ENCOUNTER — Other Ambulatory Visit: Payer: Self-pay

## 2018-09-14 NOTE — Patient Outreach (Signed)
[place] [facility]) Care Management  09/14/2018  DARILYN STORBECK 1945-12-07 [id number]  BSW attempted to contact the patient on today's date to conduct a community resource consult. Unfortunately, today's call was unsuccessful. BSW left the patient a HIPAA compliant voice message requesting a return call.   Plan: BSW will mail the patient an unsuccessful outreach letter. BSW will attempt the patient again within the next four business days.  [name], BSW, CDP [facility] [phone]

## 2018-09-14 NOTE — Patient Outreach (Signed)
[place] [facility]) Care Management  09/14/2018  Carol Gay December 16, 1945 [id number]  Returned call to this BSW from the patient, HIPAA identifiers confirmed. BSW introduced self to the patient and the reason for today's outreach, indicating the patient had been referred to this BSW for assistance with transportation. The patient indicates she has her own transportation and can still drive. The patient is interested in resources for a caregiver to attend appointments due to using a rollator and oxygen. The patient states she gets out of breath easily and it is difficult to get to appointments based on walking distance from the car to the office. The patient is also interested in a caregiver to assist with light housekeeping as she is unable to perform due to COPD. The patient states she continues to be independent with all ADL;s and is able to perform iADL's minus housekeeping.  BSW spoke with the patient about limited resource options for caregivers unless the patient is able to pay privately. The patient is unable if she would be able to afford this but understands she may not qualify for other resources. The patient is agreeable to BSW contacting [facility] to inquire if the patient qualifies for one of their programs. BSW outreached [place] and left a HIPAA compliant voice message requesting a return call.  Plan: BSW to follow up with [facility] if a return call is not received. BSW to outreach the patient within the next week to discuss resources. The patient denies any urgent transportation needs stating her next appointment is not until February.  [name], BSW, CDP [facility] [phone]

## 2018-09-18 ENCOUNTER — Ambulatory Visit: Payer: Self-pay

## 2018-09-18 NOTE — Telephone Encounter (Signed)
Spoke with patient on 09/15/18 at 10:20 am.  Scheduled AWV for 10/09/18 at 2:00 pm. lec

## 2018-09-21 ENCOUNTER — Other Ambulatory Visit: Payer: Self-pay

## 2018-09-21 ENCOUNTER — Ambulatory Visit: Payer: Self-pay

## 2018-09-21 NOTE — Patient Outreach (Signed)
[place] [facility]) Care Management  09/21/2018  Carol Gay 1945-12-21 [id number]  Unsuccessful call to the patient to discuss caregiver resources. BSW left a HIPAA compliant voice message requesting a return call. BSW will attempt to outreach the patient within the next four business days.  [name], BSW, CDP [facility] [phone]

## 2018-09-21 NOTE — Patient Outreach (Signed)
[place] [facility]) Care Management  09/21/2018  THOMASINE KLUTTS 1946/06/06 [id number]  Return call from the patient, HIPAA identifiers confirmed. BSW discussed inability to locate resources through [facility] to assist with companion services to physician appointments. BSW spoke with the patient regarding the option to hire someone to assist. The patient requests BSW mail a list of home care providers who service [place].   Plan: BSW to outreach the patient in the next two weeks to confirm receipt of resources.  [name], BSW, CDP [facility] [phone]

## 2018-09-27 ENCOUNTER — Ambulatory Visit: Payer: Self-pay

## 2018-10-01 DIAGNOSIS — J441 Chronic obstructive pulmonary disease with (acute) exacerbation: Secondary | ICD-10-CM | POA: Diagnosis not present

## 2018-10-01 DIAGNOSIS — J449 Chronic obstructive pulmonary disease, unspecified: Secondary | ICD-10-CM | POA: Diagnosis not present

## 2018-10-05 ENCOUNTER — Other Ambulatory Visit: Payer: Self-pay

## 2018-10-05 ENCOUNTER — Ambulatory Visit: Payer: Self-pay

## 2018-10-05 NOTE — Patient Outreach (Signed)
[facility]) Care Management  10/05/2018  Carol Gay [birth year] [id number]  Return call from the patient, HIPAA identifiers confirmed. The patient confirms receipt of mailed resources and reports she plans to call a few agencies tomorrow to discuss needed services. BSW provided the patient with the contact number to [place] to apply for the in-home caregiver grant. The patient reports she will call "tomorrow".   The patient denies any other community resource needs at this time. BSW discussed case closure with the patient as resources have been provided. The patient is in agreement with this plan and reports she has this BSW's number and will call if further assistance is needed.  [name], BSW, CDP [facility] [phone]

## 2018-10-05 NOTE — Patient Outreach (Signed)
[facility]) Care Management  10/05/2018  Carol Gay [birth year] [id number]  Unsuccessful outreach to the patient in efforts to confirm receipt of mailed resources. BSW left a HIPAA compliant voice message requesting a return call.  BSW will attempt to outreach the patient within the next four business days pending no return call.  [name], BSW, CDP [facility] [phone]

## 2018-10-07 ENCOUNTER — Other Ambulatory Visit: Payer: Self-pay | Admitting: Internal Medicine

## 2018-10-09 ENCOUNTER — Ambulatory Visit: Payer: Self-pay

## 2018-10-09 NOTE — Telephone Encounter (Signed)
PATIENT WILL NEED TRANSPORTATION BEFORE SHE CAN SCHEDULE AN CPE/MAW. SHE WILL CALL BACK TO SCHEDULE APPT LATER.

## 2018-10-10 ENCOUNTER — Other Ambulatory Visit: Payer: Self-pay | Admitting: *Deleted

## 2018-10-10 ENCOUNTER — Ambulatory Visit: Payer: Self-pay

## 2018-10-10 NOTE — Patient Outreach (Signed)
[place] [facility]) Care Management  10/10/2018  Carol Gay 06/11/1946 [id number]   RN Health Coach attempted #1 follow up outreach call to patient.  Patient was unavailable. HIPPA compliance voicemail message left with return callback number.  Plan: RN will call patient again within 30 days.  [place] Care Management [phone]

## 2018-10-13 ENCOUNTER — Other Ambulatory Visit: Payer: Self-pay | Admitting: *Deleted

## 2018-10-13 ENCOUNTER — Telehealth: Payer: Self-pay | Admitting: Internal Medicine

## 2018-10-13 DIAGNOSIS — J441 Chronic obstructive pulmonary disease with (acute) exacerbation: Secondary | ICD-10-CM | POA: Diagnosis not present

## 2018-10-13 NOTE — Patient Outreach (Signed)
[place] [facility]) Care Management  10/13/2018  Carol Gay Jun 01, 1946 [id number]   RN Health Coach attempted #2 follow up outreach call to patient.  Patient was unavailable. Unable to leave Voicemail message.  Plan: RN will call patient again within 30 days.  [place] Care Management [phone]

## 2018-10-13 NOTE — Telephone Encounter (Signed)
Called patient to reschedule AWV appointment that was cancelled 10/05/2018 due to transportation problems.  Patient states she is going to wait to schedule the AWV appointment due to not having anyone to bring her to the appointment.

## 2018-10-27 ENCOUNTER — Other Ambulatory Visit: Payer: Self-pay | Admitting: *Deleted

## 2018-10-27 NOTE — Patient Outreach (Signed)
[place] [facility]) Care Management  10/27/2018  MACARENA LANGSETH 1946/09/02 [id number]   RN Health Coach attempted follow up outreach call to patient.  Patient was unavailable. HIPPA compliance voicemail message left with return callback number.  Plan: RN will call patient again within 30 days.  [place] Care Management [phone]

## 2018-11-01 DIAGNOSIS — J441 Chronic obstructive pulmonary disease with (acute) exacerbation: Secondary | ICD-10-CM | POA: Diagnosis not present

## 2018-11-01 DIAGNOSIS — J449 Chronic obstructive pulmonary disease, unspecified: Secondary | ICD-10-CM | POA: Diagnosis not present

## 2018-11-13 DIAGNOSIS — J441 Chronic obstructive pulmonary disease with (acute) exacerbation: Secondary | ICD-10-CM | POA: Diagnosis not present

## 2018-11-23 ENCOUNTER — Other Ambulatory Visit: Payer: Self-pay | Admitting: *Deleted

## 2018-11-23 NOTE — Patient Outreach (Signed)
[place] [facility]) Care Management  11/23/2018  PAM VANALSTINE [birth year] [id number]   RN Health Coach attempted follow up outreach call to patient.  Patient was unavailable. HIPPA compliance voicemail message left with return callback number.  Plan: RN will call patient again within 30 days.  [place] Care Management [phone]

## 2018-11-30 DIAGNOSIS — J441 Chronic obstructive pulmonary disease with (acute) exacerbation: Secondary | ICD-10-CM | POA: Diagnosis not present

## 2018-11-30 DIAGNOSIS — J449 Chronic obstructive pulmonary disease, unspecified: Secondary | ICD-10-CM | POA: Diagnosis not present

## 2018-12-12 DIAGNOSIS — J441 Chronic obstructive pulmonary disease with (acute) exacerbation: Secondary | ICD-10-CM | POA: Diagnosis not present

## 2018-12-21 ENCOUNTER — Other Ambulatory Visit: Payer: Self-pay | Admitting: *Deleted

## 2018-12-21 NOTE — Patient Outreach (Signed)
[place] [facility]) Care Management  12/21/2018  PAQUITA PRINTY November 11, 1945 [id number]  Outreach attempt: Multiple outreach attempts to establish contact with patient without success. No response from letter mailed to patient. Case is being closed at this time.  RN Health Close will close case based on inability to  Establish contact with patient  [place] Management [phone]

## 2018-12-31 DIAGNOSIS — J449 Chronic obstructive pulmonary disease, unspecified: Secondary | ICD-10-CM | POA: Diagnosis not present

## 2018-12-31 DIAGNOSIS — J441 Chronic obstructive pulmonary disease with (acute) exacerbation: Secondary | ICD-10-CM | POA: Diagnosis not present

## 2019-01-03 ENCOUNTER — Ambulatory Visit: Payer: Self-pay

## 2019-01-11 ENCOUNTER — Ambulatory Visit: Payer: Self-pay | Admitting: Oncology

## 2019-01-11 ENCOUNTER — Other Ambulatory Visit: Payer: Self-pay

## 2019-01-12 DIAGNOSIS — J441 Chronic obstructive pulmonary disease with (acute) exacerbation: Secondary | ICD-10-CM | POA: Diagnosis not present

## 2019-01-26 ENCOUNTER — Other Ambulatory Visit: Payer: Self-pay | Admitting: Internal Medicine

## 2019-01-26 DIAGNOSIS — J439 Emphysema, unspecified: Secondary | ICD-10-CM

## 2019-01-30 DIAGNOSIS — J441 Chronic obstructive pulmonary disease with (acute) exacerbation: Secondary | ICD-10-CM | POA: Diagnosis not present

## 2019-01-30 DIAGNOSIS — J449 Chronic obstructive pulmonary disease, unspecified: Secondary | ICD-10-CM | POA: Diagnosis not present

## 2019-02-02 ENCOUNTER — Encounter: Payer: Self-pay | Admitting: Internal Medicine

## 2019-02-02 ENCOUNTER — Other Ambulatory Visit: Payer: Self-pay

## 2019-02-02 ENCOUNTER — Ambulatory Visit (INDEPENDENT_AMBULATORY_CARE_PROVIDER_SITE_OTHER): Payer: Medicare HMO | Admitting: Internal Medicine

## 2019-02-02 VITALS — BP 124/78 | HR 90 | Ht 62.0 in | Wt 182.0 lb

## 2019-02-02 DIAGNOSIS — E785 Hyperlipidemia, unspecified: Secondary | ICD-10-CM

## 2019-02-02 DIAGNOSIS — J438 Other emphysema: Secondary | ICD-10-CM

## 2019-02-02 DIAGNOSIS — E559 Vitamin D deficiency, unspecified: Secondary | ICD-10-CM | POA: Diagnosis not present

## 2019-02-02 DIAGNOSIS — I700 Atherosclerosis of aorta: Secondary | ICD-10-CM | POA: Diagnosis not present

## 2019-02-02 DIAGNOSIS — Z85048 Personal history of other malignant neoplasm of rectum, rectosigmoid junction, and anus: Secondary | ICD-10-CM | POA: Diagnosis not present

## 2019-02-02 DIAGNOSIS — I10 Essential (primary) hypertension: Secondary | ICD-10-CM | POA: Diagnosis not present

## 2019-02-02 MED ORDER — HYDROXYZINE HCL 25 MG PO TABS: 25.0000 mg | ORAL_TABLET | Freq: Two times a day (BID) | ORAL | 3 refills | Status: DC | PRN

## 2019-02-02 NOTE — Progress Notes (Signed)
Date:  02/02/2019   Name:  Carol Gay   DOB:  [birth year]   MRN:  [id number]   Chief Complaint: COPD (Came in with her son Carol Gay today. ); Hyperlipidemia; and Hypertension (Needs refill on hydroxyzine.) She continues to live alone but her son lives in a camper next door.  He cooks and shops for her.  She is independent in her ADLs - she just paces herself and rests often.   Hypertension  This is a chronic problem. The problem is controlled. Associated symptoms include shortness of breath. Pertinent negatives include no chest pain, headaches or palpitations. Past treatments include calcium channel blockers. The current treatment provides significant improvement.  Hyperlipidemia  The problem is controlled. Associated symptoms include shortness of breath. Pertinent negatives include no chest pain. Current antihyperlipidemic treatment includes statins. The current treatment provides significant improvement of lipids. There are no compliance problems.   COPD - stable chronic severe, requiring O2 therapy continuously. Using Trelegy and duo-nebs.  No recent flares or ER visits.  Last seen by Pulmonology and felt to be end-stage.  DNR established.  She declined referral for lung reduction surgery or transplant.  She is here in a wheelchair today due to SOB.  Vitamin D def - not on supplementation with no recent level.  Last level very low @ 7.4.  She believes that she took a supplement at one time but does not know when that was stopped.  Anal Cancer - last seen in October by Oncology.  Assessment: 1. Squamous cell carcinoma of the anus:Patient completed her concurrent chemotherapy with mitomycin and 5-FU on Jan 30, 2016 along with her XRT.  Patient's most recent imaging on July 06, 2018 reviewed independently with no evidence of recurrent or progressive disease.  Will continue annual CT scans for a total of 3 years after completing her treatment.  Repeat in October 2020. Patient also reports a  recent normal anoscope and has an appointment with GI in the next several weeks.  Per NCCN guidelines, patient will require an anoscope every 6-12 months for 3 years. Will also do abdominal and pelvic CT with contrast annually for 3 years.  Return to clinic in 6 months for routine evaluation.  Follow-up with GI as indicated. She cancelled her GI appointment and has not rescheduled.  Her Onc appt was cancelled due to Covid-19. She is scheduled to see RadOnc in October.  Review of Systems  Constitutional: Positive for fatigue. Negative for appetite change, chills, fever and unexpected weight change.  HENT: Negative for tinnitus and trouble swallowing.   Eyes: Negative for visual disturbance.  Respiratory: Positive for cough, chest tightness, shortness of breath and wheezing.   Cardiovascular: Negative for chest pain, palpitations and leg swelling.  Gastrointestinal: Negative for abdominal pain, blood in stool and vomiting. Diarrhea: much improved.  Endocrine: Negative for polydipsia and polyuria.  Genitourinary: Negative for dysuria and hematuria.  Musculoskeletal: Positive for back pain and gait problem. Negative for arthralgias.  Skin: Negative for color change and rash.  Neurological: Positive for weakness. Negative for tremors, numbness and headaches.  Psychiatric/Behavioral: Negative for dysphoric mood and sleep disturbance.    Patient Active Problem List   Diagnosis Date Noted   Dependence on continuous supplemental oxygen 12/28/2017   Vitamin D deficiency 08/23/2017   Age-related osteoporosis without current pathological fracture 08/22/2017   Cyst of subcutaneous tissue 05/19/2017   HPV (human papilloma virus) anogenital infection 07/07/2016   Edema extremities 05/05/2016   History of anal cancer  11/26/2015   Blood in stool    Benign neoplasm of transverse colon    Benign neoplasm of rectosigmoid junction    Fistula of intestine, excluding rectum and anus    Pulmonary  emphysema ([place]) 10/24/2015   Encounter for long-term (current) use of medications 02/28/2015   Aortic atherosclerosis ([place]) 12/30/2014   CAD in native artery 12/30/2014   DDD (degenerative disc disease), lumbar 12/30/2014   Dyslipidemia 12/30/2014   Essential (primary) hypertension 12/30/2014   Dermatophytoses 12/30/2014   Compulsive tobacco user syndrome 12/30/2014    Allergies  Allergen Reactions   Aspirin Nausea And Vomiting   Codeine Nausea And Vomiting   Morphine And Related Nausea And Vomiting    Past Surgical History:  Procedure Laterality Date   APPENDECTOMY     BREAST BIOPSY Left 1970's   neg   COLONOSCOPY WITH PROPOFOL N/A 11/06/2015   Procedure: COLONOSCOPY WITH PROPOFOL;  Surgeon: Lucilla Lame, MD;  Location: [place];  Service: Endoscopy;  Laterality: N/A;  LEAVE PT EARLY   EXPLORATORY LAPAROTOMY     LESION EXCISION  11/03/15   Anal - Dr Alla Feeling surg   LESION REMOVAL  1970's   from [place] CYST REMOVAL Left    POLYPECTOMY  11/06/2015   Procedure: POLYPECTOMY;  Surgeon: Lucilla Lame, MD;  Location: [place];  Service: Endoscopy;;   PORTACATH PLACEMENT N/A 12/10/2015   Procedure: INSERTION PORT-A-CATH;  Surgeon: Jules Husbands, MD;  Location: [facility];  Service: General;  Laterality: N/A;   RECTAL EXAM UNDER ANESTHESIA N/A 06/23/2016   Procedure: RECTAL EXAM UNDER ANESTHESIA;  Surgeon: Jules Husbands, MD;  Location: [facility];  Service: General;  Laterality: N/A;   VEIN LIGATION AND STRIPPING      Social History   Tobacco Use   Smoking status: Former Smoker    Packs/day: 1.00    Years: 57.00    Pack years: 57.00    Types: Cigarettes    Last attempt to quit: 03/30/2016    Years since quitting: 2.8   Smokeless tobacco: Never Used   Tobacco comment: Quit in July 2017  Substance Use Topics   Alcohol use: No    Alcohol/week: 0.0 standard drinks   Drug use: No      Medication list has been reviewed and updated.  Current Meds  Medication Sig   acetaminophen (TYLENOL) 500 MG tablet Take 1,000 mg by mouth every 6 (six) hours as needed.   albuterol (PROVENTIL HFA;VENTOLIN HFA) 108 (90 Base) MCG/ACT inhaler INHALE 2 PUFFS INTO THE LUNGS EVERY 6 HOURS AS NEEDED FOR WHEEZING OR SHORTNESS OF BREATH   amLODipine (NORVASC) 5 MG tablet TAKE 1 TABLET EVERY DAY   atorvastatin (LIPITOR) 20 MG tablet TAKE 1 TABLET AT BEDTIME   hydrOXYzine (ATARAX/VISTARIL) 25 MG tablet Take 1 tablet (25 mg total) by mouth 2 (two) times daily as needed.   ipratropium-albuterol (DUONEB) 0.5-2.5 (3) MG/3ML SOLN Take 3 mLs by nebulization 3 (three) times daily.   Magnesium 250 MG TABS Take by mouth.   Multiple Vitamins-Minerals (CENTRUM SILVER PO) Take 1 tablet by mouth daily.   OXYGEN Inhale 4 L/min into the lungs continuous.    Potassium 99 MG TABS Take by mouth.   TRELEGY ELLIPTA 100-62.5-25 MCG/INH AEPB INHALE 1 PUFF INTO THE LUNGS DAILY.    PHQ 2/9 Scores 09/06/2018 03/31/2018 12/28/2017 05/16/2017  PHQ - 2 Score 1 1 1  0  PHQ- 9 Score - - - -  BP Readings from Last 3 Encounters:  02/02/19 124/78  07/25/18 140/62  07/13/18 (!) 195/119    Physical Exam Vitals signs and nursing note reviewed.  Constitutional:      General: She is not in acute distress.    Appearance: Normal appearance. She is well-developed.  HENT:     Head: Normocephalic and atraumatic.  Neck:     Musculoskeletal: Normal range of motion.     Vascular: No carotid bruit.  Cardiovascular:     Rate and Rhythm: Normal rate and regular rhythm.     Pulses: Normal pulses.     Heart sounds: No murmur.  Pulmonary:     Effort: Pulmonary effort is normal. Prolonged expiration present. No accessory muscle usage or respiratory distress.     Breath sounds: Decreased air movement present. No wheezing or rhonchi.  Abdominal:     General: Bowel sounds are normal.     Palpations: Abdomen is soft.      Tenderness: There is abdominal tenderness in the left upper quadrant. There is no guarding or rebound.  Musculoskeletal: Normal range of motion.     Comments: Fatty deposits both lateral knees  Lymphadenopathy:     Cervical: No cervical adenopathy.  Skin:    General: Skin is warm and dry.     Findings: No lesion or rash.  Neurological:     Mental Status: She is alert and oriented to person, place, and time.  Psychiatric:        Attention and Perception: Attention normal.        Mood and Affect: Mood and affect normal.        Behavior: Behavior normal.        Thought Content: Thought content normal.     Wt Readings from Last 3 Encounters:  02/02/19 182 lb (82.6 kg)  07/25/18 178 lb (80.7 kg)  07/13/18 177 lb 2.2 oz (80.3 kg)    BP 124/78    Pulse 90    Ht 5\' 2"  (1.575 m)    Wt 182 lb (82.6 kg)    SpO2 (!) 83% Comment: started at 77   BMI 33.29 kg/m   Assessment and Plan: 1. Essential (primary) hypertension controlled - CBC with Differential/Platelet - Comprehensive metabolic panel - TSH  2. Other emphysema (HCC) End-stage COPD Continue O2, current therapy  3. Dyslipidemia On statin therapy - Lipid panel  4. Aortic atherosclerosis (HCC) Seen on CT  5. Vitamin D deficiency Begin vitamin D 1000 IU daily - VITAMIN D 25 Hydroxy (Vit-D Deficiency, Fractures)  6. History of anal cancer Will refer back to GI for anoscopy that is recommended every 6 months   Partially dictated using Editor, commissioning. Any errors are unintentional.  Halina Maidens, MD [place] [facility]  02/02/2019

## 2019-02-02 NOTE — Patient Instructions (Signed)
Start Vitamin D 1000 IU daily - take indefinitely.

## 2019-02-03 LAB — COMPREHENSIVE METABOLIC PANEL
ALT: 13 IU/L (ref 0–32)
AST: 19 IU/L (ref 0–40)
Albumin/Globulin Ratio: 1.4 (ref 1.2–2.2)
Albumin: 4.6 g/dL (ref 3.7–4.7)
Alkaline Phosphatase: 86 IU/L (ref 39–117)
BUN/Creatinine Ratio: 25 (ref 12–28)
BUN: 24 mg/dL (ref 8–27)
Bilirubin Total: 0.3 mg/dL (ref 0.0–1.2)
CO2: 24 mmol/L (ref 20–29)
Calcium: 10.5 mg/dL — ABNORMAL HIGH (ref 8.7–10.3)
Chloride: 101 mmol/L (ref 96–106)
Creatinine, Ser: 0.96 mg/dL (ref 0.57–1.00)
GFR calc Af Amer: 68 mL/min/{1.73_m2} (ref >59)
GFR calc non Af Amer: 59 mL/min/{1.73_m2} — ABNORMAL LOW (ref >59)
Globulin, Total: 3.3 g/dL (ref 1.5–4.5)
Glucose: 96 mg/dL (ref 65–99)
Potassium: 4.7 mmol/L (ref 3.5–5.2)
Sodium: 140 mmol/L (ref 134–144)
Total Protein: 7.9 g/dL (ref 6.0–8.5)

## 2019-02-03 LAB — CBC WITH DIFFERENTIAL/PLATELET
Basophils Absolute: 0.1 10*3/uL (ref 0.0–0.2)
Basos: 1 %
EOS (ABSOLUTE): 0.3 10*3/uL (ref 0.0–0.4)
Eos: 4 %
Hematocrit: 41.5 % (ref 34.0–46.6)
Hemoglobin: 14.4 g/dL (ref 11.1–15.9)
Immature Grans (Abs): 0 10*3/uL (ref 0.0–0.1)
Immature Granulocytes: 0 %
Lymphocytes Absolute: 1.6 10*3/uL (ref 0.7–3.1)
Lymphs: 20 %
MCH: 30.4 pg (ref 26.6–33.0)
MCHC: 34.7 g/dL (ref 31.5–35.7)
MCV: 88 fL (ref 79–97)
Monocytes Absolute: 0.6 10*3/uL (ref 0.1–0.9)
Monocytes: 8 %
Neutrophils Absolute: 5.2 10*3/uL (ref 1.4–7.0)
Neutrophils: 67 %
Platelets: 282 10*3/uL (ref 150–450)
RBC: 4.73 x10E6/uL (ref 3.77–5.28)
RDW: 13.4 % (ref 11.7–15.4)
WBC: 7.8 10*3/uL (ref 3.4–10.8)

## 2019-02-03 LAB — LIPID PANEL
Chol/HDL Ratio: 2.3 ratio (ref 0.0–4.4)
Cholesterol, Total: 131 mg/dL (ref 100–199)
HDL: 58 mg/dL (ref >39)
LDL Calculated: 43 mg/dL (ref 0–99)
Triglycerides: 151 mg/dL — ABNORMAL HIGH (ref 0–149)
VLDL Cholesterol Cal: 30 mg/dL (ref 5–40)

## 2019-02-03 LAB — TSH: TSH: 4.96 u[IU]/mL — ABNORMAL HIGH (ref 0.450–4.500)

## 2019-02-03 LAB — VITAMIN D 25 HYDROXY (VIT D DEFICIENCY, FRACTURES): Vit D, 25-Hydroxy: 9.2 ng/mL — ABNORMAL LOW (ref 30.0–100.0)

## 2019-03-02 DIAGNOSIS — J441 Chronic obstructive pulmonary disease with (acute) exacerbation: Secondary | ICD-10-CM | POA: Diagnosis not present

## 2019-03-02 DIAGNOSIS — J449 Chronic obstructive pulmonary disease, unspecified: Secondary | ICD-10-CM | POA: Diagnosis not present

## 2019-03-06 ENCOUNTER — Telehealth: Payer: Self-pay | Admitting: Gastroenterology

## 2019-03-06 NOTE — Telephone Encounter (Signed)
Feldpausch, Ginger, CMA  Nicolina Hirt L        See message below. Looks like she had an office appt with Dr. Allen Norris on 08/23/18 and she cancelled due to transportation issues. Could you contact her and schedule an appt for Hx of anal cancer? Thank you!   Previous Messages  ----- Message -----  From: Glean Hess, MD  Sent: 02/02/2019  2:16 PM EDT  To: [place], CMA  Subject: anoscopy                     This patient was supposed to see Dr. Allen Norris several months ago to have surveillance anoscopy from her rectal cancer. Someone cancelled that appointment and the patient has not rescheduled. I saw her today in clinic and urged her to have this done. She is aware and will await a call from your office.  Thanks

## 2019-03-06 NOTE — Telephone Encounter (Signed)
Per Ginger, make an appt w/ Dr. Allen Norris per pcp. LVM to call our office.

## 2019-03-06 NOTE — Telephone Encounter (Signed)
08/23/2018 Cancel Rsn: Patient (No transportation)

## 2019-03-07 ENCOUNTER — Encounter: Payer: Self-pay | Admitting: *Deleted

## 2019-03-07 NOTE — Telephone Encounter (Signed)
Mailed "unable to contact" letter to home and Dr. Army Melia.

## 2019-03-24 ENCOUNTER — Other Ambulatory Visit: Payer: Self-pay | Admitting: Internal Medicine

## 2019-04-01 DIAGNOSIS — J449 Chronic obstructive pulmonary disease, unspecified: Secondary | ICD-10-CM | POA: Diagnosis not present

## 2019-04-01 DIAGNOSIS — J441 Chronic obstructive pulmonary disease with (acute) exacerbation: Secondary | ICD-10-CM | POA: Diagnosis not present

## 2019-05-02 DIAGNOSIS — J449 Chronic obstructive pulmonary disease, unspecified: Secondary | ICD-10-CM | POA: Diagnosis not present

## 2019-05-02 DIAGNOSIS — J441 Chronic obstructive pulmonary disease with (acute) exacerbation: Secondary | ICD-10-CM | POA: Diagnosis not present

## 2019-06-02 DIAGNOSIS — J449 Chronic obstructive pulmonary disease, unspecified: Secondary | ICD-10-CM | POA: Diagnosis not present

## 2019-06-02 DIAGNOSIS — J441 Chronic obstructive pulmonary disease with (acute) exacerbation: Secondary | ICD-10-CM | POA: Diagnosis not present

## 2019-06-27 ENCOUNTER — Ambulatory Visit (INDEPENDENT_AMBULATORY_CARE_PROVIDER_SITE_OTHER): Payer: Medicare HMO

## 2019-06-27 VITALS — HR 83 | Ht 62.0 in | Wt 166.0 lb

## 2019-06-27 DIAGNOSIS — Z0000 Encounter for general adult medical examination without abnormal findings: Secondary | ICD-10-CM

## 2019-06-27 NOTE — Patient Instructions (Signed)
Carol Gay , Thank you for taking time to come for your Medicare Wellness Visit. I appreciate your ongoing commitment to your health goals. Please review the following plan we discussed and let me know if I can assist you in the future.   Screening recommendations/referrals: Colonoscopy: done 11/06/15 Mammogram: done 08/22/17 Bone Density: done 08/22/17 Recommended yearly ophthalmology/optometry visit for glaucoma screening and checkup Recommended yearly dental visit for hygiene and checkup  Vaccinations: Influenza vaccine: postponed Pneumococcal vaccine: done 10/29/14 Tdap vaccine: done 09/02/15 Shingles vaccine: Shingrix discussed. Please contact your pharmacy for coverage information.   Advanced directives: Please bring a copy of your health care power of attorney and living will to the office at your convenience.  Conditions/risks identified: Recommend drinking 5-6 glasses of water per day  Next appointment: Please follow up in one year for your Medicare Annual Wellness visit.     Preventive Care 74 Years and Older, Female Preventive care refers to lifestyle choices and visits with your health care provider that can promote health and wellness. What does preventive care include?  A yearly physical exam. This is also called an annual well check.  Dental exams once or twice a year.  Routine eye exams. Ask your health care provider how often you should have your eyes checked.  Personal lifestyle choices, including:  Daily care of your teeth and gums.  Regular physical activity.  Eating a healthy diet.  Avoiding tobacco and drug use.  Limiting alcohol use.  Practicing safe sex.  Taking low-dose aspirin every day.  Taking vitamin and mineral supplements as recommended by your health care provider. What happens during an annual well check? The services and screenings done by your health care provider during your annual well check will depend on your age, overall health,  lifestyle risk factors, and family history of disease. Counseling  Your health care provider may ask you questions about your:  Alcohol use.  Tobacco use.  Drug use.  Emotional well-being.  Home and relationship well-being.  Sexual activity.  Eating habits.  History of falls.  Memory and ability to understand (cognition).  Work and work Statistician.  Reproductive health. Screening  You may have the following tests or measurements:  Height, weight, and BMI.  Blood pressure.  Lipid and cholesterol levels. These may be checked every 5 years, or more frequently if you are over 62 years old.  Skin check.  Lung cancer screening. You may have this screening every year starting at age 23 if you have a 30-pack-year history of smoking and currently smoke or have quit within the past 15 years.  Fecal occult blood test (FOBT) of the stool. You may have this test every year starting at age 55.  Flexible sigmoidoscopy or colonoscopy. You may have a sigmoidoscopy every 5 years or a colonoscopy every 10 years starting at age 60.  Hepatitis C blood test.  Hepatitis B blood test.  Sexually transmitted disease (STD) testing.  Diabetes screening. This is done by checking your blood sugar (glucose) after you have not eaten for a while (fasting). You may have this done every 1-3 years.  Bone density scan. This is done to screen for osteoporosis. You may have this done starting at age 43.  Mammogram. This may be done every 1-2 years. Talk to your health care provider about how often you should have regular mammograms. Talk with your health care provider about your test results, treatment options, and if necessary, the need for more tests. Vaccines  Your health  care provider may recommend certain vaccines, such as:  Influenza vaccine. This is recommended every year.  Tetanus, diphtheria, and acellular pertussis (Tdap, Td) vaccine. You may need a Td booster every 10 years.  Zoster  vaccine. You may need this after age 52.  Pneumococcal 13-valent conjugate (PCV13) vaccine. One dose is recommended after age 63.  Pneumococcal polysaccharide (PPSV23) vaccine. One dose is recommended after age 64. Talk to your health care provider about which screenings and vaccines you need and how often you need them. This information is not intended to replace advice given to you by your health care provider. Make sure you discuss any questions you have with your health care provider. Document Released: 10/03/2015 Document Revised: 05/26/2016 Document Reviewed: 07/08/2015 Elsevier Interactive Patient Education  2017 [place] Prevention in the Home Falls can cause injuries. They can happen to people of all ages. There are many things you can do to make your home safe and to help prevent falls. What can I do on the outside of my home?  Regularly fix the edges of walkways and driveways and fix any cracks.  Remove anything that might make you trip as you walk through a door, such as a raised step or threshold.  Trim any bushes or trees on the path to your home.  Use bright outdoor lighting.  Clear any walking paths of anything that might make someone trip, such as rocks or tools.  Regularly check to see if handrails are loose or broken. Make sure that both sides of any steps have handrails.  Any raised decks and porches should have guardrails on the edges.  Have any leaves, snow, or ice cleared regularly.  Use sand or salt on walking paths during winter.  Clean up any spills in your garage right away. This includes oil or grease spills. What can I do in the bathroom?  Use night lights.  Install grab bars by the toilet and in the tub and shower. Do not use towel bars as grab bars.  Use non-skid mats or decals in the tub or shower.  If you need to sit down in the shower, use a plastic, non-slip stool.  Keep the floor dry. Clean up any water that spills on the  floor as soon as it happens.  Remove soap buildup in the tub or shower regularly.  Attach bath mats securely with double-sided non-slip rug tape.  Do not have throw rugs and other things on the floor that can make you trip. What can I do in the bedroom?  Use night lights.  Make sure that you have a light by your bed that is easy to reach.  Do not use any sheets or blankets that are too big for your bed. They should not hang down onto the floor.  Have a firm chair that has side arms. You can use this for support while you get dressed.  Do not have throw rugs and other things on the floor that can make you trip. What can I do in the kitchen?  Clean up any spills right away.  Avoid walking on wet floors.  Keep items that you use a lot in easy-to-reach places.  If you need to reach something above you, use a strong step stool that has a grab bar.  Keep electrical cords out of the way.  Do not use floor polish or wax that makes floors slippery. If you must use wax, use non-skid floor wax.  Do not have  throw rugs and other things on the floor that can make you trip. What can I do with my stairs?  Do not leave any items on the stairs.  Make sure that there are handrails on both sides of the stairs and use them. Fix handrails that are broken or loose. Make sure that handrails are as long as the stairways.  Check any carpeting to make sure that it is firmly attached to the stairs. Fix any carpet that is loose or worn.  Avoid having throw rugs at the top or bottom of the stairs. If you do have throw rugs, attach them to the floor with carpet tape.  Make sure that you have a light switch at the top of the stairs and the bottom of the stairs. If you do not have them, ask someone to add them for you. What else can I do to help prevent falls?  Wear shoes that:  Do not have high heels.  Have rubber bottoms.  Are comfortable and fit you well.  Are closed at the toe. Do not wear  sandals.  If you use a stepladder:  Make sure that it is fully opened. Do not climb a closed stepladder.  Make sure that both sides of the stepladder are locked into place.  Ask someone to hold it for you, if possible.  Clearly mark and make sure that you can see:  Any grab bars or handrails.  First and last steps.  Where the edge of each step is.  Use tools that help you move around (mobility aids) if they are needed. These include:  Canes.  Walkers.  Scooters.  Crutches.  Turn on the lights when you go into a dark area. Replace any light bulbs as soon as they burn out.  Set up your furniture so you have a clear path. Avoid moving your furniture around.  If any of your floors are uneven, fix them.  If there are any pets around you, be aware of where they are.  Review your medicines with your doctor. Some medicines can make you feel dizzy. This can increase your chance of falling. Ask your doctor what other things that you can do to help prevent falls. This information is not intended to replace advice given to you by your health care provider. Make sure you discuss any questions you have with your health care provider. Document Released: 07/03/2009 Document Revised: 02/12/2016 Document Reviewed: 10/11/2014 Elsevier Interactive Patient Education  2017 [employer].

## 2019-06-27 NOTE — Progress Notes (Signed)
Subjective:   Carol Gay is a 73 y.o. female who presents for Medicare Annual (Subsequent) preventive examination.  Virtual Visit via Telephone Note  I connected with Carol Gay on 06/27/19 at  1:20 PM EDT by telephone and verified that I am speaking with the correct person using two identifiers.  Medicare Annual Wellness visit completed telephonically due to Covid-19 pandemic.   Location: Patient: home Provider: office   I discussed the limitations, risks, security and privacy concerns of performing an evaluation and management service by telephone and the availability of in person appointments. The patient expressed understanding and agreed to proceed.  Some vital signs may be absent or patient reported.   Clemetine Marker, LPN     Review of Systems:   Cardiac Risk Factors include: advanced age (>79men, >14 women);hypertension;dyslipidemia;obesity (BMI >30kg/m2)     Objective:     Vitals: Pulse 83   Ht 5\' 2"  (1.575 m)   Wt 166 lb (75.3 kg)   SpO2 (!) 87% Comment: pt on 4L continuous O2  BMI 30.36 kg/m   Body mass index is 30.36 kg/m.  Advanced Directives 06/27/2019 09/06/2018 07/13/2018 06/07/2018 12/28/2017 10/05/2017 09/07/2017  Does Patient Have a Medical Advance Directive? Yes Yes Yes Yes No Yes Yes  Type of Paramedic of [place];Living will Living will [place];Living will Living will;Healthcare Power of [place];Living will [place]  Does patient want to make changes to medical advance directive? - - - No - Patient declined - No - Patient declined -  Copy of [place] in Chart? No - copy requested - No - copy requested No - copy requested - - No - copy requested  Would patient like information on creating a medical advance directive? - - - - No - Patient declined No - Patient declined -  Pre-existing out of facility DNR order (yellow form or  pink MOST form) - - - - - - -    Tobacco Social History   Tobacco Use  Smoking Status Former Smoker  . Packs/day: 1.00  . Years: 57.00  . Pack years: 57.00  . Types: Cigarettes  . Quit date: 03/30/2016  . Years since quitting: 3.2  Smokeless Tobacco Never Used  Tobacco Comment   Quit in July 2017     Counseling given: Not Answered Comment: Quit in July 2017   Clinical Intake:  Pre-visit preparation completed: Yes  Pain : 0-10 Pain Score: 4  Pain Type: Chronic pain Pain Location: Chest(related to breathing problems) Pain Descriptors / Indicators: Discomfort, Sore     BMI - recorded: 30.36 Nutritional Status: BMI > 30  Obese Nutritional Risks: None Diabetes: No  How often do you need to have someone help you when you read instructions, pamphlets, or other written materials from your doctor or pharmacy?: 1 - Never  Interpreter Needed?: No  Information entered by :: Clemetine Marker LPN  Past Medical History:  Diagnosis Date  . Aortic atherosclerosis ([place])   . Arm pain   . Asthma   . CAD in native artery   . Cancer ([place]) 09/2015   rectum  . Cigarette smoker   . Claustrophobia   . Colon cancer ([place])   . 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 COPD (chronic obstructive pulmonary disease) ([place])   . DDD (degenerative disc disease), lumbar       . Dermatophytoses   .  Dyslipidemia   . H/O blood clots    left leg, veins stripped  . Hemorrhoids   . Hypertension   . Last menstrual period (LMP) > 10 days ago 1994  . Myocardial infarction [facility])    "mild" - age 31  . Personal history of chemotherapy   . Personal history of radiation therapy   . Pulmonary emphysema ([place])   . Rectal bleeding   . Shortness of breath dyspnea   . Smokers' cough ([place])   . Spinal headache    with one of my children's birth  . Vertigo   . Wears dentures    has full upper and lower - doesn't wear   Past Surgical History:  Procedure Laterality Date  .  APPENDECTOMY    . BREAST BIOPSY Left 1970's   neg  . COLONOSCOPY WITH PROPOFOL N/A 11/06/2015   Procedure: COLONOSCOPY WITH PROPOFOL;  Surgeon: Lucilla Lame, MD;  Location: [place];  Service: Endoscopy;  Laterality: N/A;  LEAVE PT EARLY  . EXPLORATORY LAPAROTOMY    . LESION EXCISION  11/03/15   Anal - Dr Dahlia Byes, [facility]  . LESION REMOVAL  1970's   from tailbone  . [place] SURGERY  1997  . OVARIAN CYST REMOVAL Left   . POLYPECTOMY  11/06/2015   Procedure: POLYPECTOMY;  Surgeon: Lucilla Lame, MD;  Location: [place];  Service: Endoscopy;;  . PORTACATH PLACEMENT N/A 12/10/2015   Procedure: INSERTION PORT-A-CATH;  Surgeon: Jules Husbands, MD;  Location: [facility];  Service: General;  Laterality: N/A;  . RECTAL EXAM UNDER ANESTHESIA N/A 06/23/2016   Procedure: RECTAL EXAM UNDER ANESTHESIA;  Surgeon: Jules Husbands, MD;  Location: [facility];  Service: General;  Laterality: N/A;  . VEIN LIGATION AND STRIPPING     Family History  Problem Relation Age of Onset  . Cancer Mother        lymphoma  . Heart disease Mother   . Cancer Maternal Grandmother        breast  . Birth defects Maternal Grandmother    Social History   Socioeconomic History  . Marital status: Widowed    Spouse name: Not on file  . Number of children: Not on file  . Years of education: Not on file  . Highest education level: Not on file  Occupational History  . Not on file  Social Needs  . Financial resource strain: Not very hard  . Food insecurity    Worry: Never true    Inability: Never true  . Transportation needs    Medical: No    Non-medical: No  Tobacco Use  . Smoking status: Former Smoker    Packs/day: 1.00    Years: 57.00    Pack years: 57.00    Types: Cigarettes    Quit date: 03/30/2016    Years since quitting: 3.2  . Smokeless tobacco: Never Used  . Tobacco comment: Quit in July 2017  Substance and Sexual Activity  . Alcohol use: No    Alcohol/week: 0.0 standard drinks  . Drug  use: No  . Sexual activity: Not Currently  Lifestyle  . Physical activity    Days per week: 0 days    Minutes per session: 0 min  . Stress: To some extent  Relationships  . Social Herbalist on phone: Patient refused    Gets together: Patient refused    Attends religious service: Patient refused    Active member of club or organization: Patient  refused    Attends meetings of clubs or organizations: Patient refused    Relationship status: Widowed  Other Topics Concern  . Not on file  Social History Narrative  . Not on file    Outpatient Encounter Medications as of 06/27/2019  Medication Sig  . acetaminophen (TYLENOL) 500 MG tablet Take 1,000 mg by mouth every 6 (six) hours as needed.  Marland Kitchen albuterol (PROVENTIL HFA;VENTOLIN HFA) 108 (90 Base) MCG/ACT inhaler INHALE 2 PUFFS INTO THE LUNGS EVERY 6 HOURS AS NEEDED FOR WHEEZING OR SHORTNESS OF BREATH  . amLODipine (NORVASC) 5 MG tablet TAKE 1 TABLET EVERY DAY  . atorvastatin (LIPITOR) 20 MG tablet TAKE 1 TABLET AT BEDTIME  . hydrOXYzine (ATARAX/VISTARIL) 25 MG tablet Take 1 tablet (25 mg total) by mouth 2 (two) times daily as needed.  Marland Kitchen ipratropium-albuterol (DUONEB) 0.5-2.5 (3) MG/3ML SOLN Take 3 mLs by nebulization 3 (three) times daily.  . Magnesium 250 MG TABS Take by mouth.  . OXYGEN Inhale 4 L/min into the lungs continuous.   . Potassium 99 MG TABS Take by mouth.  . TRELEGY ELLIPTA 100-62.5-25 MCG/INH AEPB INHALE 1 PUFF INTO THE LUNGS DAILY.  . Multiple Vitamins-Minerals (CENTRUM SILVER PO) Take 1 tablet by mouth daily.   Facility-Administered Encounter Medications as of 06/27/2019  Medication  . albuterol (PROVENTIL) (2.5 MG/3ML) 0.083% nebulizer solution 2.5 mg    Activities of Daily Living In your present state of health, do you have any difficulty performing the following activities: 06/27/2019 09/06/2018  Hearing? N N  Comment declines hearing aids -  Vision? N N  Difficulty concentrating or making decisions? N N   Walking or climbing stairs? N Y  Dressing or bathing? N N  Doing errands, shopping? N N  Preparing Food and eating ? N -  Using the Toilet? N -  In the past six months, have you accidently leaked urine? Y -  Comment wears pads for protection -  Do you have problems with loss of bowel control? N -  Managing your Medications? N -  Managing your Finances? N -  Housekeeping or managing your Housekeeping? N -  Some recent data might be hidden    Patient Care Team: Glean Hess, MD as PCP - General (Internal Medicine) Clent Jacks, RN as Registered Nurse Lucilla Lame, MD as Consulting Physician (Gastroenterology) Lloyd Huger, MD as Consulting Physician (Oncology) Noreene Filbert, MD as Referring Physician (Radiation Oncology) Laverle Hobby, MD (Inactive) as Consulting Physician (Pulmonary Disease)    Assessment:   This is a routine wellness examination for [place].  Exercise Activities and Dietary recommendations Current Exercise Habits: The patient does not participate in regular exercise at present, Exercise limited by: respiratory conditions(s)  Goals    . "I need help with my breathing" (pt-stated)    . Increase water intake     Recommend drinking at least 5-6 glasses of water a day       Fall Risk Fall Risk  06/27/2019 09/06/2018 03/31/2018 12/28/2017 05/16/2017  Falls in the past year? 0 0 No No No  Number falls in past yr: 0 - - - -  Injury with Fall? 0 - - - -  Risk for fall due to : Impaired balance/gait;Impaired mobility - Impaired balance/gait;Impaired mobility Impaired mobility -  Risk for fall due to: Comment - - - secondary to fatigue -  Follow up Falls prevention discussed - - - -   FALL RISK PREVENTION PERTAINING TO THE HOME:  Any stairs in  or around the home? Yes  If so, do they handrails? Yes   Home free of loose throw rugs in walkways, pet beds, electrical cords, etc? Yes  Adequate lighting in your home to reduce risk of falls?  Yes   ASSISTIVE DEVICES UTILIZED TO PREVENT FALLS:  Life alert? No  Use of a cane, walker or w/c? Yes  Grab bars in the bathroom? No  Shower chair or bench in shower? Yes  Elevated toilet seat or a handicapped toilet? No   DME ORDERS:  DME order needed?  No   TIMED UP AND GO:  Was the test performed? No . Telephonic visit.   Education: Fall risk prevention has been discussed.  Intervention(s) required? No   Depression Screen PHQ 2/9 Scores 06/27/2019 09/06/2018 03/31/2018 12/28/2017  PHQ - 2 Score 2 1 1 1   PHQ- 9 Score 6 - - -     Cognitive Function     6CIT Screen 06/27/2019 05/16/2017  What Year? 0 points 0 points  What month? 0 points 0 points  What time? 0 points 0 points  Count back from 20 0 points 0 points  Months in reverse 0 points 0 points  Repeat phrase 0 points 0 points  Total Score 0 0    Immunization History  Administered Date(s) Administered  . Pneumococcal Conjugate-13 10/29/2014  . Pneumococcal Polysaccharide-23 07/22/2010  . Tdap 09/02/2015    Qualifies for Shingles Vaccine? Yes  . Due for Shingrix. Education has been provided regarding the importance of this vaccine. Pt has been advised to call insurance company to determine out of pocket expense. Advised may also receive vaccine at local pharmacy or Health Dept. Verbalized acceptance and understanding.  Tdap: Up to date  Flu Vaccine: Due for Flu vaccine. Does the patient want to receive this vaccine today?  No . Education has been provided regarding the importance of this vaccine but still declined. Advised may receive this vaccine at local pharmacy or Health Dept. Aware to provide a copy of the vaccination record if obtained from local pharmacy or Health Dept. Verbalized acceptance and understanding.  Pneumococcal Vaccine: Up to date   Screening Tests Health Maintenance  Topic Date Due  . MAMMOGRAM  08/22/2018  . INFLUENZA VACCINE  09/22/2023 (Originally 04/21/2019)  . TETANUS/TDAP   09/01/2025  . COLONOSCOPY  11/05/2025  . DEXA SCAN  Completed  . Hepatitis C Screening  Completed  . PNA vac Low Risk Adult  Completed   Cancer Screenings:  Colorectal Screening: Completed 11/06/15. Repeat every 10 years;   Mammogram: Completed 08/22/17. Repeat every year. Declines repeat screening at this time.   Bone Density: Completed 08/22/17. Results reflect OSTEOPOROSIS. Repeat every 2 years. Declines repeat screening at this time.   Lung Cancer Screening: (Low Dose CT Chest recommended if Age 31-80 years, 30 pack-year currently smoking OR have quit w/in 15years.) DOES qualify. Followed by pulmonology and oncology.    Additional Screening:  Hepatitis C Screening: does qualify; Completed 09/02/15  Vision Screening: Recommended annual ophthalmology exams for early detection of glaucoma and other disorders of the eye. Is the patient up to date with their annual eye exam?  Yes  Who is the provider or what is the name of the office in which the pt attends annual eye exams? Dr. Matilde Sprang  Dental Screening: Recommended annual dental exams for proper oral hygiene  Community Resource Referral:  CRR required this visit?  No      Plan:     I have personally  reviewed and addressed the Medicare Annual Wellness questionnaire and have noted the following in the patient's chart:  A. Medical and social history B. Use of alcohol, tobacco or illicit drugs  C. Current medications and supplements D. Functional ability and status E.  Nutritional status F.  Physical activity G. Advance directives H. List of other physicians I.  Hospitalizations, surgeries, and ER visits in previous 12 months J.  [place] such as hearing and vision if needed, cognitive and depression L. Referrals and appointments   In addition, I have reviewed and discussed with patient certain preventive protocols, quality metrics, and best practice recommendations. A written personalized care plan for preventive  services as well as general preventive health recommendations were provided to patient.   Signed,  Clemetine Marker, LPN Nurse Health Advisor   Nurse Notes: pt states she has used CBD oil drops 4 times and she feels it has helped with her breathing. Pt wanted to notify Dr. Army Melia, advised to discuss at visit next month.

## 2019-07-02 DIAGNOSIS — J449 Chronic obstructive pulmonary disease, unspecified: Secondary | ICD-10-CM | POA: Diagnosis not present

## 2019-07-02 DIAGNOSIS — J441 Chronic obstructive pulmonary disease with (acute) exacerbation: Secondary | ICD-10-CM | POA: Diagnosis not present

## 2019-07-16 ENCOUNTER — Other Ambulatory Visit: Payer: Self-pay | Admitting: Internal Medicine

## 2019-07-18 ENCOUNTER — Ambulatory Visit
Admission: RE | Admit: 2019-07-18 | Discharge: 2019-07-18 | Disposition: A | Discharge status: Home / Self Care | Payer: Medicare HMO | Source: Ambulatory Visit | Attending: Radiation Oncology | Admitting: Radiation Oncology

## 2019-08-02 DIAGNOSIS — J449 Chronic obstructive pulmonary disease, unspecified: Secondary | ICD-10-CM | POA: Diagnosis not present

## 2019-08-02 DIAGNOSIS — J441 Chronic obstructive pulmonary disease with (acute) exacerbation: Secondary | ICD-10-CM | POA: Diagnosis not present

## 2019-08-07 ENCOUNTER — Ambulatory Visit: Payer: Medicare HMO | Admitting: Internal Medicine

## 2019-11-12 ENCOUNTER — Other Ambulatory Visit: Payer: Self-pay | Admitting: Internal Medicine

## 2019-11-12 DIAGNOSIS — J439 Emphysema, unspecified: Secondary | ICD-10-CM

## 2019-12-07 DIAGNOSIS — J441 Chronic obstructive pulmonary disease with (acute) exacerbation: Secondary | ICD-10-CM | POA: Diagnosis not present

## 2019-12-07 DIAGNOSIS — J449 Chronic obstructive pulmonary disease, unspecified: Secondary | ICD-10-CM | POA: Diagnosis not present

## 2019-12-31 ENCOUNTER — Other Ambulatory Visit: Payer: Self-pay | Admitting: Internal Medicine

## 2020-01-01 NOTE — Telephone Encounter (Signed)
Could not leave a message, VM not set up

## 2020-01-08 ENCOUNTER — Encounter: Payer: Self-pay | Admitting: Internal Medicine

## 2020-01-08 NOTE — Telephone Encounter (Signed)
Mailed letter °

## 2020-01-16 ENCOUNTER — Telehealth: Payer: Self-pay | Admitting: Internal Medicine

## 2020-01-16 NOTE — Telephone Encounter (Signed)
Best contact: [phone] Hassell Done, from [facility], called to report irregular Oxygen levels with patient, with a slightly low heart rate.  Oxygen reported: between 78-86 HR: low of 86 BP: 162/80  [facility] specified that they are not responsible for fu with patient, so we will have to contact pt directly.

## 2020-01-28 ENCOUNTER — Other Ambulatory Visit: Payer: Self-pay

## 2020-01-28 ENCOUNTER — Ambulatory Visit: Payer: Medicare HMO | Admitting: Internal Medicine

## 2020-01-28 ENCOUNTER — Emergency Department: Payer: Medicare HMO

## 2020-01-28 ENCOUNTER — Inpatient Hospital Stay
Admission: EM | Admit: 2020-01-28 | Discharge: 2020-02-03 | DRG: 190 | Disposition: A | Discharge status: Home Health | Payer: Medicare HMO | Attending: Hospitalist | Admitting: Hospitalist

## 2020-01-28 DIAGNOSIS — Z20822 Contact with and (suspected) exposure to covid-19: Secondary | ICD-10-CM | POA: Diagnosis not present

## 2020-01-28 DIAGNOSIS — R252 Cramp and spasm: Secondary | ICD-10-CM | POA: Diagnosis not present

## 2020-01-28 DIAGNOSIS — Z79899 Other long term (current) drug therapy: Secondary | ICD-10-CM | POA: Diagnosis not present

## 2020-01-28 DIAGNOSIS — Z9981 Dependence on supplemental oxygen: Secondary | ICD-10-CM

## 2020-01-28 DIAGNOSIS — R7402 Elevation of levels of lactic acid dehydrogenase (LDH): Secondary | ICD-10-CM | POA: Diagnosis not present

## 2020-01-28 DIAGNOSIS — R0902 Hypoxemia: Secondary | ICD-10-CM

## 2020-01-28 DIAGNOSIS — Z9221 Personal history of antineoplastic chemotherapy: Secondary | ICD-10-CM | POA: Diagnosis not present

## 2020-01-28 DIAGNOSIS — J9621 Acute and chronic respiratory failure with hypoxia: Secondary | ICD-10-CM | POA: Diagnosis present

## 2020-01-28 DIAGNOSIS — R0602 Shortness of breath: Secondary | ICD-10-CM | POA: Diagnosis not present

## 2020-01-28 DIAGNOSIS — Z87891 Personal history of nicotine dependence: Secondary | ICD-10-CM | POA: Diagnosis not present

## 2020-01-28 DIAGNOSIS — Z86010 Personal history of colonic polyps: Secondary | ICD-10-CM

## 2020-01-28 DIAGNOSIS — I2510 Atherosclerotic heart disease of native coronary artery without angina pectoris: Secondary | ICD-10-CM | POA: Diagnosis present

## 2020-01-28 DIAGNOSIS — Z515 Encounter for palliative care: Secondary | ICD-10-CM | POA: Diagnosis not present

## 2020-01-28 DIAGNOSIS — I10 Essential (primary) hypertension: Secondary | ICD-10-CM | POA: Diagnosis present

## 2020-01-28 DIAGNOSIS — Q600 Renal agenesis, unilateral: Secondary | ICD-10-CM

## 2020-01-28 DIAGNOSIS — I700 Atherosclerosis of aorta: Secondary | ICD-10-CM | POA: Diagnosis not present

## 2020-01-28 DIAGNOSIS — I252 Old myocardial infarction: Secondary | ICD-10-CM | POA: Diagnosis not present

## 2020-01-28 DIAGNOSIS — M810 Age-related osteoporosis without current pathological fracture: Secondary | ICD-10-CM | POA: Diagnosis present

## 2020-01-28 DIAGNOSIS — E785 Hyperlipidemia, unspecified: Secondary | ICD-10-CM | POA: Diagnosis present

## 2020-01-28 DIAGNOSIS — Z886 Allergy status to analgesic agent status: Secondary | ICD-10-CM

## 2020-01-28 DIAGNOSIS — M5136 Other intervertebral disc degeneration, lumbar region: Secondary | ICD-10-CM | POA: Diagnosis present

## 2020-01-28 DIAGNOSIS — Z923 Personal history of irradiation: Secondary | ICD-10-CM | POA: Diagnosis not present

## 2020-01-28 DIAGNOSIS — R197 Diarrhea, unspecified: Secondary | ICD-10-CM | POA: Diagnosis not present

## 2020-01-28 DIAGNOSIS — Z85038 Personal history of other malignant neoplasm of large intestine: Secondary | ICD-10-CM | POA: Diagnosis not present

## 2020-01-28 DIAGNOSIS — Z885 Allergy status to narcotic agent status: Secondary | ICD-10-CM

## 2020-01-28 DIAGNOSIS — R0689 Other abnormalities of breathing: Secondary | ICD-10-CM | POA: Diagnosis not present

## 2020-01-28 DIAGNOSIS — Z66 Do not resuscitate: Secondary | ICD-10-CM | POA: Diagnosis present

## 2020-01-28 DIAGNOSIS — J441 Chronic obstructive pulmonary disease with (acute) exacerbation: Principal | ICD-10-CM | POA: Diagnosis present

## 2020-01-28 DIAGNOSIS — J439 Emphysema, unspecified: Secondary | ICD-10-CM

## 2020-01-28 DIAGNOSIS — F419 Anxiety disorder, unspecified: Secondary | ICD-10-CM | POA: Diagnosis present

## 2020-01-28 DIAGNOSIS — R7989 Other specified abnormal findings of blood chemistry: Secondary | ICD-10-CM

## 2020-01-28 DIAGNOSIS — Z7189 Other specified counseling: Secondary | ICD-10-CM | POA: Diagnosis not present

## 2020-01-28 DIAGNOSIS — J449 Chronic obstructive pulmonary disease, unspecified: Secondary | ICD-10-CM | POA: Diagnosis not present

## 2020-01-28 DIAGNOSIS — R069 Unspecified abnormalities of breathing: Secondary | ICD-10-CM | POA: Diagnosis not present

## 2020-01-28 LAB — CBC WITH DIFFERENTIAL/PLATELET
Abs Immature Granulocytes: 0.04 10*3/uL (ref 0.00–0.07)
Basophils Absolute: 0.1 10*3/uL (ref 0.0–0.1)
Basophils Relative: 1 %
Eosinophils Absolute: 0.1 10*3/uL (ref 0.0–0.5)
Eosinophils Relative: 1 %
HCT: 48.5 % — ABNORMAL HIGH (ref 36.0–46.0)
Hemoglobin: 15.7 g/dL — ABNORMAL HIGH (ref 12.0–15.0)
Immature Granulocytes: 0 %
Lymphocytes Relative: 21 %
Lymphs Abs: 2.1 10*3/uL (ref 0.7–4.0)
MCH: 29.2 pg (ref 26.0–34.0)
MCHC: 32.4 g/dL (ref 30.0–36.0)
MCV: 90.1 fL (ref 80.0–100.0)
Monocytes Absolute: 0.5 10*3/uL (ref 0.1–1.0)
Monocytes Relative: 5 %
Neutro Abs: 7.1 10*3/uL (ref 1.7–7.7)
Neutrophils Relative %: 72 %
Platelets: 260 10*3/uL (ref 150–400)
RBC: 5.38 MIL/uL — ABNORMAL HIGH (ref 3.87–5.11)
RDW: 15.2 % (ref 11.5–15.5)
WBC: 10 10*3/uL (ref 4.0–10.5)
nRBC: 0 % (ref 0.0–0.2)

## 2020-01-28 LAB — COMPREHENSIVE METABOLIC PANEL
ALT: 13 U/L (ref 0–44)
AST: 22 U/L (ref 15–41)
Albumin: 4.6 g/dL (ref 3.5–5.0)
Alkaline Phosphatase: 78 U/L (ref 38–126)
Anion gap: 9 (ref 5–15)
BUN: 16 mg/dL (ref 8–23)
CO2: 23 mmol/L (ref 22–32)
Calcium: 9.9 mg/dL (ref 8.9–10.3)
Chloride: 106 mmol/L (ref 98–111)
Creatinine, Ser: 0.67 mg/dL (ref 0.44–1.00)
GFR calc Af Amer: >60 mL/min (ref >60)
GFR calc non Af Amer: >60 mL/min (ref >60)
Glucose, Bld: 155 mg/dL — ABNORMAL HIGH (ref 70–99)
Potassium: 3.8 mmol/L (ref 3.5–5.1)
Sodium: 138 mmol/L (ref 135–145)
Total Bilirubin: 0.7 mg/dL (ref 0.3–1.2)
Total Protein: 9.1 g/dL — ABNORMAL HIGH (ref 6.5–8.1)

## 2020-01-28 LAB — RESPIRATORY PANEL BY RT PCR (FLU A&B, COVID)
Influenza A by PCR: NEGATIVE
Influenza B by PCR: NEGATIVE
SARS Coronavirus 2 by RT PCR: NEGATIVE

## 2020-01-28 LAB — LACTIC ACID, PLASMA
Lactic Acid, Venous: 2 mmol/L (ref 0.5–1.9)
Lactic Acid, Venous: 1.9 mmol/L (ref 0.5–1.9)

## 2020-01-28 LAB — TROPONIN I (HIGH SENSITIVITY): Troponin I (High Sensitivity): 16 ng/L (ref <18)

## 2020-01-28 MED ORDER — SODIUM CHLORIDE 0.9 % IV BOLUS (SEPSIS)
1000.0000 mL | Freq: Once | INTRAVENOUS | Status: AC
  Administered 2020-01-28: 1000 mL via INTRAVENOUS

## 2020-01-28 MED ORDER — LORAZEPAM 2 MG/ML IJ SOLN
0.5000 mg | Freq: Once | INTRAMUSCULAR | Status: AC
  Administered 2020-01-28: 0.5 mg via INTRAVENOUS
  Filled 2020-01-28: qty 1

## 2020-01-28 MED ORDER — HYDRALAZINE HCL 20 MG/ML IJ SOLN
5.0000 mg | INTRAMUSCULAR | Status: DC | PRN
  Administered 2020-01-30: 5 mg via INTRAVENOUS
  Filled 2020-01-28: qty 1

## 2020-01-28 MED ORDER — ACETAMINOPHEN 325 MG PO TABS
650.0000 mg | ORAL_TABLET | Freq: Four times a day (QID) | ORAL | Status: DC | PRN
  Administered 2020-01-28 – 2020-02-03 (×11): 650 mg via ORAL
  Filled 2020-01-28 (×11): qty 2

## 2020-01-28 MED ORDER — SODIUM CHLORIDE 0.9 % IV SOLN
500.0000 mg | INTRAVENOUS | Status: DC
  Administered 2020-01-28 – 2020-01-29 (×2): 500 mg via INTRAVENOUS
  Filled 2020-01-28 (×2): qty 500

## 2020-01-28 MED ORDER — IPRATROPIUM-ALBUTEROL 0.5-2.5 (3) MG/3ML IN SOLN
3.0000 mL | Freq: Once | RESPIRATORY_TRACT | Status: AC
  Administered 2020-01-28: 3 mL via RESPIRATORY_TRACT
  Filled 2020-01-28: qty 3

## 2020-01-28 MED ORDER — IPRATROPIUM-ALBUTEROL 0.5-2.5 (3) MG/3ML IN SOLN
3.0000 mL | RESPIRATORY_TRACT | Status: DC
  Administered 2020-01-28 – 2020-01-30 (×13): 3 mL via RESPIRATORY_TRACT
  Filled 2020-01-28 (×13): qty 3

## 2020-01-28 MED ORDER — SODIUM CHLORIDE 0.9 % IV BOLUS (SEPSIS)
500.0000 mL | Freq: Once | INTRAVENOUS | Status: AC
  Administered 2020-01-28: 500 mL via INTRAVENOUS

## 2020-01-28 MED ORDER — SODIUM CHLORIDE 0.9 % IV SOLN
2.0000 g | INTRAVENOUS | Status: DC
  Administered 2020-01-28 – 2020-01-30 (×3): 2 g via INTRAVENOUS
  Filled 2020-01-28: qty 20
  Filled 2020-01-28 (×2): qty 2

## 2020-01-28 MED ORDER — ATORVASTATIN CALCIUM 20 MG PO TABS
20.0000 mg | ORAL_TABLET | Freq: Every day | ORAL | Status: DC
  Administered 2020-01-28 – 2020-02-02 (×6): 20 mg via ORAL
  Filled 2020-01-28 (×6): qty 1

## 2020-01-28 MED ORDER — ALPRAZOLAM 0.5 MG PO TABS
0.5000 mg | ORAL_TABLET | Freq: Every evening | ORAL | Status: DC | PRN
  Administered 2020-01-28 – 2020-02-02 (×6): 0.5 mg via ORAL
  Filled 2020-01-28 (×6): qty 1

## 2020-01-28 MED ORDER — ENOXAPARIN SODIUM 40 MG/0.4ML ~~LOC~~ SOLN
40.0000 mg | SUBCUTANEOUS | Status: DC
  Administered 2020-01-29 – 2020-02-02 (×5): 40 mg via SUBCUTANEOUS
  Filled 2020-01-28 (×5): qty 0.4

## 2020-01-28 MED ORDER — METHYLPREDNISOLONE SODIUM SUCC 40 MG IJ SOLR
40.0000 mg | Freq: Two times a day (BID) | INTRAMUSCULAR | Status: DC
  Administered 2020-01-28 – 2020-02-01 (×8): 40 mg via INTRAVENOUS
  Filled 2020-01-28 (×8): qty 1

## 2020-01-28 MED ORDER — HYDROXYZINE HCL 25 MG PO TABS
25.0000 mg | ORAL_TABLET | Freq: Two times a day (BID) | ORAL | Status: DC | PRN
  Administered 2020-01-28 – 2020-02-03 (×4): 25 mg via ORAL
  Filled 2020-01-28 (×4): qty 1

## 2020-01-28 MED ORDER — DM-GUAIFENESIN ER 30-600 MG PO TB12
1.0000 | ORAL_TABLET | Freq: Two times a day (BID) | ORAL | Status: DC | PRN
  Administered 2020-01-30 – 2020-02-02 (×5): 1 via ORAL
  Filled 2020-01-28 (×5): qty 1

## 2020-01-28 MED ORDER — ALBUTEROL SULFATE (2.5 MG/3ML) 0.083% IN NEBU: 2.5000 mg | INHALATION_SOLUTION | RESPIRATORY_TRACT | Status: DC | PRN

## 2020-01-28 MED ORDER — ONDANSETRON HCL 4 MG/2ML IJ SOLN
4.0000 mg | Freq: Three times a day (TID) | INTRAMUSCULAR | Status: DC | PRN
  Administered 2020-02-02: 4 mg via INTRAVENOUS
  Filled 2020-01-28: qty 2

## 2020-01-28 NOTE — Progress Notes (Signed)
CODE SEPSIS - PHARMACY COMMUNICATION  **Broad Spectrum Antibiotics should be administered within 1 hour of Sepsis diagnosis**  Time Code Sepsis Called/Page Received: 7841  Antibiotics Ordered: ceftriaxone/azithro  Time of 1st antibiotic administration: 0732  Additional action taken by pharmacy:   If necessary, Name of Provider/Nurse Contacted:     Tobie Lords ,PharmD Clinical Pharmacist  01/28/2020  7:39 AM

## 2020-01-28 NOTE — ED Notes (Signed)
Respiratory at bedside. Pt placed on bipap. ABG drawn.

## 2020-01-28 NOTE — ED Notes (Signed)
Respiratory called in regards to pt's oxygen saturation being 88% on bipap while pt asleep. When pt awakes, oxygen saturation increases to 94%. Pt appears to be in NAD at this time. Pt unable to tolerate being off of bipap at this time.

## 2020-01-28 NOTE — ED Triage Notes (Signed)
PT arrives [facility] from home w shob. Sats were 70% on her 3L chronic. EMS put on 4L, given one breathing treatment, 125 solumedrol, pt used inhaler before calling EMS. Pt smoker w chronic cough.  Pt sats 65% on 4L here.

## 2020-01-28 NOTE — ED Notes (Signed)
Pt tolerating bipap well at this time. Pt is a/ox4 at present. Repositioned for comfort. ST noted on the monitor. will continue to monitor the pt.

## 2020-01-28 NOTE — ED Notes (Signed)
Pt taken off BiPAP per RT recommendations, pt did not tolerate, tachypnic, O2 sats dropped below 70 on 4L [place]. Pt placed back on BiPAP and is comfortable at this time. Will continue to monitor the pt.

## 2020-01-28 NOTE — H&P (Signed)
History and Physical    Carol Gay:[id number] DOB: 05/25/1946 DOA: 01/28/2020  Referring MD/NP/PA:   PCP: Glean Hess, MD   Patient coming from:  The patient is coming from home.  At baseline, pt is independent for most of ADL.        Chief Complaint: SOB  HPI: Carol Gay is a 74 y.o. female with medical history significant of COPD on 3 L oxygen, hypertension, hyperlipidemia, asthma, CAD, solitary kidney (congenital absence of left kidney), anxiety, colon cancer, aortic stenosis, former smoker, who presents with shortness of breath.  Patient states that her shortness breath has been worsening since yesterday. She has cough, wheezing, progressively worsening shortness of breath.  She has fatigue.  No fever or chills.  Patient does not have chest pain, but reports left-sided chest discomfort.  Denies nausea, vomiting, diarrhea, abdominal pain, symptoms of UTI or unilateral weakness.  Patient was found to have oxygen desaturation to 70s on home level 3 L nasal cannula oxygen.  BiPAP is started in ED.  ED Course: pt was found to have WBC 10.0, lactic acid 2.0, troponin 16, negative COVID-19 PCR, electrolytes renal function okay, temperature normal, blood pressure 114/66, heart rate 101, tachypnea.  Chest x-ray showed COPD, with hazy appearance at bilateral base.  ABG with pH 7.40, CO2 39, O2 315. Patient is admitted to progressive bed as inpatient.  Review of Systems:   General: no fevers, chills, no body weight gain, has fatigue HEENT: no blurry vision, hearing changes or sore throat Respiratory: has dyspnea, coughing, wheezing CV: has left-sided chest discomfort, no palpitations GI: no nausea, vomiting, abdominal pain, diarrhea, constipation GU: no dysuria, burning on urination, increased urinary frequency, hematuria  Ext: has trace leg edema Neuro: no unilateral weakness, numbness, or tingling, no vision change or hearing loss Skin: no rash, no skin tear. MSK: No  muscle spasm, no deformity, no limitation of range of movement in spin Heme: No easy bruising.  Travel history: No recent long distant travel.  Allergy:  Allergies  Allergen Reactions  . Aspirin Nausea And Vomiting  . Codeine Nausea And Vomiting  . Morphine And Related Nausea And Vomiting    Past Medical History:  Diagnosis Date  . Aortic atherosclerosis ([place])   . Arm pain   . Asthma   . CAD in native artery   . Cancer ([place]) 09/2015   rectum  . Cigarette smoker   . Claustrophobia   . Colon cancer ([place])   . 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 COPD (chronic obstructive pulmonary disease) ([place])   . DDD (degenerative disc disease), lumbar       . Dermatophytoses   . Dyslipidemia   . H/O blood clots    left leg, veins stripped  . Hemorrhoids   . Hypertension   . Last menstrual period (LMP) > 10 days ago 1994  . Myocardial infarction [facility])    "mild" - age 42  . Personal history of chemotherapy   . Personal history of radiation therapy   . Pulmonary emphysema ([place])   . Rectal bleeding   . Shortness of breath dyspnea   . Smokers' cough ([place])   . Spinal headache    with one of my children's birth  . Vertigo   . Wears dentures    has full upper and lower - doesn't wear    Past Surgical History:  Procedure Laterality Date  . APPENDECTOMY    .  BREAST BIOPSY Left 1970's   neg  . COLONOSCOPY WITH PROPOFOL N/A 11/06/2015   Procedure: COLONOSCOPY WITH PROPOFOL;  Surgeon: Lucilla Lame, MD;  Location: [facility];  Service: Endoscopy;  Laterality: N/A;  LEAVE PT EARLY  . EXPLORATORY LAPAROTOMY    . LESION EXCISION  11/03/15   Anal - Dr Dahlia Byes, [facility]  . LESION REMOVAL  1970's   from tailbone  . [place] SURGERY  1997  . OVARIAN CYST REMOVAL Left   . POLYPECTOMY  11/06/2015   Procedure: POLYPECTOMY;  Surgeon: Lucilla Lame, MD;  Location: [facility];  Service: Endoscopy;;  . PORTACATH PLACEMENT N/A 12/10/2015   Procedure:  INSERTION PORT-A-CATH;  Surgeon: Jules Husbands, MD;  Location: [facility];  Service: General;  Laterality: N/A;  . RECTAL EXAM UNDER ANESTHESIA N/A 06/23/2016   Procedure: RECTAL EXAM UNDER ANESTHESIA;  Surgeon: Jules Husbands, MD;  Location: [facility];  Service: General;  Laterality: N/A;  . VEIN LIGATION AND STRIPPING      Social History:  reports that she quit smoking about 3 years ago. Her smoking use included cigarettes. She has a 57.00 pack-year smoking history. She has never used smokeless tobacco. She reports that she does not drink alcohol or use drugs.  Family History:  Family History  Problem Relation Age of Onset  . Cancer Mother        lymphoma  . Heart disease Mother   . Cancer Maternal Grandmother        breast  . Birth defects Maternal Grandmother      Prior to Admission medications   Medication Sig Start Date End Date Taking? Authorizing Provider  acetaminophen (TYLENOL) 500 MG tablet Take 1,000 mg by mouth every 6 (six) hours as needed.    [provider]  albuterol (PROVENTIL HFA;VENTOLIN HFA) 108 (90 Base) MCG/ACT inhaler INHALE 2 PUFFS INTO THE LUNGS EVERY 6 HOURS AS NEEDED FOR WHEEZING OR SHORTNESS OF BREATH 01/17/18   Glean Hess, MD  amLODipine (NORVASC) 5 MG tablet TAKE 1 TABLET EVERY DAY 07/16/19   Glean Hess, MD  atorvastatin (LIPITOR) 20 MG tablet TAKE 1 TABLET AT BEDTIME 12/31/19   Glean Hess, MD  hydrOXYzine (ATARAX/VISTARIL) 25 MG tablet TAKE 1 TABLET TWICE DAILY AS NEEDED 11/12/19   Glean Hess, MD  ipratropium-albuterol (DUONEB) 0.5-2.5 (3) MG/3ML SOLN Take 3 mLs by nebulization 3 (three) times daily. 12/30/17   Laverle Hobby, MD  Magnesium 250 MG TABS Take by mouth.    [provider]  Multiple Vitamins-Minerals (CENTRUM SILVER PO) Take 1 tablet by mouth daily.    [provider]  OXYGEN Inhale 4 L/min into the lungs continuous.     [provider]  Potassium 99 MG TABS Take by mouth.     [provider]  TRELEGY ELLIPTA 100-62.5-25 MCG/INH AEPB INHALE 1 PUFF INTO THE LUNGS DAILY. 11/12/19   Glean Hess, MD    Physical Exam: Vitals:   01/28/20 0700 01/28/20 0715 01/28/20 0745 01/28/20 0800  BP: 114/66   100/64  Pulse: 98 (!) 101 88 89  Resp: 20 (!) 39 19   Temp:      TempSrc:      SpO2: 98% 95% 99% 96%  Weight:      Height:       General: has acute respiratory distress HEENT:       Eyes: PERRL, EOMI, no scleral icterus.       ENT: No discharge from  the ears and nose, no pharynx injection, no tonsillar enlargement.        Neck: No JVD, no bruit, no mass felt. Heme: No neck lymph node enlargement. Cardiac: S1/S2, RRR, No murmurs, No gallops or rubs. Respiratory: Has wheezing bilaterally GI: Soft, nondistended, nontender, no rebound pain, no organomegaly, BS present. GU: No hematuria Ext:  Has trace leg edema bilaterally. 1+DP/PT pulse bilaterally. Musculoskeletal: No joint deformities, No joint redness or warmth, no limitation of ROM in spin. Skin: No rashes.  Neuro: Alert, oriented X3, cranial nerves II-XII grossly intact, moves all extremities normally Psych: Patient is not psychotic, no suicidal or hemocidal ideation.  Labs on Admission: I have personally reviewed following labs and imaging studies  CBC: Recent Labs  Lab 01/28/20 0602  WBC 10.0  NEUTROABS 7.1  HGB 15.7*  HCT 48.5*  MCV 90.1  PLT 188   Basic Metabolic Panel: Recent Labs  Lab 01/28/20 0602  NA 138  K 3.8  CL 106  CO2 23  GLUCOSE 155*  BUN 16  CREATININE 0.67  CALCIUM 9.9   GFR: Estimated Creatinine Clearance: 60.4 mL/min (by C-G formula based on SCr of 0.67 mg/dL). Liver Function Tests: Recent Labs  Lab 01/28/20 0602  AST 22  ALT 13  ALKPHOS 78  BILITOT 0.7  PROT 9.1*  ALBUMIN 4.6   No results for input(s): LIPASE, AMYLASE in the last 168 hours. No results for input(s): AMMONIA in the last 168 hours. Coagulation Profile: No results for input(s):  INR, PROTIME in the last 168 hours. Cardiac Enzymes: No results for input(s): CKTOTAL, CKMB, CKMBINDEX, TROPONINI in the last 168 hours. BNP (last 3 results) No results for input(s): PROBNP in the last 8760 hours. HbA1C: No results for input(s): HGBA1C in the last 72 hours. CBG: No results for input(s): GLUCAP in the last 168 hours. Lipid Profile: No results for input(s): CHOL, HDL, LDLCALC, TRIG, CHOLHDL, LDLDIRECT in the last 72 hours. Thyroid Function Tests: No results for input(s): TSH, T4TOTAL, FREET4, T3FREE, THYROIDAB in the last 72 hours. Anemia Panel: No results for input(s): VITAMINB12, FOLATE, FERRITIN, TIBC, IRON, RETICCTPCT in the last 72 hours. Urine analysis:    Component Value Date/Time   COLORURINE AMBER (A) 02/08/2016 0555   APPEARANCEUR CLEAR (A) 02/08/2016 0555   LABSPEC 1.031 (H) 02/08/2016 0555   PHURINE 5.0 02/08/2016 0555   GLUCOSEU NEGATIVE 02/08/2016 0555   HGBUR 3+ (A) 02/08/2016 0555   BILIRUBINUR NEGATIVE 02/08/2016 0555   BILIRUBINUR neg 09/02/2015 1116   KETONESUR TRACE (A) 02/08/2016 0555   PROTEINUR 100 (A) 02/08/2016 0555   UROBILINOGEN 0.2 09/02/2015 1116   NITRITE NEGATIVE 02/08/2016 0555   LEUKOCYTESUR NEGATIVE 02/08/2016 0555   Sepsis Labs: @LABRCNTIP (procalcitonin:4,lacticidven:4) ) Recent Results (from the past 240 hour(s))  Respiratory Panel by RT PCR (Flu A&B, Covid) - Nasopharyngeal Swab     Status: None   Collection Time: 01/28/20  5:57 AM   Specimen: Nasopharyngeal Swab  Result Value Ref Range Status   SARS Coronavirus 2 by RT PCR NEGATIVE NEGATIVE Final    Comment: (NOTE) SARS-CoV-2 target nucleic acids are NOT DETECTED. The SARS-CoV-2 RNA is generally detectable in upper respiratoy specimens during the acute phase of infection. The lowest concentration of SARS-CoV-2 viral copies this assay can detect is 131 copies/mL. A negative result does not preclude SARS-Cov-2 infection and should not be used as the sole basis for  treatment or other patient management decisions. A negative result may occur with  improper specimen collection/handling, submission of specimen  other than nasopharyngeal swab, presence of viral mutation(s) within the areas targeted by this assay, and inadequate number of viral copies (<131 copies/mL). A negative result must be combined with clinical observations, patient history, and epidemiological information. The expected result is Negative. Fact Sheet for Patients:  PinkCheek.be Fact Sheet for Healthcare Providers:  GravelBags.it This test is not yet ap proved or cleared by the [place] FDA and  has been authorized for detection and/or diagnosis of SARS-CoV-2 by FDA under an Emergency Use Authorization (EUA). This EUA will remain  in effect (meaning this test can be used) for the duration of the COVID-19 declaration under Section 564(b)(1) of the Act, 21 U.S.C. section 360bbb-3(b)(1), unless the authorization is terminated or revoked sooner.    Influenza A by PCR NEGATIVE NEGATIVE Final   Influenza B by PCR NEGATIVE NEGATIVE Final    Comment: (NOTE) The Xpert Xpress SARS-CoV-2/FLU/RSV assay is intended as an aid in  the diagnosis of influenza from Nasopharyngeal swab specimens and  should not be used as a sole basis for treatment. Nasal washings and  aspirates are unacceptable for Xpert Xpress SARS-CoV-2/FLU/RSV  testing. Fact Sheet for Patients: PinkCheek.be Fact Sheet for Healthcare Providers: GravelBags.it This test is not yet approved or cleared by the [place] FDA and  has been authorized for detection and/or diagnosis of SARS-CoV-2 by  FDA under an Emergency Use Authorization (EUA). This EUA will remain  in effect (meaning this test can be used) for the duration of the  Covid-19 declaration under Section 564(b)(1) of the Act, 21  U.S.C. section  360bbb-3(b)(1), unless the authorization is  terminated or revoked. Performed at [facility], [address] [place], [place] [address]   Culture, blood (routine x 2)     Status: None (Preliminary result)   Collection Time: 01/28/20  5:58 AM   Specimen: BLOOD  Result Value Ref Range Status   Specimen Description BLOOD LEFT ARM  Final   Special Requests   Final    BOTTLES DRAWN AEROBIC AND ANAEROBIC Blood Culture adequate volume   Culture   Final    NO GROWTH <12 HOURS Performed at [facility], [address], [place], [place] [address]    Report Status PENDING  Incomplete  Culture, blood (routine x 2)     Status: None (Preliminary result)   Collection Time: 01/28/20  5:58 AM   Specimen: BLOOD  Result Value Ref Range Status   Specimen Description BLOOD RIGHT HAND  Final   Special Requests   Final    BOTTLES DRAWN AEROBIC AND ANAEROBIC Blood Culture adequate volume   Culture   Final    NO GROWTH <12 HOURS Performed at [facility], [address] [place], [place] [address]    Report Status PENDING  Incomplete     Radiological Exams on Admission: DG Chest Port 1 View  Result Date: 01/28/2020 CLINICAL DATA:  Shortness of breath. EXAM: PORTABLE CHEST 1 VIEW COMPARISON:  Chest x-ray dated July 25, 2018. FINDINGS: The patient is rotated to the right. Unchanged right chest wall port catheter. The heart size and mediastinal contours are within normal limits. Normal pulmonary vascularity. The lungs remain hyperinflated with emphysematous changes. Bilateral lower lobe coarse interstitial and peribronchial thickening. Chronic subpleural reticulation the right upper lobe. No focal consolidation, pleural effusion, or pneumothorax. No acute osseous abnormality. IMPRESSION: COPD. Electronically Signed   By: Titus Dubin M.D.   On: 01/28/2020 07:01     EKG: Not done in ED,  will get one.   Assessment/Plan Principal Problem:   COPD exacerbation  (HCC) Active Problems:   CAD in native artery   Dyslipidemia   Essential (primary) hypertension   Acute on chronic respiratory failure with hypoxia (HCC)   Anxiety   Acute on chronic respiratory failure with hypoxia due to COPD exacerbation [facility]): Patient has a cough, shortness of breath, wheezing, clinically consistent with COPD exacerbation.  Chest x-ray showed questionable hazy airspace disease at bilateral base.  Given her severe history of illness, will start antibiotics to cover possible pneumonia.  -will admit to progressive unit as inpatient -continue BiPAP -Abx: Rocephin and azithromycin -Bronchodilators -Solu-Medrol 40 mg IV tid -Mucinex for cough  -Incentive spirometry -Follow up blood culture x2, sputum culture -Nasal cannula oxygen as needed to maintain O2 saturation 93% or greater when pt is off BiPAP  CAD in native artery: no CP, but reported some left-sided chest discomfort.  Troponin negative. -Continue Lipitor  Dyslipidemia -Lipitor  HTN: pt is soft -hold home Bp meds, amlodipine -hydralazine prn  Anxiety: -prn hydroxyzine   DVT ppx:  SQ Lovenox Code Status: DNR Family Communication: I spoke to her son by phone Disposition Plan:  Anticipate discharge back to previous home environment Consults called:  None Admission status:  progressive unit as inpt    Status is: Inpatient  Remains inpatient appropriate because:Inpatient level of care appropriate due to severity of illness.  Patient has multiple comorbidities, now presents with acute on chronic respiratory failure due to COPD exacerbation.  Patient has no acute respiratory distress.  Patient needs BiPAP.  Her presentation is highly complicated, at high risk of deterioration.  Will need to be treated in hospital for at least 2 days.  Dispo: The patient is from: Home              Anticipated d/c is to: Home              Anticipated d/c date is: 2 days              Patient currently is not medically  stable to d/c.            Date of Service 01/28/2020    Ivor Costa [facility]   If 7PM-7AM, please contact night-coverage [email or web address] 01/28/2020, 8:32 AM

## 2020-01-28 NOTE — ED Provider Notes (Signed)
[facility] Emergency Department Provider Note   ____________________________________________   First MD Initiated Contact with Patient 01/28/20 [phone]     (approximate)  I have reviewed the triage vital signs and the nursing notes.   HISTORY  Chief Complaint Shortness of Breath    HPI Carol Gay is a 74 y.o. female brought to the ED via EMS from home with a chief complaint of respiratory distress.  Patient has a history of COPD on 3 L continuous oxygen who reports progressive shortness of breath with wheezing and cough overnight.  EMS reports saturations of 70% on 3 L oxygen.  EMS administered 125 mg IV Solu-Medrol, 1 DuoNeb, increase patient's oxygen.  Patient arrives to the ED with saturations 77%.  Complains of chest tightness.  Denies fever, abdominal pain, nausea, vomiting.  Has not taken COVID-19 vaccination.       Past Medical History:  Diagnosis Date  . Aortic atherosclerosis ([place])   . Arm pain   . Asthma   . CAD in native artery   . Cancer ([place]) 09/2015   rectum  . Cigarette smoker   . Claustrophobia   . Colon cancer ([place])   . 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 COPD (chronic obstructive pulmonary disease) ([place])   . DDD (degenerative disc disease), lumbar       . Dermatophytoses   . Dyslipidemia   . H/O blood clots    left leg, veins stripped  . Hemorrhoids   . Hypertension   . Last menstrual period (LMP) > 10 days ago 1994  . Myocardial infarction [facility])    "mild" - age 58  . Personal history of chemotherapy   . Personal history of radiation therapy   . Pulmonary emphysema ([place])   . Rectal bleeding   . Shortness of breath dyspnea   . Smokers' cough ([place])   . Spinal headache    with one of my children's birth  . Vertigo   . Wears dentures    has full upper and lower - doesn't wear    Patient Active Problem List   Diagnosis Date Noted  . Dependence on continuous supplemental oxygen  12/28/2017  . Vitamin D deficiency 08/23/2017  . Age-related osteoporosis without current pathological fracture 08/22/2017  . Cyst of subcutaneous tissue 05/19/2017  . HPV (human papilloma virus) anogenital infection 07/07/2016  . Edema extremities 05/05/2016  . History of anal cancer 11/26/2015  . Blood in stool   . Benign neoplasm of transverse colon   . Benign neoplasm of rectosigmoid junction   . Fistula of intestine, excluding rectum and anus   . Pulmonary emphysema ([place]) 10/24/2015  . Encounter for long-term (current) use of medications 02/28/2015  . Aortic atherosclerosis ([place]) 12/30/2014  . CAD in native artery 12/30/2014  . DDD (degenerative disc disease), lumbar 12/30/2014  . Dyslipidemia 12/30/2014  . Essential (primary) hypertension 12/30/2014  . Dermatophytoses 12/30/2014  . Compulsive tobacco user syndrome 12/30/2014    Past Surgical History:  Procedure Laterality Date  . APPENDECTOMY    . BREAST BIOPSY Left 1970's   neg  . COLONOSCOPY WITH PROPOFOL N/A 11/06/2015   Procedure: COLONOSCOPY WITH PROPOFOL;  Surgeon: Lucilla Lame, MD;  Location: [facility];  Service: Endoscopy;  Laterality: N/A;  LEAVE PT EARLY  . EXPLORATORY LAPAROTOMY    . LESION EXCISION  11/03/15   Anal - Dr Dahlia Byes, [facility]  . LESION REMOVAL  1970's  from tailbone  . [place] SURGERY  1997  . OVARIAN CYST REMOVAL Left   . POLYPECTOMY  11/06/2015   Procedure: POLYPECTOMY;  Surgeon: Lucilla Lame, MD;  Location: [facility];  Service: Endoscopy;;  . PORTACATH PLACEMENT N/A 12/10/2015   Procedure: INSERTION PORT-A-CATH;  Surgeon: Jules Husbands, MD;  Location: [facility];  Service: General;  Laterality: N/A;  . RECTAL EXAM UNDER ANESTHESIA N/A 06/23/2016   Procedure: RECTAL EXAM UNDER ANESTHESIA;  Surgeon: Jules Husbands, MD;  Location: [facility];  Service: General;  Laterality: N/A;  . VEIN LIGATION AND STRIPPING      Prior to Admission medications   Medication Sig Start Date End Date  Taking? Authorizing Provider  acetaminophen (TYLENOL) 500 MG tablet Take 1,000 mg by mouth every 6 (six) hours as needed.    [provider]  albuterol (PROVENTIL HFA;VENTOLIN HFA) 108 (90 Base) MCG/ACT inhaler INHALE 2 PUFFS INTO THE LUNGS EVERY 6 HOURS AS NEEDED FOR WHEEZING OR SHORTNESS OF BREATH 01/17/18   Glean Hess, MD  amLODipine (NORVASC) 5 MG tablet TAKE 1 TABLET EVERY DAY 07/16/19   Glean Hess, MD  atorvastatin (LIPITOR) 20 MG tablet TAKE 1 TABLET AT BEDTIME 12/31/19   Glean Hess, MD  hydrOXYzine (ATARAX/VISTARIL) 25 MG tablet TAKE 1 TABLET TWICE DAILY AS NEEDED 11/12/19   Glean Hess, MD  ipratropium-albuterol (DUONEB) 0.5-2.5 (3) MG/3ML SOLN Take 3 mLs by nebulization 3 (three) times daily. 12/30/17   Laverle Hobby, MD  Magnesium 250 MG TABS Take by mouth.    [provider]  Multiple Vitamins-Minerals (CENTRUM SILVER PO) Take 1 tablet by mouth daily.    [provider]  OXYGEN Inhale 4 L/min into the lungs continuous.     [provider]  Potassium 99 MG TABS Take by mouth.    [provider]  TRELEGY ELLIPTA 100-62.5-25 MCG/INH AEPB INHALE 1 PUFF INTO THE LUNGS DAILY. 11/12/19   Glean Hess, MD    Allergies Aspirin, Codeine, and Morphine and related  Family History  Problem Relation Age of Onset  . Cancer Mother        lymphoma  . Heart disease Mother   . Cancer Maternal Grandmother        breast  . Birth defects Maternal Grandmother     Social History Social History   Tobacco Use  . Smoking status: Former Smoker    Packs/day: 1.00    Years: 57.00    Pack years: 57.00    Types: Cigarettes    Quit date: 03/30/2016    Years since quitting: 3.8  . Smokeless tobacco: Never Used  . Tobacco comment: Quit in July 2017  Substance Use Topics  . Alcohol use: No    Alcohol/week: 0.0 standard drinks  . Drug use: No    Review of Systems  Constitutional: No fever/chills Eyes: No visual  changes. ENT: No sore throat. Cardiovascular: Denies chest pain. Respiratory: Positive for shortness of breath. Gastrointestinal: No abdominal pain.  No nausea, no vomiting.  No diarrhea.  No constipation. Genitourinary: Negative for dysuria. Musculoskeletal: Negative for back pain. Skin: Negative for rash. Neurological: Negative for headaches, focal weakness or numbness.   ____________________________________________   PHYSICAL EXAM:  VITAL SIGNS: ED Triage Vitals  Enc Vitals Group     BP      Pulse      Resp      Temp      Temp src  SpO2      Weight      Height      Head Circumference      Peak Flow      Pain Score      Pain Loc      Pain Edu?      Excl. in [place]?     Constitutional: Alert and oriented.  Elderly appearing and in moderate acute distress. Eyes: Conjunctivae are normal. PERRL. EOMI. Head: Atraumatic. Nose: No congestion/rhinnorhea. Mouth/Throat: Mucous membranes are moist.  Oropharynx non-erythematous. Neck: No stridor.   Cardiovascular: Tachycardic rate, regular rhythm. Grossly normal heart sounds.  Good peripheral circulation. Respiratory: Increased respiratory effort.  No retractions. Lungs diminished with wheezing. Gastrointestinal: Soft and nontender. No distention. No abdominal bruits. No CVA tenderness. Musculoskeletal: No lower extremity tenderness nor edema.  No joint effusions. Neurologic:  Normal speech and language. No gross focal neurologic deficits are appreciated.  Skin:  Skin is warm, dry and intact. No rash noted. Psychiatric: Mood and affect are normal. Speech and behavior are normal.  ____________________________________________   LABS (all labs ordered are listed, but only abnormal results are displayed)  Labs Reviewed  LACTIC ACID, PLASMA - Abnormal; Notable for the following components:      Result Value   Lactic Acid, Venous 2.0 (*)    All other components within normal limits  CBC WITH DIFFERENTIAL/PLATELET -  Abnormal; Notable for the following components:   RBC 5.38 (*)    Hemoglobin 15.7 (*)    HCT 48.5 (*)    All other components within normal limits  COMPREHENSIVE METABOLIC PANEL - Abnormal; Notable for the following components:   Glucose, Bld 155 (*)    Total Protein 9.1 (*)    All other components within normal limits  BLOOD GAS, ARTERIAL - Abnormal; Notable for the following components:   pO2, Arterial 315 (*)    All other components within normal limits  RESPIRATORY PANEL BY RT PCR (FLU A&B, COVID)  CULTURE, BLOOD (ROUTINE X 2)  CULTURE, BLOOD (ROUTINE X 2)  LACTIC ACID, PLASMA  TROPONIN I (HIGH SENSITIVITY)  TROPONIN I (HIGH SENSITIVITY)   ____________________________________________  EKG  ED ECG REPORT I, Agustus Mane J, the attending physician, personally viewed and interpreted this ECG.   Date: 01/28/2020  EKG Time: 0552  Rate: 111  Rhythm: sinus tachycardia  Axis: Normal  Intervals:none  ST&T Change: Nonspecific  ____________________________________________  RADIOLOGY  ED MD interpretation: COPD  Official radiology report(s): DG Chest Port 1 View  Result Date: 01/28/2020 CLINICAL DATA:  Shortness of breath. EXAM: PORTABLE CHEST 1 VIEW COMPARISON:  Chest x-ray dated July 25, 2018. FINDINGS: The patient is rotated to the right. Unchanged right chest wall port catheter. The heart size and mediastinal contours are within normal limits. Normal pulmonary vascularity. The lungs remain hyperinflated with emphysematous changes. Bilateral lower lobe coarse interstitial and peribronchial thickening. Chronic subpleural reticulation the right upper lobe. No focal consolidation, pleural effusion, or pneumothorax. No acute osseous abnormality. IMPRESSION: COPD. Electronically Signed   By: Titus Dubin M.D.   On: 01/28/2020 07:01    ____________________________________________   PROCEDURES  Procedure(s) performed (including Critical Care):  .1-3 Lead EKG  Interpretation Performed by: Paulette Blanch, MD Authorized by: Paulette Blanch, MD     Interpretation: abnormal     ECG rate:  109   ECG rate assessment: tachycardic     Rhythm: sinus tachycardia     Ectopy: none     Conduction: normal   Comments:  Patient placed on cardiac monitor to monitor for arrhythmias   CRITICAL CARE Performed by: Paulette Blanch   Total critical care time: 45 minutes  Critical care time was exclusive of separately billable procedures and treating other patients.  Critical care was necessary to treat or prevent imminent or life-threatening deterioration.  Critical care was time spent personally by me on the following activities: development of treatment plan with patient and/or surrogate as well as nursing, discussions with consultants, evaluation of patient's response to treatment, examination of patient, obtaining history from patient or surrogate, ordering and performing treatments and interventions, ordering and review of laboratory studies, ordering and review of radiographic studies, pulse oximetry and re-evaluation of patient's condition.   ____________________________________________   INITIAL IMPRESSION / ASSESSMENT AND PLAN / ED COURSE  As part of my medical decision making, I reviewed the following data within the [facility] notes reviewed and incorporated, Labs reviewed, EKG interpreted, Old chart reviewed, Radiograph reviewed, Discussed with admitting physician and Notes from prior ED visits     Carol Gay was evaluated in Emergency Department on 01/28/2020 for the symptoms described in the history of present illness. She was evaluated in the context of the global COVID-19 pandemic, which necessitated consideration that the patient might be at risk for infection with the SARS-CoV-2 virus that causes COVID-19. Institutional protocols and algorithms that pertain to the evaluation of patients at risk for COVID-19 are in a  state of rapid change based on information released by regulatory bodies including the CDC and federal and state organizations. These policies and algorithms were followed during the patient's care in the ED.    74 year old female with COPD on 3 L continuous oxygen presenting with respiratory distress. Differential includes, but is not limited to, viral syndrome, bronchitis including COPD exacerbation, pneumonia, reactive airway disease including asthma, CHF including exacerbation with or without pulmonary/interstitial edema, pneumothorax, ACS, thoracic trauma, and pulmonary embolism.  Administer DuoNeb, place on BiPAP.  Pain lab work, chest x-ray.  Anticipate hospitalization.   Clinical Course as of Jan 28 720  Mon Jan 28, 2020  0607 Sats 100% on BiPAP.  Patient feeling anxious; will administer low-dose IV Ativan for comfort.  I verified with the patient with nursing and respiratory staff in the room that she does desire to keep her DNR that is in place. Desires DNI. No chest compressions.  No resuscitative medications.   [JS]  [id number] Elevated lactic acid.  Wet read of chest x-ray looks like pneumonia.  Will initiate ED code sepsis and administer IV antibiotics.  Will discuss with hospitalist services for admission.   [JS]    Clinical Course User Index [JS] Paulette Blanch, MD     ____________________________________________   FINAL CLINICAL IMPRESSION(S) / ED DIAGNOSES  Final diagnoses:  COPD exacerbation ([place])  Hypoxia  Elevated lactic acid level     ED Discharge Orders    None       Note:  This document was prepared using Dragon voice recognition software and may include unintentional dictation errors.   Paulette Blanch, MD 01/28/20 [phone]

## 2020-01-28 NOTE — Progress Notes (Signed)
Transported pt to 241 on Bipap without incident. Pt remains on Bipap and is tol well at this time. Report given to receiving RT.

## 2020-01-28 NOTE — ED Notes (Signed)
Order for xanax not placed until patient off the floor. Receiving nurse informed.

## 2020-01-29 DIAGNOSIS — Z7189 Other specified counseling: Secondary | ICD-10-CM

## 2020-01-29 DIAGNOSIS — Z515 Encounter for palliative care: Secondary | ICD-10-CM

## 2020-01-29 LAB — PROCALCITONIN: Procalcitonin: <0.1 ng/mL

## 2020-01-29 MED ORDER — AZITHROMYCIN 250 MG PO TABS
500.0000 mg | ORAL_TABLET | Freq: Every day | ORAL | Status: AC
  Administered 2020-01-30 – 2020-02-01 (×3): 500 mg via ORAL
  Filled 2020-01-29 (×3): qty 2

## 2020-01-29 MED ORDER — CHLORHEXIDINE GLUCONATE CLOTH 2 % EX PADS
6.0000 | MEDICATED_PAD | Freq: Every day | CUTANEOUS | Status: DC
  Administered 2020-01-29 – 2020-02-03 (×5): 6 via TOPICAL

## 2020-01-29 MED ORDER — ALUM & MAG HYDROXIDE-SIMETH 200-200-20 MG/5ML PO SUSP
30.0000 mL | Freq: Four times a day (QID) | ORAL | Status: DC | PRN
  Administered 2020-01-29 – 2020-01-31 (×4): 30 mL via ORAL
  Filled 2020-01-29 (×5): qty 30

## 2020-01-29 NOTE — TOC Initial Note (Signed)
Transition of Care [facility]) - Initial/Assessment Note    Patient Details  Name: Carol Gay MRN: [id number] Date of Birth: [birth year]  [facility] CM/SW Contact:    Shelbie Ammons, RN Phone Number: 01/29/2020, 11:56 AM  Clinical Narrative:       RNCM assessed patient at bedside, patient is alert and verbally responsive and reports she is feeling some better today. Patient reports that she lives at home alone and cares for herself aside from someone who comes into clean every other week and she was supposed to have services start today from Homecare providers for several hours two times a week. Patient reports that her oxygen is from [employer] [facility] and she has never had [facility] services. Patient did discuss that she wasn't sure how to get in touch with HomeCare providers, assured her that this CM would make contact.  Patient awaiting palliative consult, will follow after they see her for needs.  Contacted HomeCare providers and spoke with April to make them aware she is in hospital.  Lilyan Gilford with [employer] to make him aware that patient may need oxygen concentrator upgraded pending discharge disposition.  RNCM will follow for needs.             Expected Discharge Plan: [place] Barriers to Discharge: Continued Medical Work up   Patient Goals and CMS Choice        Expected Discharge Plan and Services Expected Discharge Plan: [place]   Discharge Planning Services: CM Consult   Living arrangements for the past 2 months: Single Family Home                   DME Agency: [employer]     Representative spoke with at DME Agency: [facility] to inform of Oxygen issues            Prior Living Arrangements/Services Living arrangements for the past 2 months: Single Family Home Lives with:: Self Patient language and need for interpreter reviewed:: Yes Do you feel safe going back to the place where you live?: Yes      Need for  Family Participation in Patient Care: Yes (Comment) Care giver support system in place?: Yes (comment)   Criminal Activity/Legal Involvement Pertinent to Current Situation/Hospitalization: No - Comment as needed  Activities of Daily Living Home Assistive Devices/Equipment: None ADL Screening (condition at time of admission) Patient's cognitive ability adequate to safely complete daily activities?: Yes Is the patient deaf or have difficulty hearing?: No Does the patient have difficulty seeing, even when wearing glasses/contacts?: No Does the patient have difficulty concentrating, remembering, or making decisions?: No Patient able to express need for assistance with ADLs?: Yes Does the patient have difficulty dressing or bathing?: No Independently performs ADLs?: Yes (appropriate for developmental age) Does the patient have difficulty walking or climbing stairs?: No Weakness of Legs: Both Weakness of Arms/Hands: Both  Permission Sought/Granted                  Emotional Assessment Appearance:: Appears stated age Attitude/Demeanor/Rapport: Engaged, Gracious Affect (typically observed): Appropriate, Happy Orientation: : Oriented to Self, Oriented to Place, Oriented to  Time, Oriented to Situation Alcohol / Substance Use: Not Applicable Psych Involvement: No (comment)  Admission diagnosis:  Hypoxia [R09.02] COPD exacerbation (HCC) [J44.1] Elevated lactic acid level [R79.89] Patient Active Problem List   Diagnosis Date Noted  . COPD exacerbation ([place]) 01/28/2020  . Acute on chronic respiratory failure with hypoxia (HCC)  01/28/2020  . Anxiety 01/28/2020  . Dependence on continuous supplemental oxygen 12/28/2017  . Vitamin D deficiency 08/23/2017  . Age-related osteoporosis without current pathological fracture 08/22/2017  . Cyst of subcutaneous tissue 05/19/2017  . HPV (human papilloma virus) anogenital infection 07/07/2016  . Edema extremities 05/05/2016  . History of anal  cancer 11/26/2015  . Blood in stool   . Benign neoplasm of transverse colon   . Benign neoplasm of rectosigmoid junction   . Fistula of intestine, excluding rectum and anus   . Pulmonary emphysema ([place]) 10/24/2015  . Encounter for long-term (current) use of medications 02/28/2015  . Aortic atherosclerosis ([place]) 12/30/2014  . CAD in native artery 12/30/2014  . DDD (degenerative disc disease), lumbar 12/30/2014  . Dyslipidemia 12/30/2014  . Essential (primary) hypertension 12/30/2014  . Dermatophytoses 12/30/2014  . Compulsive tobacco user syndrome 12/30/2014   PCP:  Glean Hess, MD Pharmacy:   [facility] [address]), [place] [address] Phone: [phone] Fax: [facility] [place], [place] [address] Phone: [phone] Fax: [phone]     Social Determinants of Health (SDOH) Interventions    Readmission Risk Interventions No flowsheet data found.

## 2020-01-29 NOTE — Plan of Care (Signed)
  Problem: Coping: Goal: Level of anxiety will decrease Outcome: Progressing  PRN medication administered to relieve anxiety. Will continue to monitor to ensure comfort and safety.

## 2020-01-29 NOTE — Plan of Care (Addendum)
Full note to follow. Talked with patient. Patient's goals are to continue to treat the treatable up to and including BIPAP. She confirms DNR/DNI. Recommend palliative to follow at D/C, and transition to hospice if patient chooses in the future.

## 2020-01-29 NOTE — Evaluation (Signed)
Occupational Therapy Evaluation Patient Details Name: Carol Gay MRN: [id number] DOB: 17-Jul-1946 Today's Date: 01/29/2020    History of Present Illness Pt admitted for COPD exacerbation with complaints of SOB symptoms. HIstory includes COPD on 3L of O2, HTN, HLD, asthma, COPD, and colon cancer. Currently on 9L of O2 upon arrival for OT evaluation.   Clinical Impression   Pt seen for OT evaluation this date. Pt was independent in all ADL and functional mobility, living in a 1 story home alone. Pt on 3.5 liters of O2 at home. Pt reports becoming easily fatigued or out of breath with minimal exertion. Pt currently requires significantly increased time and effort to perform with drop in O2 sats on low-mid-80's on 9L O2. With cues for PLB, does eventually recover to mid-high 80's. Pt educated in energy conservation strategies including pursed lip breathing, activity pacing, home/routines modifications, work simplification, AE/DME, prioritizing of meaningful occupations, and falls prevention. Handout provided. Pt verbalized understanding and would benefit from additional skilled OT services to maximize recall and carryover of learned techniques and facilitate implementation of learned techniques into daily routines. Upon discharge, recommend [place] services.      Follow Up Recommendations  Home health OT    Equipment Recommendations  None recommended by OT    Recommendations for Other Services       Precautions / Restrictions Precautions Precautions: Fall Precaution Comments: monitor O2 sats Restrictions Weight Bearing Restrictions: No      Mobility Bed Mobility Overal bed mobility: Modified Independent             General bed mobility comments: continued drop in O2 with effort, requiring increased time with emphasis on PLB to recover  Transfers Overall transfer level: Needs assistance Equipment used: None Transfers: Sit to/from Stand Sit to Stand: Supervision          General transfer comment: upright posture with O2 sats decreased to 76%. Unable to tolerate standing >1 min. Returned back supine with O2 sats decreasing to 70%. Further mobility attempts ceased. INcreased SOB symptoms noted    Balance Overall balance assessment: Mild deficits observed, not formally tested                                         ADL either performed or assessed with clinical judgement   ADL Overall ADL's : Modified independent                                       General ADL Comments: able to perform with increased time/effort or modifications to the task to minimize SOB/over exertion     Vision Baseline Vision/History: Wears glasses Wears Glasses: Reading only Patient Visual Report: No change from baseline       Perception     Praxis      Pertinent Vitals/Pain Pain Assessment: 0-10 Pain Score: 5  Pain Location: chest/underneath L breast, RN aware Pain Descriptors / Indicators: Aching Pain Intervention(s): Limited activity within patient's tolerance;Monitored during session;Repositioned;Patient requesting pain meds-RN notified     Hand Dominance     Extremity/Trunk Assessment Upper Extremity Assessment Upper Extremity Assessment: Generalized weakness   Lower Extremity Assessment Lower Extremity Assessment: Generalized weakness       Communication Communication Communication: No difficulties   Cognition Arousal/Alertness: Awake/alert Behavior During Therapy: WFL for tasks assessed/performed  Overall Cognitive Status: Within Functional Limits for tasks assessed                                     General Comments       Exercises Exercises: Other exercises Other Exercises Other Exercises: seated EOB educated on pursed lip breathing in order to recover from activity. 9L of O2 required for mobility, however unable to maintain sats. Other Exercises: Pt educated in ECS with handout provided. Pt  verbalized understanding but required additional cues to utilize PLB during session   Shoulder Instructions      Home Living Family/patient expects to be discharged to:: Private residence Living Arrangements: Alone Available Help at Discharge: Friend(s);Neighbor Type of Home: House Home Access: Stairs to enter [employer] of Steps: 2 Entrance Stairs-Rails: None Home Layout: One level               Home Equipment: Environmental consultant - 2 wheels;Cane - single point          Prior Functioning/Environment Level of Independence: Independent        Comments: prior to admission, pt was indep with no falls. Limited endurance due to O2 demand. HOusehold ambulator. Unable to ambulate outside of home due to poor endurance        OT Problem List: Cardiopulmonary status limiting activity;Pain;Decreased knowledge of use of DME or AE;Decreased activity tolerance      OT Treatment/Interventions: Self-care/ADL training;Therapeutic exercise;Therapeutic activities;Energy conservation;DME and/or AE instruction;Patient/family education    OT Goals(Current goals can be found in the care plan section) Acute Rehab OT Goals Patient Stated Goal: to walk around her house without SOB OT Goal Formulation: With patient Time For Goal Achievement: 02/12/20 ADL Goals Additional ADL Goal #1: Pt will verbalize plan to implement at least 2 learned energy conservation strategies to maximize independence and minimize SOB/over exertion Additional ADL Goal #2: Pt will independently monitor vital signs using equipment and grade activity based on vitals to minimize over exertion/SOB.  OT Frequency: Min 1X/week   Barriers to D/C:            Co-evaluation              AM-PAC OT "6 Clicks" Daily Activity     Outcome Measure Help from another person eating meals?: None Help from another person taking care of personal grooming?: None Help from another person toileting, which includes using toliet,  bedpan, or urinal?: A Little Help from another person bathing (including washing, rinsing, drying)?: A Little Help from another person to put on and taking off regular upper body clothing?: None Help from another person to put on and taking off regular lower body clothing?: A Little 6 Click Score: 21   End of Session Nurse Communication: Patient requests pain meds  Activity Tolerance: Patient tolerated treatment well Patient left: in bed;with call bell/phone within reach;with bed alarm set  OT Visit Diagnosis: Other abnormalities of gait and mobility (R26.89);Muscle weakness (generalized) (M62.81)                Time: 9201-0071 OT Time Calculation (min): 32 min Charges:  OT General Charges $OT Visit: 1 Visit OT Evaluation $OT Eval Low Complexity: 1 Low OT Treatments $Self Care/Home Management : 23-37 mins  Jeni Salles, MPH, MS, OTR/L ascom [phone] 01/29/20, 5:01 PM

## 2020-01-29 NOTE — Progress Notes (Signed)
PROGRESS NOTE    Carol Gay  JME:[id number] DOB: Jul 05, 1946 DOA: 01/28/2020 PCP: Glean Hess, MD   Brief Narrative:  Carol Gay is a 74 y.o. female with medical history significant of COPD on 3 L oxygen, hypertension, hyperlipidemia, asthma, CAD, solitary kidney (congenital absence of left kidney), anxiety, colon cancer, aortic stenosis, former smoker, who presents with shortness of breath.  Patient states that her shortness breath has been worsening since yesterday. She has cough, wheezing, progressively worsening shortness of breath.Found to be to be hypoxic requiring BiPAP initially, weaned off from BiPAP but continue to require more than her home oxygen.  Subjective: Patient continued to have dyspnea with very minor exertion, even with talking.  She was saturating in mid 80s on 6 L high flow nasal cannula while I was in the room, desaturating to high 70s quickly just with talking.  Able to complete sentences.  Per patient she remains in mid 39s most of the time on her current oxygen regimen of 3.5 L.  Unable to increased her home oxygen as requiring new equipment and a new physician order.  Per patient she desaturated to 70s by moving around, takes couple of breaks while going from living room to the kitchen which is next to each other.  Her saturation improves to mid 80s when she sit down and take few deep breaths.  Yesterday she was unable to keep her oxygen up and becoming more tachycardic and decided to call EMS.  Assessment & Plan:   Principal Problem:   COPD exacerbation (Agency) Active Problems:   CAD in native artery   Dyslipidemia   Essential (primary) hypertension   Acute on chronic respiratory failure with hypoxia (HCC)   Anxiety  Acute on chronic respiratory failure with hypoxia due to COPD exacerbation. Patient with advanced COPD and should be on higher level of oxygen.  She was saturating between 88 to 90% on 7 L with HFNC.  She was not wheezing today but  to have some scattered dry crackles.  Cultures negative, afebrile, no leukocytosis. She was started on Rocephin and azithromycin yesterday due to some concern of pneumonia with her advanced lung disease. -Check pro calcitonin-if negative we will discontinue Rocephin. -Continue azithromycin. -Continue BiPAP as needed -Decrease Solu-Medrol dose to daily. -Continue supplemental oxygen to keep saturation above 88%. -Palliative care consult as patient most likely will qualify for outpatient/in-home hospice care as she wants to remain independent and at home.  She needs some extra help at home as lives alone. -Asked TOC to verify from her oxygen supply company what needs to be done in order for her to get higher levels of oxygen at home.  CAD in native artery: No chest pain, troponin remain negative. -Continue Lipitor.  Hypertension.  Blood pressure within goal. -Keep holding home dose of amlodipine which was held on admission due to softer blood pressure.  It can be restarted once blood pressure elevated.  Anxiety. -Continue as needed hydroxyzine.  Objective: Vitals:   01/29/20 0809 01/29/20 0817 01/29/20 1112 01/29/20 1119  BP:   (!) 127/49   Pulse:   95   Resp:   18   Temp:   98 F (36.7 C)   TempSrc:   Oral   SpO2: 92% 96% 90% 90%  Weight:      Height:        Intake/Output Summary (Last 24 hours) at 01/29/2020 1154 Last data filed at 01/29/2020 0614 Gross per 24 hour  Intake 250 ml  Output 0 ml  Net 250 ml   Filed Weights   01/28/20 0549 01/28/20 2101 01/29/20 0534  Weight: 79.8 kg 82.4 kg 82 kg    Examination:  General exam: Appears calm and comfortable  Respiratory system: Few scattered dry crackles all over. Respiratory effort normal. Cardiovascular system: S1 & S2 heard, RRR. No JVD, murmurs, rubs, gallops or clicks. Gastrointestinal system: Soft, nontender, nondistended, bowel sounds positive. Central nervous system: Alert and oriented. No focal neurological  deficits. Extremities: No edema, no cyanosis, pulses intact and symmetrical. Psychiatry: Judgement and insight appear normal.    DVT prophylaxis: Lovenox Code Status: DNR Family Communication: Discussed with patient, she wants to talk with her sons herself. Disposition Plan:  Status is: Inpatient  Remains inpatient appropriate because:Inpatient level of care appropriate due to severity of illness   Dispo: The patient is from: Home              Anticipated d/c is to: Home              Anticipated d/c date is: 1 day              Patient currently is not medically stable to d/c.  Most likely she will need higher levels of oxygen on discharge due to worsening underlying COPD.  Palliative care was consulted for possible hospice care at home to provide her more resources.  Consultants:   None  Procedures:  Antimicrobials:  Rocephin Azithromycin  Data Reviewed: I have personally reviewed following labs and imaging studies  CBC: Recent Labs  Lab 01/28/20 0602  WBC 10.0  NEUTROABS 7.1  HGB 15.7*  HCT 48.5*  MCV 90.1  PLT 694   Basic Metabolic Panel: Recent Labs  Lab 01/28/20 0602  NA 138  K 3.8  CL 106  CO2 23  GLUCOSE 155*  BUN 16  CREATININE 0.67  CALCIUM 9.9   GFR: Estimated Creatinine Clearance: 63.9 mL/min (by C-G formula based on SCr of 0.67 mg/dL). Liver Function Tests: Recent Labs  Lab 01/28/20 0602  AST 22  ALT 13  ALKPHOS 78  BILITOT 0.7  PROT 9.1*  ALBUMIN 4.6   No results for input(s): LIPASE, AMYLASE in the last 168 hours. No results for input(s): AMMONIA in the last 168 hours. Coagulation Profile: No results for input(s): INR, PROTIME in the last 168 hours. Cardiac Enzymes: No results for input(s): CKTOTAL, CKMB, CKMBINDEX, TROPONINI in the last 168 hours. BNP (last 3 results) No results for input(s): PROBNP in the last 8760 hours. HbA1C: No results for input(s): HGBA1C in the last 72 hours. CBG: No results for input(s): GLUCAP in the  last 168 hours. Lipid Profile: No results for input(s): CHOL, HDL, LDLCALC, TRIG, CHOLHDL, LDLDIRECT in the last 72 hours. Thyroid Function Tests: No results for input(s): TSH, T4TOTAL, FREET4, T3FREE, THYROIDAB in the last 72 hours. Anemia Panel: No results for input(s): VITAMINB12, FOLATE, FERRITIN, TIBC, IRON, RETICCTPCT in the last 72 hours. Sepsis Labs: Recent Labs  Lab 01/28/20 0557 01/28/20 0845  LATICACIDVEN 2.0* 1.9    Recent Results (from the past 240 hour(s))  Respiratory Panel by RT PCR (Flu A&B, Covid) - Nasopharyngeal Swab     Status: None   Collection Time: 01/28/20  5:57 AM   Specimen: Nasopharyngeal Swab  Result Value Ref Range Status   SARS Coronavirus 2 by RT PCR NEGATIVE NEGATIVE Final    Comment: (NOTE) SARS-CoV-2 target nucleic acids are NOT DETECTED. The SARS-CoV-2 RNA is generally detectable in upper respiratoy  specimens during the acute phase of infection. The lowest concentration of SARS-CoV-2 viral copies this assay can detect is 131 copies/mL. A negative result does not preclude SARS-Cov-2 infection and should not be used as the sole basis for treatment or other patient management decisions. A negative result may occur with  improper specimen collection/handling, submission of specimen other than nasopharyngeal swab, presence of viral mutation(s) within the areas targeted by this assay, and inadequate number of viral copies (<131 copies/mL). A negative result must be combined with clinical observations, patient history, and epidemiological information. The expected result is Negative. Fact Sheet for Patients:  PinkCheek.be Fact Sheet for Healthcare Providers:  GravelBags.it This test is not yet ap proved or cleared by the [place] FDA and  has been authorized for detection and/or diagnosis of SARS-CoV-2 by FDA under an Emergency Use Authorization (EUA). This EUA will remain  in effect  (meaning this test can be used) for the duration of the COVID-19 declaration under Section 564(b)(1) of the Act, 21 U.S.C. section 360bbb-3(b)(1), unless the authorization is terminated or revoked sooner.    Influenza A by PCR NEGATIVE NEGATIVE Final   Influenza B by PCR NEGATIVE NEGATIVE Final    Comment: (NOTE) The Xpert Xpress SARS-CoV-2/FLU/RSV assay is intended as an aid in  the diagnosis of influenza from Nasopharyngeal swab specimens and  should not be used as a sole basis for treatment. Nasal washings and  aspirates are unacceptable for Xpert Xpress SARS-CoV-2/FLU/RSV  testing. Fact Sheet for Patients: PinkCheek.be Fact Sheet for Healthcare Providers: GravelBags.it This test is not yet approved or cleared by the [place] FDA and  has been authorized for detection and/or diagnosis of SARS-CoV-2 by  FDA under an Emergency Use Authorization (EUA). This EUA will remain  in effect (meaning this test can be used) for the duration of the  Covid-19 declaration under Section 564(b)(1) of the Act, 21  U.S.C. section 360bbb-3(b)(1), unless the authorization is  terminated or revoked. Performed at [facility], [address] [place], [place] [address]   Culture, blood (routine x 2)     Status: None (Preliminary result)   Collection Time: 01/28/20  5:58 AM   Specimen: BLOOD  Result Value Ref Range Status   Specimen Description BLOOD LEFT ARM  Final   Special Requests   Final    BOTTLES DRAWN AEROBIC AND ANAEROBIC Blood Culture adequate volume   Culture   Final    NO GROWTH 1 DAY Performed at [facility], [address], [place], [place] [address]    Report Status PENDING  Incomplete  Culture, blood (routine x 2)     Status: None (Preliminary result)   Collection Time: 01/28/20  5:58 AM   Specimen: BLOOD  Result Value Ref Range Status   Specimen Description BLOOD RIGHT HAND  Final   Special  Requests   Final    BOTTLES DRAWN AEROBIC AND ANAEROBIC Blood Culture adequate volume   Culture   Final    NO GROWTH 1 DAY Performed at [facility], [address], [place], [place] [address]    Report Status PENDING  Incomplete     Radiology Studies: DG Chest Port 1 View  Result Date: 01/28/2020 CLINICAL DATA:  Shortness of breath. EXAM: PORTABLE CHEST 1 VIEW COMPARISON:  Chest x-ray dated July 25, 2018. FINDINGS: The patient is rotated to the right. Unchanged right chest wall port catheter. The heart size and mediastinal contours are within normal limits. Normal pulmonary vascularity. The  lungs remain hyperinflated with emphysematous changes. Bilateral lower lobe coarse interstitial and peribronchial thickening. Chronic subpleural reticulation the right upper lobe. No focal consolidation, pleural effusion, or pneumothorax. No acute osseous abnormality. IMPRESSION: COPD. Electronically Signed   By: Titus Dubin M.D.   On: 01/28/2020 07:01    Scheduled Meds:  atorvastatin  20 mg Oral QHS   [START ON 01/30/2020] azithromycin  500 mg Oral Daily   Chlorhexidine Gluconate Cloth  6 each Topical Daily   enoxaparin (LOVENOX) injection  40 mg Subcutaneous Q24H   ipratropium-albuterol  3 mL Nebulization Q4H   methylPREDNISolone (SOLU-MEDROL) injection  40 mg Intravenous Q12H   Continuous Infusions:  cefTRIAXone (ROCEPHIN)  IV 2 g (01/29/20 0918)     LOS: 1 day   Time spent: 40 minutes.  Lorella Nimrod, MD [facility]  If 7PM-7AM, please contact night-coverage [email or web address]  01/29/2020, 11:54 AM   This record has been created using Systems analyst. Errors have been sought and corrected,but may not always be located. Such creation errors do not reflect on the standard of care.

## 2020-01-29 NOTE — Evaluation (Addendum)
Physical Therapy Evaluation Patient Details Name: Carol Gay MRN: [id number] DOB: 03/18/1946 Today's Date: 01/29/2020   History of Present Illness  Pt admitted for COPD exacerbation with complaints of SOB symptoms. HIstory includes COPD on 3L of O2, HTN, HLD, asthma, COPD, and colon cancer. Currently on 7L of O2 upon arrival for evaluation.  Clinical Impression  Pt is a pleasant 74 year old female who was admitted for COPD exacerbation. Pt performs bed mobility with mod I and transfers with supervision and no AD. Needed to increase O2 needs from 7L of HFNC to 9L of HFNC. Unable to maintain sats with mobility, further activity deferred. HR increases to 115bpm with exertion. Pt reports she is chronically in 80-85% on 3.5L of O2 at baseline. INcreased SOB symptoms with exertion this date. Pt demonstrates deficits with strength/mobility/endurance. Would benefit from skilled PT to address above deficits and promote optimal return to PLOF. Recommend transition to [place] upon discharge from acute hospitalization.   Follow Up Recommendations Home health PT    Equipment Recommendations  None recommended by PT    Recommendations for Other Services       Precautions / Restrictions Precautions Precautions: Fall Restrictions Weight Bearing Restrictions: No      Mobility  Bed Mobility Overal bed mobility: Modified Independent             General bed mobility comments: safe technique with ability to demonstrate upright posture. O2 increased for mobility purposes to 9L of O2. O2 sats decrease to 79%. IMproves to 84% with seated rest break with pursed lip breathing.  Transfers Overall transfer level: Needs assistance Equipment used: None Transfers: Sit to/from Stand Sit to Stand: Supervision         General transfer comment: upright posture with O2 sats decreased to 76%. Unable to tolerate standing >1 min. Returned back supine with O2 sats decreasing to 70%. Further mobility attempts  ceased. INcreased SOB symptoms noted  Ambulation/Gait             General Gait Details: deferred due to symptoms  Stairs            Wheelchair Mobility    Modified Rankin (Stroke Patients Only)       Balance Overall balance assessment: Mild deficits observed, not formally tested                                           Pertinent Vitals/Pain Pain Assessment: No/denies pain    Home Living Family/patient expects to be discharged to:: Private residence Living Arrangements: Alone Available Help at Discharge: Friend(s);Neighbor Type of Home: House Home Access: Stairs to enter Entrance Stairs-Rails: None Entrance Stairs-Number of Steps: 2 Home Layout: One level Home Equipment: Environmental consultant - 2 wheels;Cane - single point      Prior Function Level of Independence: Independent         Comments: prior to admission, pt was indep with no falls. Limited endurance due to O2 demand. HOusehold ambulator. Unable to ambulate outside of home due to poor endurance     Hand Dominance        Extremity/Trunk Assessment   Upper Extremity Assessment Upper Extremity Assessment: Generalized weakness(B UE grossly 4/5)    Lower Extremity Assessment Lower Extremity Assessment: Generalized weakness(B LE grossly 3+/5)       Communication   Communication: No difficulties  Cognition Arousal/Alertness: Awake/alert Behavior During Therapy: WFL for  tasks assessed/performed Overall Cognitive Status: Within Functional Limits for tasks assessed                                        General Comments      Exercises Other Exercises Other Exercises: seated EOB educated on pursed lip breathing in order to recover from activity. 9L of O2 required for mobility, however unable to maintain sats.   Assessment/Plan    PT Assessment Patient needs continued PT services  PT Problem List Decreased strength;Decreased activity tolerance;Decreased  balance;Decreased mobility;Cardiopulmonary status limiting activity       PT Treatment Interventions Gait training;DME instruction;Therapeutic exercise;Therapeutic activities    PT Goals (Current goals can be found in the Care Plan section)  Acute Rehab PT Goals Patient Stated Goal: to walk around her house without SOB PT Goal Formulation: With patient Time For Goal Achievement: 02/12/20 Potential to Achieve Goals: Good    Frequency Min 2X/week   Barriers to discharge        Co-evaluation               AM-PAC PT "6 Clicks" Mobility  Outcome Measure Help needed turning from your back to your side while in a flat bed without using bedrails?: None Help needed moving from lying on your back to sitting on the side of a flat bed without using bedrails?: A Little Help needed moving to and from a bed to a chair (including a wheelchair)?: A Little Help needed standing up from a chair using your arms (e.g., wheelchair or bedside chair)?: A Little Help needed to walk in hospital room?: A Little Help needed climbing 3-5 steps with a railing? : A Lot 6 Click Score: 18    End of Session Equipment Utilized During Treatment: Gait belt;Oxygen Activity Tolerance: Patient limited by fatigue Patient left: in bed;with bed alarm set Nurse Communication: Mobility status PT Visit Diagnosis: Unsteadiness on feet (R26.81);Muscle weakness (generalized) (M62.81);History of falling (Z91.81);Difficulty in walking, not elsewhere classified (R26.2)    Time: 4536-4680 PT Time Calculation (min) (ACUTE ONLY): 28 min   Charges:   PT Evaluation $PT Eval Moderate Complexity: 1 Mod PT Treatments $Therapeutic Activity: 8-22 mins        Greggory Stallion, PT, DPT [phone]   [place] 01/29/2020, 2:27 PM

## 2020-01-30 LAB — BASIC METABOLIC PANEL
Anion gap: 6 (ref 5–15)
BUN: 27 mg/dL — ABNORMAL HIGH (ref 8–23)
CO2: 26 mmol/L (ref 22–32)
Calcium: 9.7 mg/dL (ref 8.9–10.3)
Chloride: 107 mmol/L (ref 98–111)
Creatinine, Ser: 0.72 mg/dL (ref 0.44–1.00)
GFR calc Af Amer: >60 mL/min (ref >60)
GFR calc non Af Amer: >60 mL/min (ref >60)
Glucose, Bld: 126 mg/dL — ABNORMAL HIGH (ref 70–99)
Potassium: 4.2 mmol/L (ref 3.5–5.1)
Sodium: 139 mmol/L (ref 135–145)

## 2020-01-30 LAB — CBC
HCT: 38.4 % (ref 36.0–46.0)
Hemoglobin: 12.4 g/dL (ref 12.0–15.0)
MCH: 29.5 pg (ref 26.0–34.0)
MCHC: 32.3 g/dL (ref 30.0–36.0)
MCV: 91.2 fL (ref 80.0–100.0)
Platelets: 191 10*3/uL (ref 150–400)
RBC: 4.21 MIL/uL (ref 3.87–5.11)
RDW: 15.8 % — ABNORMAL HIGH (ref 11.5–15.5)
WBC: 8.8 10*3/uL (ref 4.0–10.5)
nRBC: 0 % (ref 0.0–0.2)

## 2020-01-30 MED ORDER — IPRATROPIUM-ALBUTEROL 0.5-2.5 (3) MG/3ML IN SOLN
3.0000 mL | Freq: Four times a day (QID) | RESPIRATORY_TRACT | Status: DC
  Administered 2020-01-30 – 2020-02-03 (×16): 3 mL via RESPIRATORY_TRACT
  Filled 2020-01-30 (×17): qty 3

## 2020-01-30 NOTE — Progress Notes (Signed)
PROGRESS NOTE    Carol Gay  VEH:[id number] DOB: 11/19/1945 DOA: 01/28/2020 PCP: Glean Hess, MD   Brief Narrative:  Carol Gay is a 74 y.o. female with medical history significant of COPD on 3 L oxygen, hypertension, hyperlipidemia, asthma, CAD, solitary kidney (congenital absence of left kidney), anxiety, colon cancer, aortic stenosis, former smoker, who presents with shortness of breath.  Patient states that her shortness breath has been worsening since yesterday. She has cough, wheezing, progressively worsening shortness of breath.Found to be to be hypoxic requiring BiPAP initially, weaned off from BiPAP but continue to require more than her home oxygen.  Subjective: Pt reported DOE, O2 desat with just talking, although able to have full conversation and eat meals.  No fever, chest pain, abdominal pain, N/V/D, dysuria, increased swelling.   Assessment & Plan:   Principal Problem:   COPD exacerbation ([place]) Active Problems:   CAD in native artery   Dyslipidemia   Essential (primary) hypertension   Acute on chronic respiratory failure with hypoxia (HCC)   Anxiety  Acute on chronic respiratory failure with hypoxia due to COPD exacerbation. Patient with advanced COPD and should be on higher level of oxygen.  She was saturating between 88 to 90% on 7 L with HFNC, increased to 9L today.   --Cultures negative, afebrile, no leukocytosis. --She was started on Rocephin and azithromycin due to some concern of pneumonia with her advanced lung disease.  Procal came back neg. PLAN: --discontinue Rocephin. -Continue azithromycin. -Continue BiPAP as needed -Solu-Medrol daily.  Scheduled Duo-Neb -Continue supplemental oxygen to keep saturation above 88%. -Palliative care consult as patient most likely will qualify for outpatient/in-home hospice care as she wants to remain independent and at home.  She needs some extra help at home as lives alone. -Asked TOC to verify from  her oxygen supply company what needs to be done in order for her to get higher levels of oxygen at home.  CAD in native artery: No chest pain, troponin remain negative. -Continue Lipitor.  Hypertension.  Blood pressure within goal. -Keep holding home dose of amlodipine which was held on admission due to softer blood pressure.  It can be restarted once blood pressure elevated.  Anxiety. -Continue as needed hydroxyzine.  Objective: Vitals:   01/30/20 0747 01/30/20 1104 01/30/20 1351 01/30/20 1605  BP:  124/67  137/74  Pulse: 80 81 89 91  Resp: 16 19 16 18   Temp:  97.6 F (36.4 C)  97.9 F (36.6 C)  TempSrc:  Oral  Oral  SpO2: 90% 95% (!) 89% 92%  Weight:      Height:        Intake/Output Summary (Last 24 hours) at 01/30/2020 1839 Last data filed at 01/30/2020 1806 Gross per 24 hour  Intake 480 ml  Output 3400 ml  Net -2920 ml   Filed Weights   01/28/20 2101 01/29/20 0534 01/30/20 0329  Weight: 82.4 kg 82 kg 81.8 kg    Examination:  Constitutional: NAD, AAOx3 HEENT: conjunctivae and lids normal, EOMI CV: RRR no M,R,G. Distal pulses +2.  No cyanosis.   RESP: CTA B/L, no wheezes, normal respiratory effort, on 9L sating 89% GI: +BS, NTND Extremities: No effusions, edema, or tenderness in BLE SKIN: warm, dry and intact Neuro: II - XII grossly intact.  Sensation intact Psych: Normal mood and affect.  Appropriate judgement and reason    DVT prophylaxis: Lovenox Code Status: DNR Family Communication: Discussed with patient, she wants to talk with her sons  herself. Disposition Plan:  Status is: Inpatient  Remains inpatient appropriate because:Inpatient level of care appropriate due to severity of illness   Dispo: The patient is from: Home              Anticipated d/c is to: Home              Anticipated d/c date is: unknown when              Patient currently is not medically stable to d/c.  O2 requirement is still increasing, on 9L today.   Consultants:    None  Procedures:  Antimicrobials:  Rocephin Azithromycin  Data Reviewed: I have personally reviewed following labs and imaging studies  CBC: Recent Labs  Lab 01/28/20 0602 01/30/20 0451  WBC 10.0 8.8  NEUTROABS 7.1  --   HGB 15.7* 12.4  HCT 48.5* 38.4  MCV 90.1 91.2  PLT 260 631   Basic Metabolic Panel: Recent Labs  Lab 01/28/20 0602 01/30/20 0451  NA 138 139  K 3.8 4.2  CL 106 107  CO2 23 26  GLUCOSE 155* 126*  BUN 16 27*  CREATININE 0.67 0.72  CALCIUM 9.9 9.7   GFR: Estimated Creatinine Clearance: 63.8 mL/min (by C-G formula based on SCr of 0.72 mg/dL). Liver Function Tests: Recent Labs  Lab 01/28/20 0602  AST 22  ALT 13  ALKPHOS 78  BILITOT 0.7  PROT 9.1*  ALBUMIN 4.6   No results for input(s): LIPASE, AMYLASE in the last 168 hours. No results for input(s): AMMONIA in the last 168 hours. Coagulation Profile: No results for input(s): INR, PROTIME in the last 168 hours. Cardiac Enzymes: No results for input(s): CKTOTAL, CKMB, CKMBINDEX, TROPONINI in the last 168 hours. BNP (last 3 results) No results for input(s): PROBNP in the last 8760 hours. HbA1C: No results for input(s): HGBA1C in the last 72 hours. CBG: No results for input(s): GLUCAP in the last 168 hours. Lipid Profile: No results for input(s): CHOL, HDL, LDLCALC, TRIG, CHOLHDL, LDLDIRECT in the last 72 hours. Thyroid Function Tests: No results for input(s): TSH, T4TOTAL, FREET4, T3FREE, THYROIDAB in the last 72 hours. Anemia Panel: No results for input(s): VITAMINB12, FOLATE, FERRITIN, TIBC, IRON, RETICCTPCT in the last 72 hours. Sepsis Labs: Recent Labs  Lab 01/28/20 0557 01/28/20 0845 01/29/20 1133  PROCALCITON  --   --  <0.10  LATICACIDVEN 2.0* 1.9  --     Recent Results (from the past 240 hour(s))  Respiratory Panel by RT PCR (Flu A&B, Covid) - Nasopharyngeal Swab     Status: None   Collection Time: 01/28/20  5:57 AM   Specimen: Nasopharyngeal Swab  Result Value Ref  Range Status   SARS Coronavirus 2 by RT PCR NEGATIVE NEGATIVE Final    Comment: (NOTE) SARS-CoV-2 target nucleic acids are NOT DETECTED. The SARS-CoV-2 RNA is generally detectable in upper respiratoy specimens during the acute phase of infection. The lowest concentration of SARS-CoV-2 viral copies this assay can detect is 131 copies/mL. A negative result does not preclude SARS-Cov-2 infection and should not be used as the sole basis for treatment or other patient management decisions. A negative result may occur with  improper specimen collection/handling, submission of specimen other than nasopharyngeal swab, presence of viral mutation(s) within the areas targeted by this assay, and inadequate number of viral copies (<131 copies/mL). A negative result must be combined with clinical observations, patient history, and epidemiological information. The expected result is Negative. Fact Sheet for Patients:  PinkCheek.be Fact  Sheet for Healthcare Providers:  GravelBags.it This test is not yet ap proved or cleared by the [place] FDA and  has been authorized for detection and/or diagnosis of SARS-CoV-2 by FDA under an Emergency Use Authorization (EUA). This EUA will remain  in effect (meaning this test can be used) for the duration of the COVID-19 declaration under Section 564(b)(1) of the Act, 21 U.S.C. section 360bbb-3(b)(1), unless the authorization is terminated or revoked sooner.    Influenza A by PCR NEGATIVE NEGATIVE Final   Influenza B by PCR NEGATIVE NEGATIVE Final    Comment: (NOTE) The Xpert Xpress SARS-CoV-2/FLU/RSV assay is intended as an aid in  the diagnosis of influenza from Nasopharyngeal swab specimens and  should not be used as a sole basis for treatment. Nasal washings and  aspirates are unacceptable for Xpert Xpress SARS-CoV-2/FLU/RSV  testing. Fact Sheet for  Patients: PinkCheek.be Fact Sheet for Healthcare Providers: GravelBags.it This test is not yet approved or cleared by the [place] FDA and  has been authorized for detection and/or diagnosis of SARS-CoV-2 by  FDA under an Emergency Use Authorization (EUA). This EUA will remain  in effect (meaning this test can be used) for the duration of the  Covid-19 declaration under Section 564(b)(1) of the Act, 21  U.S.C. section 360bbb-3(b)(1), unless the authorization is  terminated or revoked. Performed at [facility], [address] [place], [place] [address]   Culture, blood (routine x 2)     Status: None (Preliminary result)   Collection Time: 01/28/20  5:58 AM   Specimen: BLOOD  Result Value Ref Range Status   Specimen Description BLOOD LEFT ARM  Final   Special Requests   Final    BOTTLES DRAWN AEROBIC AND ANAEROBIC Blood Culture adequate volume   Culture   Final    NO GROWTH 2 DAYS Performed at [facility], [address] [place], [place] [address]    Report Status PENDING  Incomplete  Culture, blood (routine x 2)     Status: None (Preliminary result)   Collection Time: 01/28/20  5:58 AM   Specimen: BLOOD  Result Value Ref Range Status   Specimen Description BLOOD RIGHT HAND  Final   Special Requests   Final    BOTTLES DRAWN AEROBIC AND ANAEROBIC Blood Culture adequate volume   Culture   Final    NO GROWTH 2 DAYS Performed at [facility], [address], [place], [place] [address]    Report Status PENDING  Incomplete     Radiology Studies: No results found.  Scheduled Meds: . atorvastatin  20 mg Oral QHS  . azithromycin  500 mg Oral Daily  . Chlorhexidine Gluconate Cloth  6 each Topical Daily  . enoxaparin (LOVENOX) injection  40 mg Subcutaneous Q24H  . ipratropium-albuterol  3 mL Nebulization Q6H  . methylPREDNISolone (SOLU-MEDROL) injection  40 mg Intravenous Q12H    Continuous Infusions:    LOS: 2 days    Enzo Bi, MD [facility]  If 7PM-7AM, please contact night-coverage [email or web address]  01/30/2020, 6:39 PM   This record has been created using Dragon voice recognition software. Errors have been sought and corrected,but may not always be located. Such creation errors do not reflect on the standard of care.

## 2020-01-30 NOTE — Consult Note (Signed)
Consultation Note Date: 01/30/2020   Patient Name: Carol Gay  DOB: [birth year]  MRN: [id number]  Age / Sex: 74 y.o., female  PCP: Glean Hess, MD Referring Physician: Enzo Bi, MD  Reason for Consultation: Establishing goals of care  HPI/Patient Profile: Carol Gay is a 74 y.o. female with medical history significant of COPD on 3 L oxygen, hypertension, hyperlipidemia, asthma, CAD, solitary kidney (congenital absence of left kidney), anxiety, colon cancer, aortic stenosis, former smoker, who presents with shortness of breath.  Clinical Assessment and Goals of Care: Patient is resting in bed. She lives alone and was widowed in 2003. She has 3 children that live out of state.   She states at home, prior to this exacerbation, her QOL was acceptable and she states "I do okay." She states her time is spent in the chair and the bed; she is ambulatory around the house without assistance. She states she has a nurse to come out to be there when she takes a bath, in case she falls. No issues with appetite.    We discussed her diagnosis, prognosis, GOC, EOL wishes disposition and options.  A detailed discussion was had today regarding advanced directives.  Concepts specific to code status, artifical feeding and hydration, IV antibiotics and rehospitalization were discussed.  The difference between an aggressive medical intervention path and a comfort care path was discussed.  Values and goals of care important to patient and family were attempted to be elicited.  Discussed limitations of medical interventions to prolong quality of life in some situations and discussed the concept of human mortality.  She states her children are very much aware of her wishes on health care as well as funeral arrangements. She confirms DNR/DNI. She states she would like to continue to treat the treatable. She states she  wants to return to the hospital if needed. She states if a time comes she is no longer able to perform ADL's independently, she will no longer consider that an adequate QOL, and would want to transition to hospice care and a comfort focus.        SUMMARY OF RECOMMENDATIONS    Recommend palliative at D/C.   Prognosis:   Unable to determine       Primary Diagnoses: Present on Admission: . COPD exacerbation ([place]) . CAD in native artery . Dyslipidemia . Essential (primary) hypertension . Acute on chronic respiratory failure with hypoxia ([place]) . Anxiety   I have reviewed the medical record, interviewed the patient and family, and examined the patient. The following aspects are pertinent.  Past Medical History:  Diagnosis Date  . Aortic atherosclerosis ([place])   . Arm pain   . Asthma   . CAD in native artery   . Cancer ([place]) 09/2015   rectum  . Cigarette smoker   . Claustrophobia   . Colon cancer ([place])   . 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 COPD (chronic obstructive pulmonary disease) ([place])   .  DDD (degenerative disc disease), lumbar       . Dermatophytoses   . Dyslipidemia   . H/O blood clots    left leg, veins stripped  . Hemorrhoids   . Hypertension   . Last menstrual period (LMP) > 10 days ago 1994  . Myocardial infarction [facility])    "mild" - age 45  . Personal history of chemotherapy   . Personal history of radiation therapy   . Pulmonary emphysema ([place])   . Rectal bleeding   . Shortness of breath dyspnea   . Smokers' cough ([place])   . Spinal headache    with one of my children's birth  . Vertigo   . Wears dentures    has full upper and lower - doesn't wear   Social History   Socioeconomic History  . Marital status: Widowed    Spouse name: Not on file  . Number of children: Not on file  . Years of education: Not on file  . Highest education level: Not on file  Occupational History  . Not on file  Tobacco Use  .  Smoking status: Former Smoker    Packs/day: 1.00    Years: 57.00    Pack years: 57.00    Types: Cigarettes    Quit date: 03/30/2016    Years since quitting: 3.8  . Smokeless tobacco: Never Used  . Tobacco comment: Quit in July 2017  Substance and Sexual Activity  . Alcohol use: No    Alcohol/week: 0.0 standard drinks  . Drug use: No  . Sexual activity: Not Currently  Other Topics Concern  . Not on file  Social History Narrative  . Not on file   Social Determinants of Health   Financial Resource Strain: Low Risk   . Difficulty of Paying Living Expenses: Not very hard  Food Insecurity: No Food Insecurity  . Worried About Charity fundraiser in the Last Year: Never true  . Ran Out of Food in the Last Year: Never true  Transportation Needs: No Transportation Needs  . Lack of Transportation (Medical): No  . Lack of Transportation (Non-Medical): No  Physical Activity: Inactive  . Days of Exercise per Week: 0 days  . Minutes of Exercise per Session: 0 min  Stress: Stress Concern Present  . Feeling of Stress : To some extent  Social Connections: Unknown  . Frequency of Communication with Friends and Family: Patient refused  . Frequency of Social Gatherings with Friends and Family: Patient refused  . Attends Religious Services: Patient refused  . Active Member of Clubs or Organizations: Patient refused  . Attends Archivist Meetings: Patient refused  . Marital Status: Widowed   Family History  Problem Relation Age of Onset  . Cancer Mother        lymphoma  . Heart disease Mother   . Cancer Maternal Grandmother        breast  . Birth defects Maternal Grandmother    Scheduled Meds: . atorvastatin  20 mg Oral QHS  . azithromycin  500 mg Oral Daily  . Chlorhexidine Gluconate Cloth  6 each Topical Daily  . enoxaparin (LOVENOX) injection  40 mg Subcutaneous Q24H  . ipratropium-albuterol  3 mL Nebulization Q4H  . methylPREDNISolone (SOLU-MEDROL) injection  40 mg  Intravenous Q12H   Continuous Infusions: PRN Meds:.acetaminophen, albuterol, ALPRAZolam, alum & mag hydroxide-simeth, dextromethorphan-guaiFENesin, hydrALAZINE, hydrOXYzine, ondansetron (ZOFRAN) IV Medications Prior to Admission:  Prior to Admission medications   Medication Sig Start Date End Date  Taking? Authorizing Provider  acetaminophen (TYLENOL) 500 MG tablet Take 1,000 mg by mouth every 6 (six) hours as needed.   Yes [provider]  albuterol (PROVENTIL HFA;VENTOLIN HFA) 108 (90 Base) MCG/ACT inhaler INHALE 2 PUFFS INTO THE LUNGS EVERY 6 HOURS AS NEEDED FOR WHEEZING OR SHORTNESS OF BREATH 01/17/18  Yes Glean Hess, MD  amLODipine (NORVASC) 5 MG tablet TAKE 1 TABLET EVERY DAY 07/16/19  Yes Glean Hess, MD  atorvastatin (LIPITOR) 20 MG tablet TAKE 1 TABLET AT BEDTIME 12/31/19  Yes Glean Hess, MD  hydrOXYzine (ATARAX/VISTARIL) 25 MG tablet TAKE 1 TABLET TWICE DAILY AS NEEDED 11/12/19  Yes Glean Hess, MD  ipratropium-albuterol (DUONEB) 0.5-2.5 (3) MG/3ML SOLN Take 3 mLs by nebulization 3 (three) times daily. 12/30/17  Yes Laverle Hobby, MD  Potassium 99 MG TABS Take by mouth.   Yes [provider]  TRELEGY ELLIPTA 100-62.5-25 MCG/INH AEPB INHALE 1 PUFF INTO THE LUNGS DAILY. 11/12/19  Yes Glean Hess, MD   Allergies  Allergen Reactions  . Aspirin Nausea And Vomiting  . Codeine Nausea And Vomiting  . Morphine And Related Nausea And Vomiting  . Cat Hair Extract Other (See Comments)    Runny nose, watery eyes   Review of Systems  Respiratory: Positive for shortness of breath.     Physical Exam Pulmonary:     Effort: Pulmonary effort is normal.     Comments: Dyspnea and coughing with laughing.  Neurological:     Mental Status: She is alert.     Vital Signs: BP 123/60 (BP Location: Right Arm)   Pulse 80   Temp 98 F (36.7 C) (Oral)   Resp 16   Ht 5\' 4"  (1.626 m)   Wt 81.8 kg   SpO2 90%   BMI 30.95 kg/m  Pain Scale:  0-10   Pain Score: 5    SpO2: SpO2: 90 % O2 Device:SpO2: 90 % O2 Flow Rate: .O2 Flow Rate (L/min): 9 L/min  IO: Intake/output summary:   Intake/Output Summary (Last 24 hours) at 01/30/2020 0915 Last data filed at 01/30/2020 4765 Gross per 24 hour  Intake 720 ml  Output 1400 ml  Net -680 ml    LBM: Last BM Date: 01/28/20 Baseline Weight: Weight: 79.8 kg Most recent weight: Weight: 81.8 kg     Palliative Assessment/Data:     Time In: 3:25 Time Out: 4:10 Time Total: 45 min Greater than 50%  of this time was spent counseling and coordinating care related to the above assessment and plan.  Signed by: Asencion Gowda, NP   Please contact Palliative Medicine Team phone at [phone] for questions and concerns.  For individual provider: See Shea Evans

## 2020-01-30 NOTE — TOC Progression Note (Signed)
Transition of Care [facility]) - Progression Note    Patient Details  Name: Carol Gay MRN: [id number] Date of Birth: [birth year]  [facility]) CM/SW Contact  Eileen Stanford, LCSW Phone Number: 01/30/2020, 10:43 AM  Clinical Narrative:   Pt is agreeable to [facility]. Pt did not have a preference. Pt would appreciate any help she can get. CSW will reach out to see who can service pt.  [facility] is contracted with [employer] and can service pt-- pt is aware and agreeable.    Expected Discharge Plan: [place] Barriers to Discharge: Continued Medical Work up  Expected Discharge Plan and Services Expected Discharge Plan: [place]   Discharge Planning Services: CM Consult   Living arrangements for the past 2 months: Single Family Home                   DME Agency: [employer]     Representative spoke with at DME Agency: Leroy Sea to inform of Oxygen issues             Social Determinants of Health (SDOH) Interventions    Readmission Risk Interventions No flowsheet data found.

## 2020-01-31 LAB — MAGNESIUM: Magnesium: 2.6 mg/dL — ABNORMAL HIGH (ref 1.7–2.4)

## 2020-01-31 LAB — CBC
HCT: 41.3 % (ref 36.0–46.0)
Hemoglobin: 13.5 g/dL (ref 12.0–15.0)
MCH: 29.9 pg (ref 26.0–34.0)
MCHC: 32.7 g/dL (ref 30.0–36.0)
MCV: 91.4 fL (ref 80.0–100.0)
Platelets: 187 10*3/uL (ref 150–400)
RBC: 4.52 MIL/uL (ref 3.87–5.11)
RDW: 15.5 % (ref 11.5–15.5)
WBC: 7.4 10*3/uL (ref 4.0–10.5)
nRBC: 0 % (ref 0.0–0.2)

## 2020-01-31 LAB — BASIC METABOLIC PANEL
Anion gap: 6 (ref 5–15)
BUN: 32 mg/dL — ABNORMAL HIGH (ref 8–23)
CO2: 27 mmol/L (ref 22–32)
Calcium: 9.7 mg/dL (ref 8.9–10.3)
Chloride: 104 mmol/L (ref 98–111)
Creatinine, Ser: 0.7 mg/dL (ref 0.44–1.00)
GFR calc Af Amer: >60 mL/min (ref >60)
GFR calc non Af Amer: >60 mL/min (ref >60)
Glucose, Bld: 117 mg/dL — ABNORMAL HIGH (ref 70–99)
Potassium: 4.5 mmol/L (ref 3.5–5.1)
Sodium: 137 mmol/L (ref 135–145)

## 2020-01-31 MED ORDER — LOPERAMIDE HCL 2 MG PO CAPS
2.0000 mg | ORAL_CAPSULE | ORAL | Status: DC | PRN
  Administered 2020-01-31 (×2): 2 mg via ORAL
  Filled 2020-01-31 (×2): qty 1

## 2020-01-31 MED ORDER — SODIUM CHLORIDE 0.9% FLUSH
3.0000 mL | Freq: Two times a day (BID) | INTRAVENOUS | Status: DC
  Administered 2020-01-31 – 2020-02-03 (×6): 3 mL via INTRAVENOUS

## 2020-01-31 MED ORDER — CALCIUM CARBONATE ANTACID 500 MG PO CHEW
1.0000 | CHEWABLE_TABLET | Freq: Two times a day (BID) | ORAL | Status: DC | PRN
  Administered 2020-02-01 – 2020-02-02 (×4): 200 mg via ORAL
  Filled 2020-01-31 (×4): qty 1

## 2020-01-31 NOTE — Progress Notes (Signed)
Daily Progress Note   Patient Name: Carol Gay       Date: 01/31/2020 DOB: 1946-01-28  Age: 74 y.o. MRN#: [id number] Attending Physician: Enzo Bi, MD Primary Care Physician: Glean Hess, MD Admit Date: 01/28/2020  Reason for Consultation/Follow-up: Establishing goals of care  Subjective: Patient is resting in bed on 8.5 lpm of O2. She states when she tries to get up to use bedside toilet or sits on the side of the bed, she has to stop to catch her breath. She states she breathes well "if I sit perfectly still and don't move." She states for years she has had to stop and catch her breath with activity. She states as long as she can still take care of her self and her dog, she considers that acceptable QOL. She states her current functional status here in the hospital is not acceptable, and if  it does noes not improve from what it is currently, so that she can be active again, she would consider hospice care. She states her children know what she wants if she is unable to make decisions.             [place] Progression Recent Vital Signs   BP 135/76 (BP Location: Right Arm)   Pulse 80   Temp 97.6 F (36.4 C) (Oral)   Resp 14   Ht 5\' 4"  (1.626 m)   Wt 80.3 kg   SpO2 (!) 89%   BMI 30.38 kg/m    Past Medical History:  Diagnosis Date  . Aortic atherosclerosis ([place])   . Arm pain   . Asthma   . CAD in native artery   . Cancer ([place]) 09/2015   rectum  . Cigarette smoker   . Claustrophobia   . Colon cancer ([place])   . 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 COPD (chronic obstructive pulmonary disease) ([place])   . DDD (degenerative disc disease), lumbar       . Dermatophytoses   . Dyslipidemia   . H/O blood clots    left leg, veins stripped   . Hemorrhoids   . Hypertension   . Last menstrual period (LMP) > 10 days ago 1994  . Myocardial infarction [facility])    "mild" - age 53  .  Personal history of chemotherapy   . Personal history of radiation therapy   . Pulmonary emphysema ([place])   . Rectal bleeding   . Shortness of breath dyspnea   . Smokers' cough ([place])   . Spinal headache    with one of my children's birth  . Vertigo   . Wears dentures    has full upper and lower - doesn't wear     Expected Discharge Date     Diet Order            Diet Heart Room service appropriate? Yes; Fluid consistency: Thin  Diet effective now               VTE Documentation  Other (Comment)(lovenox)   Work Intensity Score/Level of Care  2  @LEVELOFCARE @   Mobility  Head of Bed Elevated : HOB 45 Assistive Device: Bedside Commode Transport method: Bed     Consult Orders  (From admission, onward)         Start     Ordered   01/29/20 1151  Consult to palliative care  Once    Provider:  (Not yet assigned)  Question Answer [place] [facility] Symptom Management Consult   Reason for Consult? Patient with advanced COPD, requiring higher levels of oxygen, lives alone.  See if she can qualify for hospice care so she can get some more help      01/29/20 1151   01/28/20 1428  PT eval and treat  Routine    Question:  Reason for PT?  Answer:  COPD   01/28/20 1427   01/28/20 1428  OT eval and treat  Routine    Question:  Reason for OT?  Answer:  COPD   01/28/20 1427   01/28/20 0806  Consult to respiratory care treatment  Once    Provider:  (Not yet assigned)  Question:  Reason for Consult?  Answer:  Educate patient regarding use of nebulizers and MDI   01/28/20 0807   01/28/20 0637  Consult to hospitalist  ALL PATIENTS BEING ADMITTED/HAVING PROCEDURES NEED COVID-19 SCREENING  Once    Comments: ALL PATIENTS BEING ADMITTED/HAVING PROCEDURES  NEED COVID-19 SCREENING  Provider:  (Not yet assigned)  Question Answer Comment  Place call to: hospitalist   Reason for Consult Admit   Diagnosis/Clinical Info for Consult: #[id number]      01/28/20 0636           Significant Events    DC Barriers   Abnormal Labs:  Asencion Gowda 01/31/2020, 1:34 PM lpm of O2.   Length of Stay: 3  Current Medications: Scheduled Meds:  . atorvastatin  20 mg Oral QHS  . azithromycin  500 mg Oral Daily  . Chlorhexidine Gluconate Cloth  6 each Topical Daily  . enoxaparin (LOVENOX) injection  40 mg Subcutaneous Q24H  . ipratropium-albuterol  3 mL Nebulization Q6H  . methylPREDNISolone (SOLU-MEDROL) injection  40 mg Intravenous Q12H    Continuous Infusions:   PRN Meds: acetaminophen, albuterol, ALPRAZolam, alum & mag hydroxide-simeth, dextromethorphan-guaiFENesin, hydrALAZINE, hydrOXYzine, loperamide, ondansetron (ZOFRAN) IV  Physical Exam Pulmonary:     Effort: Pulmonary effort is normal.  Neurological:     Mental Status: She is alert.             Vital Signs: BP 135/76 (BP Location: Right Arm)   Pulse 80   Temp 97.6 F (36.4 C) (Oral)   Resp 14   Ht  5\' 4"  (1.626 m)   Wt 80.3 kg   SpO2 (!) 89%   BMI 30.38 kg/m  SpO2: SpO2: (!) 89 % O2 Device: O2 Device: High Flow Nasal Cannula O2 Flow Rate: O2 Flow Rate (L/min): 8 L/min  Intake/output summary:   Intake/Output Summary (Last 24 hours) at 01/31/2020 1334 Last data filed at 01/31/2020 0950 Gross per 24 hour  Intake 480 ml  Output 2500 ml  Net -2020 ml   LBM: Last BM Date: 01/30/20 Baseline Weight: Weight: 79.8 kg Most recent weight: Weight: 80.3 kg       Palliative Assessment/Data: 40%      Patient Active Problem List   Diagnosis Date Noted  . COPD exacerbation ([place]) 01/28/2020  . Acute on chronic respiratory failure with hypoxia ([place]) 01/28/2020  . Anxiety 01/28/2020  . Dependence on continuous supplemental oxygen 12/28/2017  . Vitamin D deficiency 08/23/2017    . Age-related osteoporosis without current pathological fracture 08/22/2017  . Cyst of subcutaneous tissue 05/19/2017  . HPV (human papilloma virus) anogenital infection 07/07/2016  . Edema extremities 05/05/2016  . History of anal cancer 11/26/2015  . Blood in stool   . Benign neoplasm of transverse colon   . Benign neoplasm of rectosigmoid junction   . Fistula of intestine, excluding rectum and anus   . Pulmonary emphysema ([place]) 10/24/2015  . Encounter for long-term (current) use of medications 02/28/2015  . Aortic atherosclerosis ([place]) 12/30/2014  . CAD in native artery 12/30/2014  . DDD (degenerative disc disease), lumbar 12/30/2014  . Dyslipidemia 12/30/2014  . Essential (primary) hypertension 12/30/2014  . Dermatophytoses 12/30/2014  . Compulsive tobacco user syndrome 12/30/2014    Palliative Care Assessment & Plan    Recommendations/Plan:  Patient is not happy with current functional status during this exacerbation, but is hopeful she will improve back to full independence. Recommend palliative to follow outpatient with transition to hospice if patient chooses.    Code Status:    Code Status Orders  (From admission, onward)         Start     Ordered   01/28/20 1428  Do not attempt resuscitation (DNR)  Continuous    Question Answer Comment  In the event of cardiac or respiratory ARREST Do not call a "code blue"   In the event of cardiac or respiratory ARREST Do not perform Intubation, CPR, defibrillation or ACLS   In the event of cardiac or respiratory ARREST Use medication by any route, position, wound care, and other measures to relive pain and suffering. May use oxygen, suction and manual treatment of airway obstruction as needed for comfort.      01/28/20 1427        Code Status History    Date Active Date Inactive Code Status Order ID Comments User Context   01/28/2020 0607 01/28/2020 1427 DNR [id number]  Paulette Blanch, MD ED   02/08/[phone] 02/11/2016 1609  DNR [id number]  Harrie Foreman, MD Inpatient   Advance Care Planning Activity    Advance Directive Documentation     Most Recent Value  Type of Advance Directive  Healthcare Power of Attorney, Living will  Pre-existing out of facility DNR order (yellow form or pink MOST form)  --  "MOST" Form in Place?  --       Prognosis:  Poor overall   Thank you for allowing the Palliative Medicine Team to assist in the care of this patient.   Total Time 35 min Prolonged Time Billed  no  Greater than 50%  of this time was spent counseling and coordinating care related to the above assessment and plan.  Asencion Gowda, NP  Please contact Palliative Medicine Team phone at [phone] for questions and concerns.

## 2020-01-31 NOTE — Care Management Important Message (Signed)
Important Message  Patient Details  Name: Carol Gay MRN: [id number] Date of Birth: 1945/12/30   Medicare Important Message Given:  Yes   Initial Medicare IM given by Patient Access Associate on 01/30/2020 at 8:54am.     Dannette Barbara 01/31/2020, 11:57 AM

## 2020-01-31 NOTE — Progress Notes (Signed)
PROGRESS NOTE    [name]  UDJ:[id number] DOB: 1946/04/11 DOA: 01/28/2020 PCP: Glean Hess, MD   Brief Narrative:  Carol Gay is a 74 y.o. female with medical history significant of COPD on 3 L oxygen, hypertension, hyperlipidemia, asthma, CAD, solitary kidney (congenital absence of left kidney), anxiety, colon cancer, aortic stenosis, former smoker, who presents with shortness of breath.  Patient states that her shortness breath has been worsening since yesterday. She has cough, wheezing, progressively worsening shortness of breath.Found to be to be hypoxic requiring BiPAP initially, weaned off from BiPAP but continue to require more than her home oxygen.  Subjective: No new complaints.  Still desat with minimum movements or more talking.  No fever, chest pain, abdominal pain, N/V/D, dysuria.   Assessment & Plan:   Principal Problem:   COPD exacerbation ([place]) Active Problems:   CAD in native artery   Dyslipidemia   Essential (primary) hypertension   Acute on chronic respiratory failure with hypoxia (H[facility])   Anxiety  Acute on chronic respiratory failure with hypoxia due to COPD exacerbation. Patient with advanced COPD and should be on higher level of oxygen.  She was saturating between 88 to 90% on 7 L with HFNC, peaked at 9L, down to 8L today.   --Cultures negative, afebrile, no leukocytosis. --She was started on Rocephin and azithromycin due to some concern of pneumonia with her advanced lung disease.  Procal came back neg.  Rocephin d/c'ed. PLAN: -Continue azithromycin. -Continue BiPAP as needed -Solu-Medrol daily.  Scheduled Duo-Neb -Continue supplemental oxygen to keep saturation above 88%. -Palliative care consult as patient most likely will qualify for outpatient/in-home hospice care as she wants to remain independent and at home.  She needs some extra help at home as lives alone. --Aim to discharge pt with 6L O2.  CAD in native artery: No chest pain,  troponin remain negative. -Continue Lipitor.  Hypertension.  Blood pressure within goal. -Keep holding home dose of amlodipine which was held on admission due to softer blood pressure.  It can be restarted once blood pressure elevated.  Anxiety. -Continue as needed hydroxyzine.  Objective: Vitals:   01/31/20 0745 01/31/20 1136 01/31/20 1329 01/31/20 1622  BP:  135/76  132/69  Pulse: 73 83 80 78  Resp: 14 19 14 18   Temp:  97.6 F (36.4 C)    TempSrc:  Oral    SpO2: 93% 92% (!) 89% 93%  Weight:      Height:        Intake/Output Summary (Last 24 hours) at 01/31/2020 1735 Last data filed at 01/31/2020 1410 Gross per 24 hour  Intake 960 ml  Output 2600 ml  Net -1640 ml   Filed Weights   01/29/20 0534 01/30/20 0329 01/31/20 0517  Weight: 82 kg 81.8 kg 80.3 kg    Examination:  Constitutional: NAD, AAOx3 HEENT: conjunctivae and lids normal, EOMI CV: RRR no M,R,G. Distal pulses +2.  No cyanosis.   RESP: CTA B/L, no wheezes, normal respiratory effort, on 8L sating 89% GI: +BS, NTND Extremities: No effusions, edema, or tenderness in BLE SKIN: warm, dry and intact Neuro: II - XII grossly intact.  Sensation intact Psych: Normal mood and affect.  Appropriate judgement and reason    DVT prophylaxis: Lovenox Code Status: DNR Family Communication: Discussed with patient, she wants to talk with her sons herself. Disposition Plan:  Status is: Inpatient  Remains inpatient appropriate because:Inpatient level of care appropriate due to severity of illness   Dispo: The  patient is from: Home              Anticipated d/c is to: Home              Anticipated d/c date is: unknown when              Patient currently is not medically stable to d/c.  O2 requirement is still too high, at 8L today.  Will aim to discharge on 6L.   Consultants:   None  Procedures:  Antimicrobials:  Rocephin Azithromycin  Data Reviewed: I have personally reviewed following labs and imaging  studies  CBC: Recent Labs  Lab 01/28/20 0602 01/30/20 0451 01/31/20 0511  WBC 10.0 8.8 7.4  NEUTROABS 7.1  --   --   HGB 15.7* 12.4 13.5  HCT 48.5* 38.4 41.3  MCV 90.1 91.2 91.4  PLT 260 191 638   Basic Metabolic Panel: Recent Labs  Lab 01/28/20 0602 01/30/20 0451 01/31/20 0511  NA 138 139 137  K 3.8 4.2 4.5  CL 106 107 104  CO2 23 26 27   GLUCOSE 155* 126* 117*  BUN 16 27* 32*  CREATININE 0.67 0.72 0.70  CALCIUM 9.9 9.7 9.7  MG  --   --  2.6*   GFR: Estimated Creatinine Clearance: 63.2 mL/min (by C-G formula based on SCr of 0.7 mg/dL). Liver Function Tests: Recent Labs  Lab 01/28/20 0602  AST 22  ALT 13  ALKPHOS 78  BILITOT 0.7  PROT 9.1*  ALBUMIN 4.6   No results for input(s): LIPASE, AMYLASE in the last 168 hours. No results for input(s): AMMONIA in the last 168 hours. Coagulation Profile: No results for input(s): INR, PROTIME in the last 168 hours. Cardiac Enzymes: No results for input(s): CKTOTAL, CKMB, CKMBINDEX, TROPONINI in the last 168 hours. BNP (last 3 results) No results for input(s): PROBNP in the last 8760 hours. HbA1C: No results for input(s): HGBA1C in the last 72 hours. CBG: No results for input(s): GLUCAP in the last 168 hours. Lipid Profile: No results for input(s): CHOL, HDL, LDLCALC, TRIG, CHOLHDL, LDLDIRECT in the last 72 hours. Thyroid Function Tests: No results for input(s): TSH, T4TOTAL, FREET4, T3FREE, THYROIDAB in the last 72 hours. Anemia Panel: No results for input(s): VITAMINB12, FOLATE, FERRITIN, TIBC, IRON, RETICCTPCT in the last 72 hours. Sepsis Labs: Recent Labs  Lab 01/28/20 0557 01/28/20 0845 01/29/20 1133  PROCALCITON  --   --  <0.10  LATICACIDVEN 2.0* 1.9  --     Recent Results (from the past 240 hour(s))  Respiratory Panel by RT PCR (Flu A&B, Covid) - Nasopharyngeal Swab     Status: None   Collection Time: 01/28/20  5:57 AM   Specimen: Nasopharyngeal Swab  Result Value Ref Range Status   SARS  Coronavirus 2 by RT PCR NEGATIVE NEGATIVE Final    Comment: (NOTE) SARS-CoV-2 target nucleic acids are NOT DETECTED. The SARS-CoV-2 RNA is generally detectable in upper respiratoy specimens during the acute phase of infection. The lowest concentration of SARS-CoV-2 viral copies this assay can detect is 131 copies/mL. A negative result does not preclude SARS-Cov-2 infection and should not be used as the sole basis for treatment or other patient management decisions. A negative result may occur with  improper specimen collection/handling, submission of specimen other than nasopharyngeal swab, presence of viral mutation(s) within the areas targeted by this assay, and inadequate number of viral copies (<131 copies/mL). A negative result must be combined with clinical observations, patient history, and epidemiological information.  The expected result is Negative. Fact Sheet for Patients:  PinkCheek.be Fact Sheet for Healthcare Providers:  GravelBags.it This test is not yet ap proved or cleared by the [place] FDA and  has been authorized for detection and/or diagnosis of SARS-CoV-2 by FDA under an Emergency Use Authorization (EUA). This EUA will remain  in effect (meaning this test can be used) for the duration of the COVID-19 declaration under Section 564(b)(1) of the Act, 21 U.S.C. section 360bbb-3(b)(1), unless the authorization is terminated or revoked sooner.    Influenza A by PCR NEGATIVE NEGATIVE Final   Influenza B by PCR NEGATIVE NEGATIVE Final    Comment: (NOTE) The Xpert Xpress SARS-CoV-2/FLU/RSV assay is intended as an aid in  the diagnosis of influenza from Nasopharyngeal swab specimens and  should not be used as a sole basis for treatment. Nasal washings and  aspirates are unacceptable for Xpert Xpress SARS-CoV-2/FLU/RSV  testing. Fact Sheet for Patients: PinkCheek.be Fact Sheet  for Healthcare Providers: GravelBags.it This test is not yet approved or cleared by the [place] FDA and  has been authorized for detection and/or diagnosis of SARS-CoV-2 by  FDA under an Emergency Use Authorization (EUA). This EUA will remain  in effect (meaning this test can be used) for the duration of the  Covid-19 declaration under Section 564(b)(1) of the Act, 21  U.S.C. section 360bbb-3(b)(1), unless the authorization is  terminated or revoked. Performed at [facility], [address] [place], [place] [address]   Culture, blood (routine x 2)     Status: None (Preliminary result)   Collection Time: 01/28/20  5:58 AM   Specimen: BLOOD  Result Value Ref Range Status   Specimen Description BLOOD LEFT ARM  Final   Special Requests   Final    BOTTLES DRAWN AEROBIC AND ANAEROBIC Blood Culture adequate volume   Culture   Final    NO GROWTH 3 DAYS Performed at [facility], [address] [place], [place] [address]    Report Status PENDING  Incomplete  Culture, blood (routine x 2)     Status: None (Preliminary result)   Collection Time: 01/28/20  5:58 AM   Specimen: BLOOD  Result Value Ref Range Status   Specimen Description BLOOD RIGHT HAND  Final   Special Requests   Final    BOTTLES DRAWN AEROBIC AND ANAEROBIC Blood Culture adequate volume   Culture   Final    NO GROWTH 3 DAYS Performed at [facility], [address] [place], [place] [address]    Report Status PENDING  Incomplete     Radiology Studies: No results found.  Scheduled Meds: . atorvastatin  20 mg Oral QHS  . azithromycin  500 mg Oral Daily  . Chlorhexidine Gluconate Cloth  6 each Topical Daily  . enoxaparin (LOVENOX) injection  40 mg Subcutaneous Q24H  . ipratropium-albuterol  3 mL Nebulization Q6H  . methylPREDNISolone (SOLU-MEDROL) injection  40 mg Intravenous Q12H   Continuous Infusions:    LOS: 3 days    Enzo Bi, MD [facility]  If 7PM-7AM, please contact night-coverage [email or web address]  01/31/2020, 5:35 PM   This record has been created using Dragon voice recognition software. Errors have been sought and corrected,but may not always be located. Such creation errors do not reflect on the standard of care.

## 2020-01-31 NOTE — Progress Notes (Signed)
Occupational Therapy Treatment Patient Details Name: Carol Gay MRN: [id number] DOB: Sep 04, 1946 Today's Date: 01/31/2020    History of present illness Pt admitted for COPD exacerbation with complaints of SOB symptoms. HIstory includes COPD on 3L of O2, HTN, HLD, asthma, COPD, and colon cancer.   OT comments  Pt seen for OT tx. Pt agreeable to participation and reports improvement in SOB. Pt now on 8L O2, O2 sats >93% at rest. With transition to EOB, O2 dropped to mid 80's briefly. With cues to utilize pursed lip breathing and to allow more time to recover prior to engaging in significant conversation, pt recovers quickly to high 80's-low 90's SpO2. Pt sat EOB >15 minutes without significant drop in O2 while engaging in education/training focused on home/routines modifications to improve pt's toileting (trial use of BSC over toilet for increased elevation and decreased reliance on the sink and tub wall to pull herself up) and engagement in meaningful occupations including gardening (problem solving hose use, raised garden beds, AE). Pt verbalized understanding and eager to trial strategies once home. Pt progressing towards goals. Continue to recommend skilled OT services including [facility] services upon discharge to maximize pt's safety, independence, and minimize falls risk, functional decline, and increased caregiver burden and/or readmission.    Follow Up Recommendations  Home health OT    Equipment Recommendations  None recommended by OT    Recommendations for Other Services      Precautions / Restrictions Precautions Precautions: Fall Precaution Comments: monitor O2 sats Restrictions Weight Bearing Restrictions: No       Mobility Bed Mobility Overal bed mobility: Modified Independent             General bed mobility comments: mild drop in O2 sats with effort, able to improve within 1 min with verbal cue to use pursed lip breathing  Transfers                      Balance                                           ADL either performed or assessed with clinical judgement   ADL Overall ADL's : Modified independent                                       General ADL Comments: continues to requires min-mod verbal cues to utilize learned strategies to minimize SOB/over exertion and vital sign monitoring to improve pt's ability to participate     Vision Baseline Vision/History: Wears glasses Wears Glasses: Reading only Patient Visual Report: No change from baseline     Perception     Praxis      Cognition Arousal/Alertness: Awake/alert Behavior During Therapy: WFL for tasks assessed/performed Overall Cognitive Status: Within Functional Limits for tasks assessed                                          Exercises Other Exercises Other Exercises: Pt educated in home/routines modifications to improve pt's toileting (trial use of BSC over toilet for increased elevation and decreased reliance on the sink and tub wall to pull herself up) and engagement in meaningful occupations including gardening (problem  solving hose use, raised garden beds, AE)   Shoulder Instructions       General Comments      Pertinent Vitals/ Pain       Pain Assessment: No/denies pain  Home Living                                          Prior Functioning/Environment              Frequency  Min 1X/week        Progress Toward Goals  OT Goals(current goals can now be found in the care plan section)  Progress towards OT goals: Progressing toward goals  Acute Rehab OT Goals Patient Stated Goal: to walk around her house without SOB OT Goal Formulation: With patient Time For Goal Achievement: 02/12/20  Plan Discharge plan remains appropriate;Frequency remains appropriate    Co-evaluation                 AM-PAC OT "6 Clicks" Daily Activity     Outcome Measure   Help from  another person eating meals?: None Help from another person taking care of personal grooming?: None Help from another person toileting, which includes using toliet, bedpan, or urinal?: A Little Help from another person bathing (including washing, rinsing, drying)?: A Little Help from another person to put on and taking off regular upper body clothing?: None Help from another person to put on and taking off regular lower body clothing?: None 6 Click Score: 22    End of Session    OT Visit Diagnosis: Other abnormalities of gait and mobility (R26.89);Muscle weakness (generalized) (M62.81)   Activity Tolerance Patient tolerated treatment well   Patient Left in bed;with call bell/phone within reach;with bed alarm set   Nurse Communication          Time: 2778-2423 OT Time Calculation (min): 36 min  Charges: OT General Charges $OT Visit: 1 Visit OT Treatments $Self Care/Home Management : 23-37 mins  Jeni Salles, MPH, MS, OTR/L ascom [phone] 01/31/20, 3:40 PM

## 2020-02-01 LAB — BASIC METABOLIC PANEL
Anion gap: 6 (ref 5–15)
BUN: 38 mg/dL — ABNORMAL HIGH (ref 8–23)
CO2: 29 mmol/L (ref 22–32)
Calcium: 10 mg/dL (ref 8.9–10.3)
Chloride: 102 mmol/L (ref 98–111)
Creatinine, Ser: 1.05 mg/dL — ABNORMAL HIGH (ref 0.44–1.00)
GFR calc Af Amer: >60 mL/min (ref >60)
GFR calc non Af Amer: 52 mL/min — ABNORMAL LOW (ref >60)
Glucose, Bld: 130 mg/dL — ABNORMAL HIGH (ref 70–99)
Potassium: 5.3 mmol/L — ABNORMAL HIGH (ref 3.5–5.1)
Sodium: 137 mmol/L (ref 135–145)

## 2020-02-01 LAB — CBC
HCT: 43.3 % (ref 36.0–46.0)
Hemoglobin: 14.3 g/dL (ref 12.0–15.0)
MCH: 29.8 pg (ref 26.0–34.0)
MCHC: 33 g/dL (ref 30.0–36.0)
MCV: 90.2 fL (ref 80.0–100.0)
Platelets: 198 10*3/uL (ref 150–400)
RBC: 4.8 MIL/uL (ref 3.87–5.11)
RDW: 15.6 % — ABNORMAL HIGH (ref 11.5–15.5)
WBC: 6.9 10*3/uL (ref 4.0–10.5)
nRBC: 0 % (ref 0.0–0.2)

## 2020-02-01 LAB — MAGNESIUM: Magnesium: 2.4 mg/dL (ref 1.7–2.4)

## 2020-02-01 MED ORDER — PREDNISONE 20 MG PO TABS
40.0000 mg | ORAL_TABLET | Freq: Every day | ORAL | Status: DC
  Administered 2020-02-02 – 2020-02-03 (×2): 40 mg via ORAL
  Filled 2020-02-01 (×2): qty 2

## 2020-02-01 NOTE — TOC Progression Note (Signed)
Transition of Care [facility]) - Progression Note    Patient Details  Name: KARLINA SUARES MRN: [id number] Date of Birth: 12/02/1945  [facility] CM/SW Contact  Eileen Stanford, LCSW Phone Number: 02/01/2020, 11:44 AM  Clinical Narrative:   [facility] unable to service pt. However, [facility] will service pt. MD aware to put in orders.    Expected Discharge Plan: [place] Barriers to Discharge: Continued Medical Work up  Expected Discharge Plan and Services Expected Discharge Plan: [place]   Discharge Planning Services: CM Consult   Living arrangements for the past 2 months: Single Family Home                   DME Agency: [employer]     Representative spoke with at DME Agency: [facility] to inform of Oxygen issues             Social Determinants of Health (SDOH) Interventions    Readmission Risk Interventions No flowsheet data found.

## 2020-02-01 NOTE — Progress Notes (Signed)
PROGRESS NOTE    Carol Gay  UMP:[id number] DOB: [birth year] DOA: 01/28/2020 PCP: Glean Hess, MD   Brief Narrative:  Carol Gay is a 74 y.o. female with medical history significant of COPD on 3 L oxygen, hypertension, hyperlipidemia, asthma, CAD, solitary kidney (congenital absence of left kidney), anxiety, colon cancer, aortic stenosis, former smoker, who presents with shortness of breath.  Patient states that her shortness breath has been worsening since yesterday. She has cough, wheezing, progressively worsening shortness of breath.Found to be to be hypoxic requiring BiPAP initially, weaned off from BiPAP but continue to require more than her home oxygen.  Subjective: Pt had a surprisingly improvement in O2 requirement today, from 8L down to 5L, and pt reported having gotten up to use the commode.  No fever, chest pain, abdominal pain, N/V/D.   Assessment & Plan:   Principal Problem:   COPD exacerbation ([place]) Active Problems:   CAD in native artery   Dyslipidemia   Essential (primary) hypertension   Acute on chronic respiratory failure with hypoxia (HCC)   Anxiety  Acute on chronic respiratory failure with hypoxia due to COPD exacerbation. Patient with advanced COPD and should be on higher level of oxygen.  She was saturating between 88 to 90% on 7 L with HFNC, peaked at 9L, down to 5L today.   --Cultures negative, afebrile, no leukocytosis. --She was started on Rocephin and azithromycin due to some concern of pneumonia with her advanced lung disease.  Procal came back neg.  Rocephin d/c'ed. PLAN: -Continue azithromycin. -Continue BiPAP as needed -Solu-Medrol daily.  Scheduled Duo-Neb -Continue supplemental oxygen to keep saturation above 88%. -Palliative care consult as patient most likely will qualify for outpatient/in-home hospice care as she wants to remain independent and at home.  She needs some extra help at home as lives alone. --Aim to discharge pt  with 5-6L O2.  CAD in native artery: No chest pain, troponin remain negative. -Continue Lipitor.  Hypertension.  Blood pressure within goal. -Keep holding home dose of amlodipine which was held on admission due to softer blood pressure.  It can be restarted once blood pressure elevated.  Anxiety. -Continue as needed hydroxyzine.   Objective: Vitals:   02/01/20 0722 02/01/20 0750 02/01/20 1139 02/01/20 1518  BP:  (!) 140/59 139/76 131/65  Pulse:  72 80 84  Resp:  17 18 18   Temp:  (!) 97.5 F (36.4 C) (!) 97.4 F (36.3 C) 97.8 F (36.6 C)  TempSrc:  Oral Oral Oral  SpO2: 95% 94% 90% 90%  Weight:      Height:        Intake/Output Summary (Last 24 hours) at 02/01/2020 1615 Last data filed at 02/01/2020 1330 Gross per 24 hour  Intake 1200 ml  Output --  Net 1200 ml   Filed Weights   01/30/20 0329 01/31/20 0517 02/01/20 0442  Weight: 81.8 kg 80.3 kg 74.9 kg    Examination:  Constitutional: NAD, AAOx3 HEENT: conjunctivae and lids normal, EOMI CV: RRR no M,R,G. Distal pulses +2.  No cyanosis.   RESP: CTA B/L, no wheezes, normal respiratory effort, on 5L sating 89% GI: +BS, NTND Extremities: No effusions, edema, or tenderness in BLE SKIN: warm, dry and intact Neuro: II - XII grossly intact.  Sensation intact Psych: Normal mood and affect.  Appropriate judgement and reason    DVT prophylaxis: Lovenox Code Status: DNR Family Communication: Discussed with patient, she wants to talk with her sons herself. Disposition Plan:  Status is:  Inpatient  Remains inpatient appropriate because:Inpatient level of care appropriate due to severity of illness   Dispo: The patient is from: Home              Anticipated d/c is to: Home              Anticipated d/c date is: Sunday 5/16              Patient currently is not medically stable to d/c.  O2 requirement 5L at rest sating 89%, desat with any movement.  Pt needs some time to accommodate before going home.   Consultants:    None  Procedures:  Antimicrobials:  Rocephin Azithromycin  Data Reviewed: I have personally reviewed following labs and imaging studies  CBC: Recent Labs  Lab 01/28/20 0602 01/30/20 0451 01/31/20 0511 02/01/20 0434  WBC 10.0 8.8 7.4 6.9  NEUTROABS 7.1  --   --   --   HGB 15.7* 12.4 13.5 14.3  HCT 48.5* 38.4 41.3 43.3  MCV 90.1 91.2 91.4 90.2  PLT 260 191 187 568   Basic Metabolic Panel: Recent Labs  Lab 01/28/20 0602 01/30/20 0451 01/31/20 0511 02/01/20 0434  NA 138 139 137 137  K 3.8 4.2 4.5 5.3*  CL 106 107 104 102  CO2 23 26 27 29   GLUCOSE 155* 126* 117* 130*  BUN 16 27* 32* 38*  CREATININE 0.67 0.72 0.70 1.05*  CALCIUM 9.9 9.7 9.7 10.0  MG  --   --  2.6* 2.4   GFR: Estimated Creatinine Clearance: 46.6 mL/min (A) (by C-G formula based on SCr of 1.05 mg/dL (H)). Liver Function Tests: Recent Labs  Lab 01/28/20 0602  AST 22  ALT 13  ALKPHOS 78  BILITOT 0.7  PROT 9.1*  ALBUMIN 4.6   No results for input(s): LIPASE, AMYLASE in the last 168 hours. No results for input(s): AMMONIA in the last 168 hours. Coagulation Profile: No results for input(s): INR, PROTIME in the last 168 hours. Cardiac Enzymes: No results for input(s): CKTOTAL, CKMB, CKMBINDEX, TROPONINI in the last 168 hours. BNP (last 3 results) No results for input(s): PROBNP in the last 8760 hours. HbA1C: No results for input(s): HGBA1C in the last 72 hours. CBG: No results for input(s): GLUCAP in the last 168 hours. Lipid Profile: No results for input(s): CHOL, HDL, LDLCALC, TRIG, CHOLHDL, LDLDIRECT in the last 72 hours. Thyroid Function Tests: No results for input(s): TSH, T4TOTAL, FREET4, T3FREE, THYROIDAB in the last 72 hours. Anemia Panel: No results for input(s): VITAMINB12, FOLATE, FERRITIN, TIBC, IRON, RETICCTPCT in the last 72 hours. Sepsis Labs: Recent Labs  Lab 01/28/20 0557 01/28/20 0845 01/29/20 1133  PROCALCITON  --   --  <0.10  LATICACIDVEN 2.0* 1.9  --      Recent Results (from the past 240 hour(s))  Respiratory Panel by RT PCR (Flu A&B, Covid) - Nasopharyngeal Swab     Status: None   Collection Time: 01/28/20  5:57 AM   Specimen: Nasopharyngeal Swab  Result Value Ref Range Status   SARS Coronavirus 2 by RT PCR NEGATIVE NEGATIVE Final    Comment: (NOTE) SARS-CoV-2 target nucleic acids are NOT DETECTED. The SARS-CoV-2 RNA is generally detectable in upper respiratoy specimens during the acute phase of infection. The lowest concentration of SARS-CoV-2 viral copies this assay can detect is 131 copies/mL. A negative result does not preclude SARS-Cov-2 infection and should not be used as the sole basis for treatment or other patient management decisions. A negative result  may occur with  improper specimen collection/handling, submission of specimen other than nasopharyngeal swab, presence of viral mutation(s) within the areas targeted by this assay, and inadequate number of viral copies (<131 copies/mL). A negative result must be combined with clinical observations, patient history, and epidemiological information. The expected result is Negative. Fact Sheet for Patients:  PinkCheek.be Fact Sheet for Healthcare Providers:  GravelBags.it This test is not yet ap proved or cleared by the [place] FDA and  has been authorized for detection and/or diagnosis of SARS-CoV-2 by FDA under an Emergency Use Authorization (EUA). This EUA will remain  in effect (meaning this test can be used) for the duration of the COVID-19 declaration under Section 564(b)(1) of the Act, 21 U.S.C. section 360bbb-3(b)(1), unless the authorization is terminated or revoked sooner.    Influenza A by PCR NEGATIVE NEGATIVE Final   Influenza B by PCR NEGATIVE NEGATIVE Final    Comment: (NOTE) The Xpert Xpress SARS-CoV-2/FLU/RSV assay is intended as an aid in  the diagnosis of influenza from Nasopharyngeal  swab specimens and  should not be used as a sole basis for treatment. Nasal washings and  aspirates are unacceptable for Xpert Xpress SARS-CoV-2/FLU/RSV  testing. Fact Sheet for Patients: PinkCheek.be Fact Sheet for Healthcare Providers: GravelBags.it This test is not yet approved or cleared by the [place] FDA and  has been authorized for detection and/or diagnosis of SARS-CoV-2 by  FDA under an Emergency Use Authorization (EUA). This EUA will remain  in effect (meaning this test can be used) for the duration of the  Covid-19 declaration under Section 564(b)(1) of the Act, 21  U.S.C. section 360bbb-3(b)(1), unless the authorization is  terminated or revoked. Performed at [facility], [address] [place], [place] [address]   Culture, blood (routine x 2)     Status: None (Preliminary result)   Collection Time: 01/28/20  5:58 AM   Specimen: BLOOD  Result Value Ref Range Status   Specimen Description BLOOD LEFT ARM  Final   Special Requests   Final    BOTTLES DRAWN AEROBIC AND ANAEROBIC Blood Culture adequate volume   Culture   Final    NO GROWTH 4 DAYS Performed at [facility], [address], [place], [place] [address]    Report Status PENDING  Incomplete  Culture, blood (routine x 2)     Status: None (Preliminary result)   Collection Time: 01/28/20  5:58 AM   Specimen: BLOOD  Result Value Ref Range Status   Specimen Description BLOOD RIGHT HAND  Final   Special Requests   Final    BOTTLES DRAWN AEROBIC AND ANAEROBIC Blood Culture adequate volume   Culture   Final    NO GROWTH 4 DAYS Performed at [facility], [address], [place], [place] [address]    Report Status PENDING  Incomplete     Radiology Studies: No results found.  Scheduled Meds: . atorvastatin  20 mg Oral QHS  . Chlorhexidine Gluconate Cloth  6 each Topical Daily  . enoxaparin (LOVENOX) injection  40 mg  Subcutaneous Q24H  . ipratropium-albuterol  3 mL Nebulization Q6H  . [START ON 02/02/2020] predniSONE  40 mg Oral Q breakfast  . sodium chloride flush  3 mL Intravenous Q12H   Continuous Infusions:    LOS: 4 days    Enzo Bi, MD [facility]  If 7PM-7AM, please contact night-coverage [email or web address]  02/01/2020, 4:15 PM

## 2020-02-01 NOTE — Progress Notes (Signed)
Physical Therapy Treatment Patient Details Name: KYNNADI DICENSO MRN: [id number] DOB: [birth year] Today's Date: 02/01/2020    History of Present Illness Pt admitted for COPD exacerbation with complaints of SOB symptoms. HIstory includes COPD on 3L of O2, HTN, HLD, asthma, COPD, and colon cancer.    PT Comments    Pt in bed, resting 5 lpm 94%.  Stated she is feeling better overall.  She is able to complete bed mobility without assist.  Stands without assist but holds bed for support.  While she is educated on use of RW for support, balance and energy conservation she declines using one stating she does not at home but does have rollator if needed.  She seems to use touch assist with objects in her home as needed.  She is able to walk 15' in room with constant reaching for objects.  Has one LOB which is significant but she is able to recover on her own by grabbing end of bed.  Sats do decrease to 75% on 5 lpm with gait and limited by Probation officer.  She stated her baseline at home on 3 lpm is 80-85% and she often dips into 70's with gait.    She sits EOB for about 30 minutes during session before gait for education.  Education is difficult as pt is very talkative.  She often tells stories not related to conversation and despite cues to stop and breathe to increase sats she continues to talk.  Cues to deep breath are given.  Stated sats often decrease to 70's at home also while talking on the phone.    Discussed equipment.  She seems to have all she needs except a wheelchair.  It would benefit her to have one at home to allow for increased mobility and energy conservation in the home.  She does have a rollator that she uses as a wheelchair at times but for safety reasons a wheelchair is more appropriate.  She feels it would be helpful and would like to request one upon discharge.  Also encouraged her to use her rollator at home for overall safety with gait as her O2 sats/breathing allow.   Follow Up  Recommendations  Home health PT     Equipment Recommendations  Wheelchair (measurements PT)    Recommendations for Other Services       Precautions / Restrictions Precautions Precautions: Fall Precaution Comments: monitor O2 sats Restrictions Weight Bearing Restrictions: No    Mobility  Bed Mobility Overal bed mobility: Modified Independent                Transfers Overall transfer level: Modified independent   Transfers: Sit to/from Stand Sit to Stand: Supervision            Ambulation/Gait Ambulation/Gait assistance: Min guard;Min assist Gait Distance (Feet): 10 Feet Assistive device: None Gait Pattern/deviations: Step-through pattern;Decreased step length - right;Decreased step length - left Gait velocity: decreased   General Gait Details: one LOB where she caught herslef on end of bed.  refused RW   Stairs             Wheelchair Mobility    Modified Rankin (Stroke Patients Only)       Balance Overall balance assessment: Needs assistance Sitting-balance support: Feet supported Sitting balance-Leahy Scale: Normal     Standing balance support: No upper extremity supported Standing balance-Leahy Scale: Fair Standing balance comment: one LOB but she is able to recover on her own.  declined use of RW - education provided.  Used hand held on furniature at home.                            Cognition Arousal/Alertness: Awake/alert Behavior During Therapy: WFL for tasks assessed/performed Overall Cognitive Status: Within Functional Limits for tasks assessed                                        Exercises      General Comments        Pertinent Vitals/Pain Pain Assessment: No/denies pain    Home Living                      Prior Function            PT Goals (current goals can now be found in the care plan section) Progress towards PT goals: Progressing toward goals    Frequency    Min  2X/week      PT Plan Current plan remains appropriate    Co-evaluation              AM-PAC PT "6 Clicks" Mobility   Outcome Measure  Help needed turning from your back to your side while in a flat bed without using bedrails?: None Help needed moving from lying on your back to sitting on the side of a flat bed without using bedrails?: None Help needed moving to and from a bed to a chair (including a wheelchair)?: None Help needed standing up from a chair using your arms (e.g., wheelchair or bedside chair)?: None Help needed to walk in hospital room?: A Little Help needed climbing 3-5 steps with a railing? : A Little 6 Click Score: 22    End of Session Equipment Utilized During Treatment: Gait belt;Oxygen Activity Tolerance: Patient limited by fatigue;Patient tolerated treatment well Patient left: in bed;with bed alarm set Nurse Communication: Mobility status       Time: 9450-3888 PT Time Calculation (min) (ACUTE ONLY): 38 min  Charges:  $Gait Training: 8-22 mins $Therapeutic Exercise: 8-22 mins $Therapeutic Activity: 8-22 mins                     Chesley Noon, PTA 02/01/20, 11:08 AM

## 2020-02-01 NOTE — Care Management Important Message (Signed)
Important Message  Patient Details  Name: Carol Gay MRN: [id number] Date of Birth: [birth year]   Medicare Important Message Given:  Yes     Dannette Barbara 02/01/2020, 1:59 PM

## 2020-02-02 LAB — CULTURE, BLOOD (ROUTINE X 2)
Culture: NO GROWTH
Culture: NO GROWTH
Special Requests: ADEQUATE
Special Requests: ADEQUATE

## 2020-02-02 LAB — BASIC METABOLIC PANEL
Anion gap: 6 (ref 5–15)
BUN: 40 mg/dL — ABNORMAL HIGH (ref 8–23)
CO2: 31 mmol/L (ref 22–32)
Calcium: 9.9 mg/dL (ref 8.9–10.3)
Chloride: 100 mmol/L (ref 98–111)
Creatinine, Ser: 0.89 mg/dL (ref 0.44–1.00)
GFR calc Af Amer: >60 mL/min (ref >60)
GFR calc non Af Amer: >60 mL/min (ref >60)
Glucose, Bld: 81 mg/dL (ref 70–99)
Potassium: 4.4 mmol/L (ref 3.5–5.1)
Sodium: 137 mmol/L (ref 135–145)

## 2020-02-02 LAB — CBC
HCT: 43.9 % (ref 36.0–46.0)
Hemoglobin: 14.7 g/dL (ref 12.0–15.0)
MCH: 29.8 pg (ref 26.0–34.0)
MCHC: 33.5 g/dL (ref 30.0–36.0)
MCV: 89 fL (ref 80.0–100.0)
Platelets: 209 10*3/uL (ref 150–400)
RBC: 4.93 MIL/uL (ref 3.87–5.11)
RDW: 15.3 % (ref 11.5–15.5)
WBC: 7.4 10*3/uL (ref 4.0–10.5)
nRBC: 0 % (ref 0.0–0.2)

## 2020-02-02 LAB — MAGNESIUM: Magnesium: 2.3 mg/dL (ref 1.7–2.4)

## 2020-02-02 MED ORDER — CYCLOBENZAPRINE HCL 10 MG PO TABS
10.0000 mg | ORAL_TABLET | Freq: Two times a day (BID) | ORAL | Status: DC
  Administered 2020-02-02 – 2020-02-03 (×2): 10 mg via ORAL
  Filled 2020-02-02 (×2): qty 1

## 2020-02-02 NOTE — Progress Notes (Signed)
PROGRESS NOTE    Carol Gay  SHF:[id number] DOB: [birth year] DOA: 01/28/2020 PCP: Glean Hess, MD   Brief Narrative:  Carol Gay is a 74 y.o. female with medical history significant of COPD on 3 L oxygen, hypertension, hyperlipidemia, asthma, CAD, solitary kidney (congenital absence of left kidney), anxiety, colon cancer, aortic stenosis, former smoker, who presents with shortness of breath.  Patient states that her shortness breath has been worsening since yesterday. She has cough, wheezing, progressively worsening shortness of breath.Found to be to be hypoxic requiring BiPAP initially, weaned off from BiPAP but continue to require more than her home oxygen.  Subjective: Pt complained of cramps from the bottom of her feet traveling up to her lower legs.  No fever, chest pain, abdominal pain, N/V, dysuria.  Having some diarrhea.  O2 requirement remained stable at 5L.   Assessment & Plan:   Principal Problem:   COPD exacerbation ([place]) Active Problems:   CAD in native artery   Dyslipidemia   Essential (primary) hypertension   Acute on chronic respiratory failure with hypoxia (HCC)   Anxiety  Acute on chronic respiratory failure with hypoxia due to COPD exacerbation. Patient with advanced COPD and should be on higher level of oxygen.  She was saturating between 88 to 90% on 7 L with HFNC, peaked at 9L, down to 5L on 5/14..   --Cultures negative, afebrile, no leukocytosis. --She was started on Rocephin and azithromycin due to some concern of pneumonia with her advanced lung disease.  Procal came back neg.  Rocephin d/c'ed. --completed azithromycin PLAN: -Solu-Medrol tapered to prednisone 40 mg daily.  Scheduled Duo-Neb -Continue supplemental oxygen to keep saturation above 88%. -Palliative care consult as patient most likely will qualify for outpatient/in-home hospice care as she wants to remain independent and at home.  She needs some extra help at home as lives  alone. --Aim to discharge pt with 5-6L O2.  CAD in native artery: No chest pain, troponin remain negative. -Continue Lipitor.  Hypertension.  Blood pressure within goal. -Keep holding home dose of amlodipine which was held on admission due to softer blood pressure.  It can be restarted once blood pressure elevated.  Anxiety. -Continue as needed hydroxyzine.  Muscle cramps --start flexeril 10 mg BID   Objective: Vitals:   02/01/20 2300 02/02/20 0224 02/02/20 0451 02/02/20 0736  BP:   131/63   Pulse: 69  70 70  Resp: 12  20 18   Temp:   98 F (36.7 C)   TempSrc:   Oral   SpO2: (!) 89% 96% 97% 90%  Weight:   74.5 kg   Height:        Intake/Output Summary (Last 24 hours) at 02/02/2020 1432 Last data filed at 02/02/2020 1200 Gross per 24 hour  Intake 480 ml  Output 1000 ml  Net -520 ml   Filed Weights   01/31/20 0517 02/01/20 0442 02/02/20 0451  Weight: 80.3 kg 74.9 kg 74.5 kg    Examination:  Constitutional: NAD, AAOx3, sitting on bedside commode HEENT: conjunctivae and lids normal, EOMI CV: RRR no M,R,G. Distal pulses +2.  No cyanosis.   RESP: CTA B/L, no wheezes, normal respiratory effort, on 5L sating 89% GI: +BS, NTND Extremities: No effusions, edema, or tenderness in BLE SKIN: warm, dry and intact Neuro: II - XII grossly intact.  Sensation intact Psych: Normal mood and affect.  Appropriate judgement and reason    DVT prophylaxis: Lovenox Code Status: DNR Family Communication: Discussed with patient, she  wants to talk with her sons herself. Disposition Plan:  Status is: Inpatient  Remains inpatient appropriate because:Inpatient level of care appropriate due to severity of illness   Dispo: The patient is from: Home              Anticipated d/c is to: Home              Anticipated d/c date is: Sunday 5/16              Patient currently is not medically stable to d/c.  O2 requirement 5L at rest sating 89%, desat with any movement.  Pt needs some time to  accommodate before going home.   Consultants:   None  Procedures:  Antimicrobials:  Rocephin Azithromycin  Data Reviewed: I have personally reviewed following labs and imaging studies  CBC: Recent Labs  Lab 01/28/20 0602 01/30/20 0451 01/31/20 0511 02/01/20 0434 02/02/20 0431  WBC 10.0 8.8 7.4 6.9 7.4  NEUTROABS 7.1  --   --   --   --   HGB 15.7* 12.4 13.5 14.3 14.7  HCT 48.5* 38.4 41.3 43.3 43.9  MCV 90.1 91.2 91.4 90.2 89.0  PLT 260 191 187 198 376   Basic Metabolic Panel: Recent Labs  Lab 01/28/20 0602 01/30/20 0451 01/31/20 0511 02/01/20 0434 02/02/20 0431  NA 138 139 137 137 137  K 3.8 4.2 4.5 5.3* 4.4  CL 106 107 104 102 100  CO2 23 26 27 29 31   GLUCOSE 155* 126* 117* 130* 81  BUN 16 27* 32* 38* 40*  CREATININE 0.67 0.72 0.70 1.05* 0.89  CALCIUM 9.9 9.7 9.7 10.0 9.9  MG  --   --  2.6* 2.4 2.3   GFR: Estimated Creatinine Clearance: 54.8 mL/min (by C-G formula based on SCr of 0.89 mg/dL). Liver Function Tests: Recent Labs  Lab 01/28/20 0602  AST 22  ALT 13  ALKPHOS 78  BILITOT 0.7  PROT 9.1*  ALBUMIN 4.6   No results for input(s): LIPASE, AMYLASE in the last 168 hours. No results for input(s): AMMONIA in the last 168 hours. Coagulation Profile: No results for input(s): INR, PROTIME in the last 168 hours. Cardiac Enzymes: No results for input(s): CKTOTAL, CKMB, CKMBINDEX, TROPONINI in the last 168 hours. BNP (last 3 results) No results for input(s): PROBNP in the last 8760 hours. HbA1C: No results for input(s): HGBA1C in the last 72 hours. CBG: No results for input(s): GLUCAP in the last 168 hours. Lipid Profile: No results for input(s): CHOL, HDL, LDLCALC, TRIG, CHOLHDL, LDLDIRECT in the last 72 hours. Thyroid Function Tests: No results for input(s): TSH, T4TOTAL, FREET4, T3FREE, THYROIDAB in the last 72 hours. Anemia Panel: No results for input(s): VITAMINB12, FOLATE, FERRITIN, TIBC, IRON, RETICCTPCT in the last 72 hours. Sepsis  Labs: Recent Labs  Lab 01/28/20 0557 01/28/20 0845 01/29/20 1133  PROCALCITON  --   --  <0.10  LATICACIDVEN 2.0* 1.9  --     Recent Results (from the past 240 hour(s))  Respiratory Panel by RT PCR (Flu A&B, Covid) - Nasopharyngeal Swab     Status: None   Collection Time: 01/28/20  5:57 AM   Specimen: Nasopharyngeal Swab  Result Value Ref Range Status   SARS Coronavirus 2 by RT PCR NEGATIVE NEGATIVE Final    Comment: (NOTE) SARS-CoV-2 target nucleic acids are NOT DETECTED. The SARS-CoV-2 RNA is generally detectable in upper respiratoy specimens during the acute phase of infection. The lowest concentration of SARS-CoV-2 viral copies this assay can  detect is 131 copies/mL. A negative result does not preclude SARS-Cov-2 infection and should not be used as the sole basis for treatment or other patient management decisions. A negative result may occur with  improper specimen collection/handling, submission of specimen other than nasopharyngeal swab, presence of viral mutation(s) within the areas targeted by this assay, and inadequate number of viral copies (<131 copies/mL). A negative result must be combined with clinical observations, patient history, and epidemiological information. The expected result is Negative. Fact Sheet for Patients:  PinkCheek.be Fact Sheet for Healthcare Providers:  GravelBags.it This test is not yet ap proved or cleared by the [place] FDA and  has been authorized for detection and/or diagnosis of SARS-CoV-2 by FDA under an Emergency Use Authorization (EUA). This EUA will remain  in effect (meaning this test can be used) for the duration of the COVID-19 declaration under Section 564(b)(1) of the Act, 21 U.S.C. section 360bbb-3(b)(1), unless the authorization is terminated or revoked sooner.    Influenza A by PCR NEGATIVE NEGATIVE Final   Influenza B by PCR NEGATIVE NEGATIVE Final     Comment: (NOTE) The Xpert Xpress SARS-CoV-2/FLU/RSV assay is intended as an aid in  the diagnosis of influenza from Nasopharyngeal swab specimens and  should not be used as a sole basis for treatment. Nasal washings and  aspirates are unacceptable for Xpert Xpress SARS-CoV-2/FLU/RSV  testing. Fact Sheet for Patients: PinkCheek.be Fact Sheet for Healthcare Providers: GravelBags.it This test is not yet approved or cleared by the [place] FDA and  has been authorized for detection and/or diagnosis of SARS-CoV-2 by  FDA under an Emergency Use Authorization (EUA). This EUA will remain  in effect (meaning this test can be used) for the duration of the  Covid-19 declaration under Section 564(b)(1) of the Act, 21  U.S.C. section 360bbb-3(b)(1), unless the authorization is  terminated or revoked. Performed at [facility], [address] [place], [place] [address]   Culture, blood (routine x 2)     Status: None   Collection Time: 01/28/20  5:58 AM   Specimen: BLOOD  Result Value Ref Range Status   Specimen Description BLOOD LEFT ARM  Final   Special Requests   Final    BOTTLES DRAWN AEROBIC AND ANAEROBIC Blood Culture adequate volume   Culture   Final    NO GROWTH 5 DAYS Performed at [facility], [address] [place], [place] [address]    Report Status 02/02/2020 FINAL  Final  Culture, blood (routine x 2)     Status: None   Collection Time: 01/28/20  5:58 AM   Specimen: BLOOD  Result Value Ref Range Status   Specimen Description BLOOD RIGHT HAND  Final   Special Requests   Final    BOTTLES DRAWN AEROBIC AND ANAEROBIC Blood Culture adequate volume   Culture   Final    NO GROWTH 5 DAYS Performed at [facility], [address], [place], [place] [address]    Report Status 02/02/2020 FINAL  Final     Radiology Studies: No results found.  Scheduled Meds: . atorvastatin  20 mg Oral QHS   . Chlorhexidine Gluconate Cloth  6 each Topical Daily  . enoxaparin (LOVENOX) injection  40 mg Subcutaneous Q24H  . ipratropium-albuterol  3 mL Nebulization Q6H  . predniSONE  40 mg Oral Q breakfast  . sodium chloride flush  3 mL Intravenous Q12H   Continuous Infusions:    LOS: 5 days    Enzo Bi,  MD [facility]  If 7PM-7AM, please contact night-coverage [email or web address]  02/02/2020, 2:32 PM

## 2020-02-02 NOTE — Plan of Care (Signed)

## 2020-02-03 LAB — CBC
HCT: 47.1 % — ABNORMAL HIGH (ref 36.0–46.0)
Hemoglobin: 15.3 g/dL — ABNORMAL HIGH (ref 12.0–15.0)
MCH: 29.4 pg (ref 26.0–34.0)
MCHC: 32.5 g/dL (ref 30.0–36.0)
MCV: 90.6 fL (ref 80.0–100.0)
Platelets: 207 10*3/uL (ref 150–400)
RBC: 5.2 MIL/uL — ABNORMAL HIGH (ref 3.87–5.11)
RDW: 15.3 % (ref 11.5–15.5)
WBC: 8.3 10*3/uL (ref 4.0–10.5)
nRBC: 0 % (ref 0.0–0.2)

## 2020-02-03 LAB — BASIC METABOLIC PANEL
Anion gap: 9 (ref 5–15)
BUN: 41 mg/dL — ABNORMAL HIGH (ref 8–23)
CO2: 30 mmol/L (ref 22–32)
Calcium: 10.1 mg/dL (ref 8.9–10.3)
Chloride: 98 mmol/L (ref 98–111)
Creatinine, Ser: 1.07 mg/dL — ABNORMAL HIGH (ref 0.44–1.00)
GFR calc Af Amer: 59 mL/min — ABNORMAL LOW (ref >60)
GFR calc non Af Amer: 51 mL/min — ABNORMAL LOW (ref >60)
Glucose, Bld: 80 mg/dL (ref 70–99)
Potassium: 4.6 mmol/L (ref 3.5–5.1)
Sodium: 137 mmol/L (ref 135–145)

## 2020-02-03 LAB — MAGNESIUM: Magnesium: 2.1 mg/dL (ref 1.7–2.4)

## 2020-02-03 MED ORDER — ACETAMINOPHEN 500 MG PO TABS: 1000.0000 mg | ORAL_TABLET | Freq: Three times a day (TID) | ORAL | 0 refills | Status: AC | PRN

## 2020-02-03 MED ORDER — CYCLOBENZAPRINE HCL 10 MG PO TABS: 10.0000 mg | ORAL_TABLET | Freq: Two times a day (BID) | ORAL | 0 refills | Status: DC

## 2020-02-03 MED ORDER — IPRATROPIUM-ALBUTEROL 0.5-2.5 (3) MG/3ML IN SOLN: 3.0000 mL | Freq: Three times a day (TID) | RESPIRATORY_TRACT | 1 refills | Status: AC | PRN

## 2020-02-03 MED ORDER — ALBUTEROL SULFATE HFA 108 (90 BASE) MCG/ACT IN AERS: INHALATION_SPRAY | RESPIRATORY_TRACT | 4 refills | Status: AC

## 2020-02-03 MED ORDER — PREDNISONE 10 MG PO TABS: ORAL_TABLET | ORAL | 0 refills | Status: DC

## 2020-02-03 MED ORDER — CALCIUM CARBONATE ANTACID 500 MG PO CHEW: 1.0000 | CHEWABLE_TABLET | Freq: Three times a day (TID) | ORAL | Status: AC | PRN

## 2020-02-03 MED ORDER — CALCIUM CARBONATE ANTACID 500 MG PO CHEW
1.0000 | CHEWABLE_TABLET | Freq: Three times a day (TID) | ORAL | Status: DC | PRN
  Administered 2020-02-03: 200 mg via ORAL
  Filled 2020-02-03: qty 1

## 2020-02-03 NOTE — Discharge Summary (Signed)
Physician Discharge Summary   Carol Gay  female DOB: 1945-11-08  JXB:[id number]  PCP: Glean Hess, MD  Admit date: 01/28/2020 Discharge date: 02/03/2020  Admitted From: home Disposition:  home Home Health: Yes CODE STATUS: DNR  Discharge Instructions    Ambulatory referral to Pulmonology   Complete by: As directed    Reason for referral: Asthma/COPD   Diet - low sodium heart healthy   Complete by: As directed    Discharge instructions   Complete by: As directed    You are now needing 5L oxygen at rest.  Please continue to monitor your O2 saturation while up and about, and taking rests to let O2 saturation normalize as you have been doing at home before.  I have prescribed you a prednisone taper.  Please follow up instructions on the prescription.   Dr. Enzo Bi - -   Increase activity slowly   Complete by: As directed        Hospital Course:  For full details, please see H&P, progress notes, consult notes and ancillary notes.  Briefly,  Carol Gay a 74 y.o.Caucasian femalewith medical history significant ofCOPD on 3 L oxygen, hypertension, hyperlipidemia, asthma, CAD, solitary kidney (congenital absence of left kidney), anxiety, colon cancer, aortic stenosis, former smoker, who presented with shortness of breath.  # Acute on chronic respiratory failure with hypoxia due to COPD exacerbation # Chronic advanced COPD On presentation, pt was found to be to be hypoxic requiring BiPAP initially, weaned off from BiPAP but continue to require more than her home oxygen.  Pt's was saturating between 88 to 90% with HFNC, peaked at 9L, down to 5L on 5/14.  Cultures negative, afebrile, no leukocytosis.  She was started on Rocephin and azithromycin due to some concern of pneumonia with her advanced lung disease, however, Procal came back neg, so Rocephin d/c'ed.  Pt received steroid with solumedrol which was tapered to prednisone 40 mg daily prior to discharge,  and will finish prednisone taper at home.    Given pt's advanced COPD, her home O2 requirement was expected to increase.  Pt was weaned down to 5L and remained at 5L sating 89% at rest for 2 days, so was discharged on 5L O2.  Pt desat with exertion or ambulation but has developing coping mechanism at home where she monitors her O2 sat and takes frequent rests to let O2 sat to normalize before going onto the next activity.    Palliative care was consulted, however, pt wished to remain independent and at home as long as possible.  CAD in native artery:  No chest pain, troponin remain negative.  Continued Lipitor.  Hypertension.   home amlodipine was held on admission due to softer blood pressure, but resumed after discharge after BP became more elevated.  Anxiety. Continued as needed hydroxyzine.  Muscle cramps Started flexeril 10 mg BID which pt found helpful.   Discharge Diagnoses:  Principal Problem:   COPD exacerbation ([place]) Active Problems:   CAD in native artery   Dyslipidemia   Essential (primary) hypertension   Acute on chronic respiratory failure with hypoxia [facility])   Anxiety    Discharge Instructions:  Allergies as of 02/03/2020      Reactions   Aspirin Nausea And Vomiting   Codeine Nausea And Vomiting   Morphine And Related Nausea And Vomiting   Cat Hair Extract Other (See Comments)   Runny nose, watery eyes      Medication List    STOP  taking these medications   Potassium 99 MG Tabs     TAKE these medications   acetaminophen 500 MG tablet Commonly known as: TYLENOL Take 2 tablets (1,000 mg total) by mouth 3 (three) times daily as needed. What changed: when to take this   albuterol 108 (90 Base) MCG/ACT inhaler Commonly known as: VENTOLIN HFA INHALE 2 PUFFS INTO THE LUNGS EVERY 6 HOURS AS NEEDED FOR WHEEZING OR SHORTNESS OF BREATH   amLODipine 5 MG tablet Commonly known as: NORVASC TAKE 1 TABLET EVERY DAY   atorvastatin 20 MG tablet Commonly  known as: LIPITOR TAKE 1 TABLET AT BEDTIME   calcium carbonate 500 MG chewable tablet Commonly known as: TUMS - dosed in mg elemental calcium Chew 1 tablet (200 mg of elemental calcium total) by mouth 3 (three) times daily as needed for indigestion or heartburn.   cyclobenzaprine 10 MG tablet Commonly known as: FLEXERIL Take 1 tablet (10 mg total) by mouth 2 (two) times daily.   hydrOXYzine 25 MG tablet Commonly known as: ATARAX/VISTARIL TAKE 1 TABLET TWICE DAILY AS NEEDED   ipratropium-albuterol 0.5-2.5 (3) MG/3ML Soln Commonly known as: DUONEB Take 3 mLs by nebulization 3 (three) times daily as needed. What changed:   when to take this  reasons to take this   predniSONE 10 MG tablet Commonly known as: DELTASONE Take 40 mg (4 tablets) from 5/17 to 5/18, then 20 mg (2 tablets) from 5/19 to 5/22, then 10 mg (1 tablet) from 5/23 to 5/26, then done.   Trelegy Ellipta 100-62.5-25 MCG/INH Aepb Generic drug: Fluticasone-Umeclidin-Vilant INHALE 1 PUFF INTO THE LUNGS DAILY.            Durable Medical Equipment  (From admission, onward)         Start     Ordered   02/02/20 1619  For home use only DME oxygen  Once    Question Answer Comment  Length of Need Lifetime   Mode or (Route) Nasal cannula   Liters per Minute 5   Oxygen delivery system Gas      02/02/20 1618          Follow-up [place] Follow up.   Specialty: Home Health Services Why: Physical Therapy, and Occupational Therapy Contact information: [place] [address] [phone]        Ottie Glazier, MD. Schedule an appointment as soon as possible for a visit in 1 week(s).   Specialty: Pulmonary Disease Why: establish with new pulmonary doctor. Contact information: [place] [address] [phone]        Glean Hess, MD. Schedule an appointment as soon as possible for a visit in 1 week(s).     Specialty: Internal Medicine Contact information: [address] [place] [address] [phone]           Allergies  Allergen Reactions  . Aspirin Nausea And Vomiting  . Codeine Nausea And Vomiting  . Morphine And Related Nausea And Vomiting  . Cat Hair Extract Other (See Comments)    Runny nose, watery eyes     The results of significant diagnostics from this hospitalization (including imaging, microbiology, ancillary and laboratory) are listed below for reference.   Consultations:   Procedures/Studies: DG Chest Port 1 View  Result Date: 01/28/2020 CLINICAL DATA:  Shortness of breath. EXAM: PORTABLE CHEST 1 VIEW COMPARISON:  Chest x-ray dated July 25, 2018. FINDINGS: The patient is  rotated to the right. Unchanged right chest wall port catheter. The heart size and mediastinal contours are within normal limits. Normal pulmonary vascularity. The lungs remain hyperinflated with emphysematous changes. Bilateral lower lobe coarse interstitial and peribronchial thickening. Chronic subpleural reticulation the right upper lobe. No focal consolidation, pleural effusion, or pneumothorax. No acute osseous abnormality. IMPRESSION: COPD. Electronically Signed   By: Titus Dubin M.D.   On: 01/28/2020 07:01      Labs: BNP (last 3 results) No results for input(s): BNP in the last 8760 hours. Basic Metabolic Panel: Recent Labs  Lab 01/30/20 0451 01/31/20 0511 02/01/20 0434 02/02/20 0431 02/03/20 0534  NA 139 137 137 137 137  K 4.2 4.5 5.3* 4.4 4.6  CL 107 104 102 100 98  CO2 26 27 29 31 30   GLUCOSE 126* 117* 130* 81 80  BUN 27* 32* 38* 40* 41*  CREATININE 0.72 0.70 1.05* 0.89 1.07*  CALCIUM 9.7 9.7 10.0 9.9 10.1  MG  --  2.6* 2.4 2.3 2.1   Liver Function Tests: Recent Labs  Lab 01/28/20 0602  AST 22  ALT 13  ALKPHOS 78  BILITOT 0.7  PROT 9.1*  ALBUMIN 4.6   No results for input(s): LIPASE, AMYLASE in the last 168 hours. No results for input(s):  AMMONIA in the last 168 hours. CBC: Recent Labs  Lab 01/28/20 0602 01/28/20 0602 01/30/20 0451 01/31/20 0511 02/01/20 0434 02/02/20 0431 02/03/20 0534  WBC 10.0   < > 8.8 7.4 6.9 7.4 8.3  NEUTROABS 7.1  --   --   --   --   --   --   HGB 15.7*   < > 12.4 13.5 14.3 14.7 15.3*  HCT 48.5*   < > 38.4 41.3 43.3 43.9 47.1*  MCV 90.1   < > 91.2 91.4 90.2 89.0 90.6  PLT 260   < > 191 187 198 209 207   < > = values in this interval not displayed.   Cardiac Enzymes: No results for input(s): CKTOTAL, CKMB, CKMBINDEX, TROPONINI in the last 168 hours. BNP: Invalid input(s): POCBNP CBG: No results for input(s): GLUCAP in the last 168 hours. D-Dimer No results for input(s): DDIMER in the last 72 hours. Hgb A1c No results for input(s): HGBA1C in the last 72 hours. Lipid Profile No results for input(s): CHOL, HDL, LDLCALC, TRIG, CHOLHDL, LDLDIRECT in the last 72 hours. Thyroid function studies No results for input(s): TSH, T4TOTAL, T3FREE, THYROIDAB in the last 72 hours.  Invalid input(s): FREET3 Anemia work up No results for input(s): VITAMINB12, FOLATE, FERRITIN, TIBC, IRON, RETICCTPCT in the last 72 hours. Urinalysis    Component Value Date/Time   COLORURINE AMBER (A) 02/08/2016 0555   APPEARANCEUR CLEAR (A) 02/08/2016 0555   LABSPEC 1.031 (H) 02/08/2016 0555   PHURINE 5.0 02/08/2016 0555   GLUCOSEU NEGATIVE 02/08/2016 0555   HGBUR 3+ (A) 02/08/2016 0555   BILIRUBINUR NEGATIVE 02/08/2016 0555   BILIRUBINUR neg 09/02/2015 1116   KETONESUR TRACE (A) 02/08/2016 0555   PROTEINUR 100 (A) 02/08/2016 0555   UROBILINOGEN 0.2 09/02/2015 1116   NITRITE NEGATIVE 02/08/2016 0555   LEUKOCYTESUR NEGATIVE 02/08/2016 0555   Sepsis Labs Invalid input(s): PROCALCITONIN,  WBC,  LACTICIDVEN Microbiology Recent Results (from the past 240 hour(s))  Respiratory Panel by RT PCR (Flu A&B, Covid) - Nasopharyngeal Swab     Status: None   Collection Time: 01/28/20  5:57 AM   Specimen: Nasopharyngeal  Swab  Result Value Ref Range Status   SARS Coronavirus  2 by RT PCR NEGATIVE NEGATIVE Final    Comment: (NOTE) SARS-CoV-2 target nucleic acids are NOT DETECTED. The SARS-CoV-2 RNA is generally detectable in upper respiratoy specimens during the acute phase of infection. The lowest concentration of SARS-CoV-2 viral copies this assay can detect is 131 copies/mL. A negative result does not preclude SARS-Cov-2 infection and should not be used as the sole basis for treatment or other patient management decisions. A negative result may occur with  improper specimen collection/handling, submission of specimen other than nasopharyngeal swab, presence of viral mutation(s) within the areas targeted by this assay, and inadequate number of viral copies (<131 copies/mL). A negative result must be combined with clinical observations, patient history, and epidemiological information. The expected result is Negative. Fact Sheet for Patients:  PinkCheek.be Fact Sheet for Healthcare Providers:  GravelBags.it This test is not yet ap proved or cleared by the [place] FDA and  has been authorized for detection and/or diagnosis of SARS-CoV-2 by FDA under an Emergency Use Authorization (EUA). This EUA will remain  in effect (meaning this test can be used) for the duration of the COVID-19 declaration under Section 564(b)(1) of the Act, 21 U.S.C. section 360bbb-3(b)(1), unless the authorization is terminated or revoked sooner.    Influenza A by PCR NEGATIVE NEGATIVE Final   Influenza B by PCR NEGATIVE NEGATIVE Final    Comment: (NOTE) The Xpert Xpress SARS-CoV-2/FLU/RSV assay is intended as an aid in  the diagnosis of influenza from Nasopharyngeal swab specimens and  should not be used as a sole basis for treatment. Nasal washings and  aspirates are unacceptable for Xpert Xpress SARS-CoV-2/FLU/RSV  testing. Fact Sheet for  Patients: PinkCheek.be Fact Sheet for Healthcare Providers: GravelBags.it This test is not yet approved or cleared by the [place] FDA and  has been authorized for detection and/or diagnosis of SARS-CoV-2 by  FDA under an Emergency Use Authorization (EUA). This EUA will remain  in effect (meaning this test can be used) for the duration of the  Covid-19 declaration under Section 564(b)(1) of the Act, 21  U.S.C. section 360bbb-3(b)(1), unless the authorization is  terminated or revoked. Performed at [facility], [address] [place], [place] [address]   Culture, blood (routine x 2)     Status: None   Collection Time: 01/28/20  5:58 AM   Specimen: BLOOD  Result Value Ref Range Status   Specimen Description BLOOD LEFT ARM  Final   Special Requests   Final    BOTTLES DRAWN AEROBIC AND ANAEROBIC Blood Culture adequate volume   Culture   Final    NO GROWTH 5 DAYS Performed at [facility], [address], [place], [place] [address]    Report Status 02/02/2020 FINAL  Final  Culture, blood (routine x 2)     Status: None   Collection Time: 01/28/20  5:58 AM   Specimen: BLOOD  Result Value Ref Range Status   Specimen Description BLOOD RIGHT HAND  Final   Special Requests   Final    BOTTLES DRAWN AEROBIC AND ANAEROBIC Blood Culture adequate volume   Culture   Final    NO GROWTH 5 DAYS Performed at [facility], [address], [place],  [address]    Report Status 02/02/2020 FINAL  Final     Total time spend on discharging this patient, including the last patient exam, discussing the hospital stay, instructions for ongoing care as it relates to all pertinent caregivers, as well as preparing the medical  discharge records, prescriptions, and/or referrals as applicable, is 50 minutes.    Enzo Bi, MD  [facility] 02/03/2020, 10:33 AM  If 7PM-7AM, please contact  night-coverage

## 2020-02-03 NOTE — TOC Transition Note (Signed)
Transition of Care [facility]) - CM/SW Discharge Note   Patient Details  Name: Carol Gay MRN: [id number] Date of Birth: Jul 29, 1946  Transition of Care [facility]) CM/SW Contact:  Ova Freshwater Phone Number: [phone] 02/03/2020, 11:03 AM   Clinical Narrative:     Patient will receive 5L oxygen tank from [facility] [phone], they will deliver to the patient's home at 1:00PM.  This CSW spoke with the patient's son Herbie [place] [phone], he stated he will pick up the patient at 1:00PM. This CSW updated the MD/RN of status.    Barriers to Discharge: Continued Medical Work up   Patient Goals and CMS Choice        Discharge Placement                       Discharge Plan and Services   Discharge Planning Services: CM Consult              DME Agency: [employer]     Representative spoke with at DME Agency: Leroy Sea to inform of Oxygen issues            Social Determinants of Health (SDOH) Interventions     Readmission Risk Interventions No flowsheet data found.

## 2020-02-03 NOTE — Progress Notes (Signed)
Prior to bedtime, patient requested TUMS but the order only requires for her to have on two doses PRN. Offered maalox but patient refused for she states it makes her "gag". Therefore offered her last option which was Zofran. Patient also states a slight headache and needed something for pain. Administered tylenol. Tylenol, and the zofran were both effective. Report given to oncoming Nurse.

## 2020-02-04 ENCOUNTER — Telehealth: Payer: Self-pay

## 2020-02-04 NOTE — Telephone Encounter (Signed)
Transition Care Management Follow-up Telephone Call  Date of discharge and from where: 02/03/20 [facility]  How have you been since you were released from the hospital? Pt states she is doing okay just feels weak  Any questions or concerns? No  pt states her oxygen was increased to 5L continous O2. sats ranging from 88-92% but lower with exertion.   Items Reviewed:  Did the pt receive and understand the discharge instructions provided? Yes   Medications obtained and verified? Yes   Any new allergies since your discharge? No   Dietary orders reviewed? Yes  Do you have support at home? Yes   Functional Questionnaire: (I = Independent and D = Dependent) ADLs: D  Bathing/Dressing- D  Meal Prep- D  Eating- I  Maintaining continence- I  Transferring/Ambulation- I  Managing Meds- I  Follow up appointments reviewed:   PCP Hospital f/u appt confirmed? Yes  Scheduled to see Dr. Army Melia on 02/11/20 @ 3:00.  [facility] f/u appt confirmed? No  pt waiting to hear from pulmonology  Are transportation arrangements needed? Yes  pt will have to arrange transportation and someone to help with O2 tanks. Declined information about medical transportation.   If their condition worsens, is the pt aware to call PCP or go to the Emergency Dept.? Yes  Was the patient provided with contact information for the PCP's office or ED? Yes  Was to pt encouraged to call back with questions or concerns? Yes

## 2020-02-07 ENCOUNTER — Ambulatory Visit: Payer: Self-pay | Admitting: General Practice

## 2020-02-07 DIAGNOSIS — J441 Chronic obstructive pulmonary disease with (acute) exacerbation: Secondary | ICD-10-CM

## 2020-02-07 DIAGNOSIS — E785 Hyperlipidemia, unspecified: Secondary | ICD-10-CM

## 2020-02-07 DIAGNOSIS — Z9981 Dependence on supplemental oxygen: Secondary | ICD-10-CM

## 2020-02-07 DIAGNOSIS — I2510 Atherosclerotic heart disease of native coronary artery without angina pectoris: Secondary | ICD-10-CM

## 2020-02-07 DIAGNOSIS — I10 Essential (primary) hypertension: Secondary | ICD-10-CM

## 2020-02-07 NOTE — Patient Instructions (Signed)
Visit Information  Goals Addressed            This Visit's Progress    RNCM: Pt-"I don't have transportation to get to my appointments" (pt-stated)       [place] (see longitudinal plan of care for additional care plan information)  Current Barriers:   Knowledge Deficits related to how to obtain adequate transportation to get to MD appointments   Care Coordination needs related to transportation  in a patient with COPD, CAD, HTN, HLD (disease states)  Lacks caregiver support. Son lives in [place]. Neighbors help when can but are very Environmental health practitioner):   Over the next 60 days, patient will verbalize understanding of plan for working with the care guide team to get adequate transportation to her appointments   Over the next 5 days, patient will work with [facility], Care guides and pcp to address needs related to lack of transportation to appointments   Over the next 5 days, patient will work with care guides  (community agency) to get adequate transportation to the post discharge follow up visit at the pcp on 02/11/2020  Interventions:   Inter-disciplinary care team collaboration (see longitudinal plan of care)  Advised patient to work with CCM team to get adequate transportation to MD appointments   Provided education to patient re: care guide referral being placed to help with resources in [place] to help with transportation and other needs she may have   [place] referral for immediate need for transportation to her post discharge appointment on 02/11/2020 with Dr. Army Melia.   Patient Self Care Activities:   Patient verbalizes understanding of plan to work with the CCM team to meet transportation needs  Attends all scheduled provider appointments  Calls provider office for new concerns or questions  Unable to independently get to appointments due to inability to drive and not having adequate alternate  transportation at this time.   Lacks Scientist, product/process development  Initial goal documentation      RNCM: Pt-"They had to increase my oxygen to 5 liters" (pt-stated)       CARE PLAN ENTRY (see longtitudinal plan of care for additional care plan information)  Current Barriers:   Chronic Disease Management support, education, and care coordination needs related to CAD, HTN, HLD, and COPD  Lack of support system- son lives in [place]  Transportation barriers  NO blood pressure cuff to take blood pressure (has one ordered)  Clinical Goal(s) related to CAD, HTN, HLD, and COPD:  Over the next 120 days, patient will:   Work with the care management team to address educational, disease management, and care coordination needs   Begin or continue self health monitoring activities as directed today Measure and record blood pressure 2 times per week and adhere to a heart healthy diet  Call provider office for new or worsened signs and symptoms Blood pressure findings outside established parameters, Oxygen saturation lower than established parameter, Shortness of breath, and New or worsened symptom related to HLD/CAD and other chronic conditions   Call care management team with questions or concerns  Verbalize basic understanding of patient centered plan of care established today  Interventions related to CAD, HTN, HLD, and COPD:   Evaluation of current treatment plans and patient's adherence to plan as established by provider.  Post discharge assessment: Inpatient 01-28-2020 to 02-03-2020 for COPD exacerbation with shortness of breath. The patient was using 3.5 liters of oxygen via [place]  at home, she was discharged home on 5 liters of oxygen via [place].  The patient has a pulse oximetry and verbalized her oxygen saturations are at baseline. She is doing well and happy to be back home.   Assessed patient understanding of disease states.  Patient has a good understanding of chronic conditions. The patient lives  alone. Her son has moved to [place]. She does have neighbors that check on her.   Assessed patient's education and care coordination needs.  Patient needs reliable transportation to her appointments. Urgent referral to care guides placed for assistance with transportation.   Provided disease specific education to patient. Education on call pcp for changes or worsening condition.  Collaborated with appropriate clinical care team members regarding patient needs. Denies needs at this time from the LCSW or pharmacist. Endorses having medications and no other needs other than transportation at this time.   Patient Self Care Activities related to CAD, HTN, HLD, and COPD:   Patient is unable to independently self-manage chronic health conditions  Initial goal documentation        Ms. Hornback was given information about Care Management services today including:  1. Care Management services include personalized support from designated clinical staff supervised by her physician, including individualized plan of care and coordination with other care providers 2. 24/7 contact phone numbers for assistance for urgent and routine care needs. 3. The patient may stop CCM services at any time (effective at the end of the month) by phone call to the office staff.  Patient agreed to services and verbal consent obtained.   Patient verbalizes understanding of instructions provided today.   The care management team will reach out to the patient again over the next 30 to 60 days.   [name] RN, MSN, [place] Mobile: [phone]

## 2020-02-07 NOTE — Chronic Care Management (AMB) (Signed)
Care Management   Initial Visit Note  02/07/2020 Name: Carol Gay MRN: [id number] DOB: 1946/02/08  Subjective: "I don't have transportation to go to my appointments"  Objective: Post discharge from hospital 02-03-2020  Assessment: Carol Gay is a 74 y.o. year old female who sees Glean Hess, MD for primary care. The care management team was consulted for assistance with care management and care coordination needs related to Disease Management Educational Needs Care Coordination.   Review of patient status, including review of consultants reports, relevant laboratory and other test results, and collaboration with appropriate care team members and the patient's provider was performed as part of comprehensive patient evaluation and provision of care management services.    SDOH (Social Determinants of Health) assessments performed: Yes See Care Plan activities for detailed interventions related to SDOH)  SDOH Interventions     Most Recent Value  SDOH Interventions  SDOH Interventions for the Following Domains  Transportation, Physical Activity, Stress  Physical Activity Interventions  Other (Comments) [unable to do structured activity- wears oxygen 24/7]  Stress Interventions  Other (Comment) [stressed due to no transportation- C3 guide]  Transportation Interventions  Other (Comment) [Care guide referral for transportation needs- son moved to [place]       Outpatient Encounter Medications as of 02/07/2020  Medication Sig  . acetaminophen (TYLENOL) 500 MG tablet Take 2 tablets (1,000 mg total) by mouth 3 (three) times daily as needed.  Marland Kitchen albuterol (VENTOLIN HFA) 108 (90 Base) MCG/ACT inhaler INHALE 2 PUFFS INTO THE LUNGS EVERY 6 HOURS AS NEEDED FOR WHEEZING OR SHORTNESS OF BREATH  . amLODipine (NORVASC) 5 MG tablet TAKE 1 TABLET EVERY DAY  . atorvastatin (LIPITOR) 20 MG tablet TAKE 1 TABLET AT BEDTIME  . calcium carbonate (TUMS - DOSED IN MG ELEMENTAL CALCIUM) 500 MG  chewable tablet Chew 1 tablet (200 mg of elemental calcium total) by mouth 3 (three) times daily as needed for indigestion or heartburn.  . cyclobenzaprine (FLEXERIL) 10 MG tablet Take 1 tablet (10 mg total) by mouth 2 (two) times daily.  . hydrOXYzine (ATARAX/VISTARIL) 25 MG tablet TAKE 1 TABLET TWICE DAILY AS NEEDED  . ipratropium-albuterol (DUONEB) 0.5-2.5 (3) MG/3ML SOLN Take 3 mLs by nebulization 3 (three) times daily as needed.  . predniSONE (DELTASONE) 10 MG tablet Take 40 mg (4 tablets) from 5/17 to 5/18, then 20 mg (2 tablets) from 5/19 to 5/22, then 10 mg (1 tablet) from 5/23 to 5/26, then done.  . TRELEGY ELLIPTA 100-62.5-25 MCG/INH AEPB INHALE 1 PUFF INTO THE LUNGS DAILY.   Facility-Administered Encounter Medications as of 02/07/2020  Medication  . albuterol (PROVENTIL) (2.5 MG/3ML) 0.083% nebulizer solution 2.5 mg    Goals Addressed            This Visit's Progress   . RNCM: Pt-"I don't have transportation to get to my appointments" (pt-stated)       [place] (see longitudinal plan of care for additional care plan information)  Current Barriers:  Marland Kitchen Knowledge Deficits related to how to obtain adequate transportation to get to MD appointments  . Care Coordination needs related to transportation  in a patient with COPD, CAD, HTN, HLD (disease states) . Lacks caregiver support. Son lives in [place]. Neighbors help when can but are very busy  . Transportation barriers  Nurse Case Manager Clinical Goal(s):  Marland Kitchen Over the next 60 days, patient will verbalize understanding of plan for working with the care guide team to get adequate transportation to her  appointments  . Over the next 5 days, patient will work with [facility], Care guides and pcp to address needs related to lack of transportation to appointments  . Over the next 5 days, patient will work with care guides  (community agency) to get adequate transportation to the post discharge follow up visit at the pcp on  02/11/2020  Interventions:  . Inter-disciplinary care team collaboration (see longitudinal plan of care) . Advised patient to work with CCM team to get adequate transportation to MD appointments  . Provided education to patient re: care guide referral being placed to help with resources in [place] to help with transportation and other needs she may have  . Care Guide referral for immediate need for transportation to her post discharge appointment on 02/11/2020 with Dr. Army Melia.   Patient Self Care Activities:  . Patient verbalizes understanding of plan to work with the CCM team to meet transportation needs . Attends all scheduled provider appointments . Calls provider office for new concerns or questions . Unable to independently get to appointments due to inability to drive and not having adequate alternate transportation at this time.  [name] social connections  Initial goal documentation     . RNCM: Pt-"They had to increase my oxygen to 5 liters" (pt-stated)       CARE PLAN ENTRY (see longtitudinal plan of care for additional care plan information)  Current Barriers:  . Chronic Disease Management support, education, and care coordination needs related to CAD, HTN, HLD, and COPD . Lack of support system- son lives in [place] . Transportation barriers . NO blood pressure cuff to take blood pressure (has one ordered)  Clinical Goal(s) related to CAD, HTN, HLD, and COPD:  Over the next 120 days, patient will:  . Work with the care management team to address educational, disease management, and care coordination needs  . Begin or continue self health monitoring activities as directed today Measure and record blood pressure 2 times per week and adhere to a heart healthy diet . Call provider office for new or worsened signs and symptoms Blood pressure findings outside established parameters, Oxygen saturation lower than established parameter, Shortness of breath, and New or worsened  symptom related to HLD/CAD and other chronic conditions  . Call care management team with questions or concerns . Verbalize basic understanding of patient centered plan of care established today  Interventions related to CAD, HTN, HLD, and COPD:  . Evaluation of current treatment plans and patient's adherence to plan as established by provider.  Post discharge assessment: Inpatient 01-28-2020 to 02-03-2020 for COPD exacerbation with shortness of breath. The patient was using 3.5 liters of oxygen via [place] at home, she was discharged home on 5 liters of oxygen via .  The patient has a pulse oximetry and verbalized her oxygen saturations are at baseline. She is doing well and happy to be back home.  . Assessed patient understanding of disease states.  Patient has a good understanding of chronic conditions. The patient lives alone. Her son has moved to [place]. She does have neighbors that check on her.  . Assessed patient's education and care coordination needs.  Patient needs reliable transportation to her appointments. Urgent referral to care guides placed for assistance with transportation.  . Provided disease specific education to patient. Education on call pcp for changes or worsening condition. Carol Gay with appropriate clinical care team members regarding patient needs. Denies needs at this time from the LCSW or pharmacist. Endorses having medications  and no other needs other than transportation at this time.   Patient Self Care Activities related to CAD, HTN, HLD, and COPD:  . Patient is unable to independently self-manage chronic health conditions  Initial goal documentation         Follow up plan:  The care management team will reach out to the patient again over the next 30 to 60 days.   Ms. Dault was given information about Care Management services today including:  1. Care Management services include personalized support from designated clinical staff supervised by a physician,  including individualized plan of care and coordination with other care providers 2. 24/7 contact phone numbers for assistance for urgent and routine care needs. 3. The patient may stop Care Management services at any time (effective at the end of the month) by phone call to the office staff.  Patient agreed to services and verbal consent obtained.  [name] RN, MSN, [facility] Mobile: [phone]

## 2020-02-08 ENCOUNTER — Telehealth: Payer: Self-pay | Admitting: Internal Medicine

## 2020-02-08 DIAGNOSIS — J9611 Chronic respiratory failure with hypoxia: Secondary | ICD-10-CM | POA: Diagnosis not present

## 2020-02-08 DIAGNOSIS — I129 Hypertensive chronic kidney disease with stage 1 through stage 4 chronic kidney disease, or unspecified chronic kidney disease: Secondary | ICD-10-CM | POA: Diagnosis not present

## 2020-02-08 DIAGNOSIS — F329 Major depressive disorder, single episode, unspecified: Secondary | ICD-10-CM | POA: Diagnosis not present

## 2020-02-08 DIAGNOSIS — I350 Nonrheumatic aortic (valve) stenosis: Secondary | ICD-10-CM | POA: Diagnosis not present

## 2020-02-08 DIAGNOSIS — J439 Emphysema, unspecified: Secondary | ICD-10-CM | POA: Diagnosis not present

## 2020-02-08 DIAGNOSIS — E785 Hyperlipidemia, unspecified: Secondary | ICD-10-CM | POA: Diagnosis not present

## 2020-02-08 DIAGNOSIS — E559 Vitamin D deficiency, unspecified: Secondary | ICD-10-CM | POA: Diagnosis not present

## 2020-02-08 DIAGNOSIS — N189 Chronic kidney disease, unspecified: Secondary | ICD-10-CM | POA: Diagnosis not present

## 2020-02-08 DIAGNOSIS — I2510 Atherosclerotic heart disease of native coronary artery without angina pectoris: Secondary | ICD-10-CM | POA: Diagnosis not present

## 2020-02-08 NOTE — Telephone Encounter (Signed)
Email to pt  From: Jill Alexanders [facility])  Sent: Friday, Feb 08, 2020 1:18 PM To: '[email or web address]' [email or web address]> Subject: Secure: [place] Afternoon Ms. Shatswell, Thank you for speaking with me today regarding community resources for Transportation for [facility]. I have attached the information regarding L[employer] para transit should you need this as an option in the future.  Please let me know if you need anything further,  [email or web address]   [phone]

## 2020-02-08 NOTE — Telephone Encounter (Signed)
  Community Resource Referral   [place] 02/08/2020   DOB: 11-Dec-1945   AGE: 74 y.o.   GENDER: female   PCP Glean Hess, MD.   Called pt regarding Community Resource Referral regarding transportation to 5/24 visit Notes from [facility] Referral The patient has a post discharge appointment on May 24 to see Dr. Army Melia and needs transportation. No family. Usually drives herself but can't now. Son moved to [place]. Please call Discussed [employer] Medicare Advantage Benefit and she said she would need help getting oxygen tank as well as managing her walker or cane. Suggested [place] [employer] as they have door to door service and can help her get a wheelchair from [facility] when she arrives. Sent in referral to [facility] for Monday appt at 3pm with Dr. Army Melia.  [place] . [email or web address]  [phone]

## 2020-02-08 NOTE — Telephone Encounter (Signed)
Email to [facility]   From: Jill Alexanders [facility])  Sent: Friday, Feb 08, 2020 11:34 AM To: Transportation Services [email or web address]> Subject: Secure: [place] Transportation Trip Request: [id number] 02/11/20 Elliot Dally  Good Morning, Could you please arrange transportation for Ms. Roszak's appt to [facility] on 5/24?  Here is the information for Ms. Schweiger's Trip:  Monday, May 24th  3:00pm (2:45pm arrival time) Cost Center: [place] is Environmental education officer   Pick up:  Patient Name: Carol Gay Patient DOB:      10/08/1945 Patient Home Address/Pick up - Drop off location: [address], [place] [address] Patient Phone Number: [phone] Patient MRN:  [id number] Patient Insurance Information: [employer] Rossetti Medicare Date of Request: 02/08/20 Pick up Location: Home Cane/Walker/Wheelchair: Uses her Kasandra Knudsen and Gilford Rile but also will need Wheelchair when she arrives at [facility]  Drop Off: [facility] [address] Halina Maidens [address] [place],  [place]  [address] Phone: [phone]  Pick up from appt:  Appt Length is 40 minutes please schedule pick up at 3:45pm to return home to [place].   Additional Information: pt has an oxygen tank that she will need help with and will also need wheelchair when she gets to [facility].  Thank you,  [place] . [email or web address]  [phone]

## 2020-02-08 NOTE — Telephone Encounter (Signed)
  Community Resource Referral   [place] 02/08/2020  DOB: [birth year]   AGE: 74 y.o.   GENDER: female   PCP Glean Hess, MD.   Called pt regarding Community Resource Referral to let her know that [place] would not be able to assist her with her trip.  From: Transportation Services [email or web address]>  Sent: Friday, Feb 08, 2020 12:30 PM To: Jill Alexanders [facility]) @[email or web address]> Subject: RE: Secure: [place] Transportation Trip Request: [id number] 02/11/20 Elliot Dally  Hey,  Good afternoon,  Due to the assistance that Ms. Hockman needs, unfortunately our Transportation Program wont be able to assist. Our drivers can not enter a patients home.  I am sorry. If you have further questions feel free to call me at [phone] and ask for [place].    She stated that she has a housekeeper coming every other week to help her clean her home and has an in home aide that will be coming to help her bathe as well as a physical therapist. She has her food delivered to her home and stated she does not have anyone else to help her including neighbors.  She cancelled her appt with Dr. Army Melia and said she will come when she feels able to manage her oxygen tank and is getting a little bit better each day and building up her strength. Discussed Para Transit and I will email her the application to review although she believes she may not need that as she is expected to have her strength back in the coming weeks. Discussed [email or web address] $16 an hour for help getting to her appts but she cannot afford that. [employer] Medicare offers transportation but will not be able to enter her home to help with oxygen tank. Closing referral pending any other needs of patient. [place] . [email or web address]  [phone]

## 2020-02-11 ENCOUNTER — Telehealth: Payer: Self-pay | Admitting: Internal Medicine

## 2020-02-11 ENCOUNTER — Inpatient Hospital Stay: Payer: Medicare HMO | Admitting: Internal Medicine

## 2020-02-11 DIAGNOSIS — J9611 Chronic respiratory failure with hypoxia: Secondary | ICD-10-CM | POA: Diagnosis not present

## 2020-02-11 DIAGNOSIS — F329 Major depressive disorder, single episode, unspecified: Secondary | ICD-10-CM | POA: Diagnosis not present

## 2020-02-11 DIAGNOSIS — J439 Emphysema, unspecified: Secondary | ICD-10-CM | POA: Diagnosis not present

## 2020-02-11 DIAGNOSIS — I350 Nonrheumatic aortic (valve) stenosis: Secondary | ICD-10-CM | POA: Diagnosis not present

## 2020-02-11 DIAGNOSIS — E559 Vitamin D deficiency, unspecified: Secondary | ICD-10-CM | POA: Diagnosis not present

## 2020-02-11 DIAGNOSIS — I129 Hypertensive chronic kidney disease with stage 1 through stage 4 chronic kidney disease, or unspecified chronic kidney disease: Secondary | ICD-10-CM | POA: Diagnosis not present

## 2020-02-11 DIAGNOSIS — E785 Hyperlipidemia, unspecified: Secondary | ICD-10-CM | POA: Diagnosis not present

## 2020-02-11 DIAGNOSIS — I2510 Atherosclerotic heart disease of native coronary artery without angina pectoris: Secondary | ICD-10-CM | POA: Diagnosis not present

## 2020-02-11 DIAGNOSIS — N189 Chronic kidney disease, unspecified: Secondary | ICD-10-CM | POA: Diagnosis not present

## 2020-02-11 NOTE — Telephone Encounter (Signed)
Colletta [place], OT with [facility], calling to request verbal orders.    Requesting OT  Frequency: 2w2, 1w1  CB [phone] VM ok.

## 2020-02-11 NOTE — Telephone Encounter (Signed)
Verbal orders given to stephanie.

## 2020-02-14 LAB — BLOOD GAS, ARTERIAL
Acid-base deficit: 0.5 mmol/L (ref 0.0–2.0)
Bicarbonate: 24.2 mmol/L (ref 20.0–28.0)
Delivery systems: POSITIVE
Expiratory PAP: 6
FIO2: 1
Inspiratory PAP: 12
Mechanical Rate: 12
O2 Saturation: 99.9 %
Patient temperature: 37
RATE: 12 resp/min
pCO2 arterial: 39 mmHg (ref 32.0–48.0)
pH, Arterial: 7.4 (ref 7.350–7.450)
pO2, Arterial: 315 mmHg — ABNORMAL HIGH (ref 83.0–108.0)

## 2020-02-26 ENCOUNTER — Telehealth: Payer: Self-pay | Admitting: Internal Medicine

## 2020-02-26 NOTE — Telephone Encounter (Signed)
Copied from [place] [phone]. Topic: General - Inquiry >> Feb 26, 2020  1:00 PM Scherrie Gerlach wrote: Reason for CRM: Crystal with [facility] states pt told them she has these dx : 1.  Congestive heart failure 2.  Osteoarthritis 3.  Depression 4.  Vit D deficiency 5.  CRF But Crystal did not see these on her hospital dc papers. She needs to confirm - does pt have these things? Ok to leave message on her VM if she is with a pt. [phone]

## 2020-02-26 NOTE — Telephone Encounter (Signed)
Called and left her a VM stating patient does not have CHF, dies bit have osteoarthritis but does have osteoporosis. She does have depression and vit d def. She does not have CRF but does have very mild renal insufficiency.   CM

## 2020-02-27 ENCOUNTER — Other Ambulatory Visit: Payer: Self-pay | Admitting: Internal Medicine

## 2020-02-27 DIAGNOSIS — J9621 Acute and chronic respiratory failure with hypoxia: Secondary | ICD-10-CM

## 2020-02-27 DIAGNOSIS — N189 Chronic kidney disease, unspecified: Secondary | ICD-10-CM | POA: Diagnosis not present

## 2020-02-27 DIAGNOSIS — E785 Hyperlipidemia, unspecified: Secondary | ICD-10-CM | POA: Diagnosis not present

## 2020-02-27 DIAGNOSIS — I700 Atherosclerosis of aorta: Secondary | ICD-10-CM

## 2020-02-27 DIAGNOSIS — I129 Hypertensive chronic kidney disease with stage 1 through stage 4 chronic kidney disease, or unspecified chronic kidney disease: Secondary | ICD-10-CM | POA: Diagnosis not present

## 2020-02-27 DIAGNOSIS — J439 Emphysema, unspecified: Secondary | ICD-10-CM | POA: Diagnosis not present

## 2020-02-27 DIAGNOSIS — M5136 Other intervertebral disc degeneration, lumbar region: Secondary | ICD-10-CM

## 2020-02-27 DIAGNOSIS — I350 Nonrheumatic aortic (valve) stenosis: Secondary | ICD-10-CM | POA: Diagnosis not present

## 2020-02-27 DIAGNOSIS — E559 Vitamin D deficiency, unspecified: Secondary | ICD-10-CM | POA: Diagnosis not present

## 2020-02-27 DIAGNOSIS — J438 Other emphysema: Secondary | ICD-10-CM

## 2020-02-27 DIAGNOSIS — J9611 Chronic respiratory failure with hypoxia: Secondary | ICD-10-CM | POA: Diagnosis not present

## 2020-02-27 DIAGNOSIS — F419 Anxiety disorder, unspecified: Secondary | ICD-10-CM

## 2020-02-27 DIAGNOSIS — I10 Essential (primary) hypertension: Secondary | ICD-10-CM

## 2020-02-27 DIAGNOSIS — I2510 Atherosclerotic heart disease of native coronary artery without angina pectoris: Secondary | ICD-10-CM | POA: Diagnosis not present

## 2020-02-27 DIAGNOSIS — J441 Chronic obstructive pulmonary disease with (acute) exacerbation: Secondary | ICD-10-CM

## 2020-02-27 DIAGNOSIS — F329 Major depressive disorder, single episode, unspecified: Secondary | ICD-10-CM | POA: Diagnosis not present

## 2020-02-27 DIAGNOSIS — Z9981 Dependence on supplemental oxygen: Secondary | ICD-10-CM

## 2020-02-27 NOTE — Progress Notes (Signed)
Received orders from [facility]. Start of care 02/08/20.   Orders from 02/08/20 through 04/07/20 are reviewed, signed and faxed.

## 2020-03-02 ENCOUNTER — Encounter: Payer: Self-pay | Admitting: Internal Medicine

## 2020-03-03 ENCOUNTER — Encounter: Payer: Self-pay | Admitting: Internal Medicine

## 2020-03-03 ENCOUNTER — Ambulatory Visit: Payer: Medicare HMO | Admitting: Internal Medicine

## 2020-03-03 ENCOUNTER — Other Ambulatory Visit: Payer: Self-pay

## 2020-03-03 ENCOUNTER — Ambulatory Visit (INDEPENDENT_AMBULATORY_CARE_PROVIDER_SITE_OTHER): Payer: Medicare HMO | Admitting: Internal Medicine

## 2020-03-03 VITALS — BP 132/70 | HR 91 | Temp 97.1°F | Ht 64.0 in

## 2020-03-03 DIAGNOSIS — J449 Chronic obstructive pulmonary disease, unspecified: Secondary | ICD-10-CM | POA: Diagnosis not present

## 2020-03-03 DIAGNOSIS — J438 Other emphysema: Secondary | ICD-10-CM | POA: Diagnosis not present

## 2020-03-03 DIAGNOSIS — Z9981 Dependence on supplemental oxygen: Secondary | ICD-10-CM | POA: Diagnosis not present

## 2020-03-03 DIAGNOSIS — I10 Essential (primary) hypertension: Secondary | ICD-10-CM

## 2020-03-03 DIAGNOSIS — R0902 Hypoxemia: Secondary | ICD-10-CM | POA: Diagnosis not present

## 2020-03-03 DIAGNOSIS — I700 Atherosclerosis of aorta: Secondary | ICD-10-CM | POA: Diagnosis not present

## 2020-03-03 DIAGNOSIS — R252 Cramp and spasm: Secondary | ICD-10-CM

## 2020-03-03 MED ORDER — LEVALBUTEROL HCL 0.63 MG/3ML IN NEBU: 0.6300 mg | INHALATION_SOLUTION | Freq: Four times a day (QID) | RESPIRATORY_TRACT | 3 refills | Status: AC

## 2020-03-03 MED ORDER — IPRATROPIUM BROMIDE 0.02 % IN SOLN: 2.5000 mL | Freq: Four times a day (QID) | RESPIRATORY_TRACT | 3 refills | Status: AC

## 2020-03-03 MED ORDER — CYCLOBENZAPRINE HCL 10 MG PO TABS: 10.0000 mg | ORAL_TABLET | Freq: Two times a day (BID) | ORAL | 2 refills | Status: AC

## 2020-03-03 NOTE — Progress Notes (Signed)
Date:  03/03/2020   Name:  Carol Gay   DOB:  [birth year]   MRN:  [id number]   Chief Complaint: Hypertension (Follow up. Pt came with son Herbie [place] today.), COPD, and leg cramp (Pt says hospital gave cyclobenzaprine- helps with leg and foot cramps. Wants RF. )  Hospital Course:  For full details, please see H&P, progress notes, consult notes and ancillary notes.  Briefly,  Tawania Daponte Robargeis a 74 y.o.Caucasian femalewith medical history significant ofCOPD on 3 L oxygen, hypertension, hyperlipidemia, asthma, CAD, solitary kidney (congenital absence of left kidney), anxiety, colon cancer, aortic stenosis, former smoker, who presented with shortness of breath.  # Acute on chronic respiratory failure with hypoxia due to COPD exacerbation # Chronic advanced COPD On presentation, pt was found to be to be hypoxic requiring BiPAP initially, weaned off from BiPAP but continue to require more than her home oxygen.  Pt's was saturating between 88 to 90% with HFNC, peaked at 9L, down to 5L on 5/14.  Cultures negative, afebrile, no leukocytosis.  She was started on Rocephin and azithromycin due to some concern of pneumonia with her advanced lung disease, however, Procal came back neg, so Rocephin d/c'ed.  Pt received steroid with solumedrol which was tapered to prednisone 40 mg daily prior to discharge, and will finish prednisone taper at home.    Given pt's advanced COPD, her home O2 requirement was expected to increase.  Pt was weaned down to 5L and remained at 5L sating 89% at rest for 2 days, so was discharged on 5L O2.  Pt desat with exertion or ambulation but has developing coping mechanism at home where she monitors her O2 sat and takes frequent rests to let O2 sat to normalize before going onto the next activity.    Palliative care was consulted, however, pt wished to remain independent and at home as long as possible.  CAD in native artery: No chest pain, troponin remain negative.   Continued Lipitor.  Hypertension. home amlodipine was held on admission due to softer blood pressure, but resumed after discharge after BP became more elevated.  Anxiety. Continued as needed hydroxyzine.  Muscle cramps Started flexeril 10 mg BID which pt found helpful.   Discharge Diagnoses:  Principal Problem:   COPD exacerbation ([place]) Active Problems:   CAD in native artery   Dyslipidemia   Essential (primary) hypertension   Acute on chronic respiratory failure with hypoxia ([facility])   Anxiety   Hypertension This is a chronic problem. Associated symptoms include chest pain and shortness of breath. Pertinent negatives include no headaches or palpitations. Past treatments include calcium channel blockers. The current treatment provides significant improvement.   Leg cramps - she mainly has cramps at night that prevent sleep.  She was prescribed flexeril and it has helped significantly.  She needs a refill.  COPD - She has been using O2 around the clock 5-6 lpm via [place].  She is using Trelegy daily and albuterol MDI twice a day.  She has nebulizer prescribed (duoneb) but it makes her too shaky so she has not been using it.  Since her hospital stay she has been doing fairly well.  She is starting up with PT.  She has an aid who comes in several days a week.  She can not get transportation help due to her very large oxygen tank.  Her sons live in [place] and have to come home to get her to appointments.  Lab Results  Component Value Date  CREATININE 1.07 (H) 02/03/2020   BUN 41 (H) 02/03/2020   NA 137 02/03/2020   K 4.6 02/03/2020   CL 98 02/03/2020   CO2 30 02/03/2020   Lab Results  Component Value Date   CHOL 131 02/02/2019   HDL 58 02/02/2019   LDLCALC 43 02/02/2019   TRIG 151 (H) 02/02/2019   CHOLHDL 2.3 02/02/2019   Lab Results  Component Value Date   TSH 4.960 (H) 02/02/2019   Lab Results  Component Value Date   HGBA1C 5.8 02/08/2016   Lab Results    Component Value Date   WBC 8.3 02/03/2020   HGB 15.3 (H) 02/03/2020   HCT 47.1 (H) 02/03/2020   MCV 90.6 02/03/2020   PLT 207 02/03/2020   Lab Results  Component Value Date   ALT 13 01/28/2020   AST 22 01/28/2020   ALKPHOS 78 01/28/2020   BILITOT 0.7 01/28/2020     Review of Systems  Constitutional: Negative for appetite change, fever and unexpected weight change.  HENT: Negative for trouble swallowing.   Respiratory: Positive for cough, shortness of breath and wheezing.   Cardiovascular: Positive for chest pain. Negative for palpitations and leg swelling.  Gastrointestinal: Negative for abdominal pain, blood in stool, constipation and diarrhea.  Musculoskeletal: Positive for back pain, gait problem (uses cane for short distance; w/c for the community) and myalgias.  Neurological: Negative for dizziness, light-headedness and headaches.  Psychiatric/Behavioral: Negative for dysphoric mood and sleep disturbance. The patient is not nervous/anxious.     Patient Active Problem List   Diagnosis Date Noted  . Anxiety 01/28/2020  . Dependence on continuous supplemental oxygen 12/28/2017  . Vitamin D deficiency 08/23/2017  . Age-related osteoporosis without current pathological fracture 08/22/2017  . Cyst of subcutaneous tissue 05/19/2017  . HPV (human papilloma virus) anogenital infection 07/07/2016  . Edema extremities 05/05/2016  . History of anal cancer 11/26/2015  . Fistula of intestine, excluding rectum and anus   . Pulmonary emphysema ([place]) 10/24/2015  . Encounter for long-term (current) use of medications 02/28/2015  . Aortic atherosclerosis ([place]) 12/30/2014  . CAD in native artery 12/30/2014  . DDD (degenerative disc disease), lumbar 12/30/2014  . Dyslipidemia 12/30/2014  . Essential (primary) hypertension 12/30/2014  . Dermatophytoses 12/30/2014  . Compulsive tobacco user syndrome 12/30/2014    Allergies  Allergen Reactions  . Aspirin Nausea And Vomiting  .  Codeine Nausea And Vomiting  . Morphine And Related Nausea And Vomiting  . Cat Hair Extract Other (See Comments)    Runny nose, watery eyes    Past Surgical History:  Procedure Laterality Date  . APPENDECTOMY    . BREAST BIOPSY Left 1970's   neg  . COLONOSCOPY WITH PROPOFOL N/A 11/06/2015   Procedure: COLONOSCOPY WITH PROPOFOL;  Surgeon: Lucilla Lame, MD;  Location: [facility];  Service: Endoscopy;  Laterality: N/A;  LEAVE PT EARLY  . EXPLORATORY LAPAROTOMY    . LESION EXCISION  11/03/15   Anal - Dr Dahlia Byes, [facility]  . LESION REMOVAL  1970's   from tailbone  . [place] SURGERY  1997  . OVARIAN CYST REMOVAL Left   . POLYPECTOMY  11/06/2015   Procedure: POLYPECTOMY;  Surgeon: Lucilla Lame, MD;  Location: [facility];  Service: Endoscopy;;  . PORTACATH PLACEMENT N/A 12/10/2015   Procedure: INSERTION PORT-A-CATH;  Surgeon: Jules Husbands, MD;  Location: [facility];  Service: General;  Laterality: N/A;  . RECTAL EXAM UNDER ANESTHESIA N/A 06/23/2016   Procedure: RECTAL EXAM UNDER  ANESTHESIA;  Surgeon: Jules Husbands, MD;  Location: [facility];  Service: General;  Laterality: N/A;  . VEIN LIGATION AND STRIPPING      Social History   Tobacco Use  . Smoking status: Former Smoker    Packs/day: 1.00    Years: 57.00    Pack years: 57.00    Types: Cigarettes    Quit date: 03/30/2016    Years since quitting: 3.9  . Smokeless tobacco: Never Used  . Tobacco comment: Quit in July 2017  Vaping Use  . Vaping Use: Never used  Substance Use Topics  . Alcohol use: No    Alcohol/week: 0.0 standard drinks  . Drug use: No     Medication list has been reviewed and updated.  Current Meds  Medication Sig  . acetaminophen (TYLENOL) 500 MG tablet Take 2 tablets (1,000 mg total) by mouth 3 (three) times daily as needed. (Patient taking differently: Take by mouth 3 (three) times daily as needed. )  . albuterol (VENTOLIN HFA) 108 (90 Base) MCG/ACT inhaler INHALE 2 PUFFS INTO THE LUNGS  EVERY 6 HOURS AS NEEDED FOR WHEEZING OR SHORTNESS OF BREATH  . amLODipine (NORVASC) 5 MG tablet TAKE 1 TABLET EVERY DAY  . atorvastatin (LIPITOR) 20 MG tablet TAKE 1 TABLET AT BEDTIME  . calcium carbonate (TUMS - DOSED IN MG ELEMENTAL CALCIUM) 500 MG chewable tablet Chew 1 tablet (200 mg of elemental calcium total) by mouth 3 (three) times daily as needed for indigestion or heartburn.  . cyclobenzaprine (FLEXERIL) 10 MG tablet Take 1 tablet (10 mg total) by mouth 2 (two) times daily.  . hydrOXYzine (ATARAX/VISTARIL) 25 MG tablet TAKE 1 TABLET TWICE DAILY AS NEEDED  . ipratropium-albuterol (DUONEB) 0.5-2.5 (3) MG/3ML SOLN Take 3 mLs by nebulization 3 (three) times daily as needed.  . OXYGEN Inhale 5 L/min into the lungs continuous.  . TRELEGY ELLIPTA 100-62.5-25 MCG/INH AEPB INHALE 1 PUFF INTO THE LUNGS DAILY.  . [DISCONTINUED] cyclobenzaprine (FLEXERIL) 10 MG tablet Take 1 tablet (10 mg total) by mouth 2 (two) times daily.    PHQ 2/9 Scores 06/27/2019 09/06/2018 03/31/2018 12/28/2017  PHQ - 2 Score 2 1 1 1   PHQ- 9 Score 6 - - -    No flowsheet data found.  BP Readings from Last 3 Encounters:  03/03/20 132/70  02/03/20 (!) 118/55  02/02/19 124/78    Physical Exam Vitals and nursing note reviewed.  Constitutional:      General: She is not in acute distress.    Appearance: Normal appearance. She is well-developed.  HENT:     Head: Normocephalic and atraumatic.  Cardiovascular:     Rate and Rhythm: Normal rate and regular rhythm.     Heart sounds: No murmur heard.   Pulmonary:     Effort: Pulmonary effort is normal. No respiratory distress.     Breath sounds: Decreased air movement present. No wheezing.  Abdominal:     Palpations: Abdomen is soft.     Tenderness: There is no abdominal tenderness.  Musculoskeletal:        General: Normal range of motion.  Lymphadenopathy:     Cervical: No cervical adenopathy.  Skin:    General: Skin is warm and dry.     Findings: No rash.    Neurological:     Mental Status: She is alert and oriented to person, place, and time.  Psychiatric:        Attention and Perception: Attention normal.  Mood and Affect: Mood normal.     Wt Readings from Last 3 Encounters:  02/03/20 174 lb 9.7 oz (79.2 kg)  06/27/19 166 lb (75.3 kg)  02/02/19 182 lb (82.6 kg)    BP 132/70 (BP Location: Right Arm, Patient Position: Sitting, Cuff Size: Large)   Pulse 91   Temp (!) 97.1 F (36.2 C) (Oral)   Ht 5\' 4"  (1.626 m)   SpO2 93% Comment: on 5 lpm [place]  BMI 29.97 kg/m   Assessment and Plan: 1. Other emphysema ([place]) Will change duonebs to xopenex + iptratropium Continue Trelegy and Albuterol MDI - levalbuterol (XOPENEX) 0.63 MG/3ML nebulizer solution; Take 3 mLs (0.63 mg total) by nebulization in the morning, at noon, in the evening, and at bedtime.  Dispense: 1080 mL; Refill: 3 - ipratropium (ATROVENT) 0.02 % nebulizer solution; Take 2.5 mLs (0.5 mg total) by nebulization 4 (four) times daily.  Dispense: 900 mL; Refill: 3  2. Muscle cramps at night Responding well to Flexeril at HS Monitor for over-sedation - cyclobenzaprine (FLEXERIL) 10 MG tablet; Take 1 tablet (10 mg total) by mouth 2 (two) times daily.  Dispense: 60 tablet; Refill: 2  3. Essential (primary) hypertension Clinically stable exam with well controlled BP on amlodipine. Tolerating medications without side effects at this time. Pt to continue current regimen and low sodium diet; benefits of regular exercise as able discussed.  4. Aortic atherosclerosis ([place]) On appropriate aspirin and statin therapy LDL < 70 on last labs   Partially dictated using Editor, commissioning. Any errors are unintentional.  Halina Maidens, MD [facility]  03/03/2020

## 2020-03-04 DIAGNOSIS — J9611 Chronic respiratory failure with hypoxia: Secondary | ICD-10-CM | POA: Diagnosis not present

## 2020-03-04 DIAGNOSIS — I350 Nonrheumatic aortic (valve) stenosis: Secondary | ICD-10-CM | POA: Diagnosis not present

## 2020-03-04 DIAGNOSIS — J439 Emphysema, unspecified: Secondary | ICD-10-CM | POA: Diagnosis not present

## 2020-03-04 DIAGNOSIS — E559 Vitamin D deficiency, unspecified: Secondary | ICD-10-CM | POA: Diagnosis not present

## 2020-03-04 DIAGNOSIS — E785 Hyperlipidemia, unspecified: Secondary | ICD-10-CM | POA: Diagnosis not present

## 2020-03-04 DIAGNOSIS — I2510 Atherosclerotic heart disease of native coronary artery without angina pectoris: Secondary | ICD-10-CM | POA: Diagnosis not present

## 2020-03-04 DIAGNOSIS — I129 Hypertensive chronic kidney disease with stage 1 through stage 4 chronic kidney disease, or unspecified chronic kidney disease: Secondary | ICD-10-CM | POA: Diagnosis not present

## 2020-03-04 DIAGNOSIS — N189 Chronic kidney disease, unspecified: Secondary | ICD-10-CM | POA: Diagnosis not present

## 2020-03-04 DIAGNOSIS — F329 Major depressive disorder, single episode, unspecified: Secondary | ICD-10-CM | POA: Diagnosis not present

## 2020-03-05 DIAGNOSIS — J449 Chronic obstructive pulmonary disease, unspecified: Secondary | ICD-10-CM | POA: Diagnosis not present

## 2020-03-07 DIAGNOSIS — F329 Major depressive disorder, single episode, unspecified: Secondary | ICD-10-CM | POA: Diagnosis not present

## 2020-03-07 DIAGNOSIS — J439 Emphysema, unspecified: Secondary | ICD-10-CM | POA: Diagnosis not present

## 2020-03-07 DIAGNOSIS — I2510 Atherosclerotic heart disease of native coronary artery without angina pectoris: Secondary | ICD-10-CM | POA: Diagnosis not present

## 2020-03-07 DIAGNOSIS — E785 Hyperlipidemia, unspecified: Secondary | ICD-10-CM | POA: Diagnosis not present

## 2020-03-07 DIAGNOSIS — I129 Hypertensive chronic kidney disease with stage 1 through stage 4 chronic kidney disease, or unspecified chronic kidney disease: Secondary | ICD-10-CM | POA: Diagnosis not present

## 2020-03-07 DIAGNOSIS — J9611 Chronic respiratory failure with hypoxia: Secondary | ICD-10-CM | POA: Diagnosis not present

## 2020-03-07 DIAGNOSIS — N189 Chronic kidney disease, unspecified: Secondary | ICD-10-CM | POA: Diagnosis not present

## 2020-03-07 DIAGNOSIS — I350 Nonrheumatic aortic (valve) stenosis: Secondary | ICD-10-CM | POA: Diagnosis not present

## 2020-03-07 DIAGNOSIS — E559 Vitamin D deficiency, unspecified: Secondary | ICD-10-CM | POA: Diagnosis not present

## 2020-03-10 ENCOUNTER — Other Ambulatory Visit: Payer: Self-pay | Admitting: Internal Medicine

## 2020-03-12 DIAGNOSIS — F329 Major depressive disorder, single episode, unspecified: Secondary | ICD-10-CM | POA: Diagnosis not present

## 2020-03-12 DIAGNOSIS — I129 Hypertensive chronic kidney disease with stage 1 through stage 4 chronic kidney disease, or unspecified chronic kidney disease: Secondary | ICD-10-CM | POA: Diagnosis not present

## 2020-03-12 DIAGNOSIS — J439 Emphysema, unspecified: Secondary | ICD-10-CM | POA: Diagnosis not present

## 2020-03-12 DIAGNOSIS — I350 Nonrheumatic aortic (valve) stenosis: Secondary | ICD-10-CM | POA: Diagnosis not present

## 2020-03-12 DIAGNOSIS — I2510 Atherosclerotic heart disease of native coronary artery without angina pectoris: Secondary | ICD-10-CM | POA: Diagnosis not present

## 2020-03-12 DIAGNOSIS — N189 Chronic kidney disease, unspecified: Secondary | ICD-10-CM | POA: Diagnosis not present

## 2020-03-12 DIAGNOSIS — E785 Hyperlipidemia, unspecified: Secondary | ICD-10-CM | POA: Diagnosis not present

## 2020-03-12 DIAGNOSIS — E559 Vitamin D deficiency, unspecified: Secondary | ICD-10-CM | POA: Diagnosis not present

## 2020-03-12 DIAGNOSIS — J9611 Chronic respiratory failure with hypoxia: Secondary | ICD-10-CM | POA: Diagnosis not present

## 2020-03-13 DIAGNOSIS — E559 Vitamin D deficiency, unspecified: Secondary | ICD-10-CM | POA: Diagnosis not present

## 2020-03-13 DIAGNOSIS — F329 Major depressive disorder, single episode, unspecified: Secondary | ICD-10-CM | POA: Diagnosis not present

## 2020-03-13 DIAGNOSIS — J9611 Chronic respiratory failure with hypoxia: Secondary | ICD-10-CM | POA: Diagnosis not present

## 2020-03-13 DIAGNOSIS — J439 Emphysema, unspecified: Secondary | ICD-10-CM | POA: Diagnosis not present

## 2020-03-13 DIAGNOSIS — I129 Hypertensive chronic kidney disease with stage 1 through stage 4 chronic kidney disease, or unspecified chronic kidney disease: Secondary | ICD-10-CM | POA: Diagnosis not present

## 2020-03-13 DIAGNOSIS — I2510 Atherosclerotic heart disease of native coronary artery without angina pectoris: Secondary | ICD-10-CM | POA: Diagnosis not present

## 2020-03-13 DIAGNOSIS — I350 Nonrheumatic aortic (valve) stenosis: Secondary | ICD-10-CM | POA: Diagnosis not present

## 2020-03-13 DIAGNOSIS — N189 Chronic kidney disease, unspecified: Secondary | ICD-10-CM | POA: Diagnosis not present

## 2020-03-13 DIAGNOSIS — E785 Hyperlipidemia, unspecified: Secondary | ICD-10-CM | POA: Diagnosis not present

## 2020-03-17 DIAGNOSIS — I350 Nonrheumatic aortic (valve) stenosis: Secondary | ICD-10-CM | POA: Diagnosis not present

## 2020-03-17 DIAGNOSIS — I2510 Atherosclerotic heart disease of native coronary artery without angina pectoris: Secondary | ICD-10-CM | POA: Diagnosis not present

## 2020-03-17 DIAGNOSIS — F329 Major depressive disorder, single episode, unspecified: Secondary | ICD-10-CM | POA: Diagnosis not present

## 2020-03-17 DIAGNOSIS — N189 Chronic kidney disease, unspecified: Secondary | ICD-10-CM | POA: Diagnosis not present

## 2020-03-17 DIAGNOSIS — E785 Hyperlipidemia, unspecified: Secondary | ICD-10-CM | POA: Diagnosis not present

## 2020-03-17 DIAGNOSIS — J9611 Chronic respiratory failure with hypoxia: Secondary | ICD-10-CM | POA: Diagnosis not present

## 2020-03-17 DIAGNOSIS — E559 Vitamin D deficiency, unspecified: Secondary | ICD-10-CM | POA: Diagnosis not present

## 2020-03-17 DIAGNOSIS — J439 Emphysema, unspecified: Secondary | ICD-10-CM | POA: Diagnosis not present

## 2020-03-17 DIAGNOSIS — I129 Hypertensive chronic kidney disease with stage 1 through stage 4 chronic kidney disease, or unspecified chronic kidney disease: Secondary | ICD-10-CM | POA: Diagnosis not present

## 2020-03-20 DIAGNOSIS — J439 Emphysema, unspecified: Secondary | ICD-10-CM | POA: Diagnosis not present

## 2020-03-20 DIAGNOSIS — I350 Nonrheumatic aortic (valve) stenosis: Secondary | ICD-10-CM | POA: Diagnosis not present

## 2020-03-20 DIAGNOSIS — E785 Hyperlipidemia, unspecified: Secondary | ICD-10-CM | POA: Diagnosis not present

## 2020-03-20 DIAGNOSIS — I129 Hypertensive chronic kidney disease with stage 1 through stage 4 chronic kidney disease, or unspecified chronic kidney disease: Secondary | ICD-10-CM | POA: Diagnosis not present

## 2020-03-20 DIAGNOSIS — J9611 Chronic respiratory failure with hypoxia: Secondary | ICD-10-CM | POA: Diagnosis not present

## 2020-03-20 DIAGNOSIS — N189 Chronic kidney disease, unspecified: Secondary | ICD-10-CM | POA: Diagnosis not present

## 2020-03-20 DIAGNOSIS — I2510 Atherosclerotic heart disease of native coronary artery without angina pectoris: Secondary | ICD-10-CM | POA: Diagnosis not present

## 2020-03-20 DIAGNOSIS — E559 Vitamin D deficiency, unspecified: Secondary | ICD-10-CM | POA: Diagnosis not present

## 2020-03-20 DIAGNOSIS — F329 Major depressive disorder, single episode, unspecified: Secondary | ICD-10-CM | POA: Diagnosis not present

## 2020-03-25 DIAGNOSIS — I129 Hypertensive chronic kidney disease with stage 1 through stage 4 chronic kidney disease, or unspecified chronic kidney disease: Secondary | ICD-10-CM | POA: Diagnosis not present

## 2020-03-25 DIAGNOSIS — F329 Major depressive disorder, single episode, unspecified: Secondary | ICD-10-CM | POA: Diagnosis not present

## 2020-03-25 DIAGNOSIS — J9611 Chronic respiratory failure with hypoxia: Secondary | ICD-10-CM | POA: Diagnosis not present

## 2020-03-25 DIAGNOSIS — I350 Nonrheumatic aortic (valve) stenosis: Secondary | ICD-10-CM | POA: Diagnosis not present

## 2020-03-25 DIAGNOSIS — J439 Emphysema, unspecified: Secondary | ICD-10-CM | POA: Diagnosis not present

## 2020-03-25 DIAGNOSIS — E559 Vitamin D deficiency, unspecified: Secondary | ICD-10-CM | POA: Diagnosis not present

## 2020-03-25 DIAGNOSIS — I2510 Atherosclerotic heart disease of native coronary artery without angina pectoris: Secondary | ICD-10-CM | POA: Diagnosis not present

## 2020-03-25 DIAGNOSIS — N189 Chronic kidney disease, unspecified: Secondary | ICD-10-CM | POA: Diagnosis not present

## 2020-03-25 DIAGNOSIS — E785 Hyperlipidemia, unspecified: Secondary | ICD-10-CM | POA: Diagnosis not present

## 2020-03-26 DIAGNOSIS — I350 Nonrheumatic aortic (valve) stenosis: Secondary | ICD-10-CM | POA: Diagnosis not present

## 2020-03-26 DIAGNOSIS — J9611 Chronic respiratory failure with hypoxia: Secondary | ICD-10-CM | POA: Diagnosis not present

## 2020-03-26 DIAGNOSIS — I129 Hypertensive chronic kidney disease with stage 1 through stage 4 chronic kidney disease, or unspecified chronic kidney disease: Secondary | ICD-10-CM | POA: Diagnosis not present

## 2020-03-26 DIAGNOSIS — E785 Hyperlipidemia, unspecified: Secondary | ICD-10-CM | POA: Diagnosis not present

## 2020-03-26 DIAGNOSIS — N189 Chronic kidney disease, unspecified: Secondary | ICD-10-CM | POA: Diagnosis not present

## 2020-03-26 DIAGNOSIS — J439 Emphysema, unspecified: Secondary | ICD-10-CM | POA: Diagnosis not present

## 2020-03-26 DIAGNOSIS — I2510 Atherosclerotic heart disease of native coronary artery without angina pectoris: Secondary | ICD-10-CM | POA: Diagnosis not present

## 2020-03-26 DIAGNOSIS — E559 Vitamin D deficiency, unspecified: Secondary | ICD-10-CM | POA: Diagnosis not present

## 2020-03-26 DIAGNOSIS — F329 Major depressive disorder, single episode, unspecified: Secondary | ICD-10-CM | POA: Diagnosis not present

## 2020-03-28 ENCOUNTER — Ambulatory Visit: Payer: Self-pay | Admitting: *Deleted

## 2020-03-28 DIAGNOSIS — I2510 Atherosclerotic heart disease of native coronary artery without angina pectoris: Secondary | ICD-10-CM | POA: Diagnosis not present

## 2020-03-28 DIAGNOSIS — N189 Chronic kidney disease, unspecified: Secondary | ICD-10-CM | POA: Diagnosis not present

## 2020-03-28 DIAGNOSIS — I129 Hypertensive chronic kidney disease with stage 1 through stage 4 chronic kidney disease, or unspecified chronic kidney disease: Secondary | ICD-10-CM | POA: Diagnosis not present

## 2020-03-28 DIAGNOSIS — I350 Nonrheumatic aortic (valve) stenosis: Secondary | ICD-10-CM | POA: Diagnosis not present

## 2020-03-28 DIAGNOSIS — F329 Major depressive disorder, single episode, unspecified: Secondary | ICD-10-CM | POA: Diagnosis not present

## 2020-03-28 DIAGNOSIS — E559 Vitamin D deficiency, unspecified: Secondary | ICD-10-CM | POA: Diagnosis not present

## 2020-03-28 DIAGNOSIS — J9611 Chronic respiratory failure with hypoxia: Secondary | ICD-10-CM | POA: Diagnosis not present

## 2020-03-28 DIAGNOSIS — J439 Emphysema, unspecified: Secondary | ICD-10-CM | POA: Diagnosis not present

## 2020-03-28 DIAGNOSIS — E785 Hyperlipidemia, unspecified: Secondary | ICD-10-CM | POA: Diagnosis not present

## 2020-03-28 NOTE — Telephone Encounter (Signed)
Called pt told her that she could take mucinex. That my main concern was her oxygen level told pt that she needed to be seen or go to UC. She said she could not come into the office or UC. Told her that she could call EMS so they could come and get her she said if her oxygen level gets down too low that she would call EMS. That she does not want to call EMS at this time because she only said her Oxygen level drops low in to 60-70s for "a few seconds and then gets back to normal". Pt verbalized understanding.  KP

## 2020-03-28 NOTE — Telephone Encounter (Signed)
Patient feels she has increased mucus coating her throat. Patient state she has had symptoms for 3 days. She has coughing spells and feels that her throat is coated with mucus and she can't get it to move. She is drinking warm liquids and water to try to break it up. Patient had COPD and states her O2 levels are 88 normally- although today it went up to 94. Patient states she has decrease in levels when she moves/talks- can go as low as 60-70. Patient states this is hendering her PT. Patient wants to know if PCP would recommend Mucinex or something else to help clear her throat.  Advised patient protocol would be to have her seen in office- but she states she can not come into the office due to transportation issues and she has upcoming appointment. Patient wants to know what if PCP has any suggestions for her- will send message for review. Patient to continue to drink fluids to try to clear mucus from throat.  Reason for Disposition  [1] Known COPD or other severe lung disease (i.e., bronchiectasis, cystic fibrosis, lung surgery) AND [2] worsening symptoms (i.e., increased sputum purulence or amount, increased breathing difficulty  Answer Assessment - Initial Assessment Questions 1. ONSET: "When did the cough begin?"      Earlier this week 2. SEVERITY: "How bad is the cough today?"      Increased with activity 3. SPUTUM: "Describe the color of your sputum" (none, dry cough; clear, white, yellow, green)     yellowish 4. HEMOPTYSIS: "Are you coughing up any blood?" If so ask: "How much?" (flecks, streaks, tablespoons, etc.)     no 5. DIFFICULTY BREATHING: "Are you having difficulty breathing?" If Yes, ask: "How bad is it?" (e.g., mild, moderate, severe)    - MILD: No SOB at rest, mild SOB with walking, speaks normally in sentences, can lay down, no retractions, pulse < 100.    - MODERATE: SOB at rest, SOB with minimal exertion and prefers to sit, cannot lie down flat, speaks in phrases, mild  retractions, audible wheezing, pulse 100-120.    - SEVERE: Very SOB at rest, speaks in single words, struggling to breathe, sitting hunched forward, retractions, pulse > 120      Patient is on O2- hindering breathing because of cough and throat congestion 6. FEVER: "Do you have a fever?" If Yes, ask: "What is your temperature, how was it measured, and when did it start?"     no 7. CARDIAC HISTORY: "Do you have any history of heart disease?" (e.g., heart attack, congestive heart failure)      Hx heart disease 8. LUNG HISTORY: "Do you have any history of lung disease?"  (e.g., pulmonary embolus, asthma, emphysema)     COPD 9. PE RISK FACTORS: "Do you have a history of blood clots?" (or: recent major surgery, recent prolonged travel, bedridden)     no 10. OTHER SYMPTOMS: "Do you have any other symptoms?" (e.g., runny nose, wheezing, chest pain)       Nose congested,when O2 drops she has chest pain- when she is up and walks around 11. PREGNANCY: "Is there any chance you are pregnant?" "When was your last menstrual period?"       n/a 12. TRAVEL: "Have you traveled out of the country in the last month?" (e.g., travel history, exposures)       no  Protocols used: [place]

## 2020-04-03 ENCOUNTER — Inpatient Hospital Stay
Admission: EM | Admit: 2020-04-03 | Discharge: 2020-04-13 | DRG: 193 | Disposition: A | Discharge status: Home / Self Care | Payer: Medicare HMO | Attending: Internal Medicine | Admitting: Internal Medicine

## 2020-04-03 ENCOUNTER — Other Ambulatory Visit: Payer: Self-pay

## 2020-04-03 ENCOUNTER — Emergency Department: Payer: Medicare HMO

## 2020-04-03 DIAGNOSIS — I2510 Atherosclerotic heart disease of native coronary artery without angina pectoris: Secondary | ICD-10-CM | POA: Diagnosis present

## 2020-04-03 DIAGNOSIS — Z85048 Personal history of other malignant neoplasm of rectum, rectosigmoid junction, and anus: Secondary | ICD-10-CM

## 2020-04-03 DIAGNOSIS — J9611 Chronic respiratory failure with hypoxia: Secondary | ICD-10-CM | POA: Diagnosis not present

## 2020-04-03 DIAGNOSIS — Z86010 Personal history of colonic polyps: Secondary | ICD-10-CM | POA: Diagnosis not present

## 2020-04-03 DIAGNOSIS — J9621 Acute and chronic respiratory failure with hypoxia: Secondary | ICD-10-CM

## 2020-04-03 DIAGNOSIS — Y95 Nosocomial condition: Secondary | ICD-10-CM | POA: Diagnosis present

## 2020-04-03 DIAGNOSIS — I252 Old myocardial infarction: Secondary | ICD-10-CM

## 2020-04-03 DIAGNOSIS — F329 Major depressive disorder, single episode, unspecified: Secondary | ICD-10-CM | POA: Diagnosis not present

## 2020-04-03 DIAGNOSIS — R918 Other nonspecific abnormal finding of lung field: Secondary | ICD-10-CM | POA: Diagnosis not present

## 2020-04-03 DIAGNOSIS — Z66 Do not resuscitate: Secondary | ICD-10-CM | POA: Diagnosis not present

## 2020-04-03 DIAGNOSIS — J441 Chronic obstructive pulmonary disease with (acute) exacerbation: Secondary | ICD-10-CM | POA: Diagnosis not present

## 2020-04-03 DIAGNOSIS — Z9221 Personal history of antineoplastic chemotherapy: Secondary | ICD-10-CM | POA: Diagnosis not present

## 2020-04-03 DIAGNOSIS — Z7189 Other specified counseling: Secondary | ICD-10-CM | POA: Diagnosis not present

## 2020-04-03 DIAGNOSIS — R0902 Hypoxemia: Secondary | ICD-10-CM | POA: Diagnosis not present

## 2020-04-03 DIAGNOSIS — I700 Atherosclerosis of aorta: Secondary | ICD-10-CM | POA: Diagnosis not present

## 2020-04-03 DIAGNOSIS — Z8249 Family history of ischemic heart disease and other diseases of the circulatory system: Secondary | ICD-10-CM | POA: Diagnosis not present

## 2020-04-03 DIAGNOSIS — I129 Hypertensive chronic kidney disease with stage 1 through stage 4 chronic kidney disease, or unspecified chronic kidney disease: Secondary | ICD-10-CM | POA: Diagnosis not present

## 2020-04-03 DIAGNOSIS — C3490 Malignant neoplasm of unspecified part of unspecified bronchus or lung: Secondary | ICD-10-CM | POA: Diagnosis present

## 2020-04-03 DIAGNOSIS — J80 Acute respiratory distress syndrome: Secondary | ICD-10-CM | POA: Diagnosis not present

## 2020-04-03 DIAGNOSIS — D72829 Elevated white blood cell count, unspecified: Secondary | ICD-10-CM | POA: Diagnosis not present

## 2020-04-03 DIAGNOSIS — F40240 Claustrophobia: Secondary | ICD-10-CM | POA: Diagnosis present

## 2020-04-03 DIAGNOSIS — J439 Emphysema, unspecified: Secondary | ICD-10-CM | POA: Diagnosis not present

## 2020-04-03 DIAGNOSIS — R591 Generalized enlarged lymph nodes: Secondary | ICD-10-CM | POA: Diagnosis not present

## 2020-04-03 DIAGNOSIS — Z0184 Encounter for antibody response examination: Secondary | ICD-10-CM

## 2020-04-03 DIAGNOSIS — Z20822 Contact with and (suspected) exposure to covid-19: Secondary | ICD-10-CM | POA: Diagnosis not present

## 2020-04-03 DIAGNOSIS — J9811 Atelectasis: Secondary | ICD-10-CM | POA: Diagnosis not present

## 2020-04-03 DIAGNOSIS — E559 Vitamin D deficiency, unspecified: Secondary | ICD-10-CM | POA: Diagnosis not present

## 2020-04-03 DIAGNOSIS — Z923 Personal history of irradiation: Secondary | ICD-10-CM

## 2020-04-03 DIAGNOSIS — J449 Chronic obstructive pulmonary disease, unspecified: Secondary | ICD-10-CM | POA: Diagnosis not present

## 2020-04-03 DIAGNOSIS — R000 Tachycardia, unspecified: Secondary | ICD-10-CM | POA: Diagnosis not present

## 2020-04-03 DIAGNOSIS — J9622 Acute and chronic respiratory failure with hypercapnia: Secondary | ICD-10-CM | POA: Diagnosis not present

## 2020-04-03 DIAGNOSIS — E785 Hyperlipidemia, unspecified: Secondary | ICD-10-CM | POA: Diagnosis present

## 2020-04-03 DIAGNOSIS — Z79899 Other long term (current) drug therapy: Secondary | ICD-10-CM

## 2020-04-03 DIAGNOSIS — Z807 Family history of other malignant neoplasms of lymphoid, hematopoietic and related tissues: Secondary | ICD-10-CM

## 2020-04-03 DIAGNOSIS — I350 Nonrheumatic aortic (valve) stenosis: Secondary | ICD-10-CM | POA: Diagnosis not present

## 2020-04-03 DIAGNOSIS — I361 Nonrheumatic tricuspid (valve) insufficiency: Secondary | ICD-10-CM | POA: Diagnosis not present

## 2020-04-03 DIAGNOSIS — E876 Hypokalemia: Secondary | ICD-10-CM | POA: Diagnosis not present

## 2020-04-03 DIAGNOSIS — J189 Pneumonia, unspecified organism: Principal | ICD-10-CM | POA: Diagnosis present

## 2020-04-03 DIAGNOSIS — N189 Chronic kidney disease, unspecified: Secondary | ICD-10-CM | POA: Diagnosis not present

## 2020-04-03 DIAGNOSIS — R0602 Shortness of breath: Secondary | ICD-10-CM | POA: Diagnosis not present

## 2020-04-03 DIAGNOSIS — R9389 Abnormal findings on diagnostic imaging of other specified body structures: Secondary | ICD-10-CM

## 2020-04-03 DIAGNOSIS — Z515 Encounter for palliative care: Secondary | ICD-10-CM | POA: Diagnosis not present

## 2020-04-03 DIAGNOSIS — J984 Other disorders of lung: Secondary | ICD-10-CM | POA: Diagnosis not present

## 2020-04-03 DIAGNOSIS — J9690 Respiratory failure, unspecified, unspecified whether with hypoxia or hypercapnia: Secondary | ICD-10-CM | POA: Diagnosis not present

## 2020-04-03 DIAGNOSIS — J181 Lobar pneumonia, unspecified organism: Secondary | ICD-10-CM | POA: Diagnosis not present

## 2020-04-03 DIAGNOSIS — I10 Essential (primary) hypertension: Secondary | ICD-10-CM | POA: Diagnosis present

## 2020-04-03 LAB — LACTIC ACID, PLASMA
Lactic Acid, Venous: 3.3 mmol/L (ref 0.5–1.9)
Lactic Acid, Venous: 3.9 mmol/L (ref 0.5–1.9)

## 2020-04-03 LAB — CBC WITH DIFFERENTIAL/PLATELET
Abs Immature Granulocytes: 0.03 10*3/uL (ref 0.00–0.07)
Basophils Absolute: 0.2 10*3/uL — ABNORMAL HIGH (ref 0.0–0.1)
Basophils Relative: 2 %
Eosinophils Absolute: 0.2 10*3/uL (ref 0.0–0.5)
Eosinophils Relative: 2 %
HCT: 45.8 % (ref 36.0–46.0)
Hemoglobin: 15.7 g/dL — ABNORMAL HIGH (ref 12.0–15.0)
Immature Granulocytes: 0 %
Lymphocytes Relative: 23 %
Lymphs Abs: 2.2 10*3/uL (ref 0.7–4.0)
MCH: 30.4 pg (ref 26.0–34.0)
MCHC: 34.3 g/dL (ref 30.0–36.0)
MCV: 88.6 fL (ref 80.0–100.0)
Monocytes Absolute: 0.6 10*3/uL (ref 0.1–1.0)
Monocytes Relative: 6 %
Neutro Abs: 6.2 10*3/uL (ref 1.7–7.7)
Neutrophils Relative %: 67 %
Platelets: 312 10*3/uL (ref 150–400)
RBC: 5.17 MIL/uL — ABNORMAL HIGH (ref 3.87–5.11)
RDW: 14.6 % (ref 11.5–15.5)
WBC: 9.4 10*3/uL (ref 4.0–10.5)
nRBC: 0 % (ref 0.0–0.2)

## 2020-04-03 LAB — COMPREHENSIVE METABOLIC PANEL
ALT: 14 U/L (ref 0–44)
AST: 26 U/L (ref 15–41)
Albumin: 4.4 g/dL (ref 3.5–5.0)
Alkaline Phosphatase: 84 U/L (ref 38–126)
Anion gap: 10 (ref 5–15)
BUN: 9 mg/dL (ref 8–23)
CO2: 26 mmol/L (ref 22–32)
Calcium: 10.5 mg/dL — ABNORMAL HIGH (ref 8.9–10.3)
Chloride: 103 mmol/L (ref 98–111)
Creatinine, Ser: 1.05 mg/dL — ABNORMAL HIGH (ref 0.44–1.00)
GFR calc Af Amer: >60 mL/min (ref >60)
GFR calc non Af Amer: 52 mL/min — ABNORMAL LOW (ref >60)
Glucose, Bld: 146 mg/dL — ABNORMAL HIGH (ref 70–99)
Potassium: 4 mmol/L (ref 3.5–5.1)
Sodium: 139 mmol/L (ref 135–145)
Total Bilirubin: 1 mg/dL (ref 0.3–1.2)
Total Protein: 9.1 g/dL — ABNORMAL HIGH (ref 6.5–8.1)

## 2020-04-03 LAB — BLOOD GAS, VENOUS
Acid-Base Excess: 1.2 mmol/L (ref 0.0–2.0)
Bicarbonate: 26 mmol/L (ref 20.0–28.0)
FIO2: 1
O2 Saturation: 76.8 %
Patient temperature: 37
pCO2, Ven: 41 mmHg — ABNORMAL LOW (ref 44.0–60.0)
pH, Ven: 7.41 (ref 7.250–7.430)
pO2, Ven: 41 mmHg (ref 32.0–45.0)

## 2020-04-03 LAB — SARS CORONAVIRUS 2 BY RT PCR (HOSPITAL ORDER, PERFORMED IN ~~LOC~~ HOSPITAL LAB): SARS Coronavirus 2: NEGATIVE

## 2020-04-03 LAB — TROPONIN I (HIGH SENSITIVITY)
Troponin I (High Sensitivity): 11 ng/L (ref <18)
Troponin I (High Sensitivity): 13 ng/L (ref <18)

## 2020-04-03 LAB — BRAIN NATRIURETIC PEPTIDE: B Natriuretic Peptide: 83.2 pg/mL (ref 0.0–100.0)

## 2020-04-03 MED ORDER — ENOXAPARIN SODIUM 40 MG/0.4ML ~~LOC~~ SOLN
40.0000 mg | SUBCUTANEOUS | Status: DC
  Administered 2020-04-03 – 2020-04-12 (×10): 40 mg via SUBCUTANEOUS
  Filled 2020-04-03 (×10): qty 0.4

## 2020-04-03 MED ORDER — SODIUM CHLORIDE 0.9 % IV BOLUS
500.0000 mL | Freq: Once | INTRAVENOUS | Status: AC
  Administered 2020-04-03: 500 mL via INTRAVENOUS

## 2020-04-03 MED ORDER — IPRATROPIUM-ALBUTEROL 0.5-2.5 (3) MG/3ML IN SOLN
3.0000 mL | Freq: Once | RESPIRATORY_TRACT | Status: AC
  Administered 2020-04-03: 3 mL via RESPIRATORY_TRACT
  Filled 2020-04-03: qty 3

## 2020-04-03 MED ORDER — LEVALBUTEROL HCL 0.63 MG/3ML IN NEBU
0.6300 mg | INHALATION_SOLUTION | Freq: Four times a day (QID) | RESPIRATORY_TRACT | Status: DC | PRN
  Administered 2020-04-04 – 2020-04-05 (×3): 0.63 mg via RESPIRATORY_TRACT
  Filled 2020-04-03 (×4): qty 3

## 2020-04-03 MED ORDER — LABETALOL HCL 5 MG/ML IV SOLN: 20.0000 mg | INTRAVENOUS | Status: DC | PRN

## 2020-04-03 MED ORDER — CYCLOBENZAPRINE HCL 10 MG PO TABS
10.0000 mg | ORAL_TABLET | Freq: Two times a day (BID) | ORAL | Status: DC
  Administered 2020-04-03 – 2020-04-13 (×20): 10 mg via ORAL
  Filled 2020-04-03 (×20): qty 1

## 2020-04-03 MED ORDER — HYDROXYZINE HCL 25 MG PO TABS: 25.0000 mg | ORAL_TABLET | Freq: Two times a day (BID) | ORAL | Status: DC | PRN

## 2020-04-03 MED ORDER — VANCOMYCIN HCL 1750 MG/350ML IV SOLN
1750.0000 mg | Freq: Once | INTRAVENOUS | Status: AC
  Administered 2020-04-03: 1750 mg via INTRAVENOUS
  Filled 2020-04-03: qty 350

## 2020-04-03 MED ORDER — ACETAMINOPHEN 650 MG RE SUPP: 650.0000 mg | Freq: Four times a day (QID) | RECTAL | Status: DC | PRN

## 2020-04-03 MED ORDER — SODIUM CHLORIDE 0.9 % IV SOLN
2.0000 g | INTRAVENOUS | Status: DC
  Administered 2020-04-04: 2 g via INTRAVENOUS
  Filled 2020-04-03: qty 2
  Filled 2020-04-03: qty 20

## 2020-04-03 MED ORDER — CALCIUM CARBONATE ANTACID 500 MG PO CHEW
1.0000 | CHEWABLE_TABLET | Freq: Three times a day (TID) | ORAL | Status: DC | PRN
  Administered 2020-04-04 – 2020-04-09 (×7): 200 mg via ORAL
  Filled 2020-04-03 (×9): qty 1

## 2020-04-03 MED ORDER — IPRATROPIUM-ALBUTEROL 0.5-2.5 (3) MG/3ML IN SOLN
3.0000 mL | Freq: Four times a day (QID) | RESPIRATORY_TRACT | Status: DC
  Administered 2020-04-04 – 2020-04-05 (×6): 3 mL via RESPIRATORY_TRACT
  Filled 2020-04-03 (×6): qty 3

## 2020-04-03 MED ORDER — CALCIUM CARBONATE ANTACID 500 MG PO CHEW
1.0000 | CHEWABLE_TABLET | Freq: Once | ORAL | Status: AC
  Administered 2020-04-03: 200 mg via ORAL

## 2020-04-03 MED ORDER — TRAZODONE HCL 50 MG PO TABS
25.0000 mg | ORAL_TABLET | Freq: Every evening | ORAL | Status: DC | PRN
  Administered 2020-04-06 – 2020-04-12 (×7): 25 mg via ORAL
  Filled 2020-04-03 (×7): qty 1

## 2020-04-03 MED ORDER — SODIUM CHLORIDE 0.9 % IV BOLUS (SEPSIS)
1000.0000 mL | Freq: Once | INTRAVENOUS | Status: AC
  Administered 2020-04-03: 1000 mL via INTRAVENOUS

## 2020-04-03 MED ORDER — ATORVASTATIN CALCIUM 20 MG PO TABS
20.0000 mg | ORAL_TABLET | Freq: Every day | ORAL | Status: DC
  Administered 2020-04-03 – 2020-04-12 (×10): 20 mg via ORAL
  Filled 2020-04-03 (×10): qty 1

## 2020-04-03 MED ORDER — METHYLPREDNISOLONE SODIUM SUCC 125 MG IJ SOLR
125.0000 mg | Freq: Once | INTRAMUSCULAR | Status: AC
  Administered 2020-04-03: 125 mg via INTRAVENOUS
  Filled 2020-04-03: qty 2

## 2020-04-03 MED ORDER — SODIUM CHLORIDE 0.9 % IV SOLN: INTRAVENOUS | Status: DC

## 2020-04-03 MED ORDER — GUAIFENESIN ER 600 MG PO TB12
600.0000 mg | ORAL_TABLET | Freq: Two times a day (BID) | ORAL | Status: DC
  Administered 2020-04-03 – 2020-04-13 (×20): 600 mg via ORAL
  Filled 2020-04-03 (×20): qty 1

## 2020-04-03 MED ORDER — ONDANSETRON HCL 4 MG/2ML IJ SOLN
4.0000 mg | Freq: Four times a day (QID) | INTRAMUSCULAR | Status: DC | PRN
  Administered 2020-04-06: 4 mg via INTRAVENOUS
  Filled 2020-04-03: qty 2

## 2020-04-03 MED ORDER — MAGNESIUM HYDROXIDE 400 MG/5ML PO SUSP
30.0000 mL | Freq: Every day | ORAL | Status: DC | PRN
  Filled 2020-04-03: qty 30

## 2020-04-03 MED ORDER — METHYLPREDNISOLONE SODIUM SUCC 40 MG IJ SOLR
40.0000 mg | Freq: Three times a day (TID) | INTRAMUSCULAR | Status: DC
  Administered 2020-04-03 – 2020-04-04 (×2): 40 mg via INTRAVENOUS
  Filled 2020-04-03 (×2): qty 1

## 2020-04-03 MED ORDER — ACETAMINOPHEN 325 MG PO TABS
650.0000 mg | ORAL_TABLET | Freq: Four times a day (QID) | ORAL | Status: DC | PRN
  Administered 2020-04-03 – 2020-04-13 (×16): 650 mg via ORAL
  Filled 2020-04-03 (×16): qty 2

## 2020-04-03 MED ORDER — SODIUM CHLORIDE 0.9 % IV SOLN
500.0000 mg | INTRAVENOUS | Status: AC
  Administered 2020-04-04 – 2020-04-07 (×5): 500 mg via INTRAVENOUS
  Filled 2020-04-03 (×5): qty 500

## 2020-04-03 MED ORDER — ONDANSETRON HCL 4 MG PO TABS: 4.0000 mg | ORAL_TABLET | Freq: Four times a day (QID) | ORAL | Status: DC | PRN

## 2020-04-03 MED ORDER — AMLODIPINE BESYLATE 5 MG PO TABS
5.0000 mg | ORAL_TABLET | Freq: Every day | ORAL | Status: DC
  Administered 2020-04-04 – 2020-04-13 (×10): 5 mg via ORAL
  Filled 2020-04-03 (×10): qty 1

## 2020-04-03 MED ORDER — GUAIFENESIN-DM 100-10 MG/5ML PO SYRP
5.0000 mL | ORAL_SOLUTION | ORAL | Status: DC | PRN
  Filled 2020-04-03: qty 5

## 2020-04-03 MED ORDER — SODIUM CHLORIDE 0.9 % IV SOLN
2.0000 g | Freq: Once | INTRAVENOUS | Status: AC
  Administered 2020-04-03: 2 g via INTRAVENOUS
  Filled 2020-04-03: qty 2

## 2020-04-03 MED ORDER — VANCOMYCIN HCL IN DEXTROSE 1-5 GM/200ML-% IV SOLN: 1000.0000 mg | Freq: Once | INTRAVENOUS | Status: DC

## 2020-04-03 NOTE — H&P (Signed)
[facility] [place] NAME: Carol Gay    MR#:  [id number]  DATE OF BIRTH:  16-Jul-1946  DATE OF ADMISSION:  04/03/2020  PRIMARY CARE PHYSICIAN: Glean Hess, MD   REQUESTING/REFERRING PHYSICIAN: Arta Silence, MD  CHIEF COMPLAINT:   Chief Complaint  Patient presents with  . Shortness of Breath    HISTORY OF PRESENT ILLNESS:  Carol Gay  is a 74 y.o. Caucasian female with history of advanced COPD with chronic respiratory failure on home O2 at 6 L/min with baseline pulse oximetry in the high 80s on her supplemental home oxygen, who presented to the emergency room with acute onset of worsening dyspnea with associated chest heaviness that increases with deep breathing.  She has been having associated dry cough and wheezing.  She denied any fever or chills.  No nausea or vomiting or abdominal pain.  She had a loose bowel movement this morning.  She denies any worsening lower extremity edema or orthopnea or paroxysmal nocturnal dyspnea.  No dysuria, oliguria or hematuria or flank pain.  Upon presentation to the emergency room, blood pressure was 156/95 with a heart rate of 115 and pulse ox 90 was 95% on 100% nonrebreather with respiratory rate of 20 and temperature 97.9 and later pulse currently 97% from on 11 L on HFNC.  Labs revealed a VBG with pH 7.41 and HCO3 of 26, unremarkable CMP and BNP of 83.2 with high-sensitivity troponin I of 11.  Lactic acid was 4.3 and later 3.9 and CBC showed hemoconcentration.  COVID-19 PCR came back negative.  2 blood cultures were drawn.  Chest x-ray showed interval development of patchy and interstitial opacities within the mid to lower aspect of the right lung suspicious for pneumonia.  EKG showed sinus tachycardia with a rate of 114 with PVCs left atrial dilatation and right axis deviation and Q waves anteroseptally.  The patient was given duo nebs, 2.5 L bolus of IV normal saline so far, tums, 125 mg IV  Solu-Medrol and antibiotic therapy with IV cefepime and vancomycin.  She will be admitted to a progressive unit bed for further evaluation and management. PAST MEDICAL HISTORY:   Past Medical History:  Diagnosis Date  . Aortic atherosclerosis ([place])   . Arm pain   . Asthma   . Benign neoplasm of rectosigmoid junction   . Benign neoplasm of transverse colon   . CAD in native artery   . Cancer ([place]) 09/2015   rectum  . Cigarette smoker   . Claustrophobia   . Colon cancer ([place])   . 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 COPD (chronic obstructive pulmonary disease) ([place])   . DDD (degenerative disc disease), lumbar       . Dermatophytoses   . Dyslipidemia   . H/O blood clots    left leg, veins stripped  . Hemorrhoids   . Hypertension   . Last menstrual period (LMP) > 10 days ago 1994  . Myocardial infarction [facility])    "mild" - age 28  . Personal history of chemotherapy   . Personal history of radiation therapy   . Pulmonary emphysema ([place])   . Rectal bleeding   . Shortness of breath dyspnea   . Smokers' cough ([place])   . Spinal headache    with one of my children's birth  . Vertigo   . Wears dentures    has full upper and lower -  doesn't wear    PAST SURGICAL HISTORY:   Past Surgical History:  Procedure Laterality Date  . APPENDECTOMY    . BREAST BIOPSY Left 1970's   neg  . COLONOSCOPY WITH PROPOFOL N/A 11/06/2015   Procedure: COLONOSCOPY WITH PROPOFOL;  Surgeon: Lucilla Lame, MD;  Location: [facility];  Service: Endoscopy;  Laterality: N/A;  LEAVE PT EARLY  . EXPLORATORY LAPAROTOMY    . LESION EXCISION  11/03/15   Anal - Dr Dahlia Byes, [facility]  . LESION REMOVAL  1970's   from tailbone  . [place] SURGERY  1997  . OVARIAN CYST REMOVAL Left   . POLYPECTOMY  11/06/2015   Procedure: POLYPECTOMY;  Surgeon: Lucilla Lame, MD;  Location: [facility];  Service: Endoscopy;;  . PORTACATH PLACEMENT N/A 12/10/2015   Procedure: INSERTION  PORT-A-CATH;  Surgeon: Jules Husbands, MD;  Location: [facility];  Service: General;  Laterality: N/A;  . RECTAL EXAM UNDER ANESTHESIA N/A 06/23/2016   Procedure: RECTAL EXAM UNDER ANESTHESIA;  Surgeon: Jules Husbands, MD;  Location: [facility];  Service: General;  Laterality: N/A;  . VEIN LIGATION AND STRIPPING      SOCIAL HISTORY:   Social History   Tobacco Use  . Smoking status: Former Smoker    Packs/day: 1.00    Years: 57.00    Pack years: 57.00    Types: Cigarettes    Quit date: 03/30/2016    Years since quitting: 4.0  . Smokeless tobacco: Never Used  . Tobacco comment: Quit in July 2017  Substance Use Topics  . Alcohol use: No    Alcohol/week: 0.0 standard drinks    FAMILY HISTORY:   Family History  Problem Relation Age of Onset  . Cancer Mother        lymphoma  . Heart disease Mother   . Cancer Maternal Grandmother        breast  . Birth defects Maternal Grandmother     DRUG ALLERGIES:   Allergies  Allergen Reactions  . Aspirin Nausea And Vomiting  . Codeine Nausea And Vomiting  . Morphine And Related Nausea And Vomiting  . Cat Hair Extract Other (See Comments)    Runny nose, watery eyes    REVIEW OF SYSTEMS:   ROS As per history of present illness. All pertinent systems were reviewed above. Constitutional,  HEENT, cardiovascular, respiratory, GI, GU, musculoskeletal, neuro, psychiatric, endocrine,  integumentary and hematologic systems were reviewed and are otherwise  negative/unremarkable except for positive findings mentioned above in the HPI.   MEDICATIONS AT HOME:   Prior to Admission medications   Medication Sig Start Date End Date Taking? Authorizing Provider  acetaminophen (TYLENOL) 500 MG tablet Take 2 tablets (1,000 mg total) by mouth 3 (three) times daily as needed. Patient taking differently: Take by mouth 3 (three) times daily as needed.  02/03/20   Enzo Bi, MD  albuterol (VENTOLIN HFA) 108 (90 Base) MCG/ACT inhaler INHALE 2 PUFFS INTO THE  LUNGS EVERY 6 HOURS AS NEEDED FOR WHEEZING OR SHORTNESS OF BREATH 02/03/20   Enzo Bi, MD  amLODipine (NORVASC) 5 MG tablet TAKE 1 TABLET EVERY DAY 07/16/19   Glean Hess, MD  atorvastatin (LIPITOR) 20 MG tablet TAKE 1 TABLET AT BEDTIME 03/10/20   Glean Hess, MD  calcium carbonate (TUMS - DOSED IN MG ELEMENTAL CALCIUM) 500 MG chewable tablet Chew 1 tablet (200 mg of elemental calcium total) by mouth 3 (three) times daily as needed for indigestion or heartburn. 02/03/20  Enzo Bi, MD  cyclobenzaprine (FLEXERIL) 10 MG tablet Take 1 tablet (10 mg total) by mouth 2 (two) times daily. 03/03/20   Glean Hess, MD  hydrOXYzine (ATARAX/VISTARIL) 25 MG tablet TAKE 1 TABLET TWICE DAILY AS NEEDED 11/12/19   Glean Hess, MD  ipratropium (ATROVENT) 0.02 % nebulizer solution Take 2.5 mLs (0.5 mg total) by nebulization 4 (four) times daily. 03/03/20   Glean Hess, MD  ipratropium-albuterol (DUONEB) 0.5-2.5 (3) MG/3ML SOLN Take 3 mLs by nebulization 3 (three) times daily as needed. 02/03/20   Enzo Bi, MD  levalbuterol Penne Lash) 0.63 MG/3ML nebulizer solution Take 3 mLs (0.63 mg total) by nebulization in the morning, at noon, in the evening, and at bedtime. 03/03/20   Glean Hess, MD  OXYGEN Inhale 5 L/min into the lungs continuous.    [provider]  TRELEGY ELLIPTA 100-62.5-25 MCG/INH AEPB INHALE 1 PUFF INTO THE LUNGS DAILY. 11/12/19   Glean Hess, MD      VITAL SIGNS:  Blood pressure 129/72, pulse (!) 112, temperature 97.9 F (36.6 C), temperature source Oral, resp. rate 20, height 5\' 2"  (1.575 m), weight 77.3 kg, SpO2 93 %.  PHYSICAL EXAMINATION:  Physical Exam  GENERAL:  74 y.o.-year-old Caucasian female patient lying in the bed with mild respiratory distress with conversational dyspnea on high flow nasal cannula. EYES: Pupils equal, round, reactive to light and accommodation. No scleral icterus. Extraocular muscles intact.  HEENT: Head atraumatic,  normocephalic. Oropharynx and nasopharynx clear.  NECK:  Supple, no jugular venous distention. No thyroid enlargement, no tenderness.  LUNGS: Diminished right basal breath sounds with associated crackles. CARDIOVASCULAR: Regular rate and rhythm, S1, S2 normal. No murmurs, rubs, or gallops.  ABDOMEN: Soft, nondistended, nontender. Bowel sounds present. No organomegaly or mass.  EXTREMITIES: No pedal edema, cyanosis, or clubbing.  NEUROLOGIC: Cranial nerves II through XII are intact. Muscle strength 5/5 in all extremities. Sensation intact. Gait not checked.  PSYCHIATRIC: The patient is alert and oriented x 3.  Normal affect and good eye contact. SKIN: No obvious rash, lesion, or ulcer.   LABORATORY PANEL:   CBC Recent Labs  Lab 04/03/20 1543  WBC 9.4  HGB 15.7*  HCT 45.8  PLT 312   ------------------------------------------------------------------------------------------------------------------  Chemistries  Recent Labs  Lab 04/03/20 1543  NA 139  K 4.0  CL 103  CO2 26  GLUCOSE 146*  BUN 9  CREATININE 1.05*  CALCIUM 10.5*  AST 26  ALT 14  ALKPHOS 84  BILITOT 1.0   ------------------------------------------------------------------------------------------------------------------  Cardiac Enzymes No results for input(s): TROPONINI in the last 168 hours. ------------------------------------------------------------------------------------------------------------------  RADIOLOGY:  DG Chest 1 View  Result Date: 04/03/2020 CLINICAL DATA:  Shortness of breath EXAM: CHEST  1 VIEW COMPARISON:  01/28/2020 FINDINGS: Right-sided chest port in stable positioning. Heart size is normal. Atherosclerotic calcification of the aortic knob. Interval development of patchy and interstitial opacities within the mid to lower aspect of the right lung. Left lung is hyperexpanded and clear. Scattered granulomas are unchanged. No pleural effusion or pneumothorax identified. IMPRESSION: Interval  development of patchy and interstitial opacities within the mid to lower aspect of the right lung. Findings suspicious for pneumonia. Radiographic follow-up to resolution is recommended. Electronically Signed   By: Davina Poke D.O.   On: 04/03/2020 16:24      IMPRESSION AND PLAN:   1. Right lower lobe pneumonia likely community-acquired with suspected gram-negative pneumonia with subsequent sepsis without severe sepsis so far or septic shock. The patient has  tachycardia and hypoxia and elevated lactic acid level. -The patient will be admitted to progressive unit bed. -She will be continued on IV antibiotic therapy with IV Rocephin and Zithromax. -We will place her on scheduled and as needed duo nebs. -Medical therapy will be provided. -We will obtain sputum Gram stain culture and sensitivity as well as pneumonia antigens. -We will follow blood cultures. -O2 protocol will be followed. -We will obtain ABG. -She is currently refusing to wear BiPAP.  2. COPD exacerbation. -This is clearly secondary to #1. -We will continue her on scheduled IV steroid therapy with Solu-Medrol. -She will be continued on scheduled and as needed duo nebs in addition to IV antibiotics. -Will be given Xopenex with associated tachycardia.  3. Acute hypoxic respiratory failure on top of chronic respiratory failure. -This is secondary to #1 and #2. -We will continue her on high flow nasal cannula at this time and follow O2 protocol. -Respiratory therapy consult to be obtained. -At this time she does not need BiPAP.  4.  Uncontrolled hypertension. -We will continue Norvasc and place her on as needed IV labetalol.  5. DVT prophylaxis. -Subcutaneous Lovenox.   All the records are reviewed and case discussed with ED provider. The plan of care was discussed in details with the patient (and family). I answered all questions. The patient agreed to proceed with the above mentioned plan. Further management will  depend upon hospital course.   CODE STATUS: The patient is DNR/DNI.  Status is: Inpatient  Remains inpatient appropriate because:Ongoing diagnostic testing needed not appropriate for outpatient work up, Unsafe d/c plan, IV treatments appropriate due to intensity of illness or inability to take PO and Inpatient level of care appropriate due to severity of illness   Dispo: The patient is from: Home              Anticipated d/c is to: Home              Anticipated d/c date is: 3 days              Patient currently is not medically stable to d/c.   TOTAL TIME TAKING CARE OF THIS PATIENT: 55 minutes.    Christel Mormon M.D on 04/03/2020 at 7:56 PM  [facility]   From 7 PM-7 AM, contact night-coverage [email or web address]  CC: Primary care physician; Glean Hess, MD   Note: This dictation was prepared with Dragon dictation along with smaller phrase technology. Any transcriptional typo errors that result from this process are unintentional.

## 2020-04-03 NOTE — ED Provider Notes (Signed)
[facility] Emergency Department Provider Note ____________________________________________   First MD Initiated Contact with Patient 04/03/20 1549     (approximate)  I have reviewed the triage vital signs and the nursing notes.   HISTORY  Chief Complaint Shortness of Breath    HPI Carol Gay is a 74 y.o. female with PMH as noted below including COPD on 6 L home O2 (the patient states her baseline O2 saturation is in the mid to high 80s even on this supplemental oxygen) who presents with worsening shortness of breath, chest tightness, and hypoxia over the last several days.  It acutely worsened this morning.  She reports persistent cough with no change.  She has no fever or chills.  She states that when she gets up she feels very dizzy, her O2 saturation goes down to the 70s.  Past Medical History:  Diagnosis Date  . Aortic atherosclerosis ([place])   . Arm pain   . Asthma   . Benign neoplasm of rectosigmoid junction   . Benign neoplasm of transverse colon   . CAD in native artery   . Cancer ([place]) 09/2015   rectum  . Cigarette smoker   . Claustrophobia   . Colon cancer ([place])   . 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 COPD (chronic obstructive pulmonary disease) ([place])   . DDD (degenerative disc disease), lumbar       . Dermatophytoses   . Dyslipidemia   . H/O blood clots    left leg, veins stripped  . Hemorrhoids   . Hypertension   . Last menstrual period (LMP) > 10 days ago 1994  . Myocardial infarction [facility])    "mild" - age 46  . Personal history of chemotherapy   . Personal history of radiation therapy   . Pulmonary emphysema ([place])   . Rectal bleeding   . Shortness of breath dyspnea   . Smokers' cough ([place])   . Spinal headache    with one of my children's birth  . Vertigo   . Wears dentures    has full upper and lower - doesn't wear    Patient Active Problem List   Diagnosis Date Noted  . Pneumonia  04/03/2020  . Anxiety 01/28/2020  . Dependence on continuous supplemental oxygen 12/28/2017  . Vitamin D deficiency 08/23/2017  . Age-related osteoporosis without current pathological fracture 08/22/2017  . Cyst of subcutaneous tissue 05/19/2017  . HPV (human papilloma virus) anogenital infection 07/07/2016  . Edema extremities 05/05/2016  . History of anal cancer 11/26/2015  . Fistula of intestine, excluding rectum and anus   . Pulmonary emphysema ([place]) 10/24/2015  . Encounter for long-term (current) use of medications 02/28/2015  . Aortic atherosclerosis ([place]) 12/30/2014  . CAD in native artery 12/30/2014  . DDD (degenerative disc disease), lumbar 12/30/2014  . Dyslipidemia 12/30/2014  . Essential (primary) hypertension 12/30/2014  . Dermatophytoses 12/30/2014  . Compulsive tobacco user syndrome 12/30/2014    Past Surgical History:  Procedure Laterality Date  . APPENDECTOMY    . BREAST BIOPSY Left 1970's   neg  . COLONOSCOPY WITH PROPOFOL N/A 11/06/2015   Procedure: COLONOSCOPY WITH PROPOFOL;  Surgeon: Lucilla Lame, MD;  Location: [place];  Service: Endoscopy;  Laterality: N/A;  LEAVE PT EARLY  . EXPLORATORY LAPAROTOMY    . LESION EXCISION  11/03/15   Anal - Dr Dahlia Byes, [facility]  . LESION REMOVAL  1970's   from tailbone  .  [place] SURGERY  1997  . OVARIAN CYST REMOVAL Left   . POLYPECTOMY  11/06/2015   Procedure: POLYPECTOMY;  Surgeon: Lucilla Lame, MD;  Location: [facility];  Service: Endoscopy;;  . PORTACATH PLACEMENT N/A 12/10/2015   Procedure: INSERTION PORT-A-CATH;  Surgeon: Jules Husbands, MD;  Location: [facility];  Service: General;  Laterality: N/A;  . RECTAL EXAM UNDER ANESTHESIA N/A 06/23/2016   Procedure: RECTAL EXAM UNDER ANESTHESIA;  Surgeon: Jules Husbands, MD;  Location: [facility];  Service: General;  Laterality: N/A;  . VEIN LIGATION AND STRIPPING      Prior to Admission medications   Medication Sig Start Date End Date Taking? Authorizing  Provider  acetaminophen (TYLENOL) 500 MG tablet Take 2 tablets (1,000 mg total) by mouth 3 (three) times daily as needed. Patient taking differently: Take by mouth 3 (three) times daily as needed.  02/03/20   Enzo Bi, MD  albuterol (VENTOLIN HFA) 108 (90 Base) MCG/ACT inhaler INHALE 2 PUFFS INTO THE LUNGS EVERY 6 HOURS AS NEEDED FOR WHEEZING OR SHORTNESS OF BREATH 02/03/20   Enzo Bi, MD  amLODipine (NORVASC) 5 MG tablet TAKE 1 TABLET EVERY DAY 07/16/19   Glean Hess, MD  atorvastatin (LIPITOR) 20 MG tablet TAKE 1 TABLET AT BEDTIME 03/10/20   Glean Hess, MD  calcium carbonate (TUMS - DOSED IN MG ELEMENTAL CALCIUM) 500 MG chewable tablet Chew 1 tablet (200 mg of elemental calcium total) by mouth 3 (three) times daily as needed for indigestion or heartburn. 02/03/20   Enzo Bi, MD  cyclobenzaprine (FLEXERIL) 10 MG tablet Take 1 tablet (10 mg total) by mouth 2 (two) times daily. 03/03/20   Glean Hess, MD  hydrOXYzine (ATARAX/VISTARIL) 25 MG tablet TAKE 1 TABLET TWICE DAILY AS NEEDED 11/12/19   Glean Hess, MD  ipratropium (ATROVENT) 0.02 % nebulizer solution Take 2.5 mLs (0.5 mg total) by nebulization 4 (four) times daily. 03/03/20   Glean Hess, MD  ipratropium-albuterol (DUONEB) 0.5-2.5 (3) MG/3ML SOLN Take 3 mLs by nebulization 3 (three) times daily as needed. 02/03/20   Enzo Bi, MD  levalbuterol Penne Lash) 0.63 MG/3ML nebulizer solution Take 3 mLs (0.63 mg total) by nebulization in the morning, at noon, in the evening, and at bedtime. 03/03/20   Glean Hess, MD  OXYGEN Inhale 5 L/min into the lungs continuous.    [provider]  TRELEGY ELLIPTA 100-62.5-25 MCG/INH AEPB INHALE 1 PUFF INTO THE LUNGS DAILY. 11/12/19   Glean Hess, MD    Allergies Aspirin, Codeine, Morphine and related, and Cat hair extract  Family History  Problem Relation Age of Onset  . Cancer Mother        lymphoma  . Heart disease Mother   . Cancer Maternal Grandmother         breast  . Birth defects Maternal Grandmother     Social History Social History   Tobacco Use  . Smoking status: Former Smoker    Packs/day: 1.00    Years: 57.00    Pack years: 57.00    Types: Cigarettes    Quit date: 03/30/2016    Years since quitting: 4.0  . Smokeless tobacco: Never Used  . Tobacco comment: Quit in July 2017  Vaping Use  . Vaping Use: Never used  Substance Use Topics  . Alcohol use: No    Alcohol/week: 0.0 standard drinks  . Drug use: No    Review of Systems  Constitutional: No fever/chills. Eyes: No redness. ENT:  No sore throat. Cardiovascular: Positive for chest pain. Respiratory: Positive for shortness of breath. Gastrointestinal: No vomiting or diarrhea.  Genitourinary: Negative for flank pain.  Musculoskeletal: Negative for back pain. Skin: Negative for rash. Neurological: Negative for headache.   ____________________________________________   PHYSICAL EXAM:  VITAL SIGNS: ED Triage Vitals  Enc Vitals Group     BP 04/03/20 1542 (!) 156/95     Pulse Rate 04/03/20 1542 (!) 115     Resp 04/03/20 1542 20     Temp 04/03/20 1542 97.9 F (36.6 C)     Temp Source 04/03/20 1542 Oral     SpO2 04/03/20 1542 95 %     Weight 04/03/20 1539 170 lb 6.7 oz (77.3 kg)     Height 04/03/20 1539 5\' 2"  (1.575 m)     Head Circumference --      Peak Flow --      Pain Score 04/03/20 1539 0     Pain Loc --      Pain Edu? --      Excl. in [place]? --     Constitutional: Alert and oriented.  Somewhat chronically ill-appearing but in no acute distress. Eyes: Conjunctivae are normal.  Head: Atraumatic. Nose: No congestion/rhinnorhea. Mouth/Throat: Mucous membranes are moist.   Neck: Normal range of motion.  Cardiovascular: Tachycardic, regular rhythm. Grossly normal heart sounds.  Good peripheral circulation. Respiratory: Slightly increased respiratory effort.  No retractions.  Diminished breath sounds bilaterally with no significant  wheezing. Gastrointestinal: Soft and nontender. No distention.  Genitourinary: No flank tenderness. Musculoskeletal: No lower extremity edema.  Extremities warm and well perfused.  Neurologic:  Normal speech and language. No gross focal neurologic deficits are appreciated.  Skin:  Skin is warm and dry. No rash noted. Psychiatric: Mood and affect are normal. Speech and behavior are normal.  ____________________________________________   LABS (all labs ordered are listed, but only abnormal results are displayed)  Labs Reviewed  BLOOD GAS, VENOUS - Abnormal; Notable for the following components:      Result Value   pCO2, Ven 41 (*)    All other components within normal limits  COMPREHENSIVE METABOLIC PANEL - Abnormal; Notable for the following components:   Glucose, Bld 146 (*)    Creatinine, Ser 1.05 (*)    Calcium 10.5 (*)    Total Protein 9.1 (*)    GFR calc non Af Amer 52 (*)    All other components within normal limits  CBC WITH DIFFERENTIAL/PLATELET - Abnormal; Notable for the following components:   RBC 5.17 (*)    Hemoglobin 15.7 (*)    Basophils Absolute 0.2 (*)    All other components within normal limits  LACTIC ACID, PLASMA - Abnormal; Notable for the following components:   Lactic Acid, Venous 3.3 (*)    All other components within normal limits  LACTIC ACID, PLASMA - Abnormal; Notable for the following components:   Lactic Acid, Venous 3.9 (*)    All other components within normal limits  SARS CORONAVIRUS 2 BY RT PCR (HOSPITAL ORDER, [facility])  CULTURE, BLOOD (ROUTINE X 2)  CULTURE, BLOOD (ROUTINE X 2)  BRAIN NATRIURETIC PEPTIDE  TROPONIN I (HIGH SENSITIVITY)  TROPONIN I (HIGH SENSITIVITY)   ____________________________________________  EKG  ED ECG REPORT I, Arta Silence, the attending physician, personally viewed and interpreted this ECG.  Date: 04/03/2020 EKG Time: 1543 Rate: 114 Rhythm: normal sinus rhythm QRS  Axis: Right axis Intervals: normal ST/T Wave abnormalities: Nonspecific ST abnormalities  Narrative Interpretation: Nonspecific abnormalities with no evidence of acute ischemia  ____________________________________________  RADIOLOGY  CXR: Patchy opacity in mid and lower right lung, possible pneumonia  ____________________________________________   PROCEDURES  Procedure(s) performed: No  Procedures  Critical Care performed: Yes  CRITICAL CARE Performed by: Arta Silence   Total critical care time: 30 minutes  Critical care time was exclusive of separately billable procedures and treating other patients.  Critical care was necessary to treat or prevent imminent or life-threatening deterioration.  Critical care was time spent personally by me on the following activities: development of treatment plan with patient and/or surrogate as well as nursing, discussions with consultants, evaluation of patient's response to treatment, examination of patient, obtaining history from patient or surrogate, ordering and performing treatments and interventions, ordering and review of laboratory studies, ordering and review of radiographic studies, pulse oximetry and re-evaluation of patient's condition. ____________________________________________   INITIAL IMPRESSION / ASSESSMENT AND PLAN / ED COURSE  Pertinent labs & imaging results that were available during my care of the patient were reviewed by me and considered in my medical decision making (see chart for details).  74 year old female with a history of COPD on 6 L O2 at home presents with worsening shortness of breath as well as some chest pain and tightness.  She has a chronic cough although no recent change, denies fevers.  I reviewed the past medical records in [place].  The patient was most recently admitted in May for respiratory failure related to COPD exacerbation, initially requiring BiPAP.  She was then transitioned to high  flow nasal cannula and discharged on 5 L of O2 by nasal cannula with an O2 saturation in the high 80s.  On exam today, the vital signs are normal except for tachycardia and mild hypertension.  O2 saturation is in the mid 90s on 15 L by nonrebreather.  She is slightly increased work of breathing but no acute respiratory distress and is able to speak in full sentences.  Breath sounds are diminished bilaterally.  Differential includes most likely COPD exacerbation versus pneumonia or acute bronchitis.  I have a low suspicion for COVID-19, or for primary cardiac etiology.  We will obtain chest x-ray, lab work-up, give Solu-Medrol and bronchodilators and reassess.  ----------------------------------------- 7:43 PM on 04/03/2020 -----------------------------------------  Chest x-ray showed findings consistent with right middle and lower lobe pneumonia.  Initially I did not strongly suspect sepsis given that the patient was afebrile with no elevated WBC count, although she was tachycardic.  The initial lactate was elevated, and the repeat was obtained before the patient gotten her full fluid bolus, so was similarly elevated.  Given that she was admitted 2 months ago, I ordered broad-spectrum antibiotics for HCAP coverage and a fluid bolus per sepsis protocol.  The Covid swab is negative.  The patient is doing well on 10 L high flow nasal cannula.  I discussed her case with the hospitalist Dr. Sidney Ace for admission.  ____________________________________________   FINAL CLINICAL IMPRESSION(S) / ED DIAGNOSES  Final diagnoses:  COPD exacerbation ([place] )  HCAP (healthcare-associated pneumonia)      NEW MEDICATIONS STARTED DURING THIS VISIT:  New Prescriptions   No medications on file     Note:  This document was prepared using Dragon voice recognition software and may include unintentional dictation errors.    Arta Silence, MD 04/03/20 1944

## 2020-04-03 NOTE — Progress Notes (Signed)
PHARMACY -  BRIEF ANTIBIOTIC NOTE   Pharmacy has received consult(s) for Vancomycin, Cefepime from an ED provider.  The patient's profile has been reviewed for ht/wt/allergies/indication/available labs.    One time order(s) placed for Vancomycin 1750 mg IV X 1 and Cefepime 2 gm IV X 1.   Further antibiotics/pharmacy consults should be ordered by admitting physician if indicated.                       Thank you, Adrine Hayworth D 04/03/2020  6:27 PM

## 2020-04-03 NOTE — ED Triage Notes (Signed)
Pt here via [facility] with SOB at home that started aroound 5 am. O2 sats in the 70s on 3L at home. 2 albuterols and 125 of solumedrol. Hx of htn, copd, emphysema.

## 2020-04-03 NOTE — ED Notes (Signed)
Attempted to call report at this time 

## 2020-04-04 ENCOUNTER — Encounter: Payer: Self-pay | Admitting: Family Medicine

## 2020-04-04 ENCOUNTER — Other Ambulatory Visit: Payer: Self-pay

## 2020-04-04 LAB — CBC
HCT: 37.2 % (ref 36.0–46.0)
Hemoglobin: 12.5 g/dL (ref 12.0–15.0)
MCH: 30 pg (ref 26.0–34.0)
MCHC: 33.6 g/dL (ref 30.0–36.0)
MCV: 89.4 fL (ref 80.0–100.0)
Platelets: 246 10*3/uL (ref 150–400)
RBC: 4.16 MIL/uL (ref 3.87–5.11)
RDW: 14.8 % (ref 11.5–15.5)
WBC: 6.3 10*3/uL (ref 4.0–10.5)
nRBC: 0 % (ref 0.0–0.2)

## 2020-04-04 LAB — BLOOD CULTURE ID PANEL (REFLEXED)

## 2020-04-04 LAB — BASIC METABOLIC PANEL
Anion gap: 8 (ref 5–15)
BUN: 8 mg/dL (ref 8–23)
CO2: 22 mmol/L (ref 22–32)
Calcium: 9.3 mg/dL (ref 8.9–10.3)
Chloride: 112 mmol/L — ABNORMAL HIGH (ref 98–111)
Creatinine, Ser: 0.87 mg/dL (ref 0.44–1.00)
GFR calc Af Amer: >60 mL/min (ref >60)
GFR calc non Af Amer: >60 mL/min (ref >60)
Glucose, Bld: 149 mg/dL — ABNORMAL HIGH (ref 70–99)
Potassium: 4.5 mmol/L (ref 3.5–5.1)
Sodium: 142 mmol/L (ref 135–145)

## 2020-04-04 LAB — STREP PNEUMONIAE URINARY ANTIGEN: Strep Pneumo Urinary Antigen: NEGATIVE

## 2020-04-04 MED ORDER — CHLORHEXIDINE GLUCONATE CLOTH 2 % EX PADS
6.0000 | MEDICATED_PAD | Freq: Every day | CUTANEOUS | Status: DC
  Administered 2020-04-06 – 2020-04-12 (×7): 6 via TOPICAL

## 2020-04-04 MED ORDER — CEFAZOLIN SODIUM-DEXTROSE 2-4 GM/100ML-% IV SOLN
2.0000 g | Freq: Three times a day (TID) | INTRAVENOUS | Status: DC
  Administered 2020-04-05 – 2020-04-08 (×10): 2 g via INTRAVENOUS
  Filled 2020-04-04 (×14): qty 100

## 2020-04-04 MED ORDER — METHYLPREDNISOLONE SODIUM SUCC 40 MG IJ SOLR
40.0000 mg | Freq: Four times a day (QID) | INTRAMUSCULAR | Status: DC
  Administered 2020-04-04 – 2020-04-06 (×9): 40 mg via INTRAVENOUS
  Filled 2020-04-04 (×10): qty 1

## 2020-04-04 NOTE — Progress Notes (Signed)
PHARMACY - PHYSICIAN COMMUNICATION CRITICAL VALUE ALERT - BLOOD CULTURE IDENTIFICATION (BCID)  MAYRIN SCHMUCK is an 74 y.o. female who presented to [facility] on 04/03/2020  Assessment:  1/4 bottles MSSA (per lab call, no update in Epic yet)  Name of physician (or Provider) Contacted: Dr. Jimmye Norman  Current antibiotics: Rocephin/azithromycin  Changes to prescribed antibiotics recommended: Rocephin to [place], PharmD 04/04/2020  5:48 PM

## 2020-04-04 NOTE — Progress Notes (Signed)
Switched pt to heated HFNC at 70% & 35 LPM to improve WOB & minimize desat episodes.

## 2020-04-04 NOTE — Progress Notes (Signed)
PROGRESS NOTE    Carol Gay  GLO:[id number] DOB: Nov 15, 1945 DOA: 04/03/2020 PCP: Glean Hess, MD    Assessment & Plan:   Active Problems:   Pneumonia  RLL pneumonia: continue on IV rocephin & azithromycin. Continue on bronchodilators. Encourage incentive spirometry & flutter valve. Continue on supplemental oxygen and wean as tolerated  COPD exacerbation: severe. Continue on IV solumedrol, bronchodilators & IV azithromycin. Continue on supplemental oxygen and wean as tolerated. Previous smoker but quit 3-4 years ago   Acute on chronic hypoxic respiratory failure: secondary to above. At home pt is on 6L [place] and currently is on 60 FiO2 HFNC  HTN: continue on amlodipine. IV labetalol prn    DVT prophylaxis: lovenox  Code Status: full  Family Communication: Disposition Plan: depends on PT/OT recs  Status is: Inpatient  Remains inpatient appropriate because:IV treatments appropriate due to intensity of illness or inability to take PO   Dispo: The patient is from: Home              Anticipated d/c is to: Home vs SNF               Anticipated d/c date is: 3 days              Patient currently is not medically stable to d/c.    Consultants:      Procedures:    Antimicrobials: rocephin, azithromycin   Subjective: Pt c/o shortness of breath   Objective: Vitals:   04/04/20 0005 04/04/20 0034 04/04/20 0234 04/04/20 0618  BP: (!) 144/98  (!) 139/59 (!) 151/61  Pulse: (!) 108  (!) 102 98  Resp: (!) 24  18 20   Temp:   97.8 F (36.6 C) 97.9 F (36.6 C)  TempSrc:    Oral  SpO2: 93% 95% 96% 98%  Weight:      Height:        Intake/Output Summary (Last 24 hours) at 04/04/2020 0748 Last data filed at 04/04/2020 0300 Gross per 24 hour  Intake 2686.55 ml  Output 400 ml  Net 2286.55 ml   Filed Weights   04/03/20 1539 04/03/20 2203  Weight: 77.3 kg 78.1 kg    Examination:  General exam: Appears calm and comfortable  Respiratory system: course breath  sounds b/l Cardiovascular system: S1 & S2+. No rubs, gallops or clicks.  Gastrointestinal system: Abdomen is obese, soft and nontender.  Normal bowel sounds heard. Central nervous system: Alert and oriented. Moves all 4 extremities  Psychiatry: Judgement and insight appear normal. Flat mood and affect      Data Reviewed: I have personally reviewed following labs and imaging studies  CBC: Recent Labs  Lab 04/03/20 1543 04/04/20 0538  WBC 9.4 6.3  NEUTROABS 6.2  --   HGB 15.7* 12.5  HCT 45.8 37.2  MCV 88.6 89.4  PLT 312 188   Basic Metabolic Panel: Recent Labs  Lab 04/03/20 1543 04/04/20 0538  NA 139 142  K 4.0 4.5  CL 103 112*  CO2 26 22  GLUCOSE 146* 149*  BUN 9 8  CREATININE 1.05* 0.87  CALCIUM 10.5* 9.3   GFR: Estimated Creatinine Clearance: 54.9 mL/min (by C-G formula based on SCr of 0.87 mg/dL). Liver Function Tests: Recent Labs  Lab 04/03/20 1543  AST 26  ALT 14  ALKPHOS 84  BILITOT 1.0  PROT 9.1*  ALBUMIN 4.4   No results for input(s): LIPASE, AMYLASE in the last 168 hours. No results for input(s): AMMONIA in the last  168 hours. Coagulation Profile: No results for input(s): INR, PROTIME in the last 168 hours. Cardiac Enzymes: No results for input(s): CKTOTAL, CKMB, CKMBINDEX, TROPONINI in the last 168 hours. BNP (last 3 results) No results for input(s): PROBNP in the last 8760 hours. HbA1C: No results for input(s): HGBA1C in the last 72 hours. CBG: No results for input(s): GLUCAP in the last 168 hours. Lipid Profile: No results for input(s): CHOL, HDL, LDLCALC, TRIG, CHOLHDL, LDLDIRECT in the last 72 hours. Thyroid Function Tests: No results for input(s): TSH, T4TOTAL, FREET4, T3FREE, THYROIDAB in the last 72 hours. Anemia Panel: No results for input(s): VITAMINB12, FOLATE, FERRITIN, TIBC, IRON, RETICCTPCT in the last 72 hours. Sepsis Labs: Recent Labs  Lab 04/03/20 1704 04/03/20 1813  LATICACIDVEN 3.3* 3.9*    Recent Results (from the  past 240 hour(s))  SARS Coronavirus 2 by RT PCR (hospital order, performed in [facility] hospital lab) Nasopharyngeal Nasopharyngeal Swab     Status: None   Collection Time: 04/03/20  5:04 PM   Specimen: Nasopharyngeal Swab  Result Value Ref Range Status   SARS Coronavirus 2 NEGATIVE NEGATIVE Final    Comment: (NOTE) SARS-CoV-2 target nucleic acids are NOT DETECTED.  The SARS-CoV-2 RNA is generally detectable in upper and lower respiratory specimens during the acute phase of infection. The lowest concentration of SARS-CoV-2 viral copies this assay can detect is 250 copies / mL. A negative result does not preclude SARS-CoV-2 infection and should not be used as the sole basis for treatment or other patient management decisions.  A negative result may occur with improper specimen collection / handling, submission of specimen other than nasopharyngeal swab, presence of viral mutation(s) within the areas targeted by this assay, and inadequate number of viral copies (<250 copies / mL). A negative result must be combined with clinical observations, patient history, and epidemiological information.  Fact Sheet for Patients:   StrictlyIdeas.no  Fact Sheet for Healthcare Providers: BankingDealers.co.za  This test is not yet approved or  cleared by the [place] FDA and has been authorized for detection and/or diagnosis of SARS-CoV-2 by FDA under an Emergency Use Authorization (EUA).  This EUA will remain in effect (meaning this test can be used) for the duration of the COVID-19 declaration under Section 564(b)(1) of the Act, 21 U.S.C. section 360bbb-3(b)(1), unless the authorization is terminated or revoked sooner.  Performed at [facility], [address] [place], [place] [address]   Culture, blood (routine x 2)     Status: None (Preliminary result)   Collection Time: 04/03/20  5:23 PM   Specimen: BLOOD  Result Value Ref  Range Status   Specimen Description BLOOD  Final   Special Requests NONE  Final   Culture   Final    NO GROWTH < 12 HOURS Performed at [facility], [address] [place], [place] [address]    Report Status PENDING  Incomplete  Culture, blood (routine x 2)     Status: None (Preliminary result)   Collection Time: 04/03/20  5:24 PM   Specimen: BLOOD  Result Value Ref Range Status   Specimen Description BLOOD  Final   Special Requests NONE  Final   Culture   Final    NO GROWTH < 12 HOURS Performed at [facility], [address], [place], [place] [address]    Report Status PENDING  Incomplete         Radiology Studies: DG Chest 1 View  Result Date: 04/03/2020 CLINICAL DATA:  Shortness of breath EXAM: CHEST  1 VIEW COMPARISON:  01/28/2020 FINDINGS: Right-sided chest port in stable positioning. Heart size is normal. Atherosclerotic calcification of the aortic knob. Interval development of patchy and interstitial opacities within the mid to lower aspect of the right lung. Left lung is hyperexpanded and clear. Scattered granulomas are unchanged. No pleural effusion or pneumothorax identified. IMPRESSION: Interval development of patchy and interstitial opacities within the mid to lower aspect of the right lung. Findings suspicious for pneumonia. Radiographic follow-up to resolution is recommended. Electronically Signed   By: Davina Poke D.O.   On: 04/03/2020 16:24        Scheduled Meds: . amLODipine  5 mg Oral Daily  . atorvastatin  20 mg Oral QHS  . cyclobenzaprine  10 mg Oral BID  . enoxaparin (LOVENOX) injection  40 mg Subcutaneous Q24H  . guaiFENesin  600 mg Oral BID  . ipratropium-albuterol  3 mL Nebulization QID  . methylPREDNISolone (SOLU-MEDROL) injection  40 mg Intravenous Q8H   Continuous Infusions: . sodium chloride 100 mL/hr at 04/03/20 2359  . azithromycin Stopped (04/04/20 5009)  . cefTRIAXone (ROCEPHIN)  IV 2 g (04/04/20 0624)      LOS: 1 day    Time spent: 33 mins     Wyvonnia Dusky, MD [facility] Pager [phone]  If 7PM-7AM, please contact night-coverage [email or web address] 04/04/2020, 7:48 AM

## 2020-04-05 ENCOUNTER — Inpatient Hospital Stay: Payer: Medicare HMO

## 2020-04-05 ENCOUNTER — Inpatient Hospital Stay (HOSPITAL_COMMUNITY)
Admit: 2020-04-05 | Discharge: 2020-04-05 | Disposition: A | Discharge status: Home / Self Care | Payer: Medicare HMO | Attending: Internal Medicine | Admitting: Internal Medicine

## 2020-04-05 DIAGNOSIS — I361 Nonrheumatic tricuspid (valve) insufficiency: Secondary | ICD-10-CM

## 2020-04-05 DIAGNOSIS — D72829 Elevated white blood cell count, unspecified: Secondary | ICD-10-CM

## 2020-04-05 LAB — BASIC METABOLIC PANEL
Anion gap: 11 (ref 5–15)
BUN: 12 mg/dL (ref 8–23)
CO2: 23 mmol/L (ref 22–32)
Calcium: 10 mg/dL (ref 8.9–10.3)
Chloride: 107 mmol/L (ref 98–111)
Creatinine, Ser: 0.76 mg/dL (ref 0.44–1.00)
GFR calc Af Amer: >60 mL/min (ref >60)
GFR calc non Af Amer: >60 mL/min (ref >60)
Glucose, Bld: 142 mg/dL — ABNORMAL HIGH (ref 70–99)
Potassium: 3.5 mmol/L (ref 3.5–5.1)
Sodium: 141 mmol/L (ref 135–145)

## 2020-04-05 LAB — CBC
HCT: 40.4 % (ref 36.0–46.0)
Hemoglobin: 13.8 g/dL (ref 12.0–15.0)
MCH: 30.4 pg (ref 26.0–34.0)
MCHC: 34.2 g/dL (ref 30.0–36.0)
MCV: 89 fL (ref 80.0–100.0)
Platelets: 267 10*3/uL (ref 150–400)
RBC: 4.54 MIL/uL (ref 3.87–5.11)
RDW: 15 % (ref 11.5–15.5)
WBC: 12.5 10*3/uL — ABNORMAL HIGH (ref 4.0–10.5)
nRBC: 0 % (ref 0.0–0.2)

## 2020-04-05 LAB — LEGIONELLA PNEUMOPHILA SEROGP 1 UR AG: L. pneumophila Serogp 1 Ur Ag: NEGATIVE

## 2020-04-05 LAB — RESPIRATORY PANEL BY PCR

## 2020-04-05 LAB — ECHOCARDIOGRAM COMPLETE
AR max vel: 1.47 cm2
AV Area VTI: 1.31 cm2
AV Area mean vel: 1.25 cm2
AV Mean grad: 5 mmHg
AV Peak grad: 7 mmHg
Ao pk vel: 1.32 m/s
Area-P 1/2: 5 cm2
Height: 62 in
S' Lateral: 2.65 cm
Weight: 2779.2 oz

## 2020-04-05 LAB — CULTURE, BLOOD (ROUTINE X 2)

## 2020-04-05 LAB — MRSA PCR SCREENING: MRSA by PCR: NEGATIVE

## 2020-04-05 LAB — PROCALCITONIN: Procalcitonin: <0.1 ng/mL

## 2020-04-05 LAB — GLUCOSE, CAPILLARY: Glucose-Capillary: 115 mg/dL — ABNORMAL HIGH (ref 70–99)

## 2020-04-05 MED ORDER — IPRATROPIUM-ALBUTEROL 0.5-2.5 (3) MG/3ML IN SOLN
3.0000 mL | Freq: Four times a day (QID) | RESPIRATORY_TRACT | Status: DC
  Administered 2020-04-05 – 2020-04-08 (×11): 3 mL via RESPIRATORY_TRACT
  Filled 2020-04-05 (×11): qty 3

## 2020-04-05 MED ORDER — FUROSEMIDE 10 MG/ML IJ SOLN
20.0000 mg | Freq: Every day | INTRAMUSCULAR | Status: DC
  Administered 2020-04-05 – 2020-04-07 (×3): 20 mg via INTRAVENOUS
  Filled 2020-04-05 (×3): qty 2

## 2020-04-05 NOTE — Progress Notes (Addendum)
PROGRESS NOTE    Carol Gay  LOV:[id number] DOB: 09-02-1946 DOA: 04/03/2020 PCP: Glean Hess, MD    Assessment & Plan:   Active Problems:   Pneumonia   RLL pneumonia: continue on IV ancef & azithromycin. Continue on bronchodilators. Encourage incentive spirometry & flutter valve. Continue on supplemental oxygen and wean as tolerated, currently on HFNC. Pulmon consulted   COPD exacerbation: severe. Continue on IV solumedrol, bronchodilators & IV azithromycin. Continue on supplemental oxygen and wean as tolerated, currently on HFNC. Previous smoker but quit 3-4 years ago. Pulmon consulted   Acute on chronic hypoxic respiratory failure: secondary to above. At home pt is on 6L [place] and currently 45 HFNC. Worsening today, pulmon consulted   Unlikely bacteremia: growing 1/4 gram positive cocci, stap capitis, likely containment. Repeat blood cxs pending. Echo ordered. Continue on IV ancef, azithromycin. Etiology unclear. Urine, sputum cx pending  Leukocytosis: likely secondary to infection. Continue on IV abxs   HTN: continue on amlodipine. IV labetalol prn    DVT prophylaxis: lovenox  Code Status: full  Family Communication: Disposition Plan: depends on PT/OT recs  Status is: Inpatient  Remains inpatient appropriate because:IV treatments appropriate due to intensity of illness or inability to take PO   Dispo: The patient is from: Home              Anticipated d/c is to: Home vs SNF               Anticipated d/c date is: 3 days              Patient currently is not medically stable to d/c.    Consultants:   pulmon   Procedures:    Antimicrobials: ancef, azithromycin   Subjective: Pt c/o shortness of breath, worse than day prior   Objective: Vitals:   04/04/20 2028 04/04/20 2046 04/05/20 0346 04/05/20 0444  BP:  130/63  (!) 143/68  Pulse:  (!) 108  100  Resp:  20  20  Temp:  98.1 F (36.7 C)  98 F (36.7 C)  TempSrc:  Oral    SpO2: 92% 94%  95%    Weight:   78.8 kg   Height:        Intake/Output Summary (Last 24 hours) at 04/05/2020 0810 Last data filed at 04/05/2020 0347 Gross per 24 hour  Intake --  Output 900 ml  Net -900 ml   Filed Weights   04/03/20 1539 04/03/20 2203 04/05/20 0346  Weight: 77.3 kg 78.1 kg 78.8 kg    Examination:  General exam: Appears calm but uncomfortable  Respiratory system: course breath sounds b/l Cardiovascular system: S1 & S2+. No rubs, gallops or clicks.  Gastrointestinal system: Abdomen is obese, soft and nontender.  Hypoactive bowel sounds heard. Central nervous system: Alert and oriented. Moves all 4 extremities  Psychiatry: Judgement and insight appear normal. Flat mood and affect      Data Reviewed: I have personally reviewed following labs and imaging studies  CBC: Recent Labs  Lab 04/03/20 1543 04/04/20 0538 04/05/20 0402  WBC 9.4 6.3 12.5*  NEUTROABS 6.2  --   --   HGB 15.7* 12.5 13.8  HCT 45.8 37.2 40.4  MCV 88.6 89.4 89.0  PLT 312 246 884   Basic Metabolic Panel: Recent Labs  Lab 04/03/20 1543 04/04/20 0538 04/05/20 0402  NA 139 142 141  K 4.0 4.5 3.5  CL 103 112* 107  CO2 26 22 23   GLUCOSE 146* 149* 142*  BUN 9 8 12   CREATININE 1.05* 0.87 0.76  CALCIUM 10.5* 9.3 10.0   GFR: Estimated Creatinine Clearance: 60 mL/min (by C-G formula based on SCr of 0.76 mg/dL). Liver Function Tests: Recent Labs  Lab 04/03/20 1543  AST 26  ALT 14  ALKPHOS 84  BILITOT 1.0  PROT 9.1*  ALBUMIN 4.4   No results for input(s): LIPASE, AMYLASE in the last 168 hours. No results for input(s): AMMONIA in the last 168 hours. Coagulation Profile: No results for input(s): INR, PROTIME in the last 168 hours. Cardiac Enzymes: No results for input(s): CKTOTAL, CKMB, CKMBINDEX, TROPONINI in the last 168 hours. BNP (last 3 results) No results for input(s): PROBNP in the last 8760 hours. HbA1C: No results for input(s): HGBA1C in the last 72 hours. CBG: No results for input(s):  GLUCAP in the last 168 hours. Lipid Profile: No results for input(s): CHOL, HDL, LDLCALC, TRIG, CHOLHDL, LDLDIRECT in the last 72 hours. Thyroid Function Tests: No results for input(s): TSH, T4TOTAL, FREET4, T3FREE, THYROIDAB in the last 72 hours. Anemia Panel: No results for input(s): VITAMINB12, FOLATE, FERRITIN, TIBC, IRON, RETICCTPCT in the last 72 hours. Sepsis Labs: Recent Labs  Lab 04/03/20 1704 04/03/20 1813  LATICACIDVEN 3.3* 3.9*    Recent Results (from the past 240 hour(s))  SARS Coronavirus 2 by RT PCR (hospital order, performed in [facility] hospital lab) Nasopharyngeal Nasopharyngeal Swab     Status: None   Collection Time: 04/03/20  5:04 PM   Specimen: Nasopharyngeal Swab  Result Value Ref Range Status   SARS Coronavirus 2 NEGATIVE NEGATIVE Final    Comment: (NOTE) SARS-CoV-2 target nucleic acids are NOT DETECTED.  The SARS-CoV-2 RNA is generally detectable in upper and lower respiratory specimens during the acute phase of infection. The lowest concentration of SARS-CoV-2 viral copies this assay can detect is 250 copies / mL. A negative result does not preclude SARS-CoV-2 infection and should not be used as the sole basis for treatment or other patient management decisions.  A negative result may occur with improper specimen collection / handling, submission of specimen other than nasopharyngeal swab, presence of viral mutation(s) within the areas targeted by this assay, and inadequate number of viral copies (<250 copies / mL). A negative result must be combined with clinical observations, patient history, and epidemiological information.  Fact Sheet for Patients:   StrictlyIdeas.no  Fact Sheet for Healthcare Providers: BankingDealers.co.za  This test is not yet approved or  cleared by the [place] FDA and has been authorized for detection and/or diagnosis of SARS-CoV-2 by FDA under an Emergency Use  Authorization (EUA).  This EUA will remain in effect (meaning this test can be used) for the duration of the COVID-19 declaration under Section 564(b)(1) of the Act, 21 U.S.C. section 360bbb-3(b)(1), unless the authorization is terminated or revoked sooner.  Performed at [facility], [address] [place], [place] [address]   Culture, blood (routine x 2)     Status: None (Preliminary result)   Collection Time: 04/03/20  5:23 PM   Specimen: BLOOD  Result Value Ref Range Status   Specimen Description BLOOD  Final   Special Requests NONE  Final   Culture   Final    NO GROWTH 2 DAYS Performed at [facility], [address] [place], [place] [address]    Report Status PENDING  Incomplete  Culture, blood (routine x 2)     Status: None (Preliminary result)   Collection Time: 04/03/20  5:24 PM  Specimen: BLOOD LEFT HAND  Result Value Ref Range Status   Specimen Description   Final    BLOOD LEFT HAND Performed at [facility], [place] [address] [place], [place] [address]    Special Requests NONE  Final   Culture  Setup Time   Final    Organism ID to follow GRAM POSITIVE COCCI AEROBIC BOTTLE ONLY CRITICAL RESULT CALLED TO, READ BACK BY AND VERIFIED WITH: ABBY ELLINGTON 04/04/20 AT 1750 BY AR Performed at [facility], [address] [place], [place] [address]    Culture [facility] POSITIVE COCCI  Final   Report Status PENDING  Incomplete  Blood Culture ID Panel (Reflexed)     Status: Abnormal   Collection Time: 04/03/20  5:24 PM  Result Value Ref Range Status   Enterococcus species NOT DETECTED NOT DETECTED Final   Listeria monocytogenes NOT DETECTED NOT DETECTED Final   Staphylococcus species DETECTED (A) NOT DETECTED Final    Comment: Methicillin (oxacillin) susceptible coagulase negative staphylococcus. Possible blood culture contaminant (unless isolated from more than one blood culture draw or clinical case suggests pathogenicity). No antibiotic  treatment is indicated for blood  culture contaminants. CRITICAL RESULT CALLED TO, READ BACK BY AND VERIFIED WITH: ABBY ELLINGTON 04/04/20 @1730  BY ACR    Staphylococcus aureus (BCID) NOT DETECTED NOT DETECTED Final   Methicillin resistance NOT DETECTED NOT DETECTED Final   Streptococcus species NOT DETECTED NOT DETECTED Final   Streptococcus agalactiae NOT DETECTED NOT DETECTED Final   Streptococcus pneumoniae NOT DETECTED NOT DETECTED Final   Streptococcus pyogenes NOT DETECTED NOT DETECTED Final   Acinetobacter baumannii NOT DETECTED NOT DETECTED Final   Enterobacteriaceae species NOT DETECTED NOT DETECTED Final   Enterobacter cloacae complex NOT DETECTED NOT DETECTED Final   Escherichia coli NOT DETECTED NOT DETECTED Final   Klebsiella oxytoca NOT DETECTED NOT DETECTED Final   Klebsiella pneumoniae NOT DETECTED NOT DETECTED Final   Proteus species NOT DETECTED NOT DETECTED Final   Serratia marcescens NOT DETECTED NOT DETECTED Final   Haemophilus influenzae NOT DETECTED NOT DETECTED Final   Neisseria meningitidis NOT DETECTED NOT DETECTED Final   Pseudomonas aeruginosa NOT DETECTED NOT DETECTED Final   Candida albicans NOT DETECTED NOT DETECTED Final   Candida glabrata NOT DETECTED NOT DETECTED Final   Candida krusei NOT DETECTED NOT DETECTED Final   Candida parapsilosis NOT DETECTED NOT DETECTED Final   Candida tropicalis NOT DETECTED NOT DETECTED Final    Comment: Performed at [facility], [address], [place], [place] [address]         Radiology Studies: DG Chest 1 View  Result Date: 04/03/2020 CLINICAL DATA:  Shortness of breath EXAM: CHEST  1 VIEW COMPARISON:  01/28/2020 FINDINGS: Right-sided chest port in stable positioning. Heart size is normal. Atherosclerotic calcification of the aortic knob. Interval development of patchy and interstitial opacities within the mid to lower aspect of the right lung. Left lung is hyperexpanded and clear. Scattered  granulomas are unchanged. No pleural effusion or pneumothorax identified. IMPRESSION: Interval development of patchy and interstitial opacities within the mid to lower aspect of the right lung. Findings suspicious for pneumonia. Radiographic follow-up to resolution is recommended. Electronically Signed   By: Davina Poke D.O.   On: 04/03/2020 16:24        Scheduled Meds: . amLODipine  5 mg Oral Daily  . atorvastatin  20 mg Oral QHS  . Chlorhexidine Gluconate Cloth  6 each Topical Daily  . cyclobenzaprine  10  mg Oral BID  . enoxaparin (LOVENOX) injection  40 mg Subcutaneous Q24H  . guaiFENesin  600 mg Oral BID  . ipratropium-albuterol  3 mL Nebulization QID  . methylPREDNISolone (SOLU-MEDROL) injection  40 mg Intravenous Q6H   Continuous Infusions: . sodium chloride 100 mL/hr at 04/05/20 0027  . azithromycin 500 mg (04/04/20 2035)  .  ceFAZolin (ANCEF) IV 2 g (04/05/20 0610)     LOS: 2 days    Time spent: 32 mins     Wyvonnia Dusky, MD [facility] Pager [phone]  If 7PM-7AM, please contact night-coverage [email or web address] 04/05/2020, 8:10 AM

## 2020-04-05 NOTE — Plan of Care (Signed)
  Problem: Respiratory: Goal: Ability to maintain a clear airway will improve Outcome: Progressing Goal: Levels of oxygenation will improve Outcome: Progressing Goal: Ability to maintain adequate ventilation will improve Outcome: Progressing

## 2020-04-05 NOTE — Progress Notes (Signed)
Patient on HFNC. 65%45L now.  o2 increased due to work of breathing with exertion to bedside commode. SVN was given prior to exertion. Patient desaturation noted in low 70's.  Had to place nonrebreather over the HFNC to increase sats and decrease wob.  Advised patient to allow purwick catheter to alleviate bedside commode distress.  Discussed possible bipap due to oxygen requirements and wob.  Patient states she has worn bipap before and did poorly.  She states her breathing worsened with bipap and she had a pressure sore on nose.  She states she does not want to wear that device but is willing to use catheter.  Will continue to monitor.

## 2020-04-05 NOTE — Consult Note (Signed)
Pulmonary Medicine          Date: 04/05/2020,   MRN# [id number] Carol Gay May 31, 1946     AdmissionWeight: 77.3 kg                 CurrentWeight: 78.8 kg   Referring physician: Dr Jimmye Norman    CHIEF COMPLAINT:   Acute on chronic hypoxemic respiratory failure   HISTORY OF PRESENT ILLNESS   This is a 74 F with hx of Advanced COPD with chronic hypoxemia on 6L/min supplemental O2 at home who came in due to respiratory distress with acute on chronic hypoxemia. She has multiple comorbid conditions as below. She had workup in ED revealing reassuring ABG findings absence of fevers, negative COVID swab and serology within reference range. She was in sinus tach on EKG with tachypnea and required 11L HFNC to reach noromoxia. She received Vanc/Cefepime empirically as well as IV solumedrol for probable AECOPD. CXR was performed showing worsening right sided airspace opacification suggestive of CAP. Due to progressive respiratory distress and increased O2 requirement pulmonary consultaion was placed for further evaluation and management. Patient has been on O2 at home for years, reporting over past 2 years she increased from 2L to 6L after chemo/radiation. She has denies any LRTI in past 2 years.  Patient has been seen by [facility] pulmonary in the past. She denies choking on food. She denies having high volume phlegm but does reports unusual dyspnea and increased O2.  She does not take vaccination and has not been vaccinated for COVID. During my interview she became hypoxemic while speaking.    PAST MEDICAL HISTORY   Past Medical History:  Diagnosis Date  . Aortic atherosclerosis ([place])   . Arm pain   . Asthma   . Benign neoplasm of rectosigmoid junction   . Benign neoplasm of transverse colon   . CAD in native artery   . Cancer ([place]) 09/2015   rectum  . Cigarette smoker   . Claustrophobia   . Colon cancer ([place])   . 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 COPD (chronic obstructive pulmonary disease) ([place])   . DDD (degenerative disc disease), lumbar       . Dermatophytoses   . Dyslipidemia   . H/O blood clots    left leg, veins stripped  . Hemorrhoids   . Hypertension   . Last menstrual period (LMP) > 10 days ago 1994  . Myocardial infarction [facility])    "mild" - age 74  . Personal history of chemotherapy   . Personal history of radiation therapy   . Pulmonary emphysema ([place])   . Rectal bleeding   . Shortness of breath dyspnea   . Smokers' cough ([place])   . Spinal headache    with one of my children's birth  . Vertigo   . Wears dentures    has full upper and lower - doesn't wear     SURGICAL HISTORY   Past Surgical History:  Procedure Laterality Date  . APPENDECTOMY    . BREAST BIOPSY Left 1970's   neg  . COLONOSCOPY WITH PROPOFOL N/A 11/06/2015   Procedure: COLONOSCOPY WITH PROPOFOL;  Surgeon: Lucilla Lame, MD;  Location: [facility];  Service: Endoscopy;  Laterality: N/A;  LEAVE PT EARLY  . EXPLORATORY LAPAROTOMY    . LESION EXCISION  11/03/15   Anal - Dr Dahlia Byes, [facility]  . LESION REMOVAL  1970's  from tailbone  . [place] SURGERY  1997  . OVARIAN CYST REMOVAL Left   . POLYPECTOMY  11/06/2015   Procedure: POLYPECTOMY;  Surgeon: Lucilla Lame, MD;  Location: [facility];  Service: Endoscopy;;  . PORTACATH PLACEMENT N/A 12/10/2015   Procedure: INSERTION PORT-A-CATH;  Surgeon: Jules Husbands, MD;  Location: [facility];  Service: General;  Laterality: N/A;  . RECTAL EXAM UNDER ANESTHESIA N/A 06/23/2016   Procedure: RECTAL EXAM UNDER ANESTHESIA;  Surgeon: Jules Husbands, MD;  Location: [facility];  Service: General;  Laterality: N/A;  . VEIN LIGATION AND STRIPPING       FAMILY HISTORY   Family History  Problem Relation Age of Onset  . Cancer Mother        lymphoma  . Heart disease Mother   . Cancer Maternal Grandmother        breast  . Birth defects Maternal Grandmother      SOCIAL HISTORY   Social  History   Tobacco Use  . Smoking status: Former Smoker    Packs/day: 1.00    Years: 57.00    Pack years: 57.00    Types: Cigarettes    Quit date: 03/30/2016    Years since quitting: 4.0  . Smokeless tobacco: Never Used  . Tobacco comment: Quit in July 2017  Vaping Use  . Vaping Use: Never used  Substance Use Topics  . Alcohol use: No    Alcohol/week: 0.0 standard drinks  . Drug use: No     MEDICATIONS    Home Medication:    Current Medication:  Current Facility-Administered Medications:  .  0.9 %  sodium chloride infusion, , Intravenous, Continuous, Mansy, Jan A, MD, Last Rate: 100 mL/hr at 04/05/20 1148, New Bag at 04/05/20 1148 .  acetaminophen (TYLENOL) tablet 650 mg, 650 mg, Oral, Q6H PRN, 650 mg at 04/05/20 0456 **OR** acetaminophen (TYLENOL) suppository 650 mg, 650 mg, Rectal, Q6H PRN, Mansy, Jan A, MD .  amLODipine (NORVASC) tablet 5 mg, 5 mg, Oral, Daily, Mansy, Jan A, MD, 5 mg at 04/05/20 0932 .  atorvastatin (LIPITOR) tablet 20 mg, 20 mg, Oral, QHS, Mansy, Jan A, MD, 20 mg at 04/04/20 2216 .  azithromycin (ZITHROMAX) 500 mg in sodium chloride 0.9 % 250 mL IVPB, 500 mg, Intravenous, Q24H, Mansy, Jan A, MD, Last Rate: 250 mL/hr at 04/04/20 2035, 500 mg at 04/04/20 2035 .  calcium carbonate (TUMS - dosed in mg elemental calcium) chewable tablet 200 mg of elemental calcium, 1 tablet, Oral, TID PRN, Mansy, Jan A, MD, 200 mg of elemental calcium at 04/04/20 1848 .  ceFAZolin (ANCEF) IVPB 2g/100 mL premix, 2 g, Intravenous, Q8H, Wyvonnia Dusky, MD, Last Rate: 200 mL/hr at 04/05/20 0610, 2 g at 04/05/20 0610 .  Chlorhexidine Gluconate Cloth 2 % PADS 6 each, 6 each, Topical, Daily, Wyvonnia Dusky, MD .  cyclobenzaprine (FLEXERIL) tablet 10 mg, 10 mg, Oral, BID, Mansy, Jan A, MD, 10 mg at 04/05/20 0932 .  enoxaparin (LOVENOX) injection 40 mg, 40 mg, Subcutaneous, Q24H, Mansy, Jan A, MD, 40 mg at 04/04/20 2218 .  guaiFENesin (MUCINEX) 12 hr tablet 600 mg, 600 mg, Oral,  BID, Mansy, Jan A, MD, 600 mg at 04/05/20 0932 .  guaiFENesin-dextromethorphan (ROBITUSSIN DM) 100-10 MG/5ML syrup 5 mL, 5 mL, Oral, Q4H PRN, Mansy, Jan A, MD .  hydrOXYzine (ATARAX/VISTARIL) tablet 25 mg, 25 mg, Oral, BID PRN, Mansy, Jan A, MD .  ipratropium-albuterol (DUONEB) 0.5-2.5 (3) MG/3ML nebulizer solution 3 mL,  3 mL, Nebulization, QID, Mansy, Jan A, MD, 3 mL at 04/05/20 1101 .  labetalol (NORMODYNE) injection 20 mg, 20 mg, Intravenous, Q3H PRN, Mansy, Jan A, MD .  levalbuterol Penne Lash) nebulizer solution 0.63 mg, 0.63 mg, Nebulization, Q6H PRN, Mansy, Jan A, MD, 0.63 mg at 04/05/20 0340 .  magnesium hydroxide (MILK OF MAGNESIA) suspension 30 mL, 30 mL, Oral, Daily PRN, Mansy, Jan A, MD .  methylPREDNISolone sodium succinate (SOLU-MEDROL) 40 mg/mL injection 40 mg, 40 mg, Intravenous, Q6H, Wyvonnia Dusky, MD, 40 mg at 04/05/20 0610 .  ondansetron (ZOFRAN) tablet 4 mg, 4 mg, Oral, Q6H PRN **OR** ondansetron (ZOFRAN) injection 4 mg, 4 mg, Intravenous, Q6H PRN, Mansy, Jan A, MD .  traZODone (DESYREL) tablet 25 mg, 25 mg, Oral, QHS PRN, Mansy, Arvella Merles, MD  Facility-Administered Medications Ordered in Other Encounters:  .  albuterol (PROVENTIL) (2.5 MG/3ML) 0.083% nebulizer solution 2.5 mg, 2.5 mg, Nebulization, Once, Laverle Hobby, MD    ALLERGIES   Bee venom, Aspirin, Codeine, Morphine and related, and Cat hair extract     REVIEW OF SYSTEMS    Review of Systems:  Gen:  Denies  fever, sweats, chills weigh loss  HEENT: Denies blurred vision, double vision, ear pain, eye pain, hearing loss, nose bleeds, sore throat Cardiac:  No dizziness, chest pain or heaviness, chest tightness,edema Resp: reports cough or sputum porduction, shortness of breath,wheezing, hemoptysis,  Gi: Denies swallowing difficulty, stomach pain, nausea or vomiting, diarrhea, constipation, bowel incontinence Gu:  Denies bladder incontinence, burning urine Ext:   Denies Joint pain, stiffness or  swelling Skin: Denies  skin rash, easy bruising or bleeding or hives Endoc:  Denies polyuria, polydipsia , polyphagia or weight change Psych:   Denies depression, insomnia or hallucinations   Other:  All other systems negative   VS: BP (!) 156/77 (BP Location: Right Arm)   Pulse (!) 108   Temp 97.9 F (36.6 C) (Oral)   Resp 20   Ht 5\' 2"  (1.575 m)   Wt 78.8 kg   SpO2 93%   BMI 31.77 kg/m      PHYSICAL EXAM    GENERAL:NAD, no fevers, chills, no weakness no fatigue HEAD: Normocephalic, atraumatic.  EYES: Pupils equal, round, reactive to light. Extraocular muscles intact. No scleral icterus.  MOUTH: Moist mucosal membrane. Dentition intact. No abscess noted.  EAR, NOSE, THROAT: Clear without exudates. No external lesions.  NECK: Supple. No thyromegaly. No nodules. No JVD.  PULMONARY: Diffuse coarse rhonchi right sided +wheezes CARDIOVASCULAR: S1 and S2. Regular rate and rhythm. No murmurs, rubs, or gallops. No edema. Pedal pulses 2+ bilaterally.  GASTROINTESTINAL: Soft, nontender, nondistended. No masses. Positive bowel sounds. No hepatosplenomegaly.  MUSCULOSKELETAL: No swelling, clubbing, or edema. Range of motion full in all extremities.  NEUROLOGIC: Cranial nerves II through XII are intact. No gross focal neurological deficits. Sensation intact. Reflexes intact.  SKIN: No ulceration, lesions, rashes, or cyanosis. Skin warm and dry. Turgor intact.  PSYCHIATRIC: Mood, affect within normal limits. The patient is awake, alert and oriented x 3. Insight, judgment intact.       IMAGING    DG Chest 1 View  Result Date: 04/03/2020 CLINICAL DATA:  Shortness of breath EXAM: CHEST  1 VIEW COMPARISON:  01/28/2020 FINDINGS: Right-sided chest port in stable positioning. Heart size is normal. Atherosclerotic calcification of the aortic knob. Interval development of patchy and interstitial opacities within the mid to lower aspect of the right lung. Left lung is hyperexpanded and clear.  Scattered granulomas are  unchanged. No pleural effusion or pneumothorax identified. IMPRESSION: Interval development of patchy and interstitial opacities within the mid to lower aspect of the right lung. Findings suspicious for pneumonia. Radiographic follow-up to resolution is recommended. Electronically Signed   By: Davina Poke D.O.   On: 04/03/2020 16:24         ASSESSMENT/PLAN   Acute on chronic hypoxemic respiratory failure  - due to right lung community acquired pneumonia  -possible aspiration related vs bacterial/viral etiology  -respiratory culture via expectorated sputum peding -Repeat blood culturues - 1 + staph capitis likely contaminant -hx of squamous stage 3 rectal CA- s/p chemoradiotherapy -RVP -MRSA nasal  -Procalcitonin trend -Legionella and strep pneumo negative -CXR reviewed as above with RLL infiltrate -Swallow evaluation to r/o recurrent aspiration due to RLL inifiltrate - narrow Antimicrobial regimen post above workup   Community acquired pneumonia  - present on admission   - etiology not clear at this time - possible viral/bacterial vs aspiration related as above  - Vanc/Cefepime >>Zithromax/Anceph  -Solumedrol   - s/p IVF  -repeat blood cx -noted legionella/strep pneumo -RVP - repeat CXR in am  Reviewed ABG with hypoxemia without hypercapnia   Severe acute exacerbation of COPD  - agree with current COPD carepath  - continue to wean steroids  - IS - I have encouraged patient to keep working with spirometer - currently tV at 850cc per breath  - c/w antibiotics  - DuoNEb therapy per RT   Chronic hypoxemia   - PT/OT and consider palliative care    - gentle diuresis   - metaneb for recruitment maneuvers       Thank you for allowing me to participate in the care of this patient.   Patient/Family are satisfied with care plan and all questions have been answered.   This document was prepared using Dragon voice recognition software and  may include unintentional dictation errors.     Ottie Glazier, M.D.  Division of [place]

## 2020-04-06 DIAGNOSIS — E876 Hypokalemia: Secondary | ICD-10-CM

## 2020-04-06 LAB — EXPECTORATED SPUTUM ASSESSMENT W GRAM STAIN, RFLX TO RESP C

## 2020-04-06 LAB — CBC
HCT: 42.7 % (ref 36.0–46.0)
Hemoglobin: 14.8 g/dL (ref 12.0–15.0)
MCH: 31.2 pg (ref 26.0–34.0)
MCHC: 34.7 g/dL (ref 30.0–36.0)
MCV: 89.9 fL (ref 80.0–100.0)
Platelets: 226 10*3/uL (ref 150–400)
RBC: 4.75 MIL/uL (ref 3.87–5.11)
RDW: 14.7 % (ref 11.5–15.5)
WBC: 8.6 10*3/uL (ref 4.0–10.5)
nRBC: 0 % (ref 0.0–0.2)

## 2020-04-06 LAB — URINE CULTURE: Culture: NO GROWTH

## 2020-04-06 LAB — BASIC METABOLIC PANEL
Anion gap: 13 (ref 5–15)
BUN: 21 mg/dL (ref 8–23)
CO2: 27 mmol/L (ref 22–32)
Calcium: 10.1 mg/dL (ref 8.9–10.3)
Chloride: 100 mmol/L (ref 98–111)
Creatinine, Ser: 0.75 mg/dL (ref 0.44–1.00)
GFR calc Af Amer: >60 mL/min (ref >60)
GFR calc non Af Amer: >60 mL/min (ref >60)
Glucose, Bld: 134 mg/dL — ABNORMAL HIGH (ref 70–99)
Potassium: 3.4 mmol/L — ABNORMAL LOW (ref 3.5–5.1)
Sodium: 140 mmol/L (ref 135–145)

## 2020-04-06 LAB — CEA: CEA: 2.4 ng/mL (ref 0.0–4.7)

## 2020-04-06 LAB — PROCALCITONIN: Procalcitonin: <0.1 ng/mL

## 2020-04-06 LAB — SAR COV2 SEROLOGY (COVID19)AB(IGG),IA: SARS-CoV-2 Ab, IgG: NONREACTIVE

## 2020-04-06 MED ORDER — METHYLPREDNISOLONE SODIUM SUCC 40 MG IJ SOLR
40.0000 mg | Freq: Two times a day (BID) | INTRAMUSCULAR | Status: DC
  Administered 2020-04-06 – 2020-04-09 (×7): 40 mg via INTRAVENOUS
  Filled 2020-04-06 (×6): qty 1

## 2020-04-06 MED ORDER — POTASSIUM CHLORIDE CRYS ER 20 MEQ PO TBCR
40.0000 meq | EXTENDED_RELEASE_TABLET | Freq: Once | ORAL | Status: AC
  Administered 2020-04-06: 40 meq via ORAL
  Filled 2020-04-06: qty 2

## 2020-04-06 NOTE — Progress Notes (Signed)
Pulmonary Medicine          Date: 04/06/2020,   MRN# [id number] Carol Gay March 23, 1946     AdmissionWeight: 77.3 kg                 CurrentWeight: 78.8 kg   Referring physician: Dr Jimmye Norman    CHIEF COMPLAINT:   Acute on chronic hypoxemic respiratory failure   SUBJECTIVE   Patient sitting up in bed, on HFNC 35L/min 45%.   Repeat interval CXR with improved interstitial edema and persistent RLL pna.   RVP is negative, MRSA nasal negative, repeat blood cultures negative, strep pneumo/legionella negative, patient afebrile overinight.   She is workinng with RT to do chest physiotherapy and able to cough up thickened phlegm with MetaNEB, we have ordered re-culturing of sputum.    PAST MEDICAL HISTORY   Past Medical History:  Diagnosis Date  . Aortic atherosclerosis ([place])   . Arm pain   . Asthma   . Benign neoplasm of rectosigmoid junction   . Benign neoplasm of transverse colon   . CAD in native artery   . Cancer ([place]) 09/2015   rectum  . Cigarette smoker   . Claustrophobia   . Colon cancer ([place])   . 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 COPD (chronic obstructive pulmonary disease) ([place])   . DDD (degenerative disc disease), lumbar       . Dermatophytoses   . Dyslipidemia   . H/O blood clots    left leg, veins stripped  . Hemorrhoids   . Hypertension   . Last menstrual period (LMP) > 10 days ago 1994  . Myocardial infarction [facility])    "mild" - age 64  . Personal history of chemotherapy   . Personal history of radiation therapy   . Pulmonary emphysema ([place])   . Rectal bleeding   . Shortness of breath dyspnea   . Smokers' cough ([place])   . Spinal headache    with one of my children's birth  . Vertigo   . Wears dentures    has full upper and lower - doesn't wear     SURGICAL HISTORY   Past Surgical History:  Procedure Laterality Date  . APPENDECTOMY    . BREAST BIOPSY Left 1970's   neg  . COLONOSCOPY WITH  PROPOFOL N/A 11/06/2015   Procedure: COLONOSCOPY WITH PROPOFOL;  Surgeon: Lucilla Lame, MD;  Location: [place];  Service: Endoscopy;  Laterality: N/A;  LEAVE PT EARLY  . EXPLORATORY LAPAROTOMY    . LESION EXCISION  11/03/15   Anal - Dr Dahlia Byes, [facility]  . LESION REMOVAL  1970's   from tailbone  . [place] SURGERY  1997  . OVARIAN CYST REMOVAL Left   . POLYPECTOMY  11/06/2015   Procedure: POLYPECTOMY;  Surgeon: Lucilla Lame, MD;  Location: [place];  Service: Endoscopy;;  . PORTACATH PLACEMENT N/A 12/10/2015   Procedure: INSERTION PORT-A-CATH;  Surgeon: Jules Husbands, MD;  Location: [facility];  Service: General;  Laterality: N/A;  . RECTAL EXAM UNDER ANESTHESIA N/A 06/23/2016   Procedure: RECTAL EXAM UNDER ANESTHESIA;  Surgeon: Jules Husbands, MD;  Location: [facility];  Service: General;  Laterality: N/A;  . VEIN LIGATION AND STRIPPING       FAMILY HISTORY   Family History  Problem Relation Age of Onset  . Cancer Mother        lymphoma  . Heart disease  Mother   . Cancer Maternal Grandmother        breast  . Birth defects Maternal Grandmother      SOCIAL HISTORY   Social History   Tobacco Use  . Smoking status: Former Smoker    Packs/day: 1.00    Years: 57.00    Pack years: 57.00    Types: Cigarettes    Quit date: 03/30/2016    Years since quitting: 4.0  . Smokeless tobacco: Never Used  . Tobacco comment: Quit in July 2017  Vaping Use  . Vaping Use: Never used  Substance Use Topics  . Alcohol use: No    Alcohol/week: 0.0 standard drinks  . Drug use: No     MEDICATIONS    Home Medication:    Current Medication:  Current Facility-Administered Medications:  .  acetaminophen (TYLENOL) tablet 650 mg, 650 mg, Oral, Q6H PRN, 650 mg at 04/06/20 1007 **OR** acetaminophen (TYLENOL) suppository 650 mg, 650 mg, Rectal, Q6H PRN, Mansy, Jan A, MD .  amLODipine (NORVASC) tablet 5 mg, 5 mg, Oral, Daily, Mansy, Jan A, MD, 5 mg at 04/06/20 1008 .   atorvastatin (LIPITOR) tablet 20 mg, 20 mg, Oral, QHS, Mansy, Jan A, MD, 20 mg at 04/05/20 2028 .  azithromycin (ZITHROMAX) 500 mg in sodium chloride 0.9 % 250 mL IVPB, 500 mg, Intravenous, Q24H, Mansy, Arvella Merles, MD, Stopped at 04/05/20 2310 .  calcium carbonate (TUMS - dosed in mg elemental calcium) chewable tablet 200 mg of elemental calcium, 1 tablet, Oral, TID PRN, Mansy, Jan A, MD, 200 mg of elemental calcium at 04/04/20 1848 .  ceFAZolin (ANCEF) IVPB 2g/100 mL premix, 2 g, Intravenous, Q8H, Wyvonnia Dusky, MD, Last Rate: 200 mL/hr at 04/06/20 1315, 2 g at 04/06/20 1315 .  Chlorhexidine Gluconate Cloth 2 % PADS 6 each, 6 each, Topical, Daily, Wyvonnia Dusky, MD, 6 each at 04/06/20 1008 .  cyclobenzaprine (FLEXERIL) tablet 10 mg, 10 mg, Oral, BID, Mansy, Jan A, MD, 10 mg at 04/06/20 1008 .  enoxaparin (LOVENOX) injection 40 mg, 40 mg, Subcutaneous, Q24H, Mansy, Jan A, MD, 40 mg at 04/05/20 2028 .  furosemide (LASIX) injection 20 mg, 20 mg, Intravenous, Daily, Lanney Gins, Lindberg Zenon, MD, 20 mg at 04/06/20 1007 .  guaiFENesin (MUCINEX) 12 hr tablet 600 mg, 600 mg, Oral, BID, Mansy, Jan A, MD, 600 mg at 04/06/20 1008 .  guaiFENesin-dextromethorphan (ROBITUSSIN DM) 100-10 MG/5ML syrup 5 mL, 5 mL, Oral, Q4H PRN, Mansy, Jan A, MD .  hydrOXYzine (ATARAX/VISTARIL) tablet 25 mg, 25 mg, Oral, BID PRN, Mansy, Jan A, MD .  ipratropium-albuterol (DUONEB) 0.5-2.5 (3) MG/3ML nebulizer solution 3 mL, 3 mL, Nebulization, Q6H, Wyvonnia Dusky, MD, 3 mL at 04/06/20 1425 .  labetalol (NORMODYNE) injection 20 mg, 20 mg, Intravenous, Q3H PRN, Mansy, Jan A, MD .  levalbuterol Penne Lash) nebulizer solution 0.63 mg, 0.63 mg, Nebulization, Q6H PRN, Mansy, Jan A, MD, 0.63 mg at 04/05/20 1411 .  magnesium hydroxide (MILK OF MAGNESIA) suspension 30 mL, 30 mL, Oral, Daily PRN, Mansy, Jan A, MD .  methylPREDNISolone sodium succinate (SOLU-MEDROL) 40 mg/mL injection 40 mg, 40 mg, Intravenous, Q6H, Wyvonnia Dusky, MD, 40  mg at 04/06/20 1311 .  ondansetron (ZOFRAN) tablet 4 mg, 4 mg, Oral, Q6H PRN **OR** ondansetron (ZOFRAN) injection 4 mg, 4 mg, Intravenous, Q6H PRN, Mansy, Jan A, MD .  traZODone (DESYREL) tablet 25 mg, 25 mg, Oral, QHS PRN, Mansy, Arvella Merles, MD  Facility-Administered Medications Ordered in Other Encounters:  .  albuterol (PROVENTIL) (2.5 MG/3ML) 0.083% nebulizer solution 2.5 mg, 2.5 mg, Nebulization, Once, Laverle Hobby, MD    ALLERGIES   Bee venom, Aspirin, Codeine, Morphine and related, and Cat hair extract     REVIEW OF SYSTEMS    Review of Systems:  Gen:  Denies  fever, sweats, chills weigh loss  HEENT: Denies blurred vision, double vision, ear pain, eye pain, hearing loss, nose bleeds, sore throat Cardiac:  No dizziness, chest pain or heaviness, chest tightness,edema Resp: reports cough or sputum porduction, shortness of breath,wheezing, hemoptysis,  Gi: Denies swallowing difficulty, stomach pain, nausea or vomiting, diarrhea, constipation, bowel incontinence Gu:  Denies bladder incontinence, burning urine Ext:   Denies Joint pain, stiffness or swelling Skin: Denies  skin rash, easy bruising or bleeding or hives Endoc:  Denies polyuria, polydipsia , polyphagia or weight change Psych:   Denies depression, insomnia or hallucinations   Other:  All other systems negative   VS: BP 129/69 (BP Location: Right Arm)   Pulse 90   Temp 98.5 F (36.9 C)   Resp 19   Ht 5\' 2"  (1.575 m)   Wt 78.8 kg   SpO2 93%   BMI 31.77 kg/m      PHYSICAL EXAM    GENERAL:NAD, no fevers, chills, no weakness no fatigue HEAD: Normocephalic, atraumatic.  EYES: Pupils equal, round, reactive to light. Extraocular muscles intact. No scleral icterus.  MOUTH: Moist mucosal membrane. Dentition intact. No abscess noted.  EAR, NOSE, THROAT: Clear without exudates. No external lesions.  NECK: Supple. No thyromegaly. No nodules. No JVD.  PULMONARY: Diffuse coarse rhonchi right sided  +wheezes CARDIOVASCULAR: S1 and S2. Regular rate and rhythm. No murmurs, rubs, or gallops. No edema. Pedal pulses 2+ bilaterally.  GASTROINTESTINAL: Soft, nontender, nondistended. No masses. Positive bowel sounds. No hepatosplenomegaly.  MUSCULOSKELETAL: No swelling, clubbing, or edema. Range of motion full in all extremities.  NEUROLOGIC: Cranial nerves II through XII are intact. No gross focal neurological deficits. Sensation intact. Reflexes intact.  SKIN: No ulceration, lesions, rashes, or cyanosis. Skin warm and dry. Turgor intact.  PSYCHIATRIC: Mood, affect within normal limits. The patient is awake, alert and oriented x 3. Insight, judgment intact.       IMAGING    DG Chest 1 View  Result Date: 04/03/2020 CLINICAL DATA:  Shortness of breath EXAM: CHEST  1 VIEW COMPARISON:  01/28/2020 FINDINGS: Right-sided chest port in stable positioning. Heart size is normal. Atherosclerotic calcification of the aortic knob. Interval development of patchy and interstitial opacities within the mid to lower aspect of the right lung. Left lung is hyperexpanded and clear. Scattered granulomas are unchanged. No pleural effusion or pneumothorax identified. IMPRESSION: Interval development of patchy and interstitial opacities within the mid to lower aspect of the right lung. Findings suspicious for pneumonia. Radiographic follow-up to resolution is recommended. Electronically Signed   By: Davina Poke D.O.   On: 04/03/2020 16:24   DG Chest Port 1 View  Result Date: 04/05/2020 CLINICAL DATA:  74 year old female with a history of shortness of breath EXAM: PORTABLE CHEST 1 VIEW COMPARISON:  04/03/2020 FINDINGS: Cardiomediastinal silhouette unchanged. Reticulonodular opacities of the right greater than left lung, unchanged from the comparison plain film. No pneumothorax. No pleural effusion. Unchanged right IJ port catheter. IMPRESSION: Similar appearance of the chest x-ray with right greater than left  reticulonodular opacities, potentially multifocal infection. Unchanged right IJ port catheter. Electronically Signed   By: Corrie Mckusick D.O.   On: 04/05/2020 15:27   ECHOCARDIOGRAM COMPLETE  Result Date: 04/05/2020    ECHOCARDIOGRAM REPORT   Patient Name:   Carol Gay Date of Exam: 04/05/2020 Medical Rec #:  [id number]        Height:       62.0 in Accession #:    [id number]       Weight:       173.7 lb Date of Birth:  1946-01-13        BSA:          1.801 m Patient Age:    47 years         BP:           143/68 mmHg Patient Gender: F                HR:           100 bpm. Exam Location:  [facility] Procedure: 2D Echo Indications:     Bacteremia  History:         Patient has no prior history of Echocardiogram examinations.                  Advanced Stage COPD; Risk Factors:Current Smoker and                  Hypertension.  Sonographer:     L Thornton-Maynard Referring Phys:  [id number] Wyvonnia Dusky Diagnosing Phys: Ida Rogue MD  Sonographer Comments: Image acquisition challenging due to COPD. IMPRESSIONS  1. Left ventricular ejection fraction, by estimation, is 55 to 60%. The left ventricle has normal function. The left ventricle has no regional wall motion abnormalities. There is mild left ventricular hypertrophy. Left ventricular diastolic parameters are consistent with Grade I diastolic dysfunction (impaired relaxation).  2. Right ventricular systolic function is mildly reduced. The right ventricular size is moderately enlarged. There is severely elevated pulmonary artery systolic pressure. The estimated right ventricular systolic pressure is 42.6 mmHg.  3. No valve vegetation noted. FINDINGS  Left Ventricle: Left ventricular ejection fraction, by estimation, is 55 to 60%. The left ventricle has normal function. The left ventricle has no regional wall motion abnormalities. The left ventricular internal cavity size was normal in size. There is  mild left ventricular hypertrophy. Left ventricular diastolic  parameters are consistent with Grade I diastolic dysfunction (impaired relaxation). Right Ventricle: The right ventricular size is moderately enlarged. No increase in right ventricular wall thickness. Right ventricular systolic function is mildly reduced. There is severely elevated pulmonary artery systolic pressure. The tricuspid regurgitant velocity is 4.48 m/s, and with an assumed right atrial pressure of 10 mmHg, the estimated right ventricular systolic pressure is 83.4 mmHg. Left Atrium: Left atrial size was normal in size. Right Atrium: Right atrial size was normal in size. Pericardium: There is no evidence of pericardial effusion. Mitral Valve: The mitral valve is normal in structure. Normal mobility of the mitral valve leaflets. Mild mitral valve regurgitation. No evidence of mitral valve stenosis. MV peak gradient, 5.4 mmHg. The mean mitral valve gradient is 3.0 mmHg. Tricuspid Valve: The tricuspid valve is normal in structure. Tricuspid valve regurgitation is mild . No evidence of tricuspid stenosis. Aortic Valve: The aortic valve was not well visualized. Aortic valve regurgitation is not visualized. No aortic stenosis is present. Aortic valve mean gradient measures 5.0 mmHg. Aortic valve peak gradient measures 7.0 mmHg. Aortic valve area, by VTI measures 1.31 cm. Pulmonic Valve: The pulmonic valve was normal in structure. Pulmonic valve regurgitation is not visualized. No evidence of pulmonic stenosis. Aorta: The  aortic root is normal in size and structure. Venous: The inferior vena cava is normal in size with less than 50% respiratory variability, suggesting right atrial pressure of 8 mmHg. IAS/Shunts: No atrial level shunt detected by color flow Doppler.  LEFT VENTRICLE PLAX 2D LVIDd:         3.98 cm  Diastology LVIDs:         2.65 cm  LV e' lateral:   6.31 cm/s LV PW:         1.61 cm  LV E/e' lateral: 12.4 LV IVS:        1.55 cm  LV e' medial:    5.33 cm/s LVOT diam:     1.70 cm  LV E/e' medial:  14.7  LV SV:         38 LV SV Index:   21 LVOT Area:     2.27 cm  RIGHT VENTRICLE RV S prime:     14.60 cm/s TAPSE (M-mode): 1.8 cm LEFT ATRIUM         Index LA diam:    3.50 cm 1.94 cm/m  AORTIC VALVE AV Area (Vmax):    1.47 cm AV Area (Vmean):   1.25 cm AV Area (VTI):     1.31 cm AV Vmax:           132.00 cm/s AV Vmean:          104.000 cm/s AV VTI:            0.292 m AV Peak Grad:      7.0 mmHg AV Mean Grad:      5.0 mmHg LVOT Vmax:         85.50 cm/s LVOT Vmean:        57.500 cm/s LVOT VTI:          0.168 m LVOT/AV VTI ratio: 0.58 MITRAL VALVE                TRICUSPID VALVE MV Area (PHT): 5.00 cm     TR Peak grad:   80.3 mmHg MV Peak grad:  5.4 mmHg     TR Vmax:        448.00 cm/s MV Mean grad:  3.0 mmHg MV Vmax:       1.16 m/s     SHUNTS MV Vmean:      83.0 cm/s    Systemic VTI:  0.17 m MV E velocity: 78.40 cm/s   Systemic Diam: 1.70 cm MV A velocity: 117.00 cm/s MV E/A ratio:  0.67 Ida Rogue MD Electronically signed by Ida Rogue MD Signature Date/Time: 04/05/2020/11:07:53 PM    Final             ASSESSMENT/PLAN   Acute on chronic hypoxemic respiratory failure  - due to right lung community acquired pneumonia  -possible aspiration related vs bacterial/viral etiology  -respiratory culture via expectorated sputum peding -Repeat blood culturues - 1 + staph capitis likely contaminant -hx of squamous stage 3 rectal CA- s/p chemoradiotherapy -RVP-negative -MRSA nasal -negative -Procalcitonin trend -Legionella and strep pneumo negative-Bnegative  -CXR reviewed as above with RLL infiltrate -Swallow evaluation to r/o recurrent aspiration due to RLL inifiltrate - narrow Antimicrobial regimen post above workup   Community acquired pneumonia  - present on admission   - etiology not clear at this time - possible viral/bacterial vs aspiration related as above  - Vanc/Cefepime >>Zithromax/Anceph  -Solumedrol   - s/p IVF  -repeat blood cx -noted legionella/strep pneumo -RVP -  repeat CXR  in am  Reviewed ABG with hypoxemia without hypercapnia   Severe acute exacerbation of COPD  - agree with current COPD carepath  - continue to wean steroids  - IS - I have encouraged patient to keep working with spirometer - currently tV at 850cc per breath  - c/w antibiotics  - DuoNEb therapy per RT   Chronic hypoxemia   - PT/OT and consider palliative care    - gentle diuresis   - metaneb for recruitment maneuvers       Thank you for allowing me to participate in the care of this patient.   Patient/Family are satisfied with care plan and all questions have been answered.   This document was prepared using Dragon voice recognition software and may include unintentional dictation errors.     Ottie Glazier, M.D.  Division of [place]

## 2020-04-06 NOTE — Progress Notes (Signed)
PROGRESS NOTE    Carol Gay  WNI:[id number] DOB: 21-Jun-1946 DOA: 04/03/2020 PCP: Glean Hess, MD    Assessment & Plan:   Active Problems:   Pneumonia   RLL pneumonia: continue on IV ancef & azithromycin. Continue on bronchodilators. Encourage incentive spirometry & flutter valve. Continue on supplemental oxygen and wean as tolerated, currently on HFNC. Pulmon consulted   COPD exacerbation: severe but improving. Continue on IV solumedrol, bronchodilators & IV azithromycin. Continue on supplemental oxygen and wean as tolerated, currently on HFNC. Previous smoker but quit 3-4 years ago. Pulmon recs apprec    Acute on chronic hypoxic respiratory failure: secondary to above. Improved today.  At home pt is on 6L [place] and currently 15 HFNC. Pulmon following and recs apprec   Unlikely bacteremia: growing 1/4 gram positive cocci, stap capitis, likely containment. Repeat blood cxs NGTD. Echo shows ER 38-18%, grade I diastolic dysfunction & pulmonary HTN. Continue on IV ancef, azithromycin. Urine cx pending  Leukocytosis: resolved  HTN: continue on amlodipine. IV labetalol prn  Hypokalemia: KCl repleted. Will continue to monitor     DVT prophylaxis: lovenox  Code Status: full  Family Communication: Disposition Plan: depends on PT/OT recs  Status is: Inpatient  Remains inpatient appropriate because:IV treatments appropriate due to intensity of illness or inability to take PO   Dispo: The patient is from: Home              Anticipated d/c is to: Home vs SNF               Anticipated d/c date is: 2 days               Patient currently is not medically stable to d/c.    Consultants:   pulmon   Procedures:    Antimicrobials: ancef, azithromycin   Subjective: Pt c/o shortness of breath improved from day prior   Objective: Vitals:   04/05/20 1958 04/06/20 0305 04/06/20 0637 04/06/20 0718  BP:   (!) 158/85 (!) 160/81  Pulse:   94 95  Resp:   15 17  Temp:    97.6 F (36.4 C) 98.1 F (36.7 C)  TempSrc:   Oral   SpO2: 95% 93% 95% 97%  Weight:   78.8 kg   Height:        Intake/Output Summary (Last 24 hours) at 04/06/2020 0753 Last data filed at 04/06/2020 0200 Gross per 24 hour  Intake 4154.67 ml  Output 2600 ml  Net 1554.67 ml   Filed Weights   04/03/20 2203 04/05/20 0346 04/06/20 0637  Weight: 78.1 kg 78.8 kg 78.8 kg    Examination:  General exam: Appears calm but uncomfortable  Respiratory system: course breath sounds b/l Cardiovascular system: S1 & S2+. No rubs, gallops or clicks.  Gastrointestinal system: Abdomen is obese, soft and nontender.  Hypoactive bowel sounds heard. Central nervous system: Alert and oriented. Moves all 4 extremities  Psychiatry: Judgement and insight appear normal. Flat mood and affect      Data Reviewed: I have personally reviewed following labs and imaging studies  CBC: Recent Labs  Lab 04/03/20 1543 04/04/20 0538 04/05/20 0402 04/06/20 0616  WBC 9.4 6.3 12.5* 8.6  NEUTROABS 6.2  --   --   --   HGB 15.7* 12.5 13.8 14.8  HCT 45.8 37.2 40.4 42.7  MCV 88.6 89.4 89.0 89.9  PLT 312 246 267 299   Basic Metabolic Panel: Recent Labs  Lab 04/03/20 1543 04/04/20 0538 04/05/20 0402  04/06/20 0616  NA 139 142 141 140  K 4.0 4.5 3.5 3.4*  CL 103 112* 107 100  CO2 26 22 23 27   GLUCOSE 146* 149* 142* 134*  BUN 9 8 12 21   CREATININE 1.05* 0.87 0.76 0.75  CALCIUM 10.5* 9.3 10.0 10.1   GFR: Estimated Creatinine Clearance: 60 mL/min (by C-G formula based on SCr of 0.75 mg/dL). Liver Function Tests: Recent Labs  Lab 04/03/20 1543  AST 26  ALT 14  ALKPHOS 84  BILITOT 1.0  PROT 9.1*  ALBUMIN 4.4   No results for input(s): LIPASE, AMYLASE in the last 168 hours. No results for input(s): AMMONIA in the last 168 hours. Coagulation Profile: No results for input(s): INR, PROTIME in the last 168 hours. Cardiac Enzymes: No results for input(s): CKTOTAL, CKMB, CKMBINDEX, TROPONINI in the last  168 hours. BNP (last 3 results) No results for input(s): PROBNP in the last 8760 hours. HbA1C: No results for input(s): HGBA1C in the last 72 hours. CBG: Recent Labs  Lab 04/05/20 1142  GLUCAP 115*   Lipid Profile: No results for input(s): CHOL, HDL, LDLCALC, TRIG, CHOLHDL, LDLDIRECT in the last 72 hours. Thyroid Function Tests: No results for input(s): TSH, T4TOTAL, FREET4, T3FREE, THYROIDAB in the last 72 hours. Anemia Panel: No results for input(s): VITAMINB12, FOLATE, FERRITIN, TIBC, IRON, RETICCTPCT in the last 72 hours. Sepsis Labs: Recent Labs  Lab 04/03/20 1704 04/03/20 1813 04/05/20 1317 04/06/20 0616  PROCALCITON  --   --  <0.10 <0.10  LATICACIDVEN 3.3* 3.9*  --   --     Recent Results (from the past 240 hour(s))  SARS Coronavirus 2 by RT PCR (hospital order, performed in [facility] hospital lab) Nasopharyngeal Nasopharyngeal Swab     Status: None   Collection Time: 04/03/20  5:04 PM   Specimen: Nasopharyngeal Swab  Result Value Ref Range Status   SARS Coronavirus 2 NEGATIVE NEGATIVE Final    Comment: (NOTE) SARS-CoV-2 target nucleic acids are NOT DETECTED.  The SARS-CoV-2 RNA is generally detectable in upper and lower respiratory specimens during the acute phase of infection. The lowest concentration of SARS-CoV-2 viral copies this assay can detect is 250 copies / mL. A negative result does not preclude SARS-CoV-2 infection and should not be used as the sole basis for treatment or other patient management decisions.  A negative result may occur with improper specimen collection / handling, submission of specimen other than nasopharyngeal swab, presence of viral mutation(s) within the areas targeted by this assay, and inadequate number of viral copies (<250 copies / mL). A negative result must be combined with clinical observations, patient history, and epidemiological information.  Fact Sheet for Patients:    StrictlyIdeas.no  Fact Sheet for Healthcare Providers: BankingDealers.co.za  This test is not yet approved or  cleared by the [place] FDA and has been authorized for detection and/or diagnosis of SARS-CoV-2 by FDA under an Emergency Use Authorization (EUA).  This EUA will remain in effect (meaning this test can be used) for the duration of the COVID-19 declaration under Section 564(b)(1) of the Act, 21 U.S.C. section 360bbb-3(b)(1), unless the authorization is terminated or revoked sooner.  Performed at [facility], [address] [place], [place] [address]   Culture, blood (routine x 2)     Status: None (Preliminary result)   Collection Time: 04/03/20  5:23 PM   Specimen: BLOOD  Result Value Ref Range Status   Specimen Description BLOOD  Final   Special Requests NONE  Final   Culture   Final    NO GROWTH 2 DAYS Performed at [facility], [address] [place], [place] [address]    Report Status PENDING  Incomplete  Culture, blood (routine x 2)     Status: Abnormal   Collection Time: 04/03/20  5:24 PM   Specimen: BLOOD LEFT HAND  Result Value Ref Range Status   Specimen Description   Final    BLOOD LEFT HAND Performed at [facility], [place] [address] [place], [place] [address]    Special Requests   Final    NONE Performed at [facility], [address] [place], [place] [address]    Culture  Setup Time   Final    GRAM POSITIVE COCCI AEROBIC BOTTLE ONLY CRITICAL RESULT CALLED TO, READ BACK BY AND VERIFIED WITH: ABBY ELLINGTON 04/04/20 AT 1750 BY AR    Culture (A)  Final    STAPHYLOCOCCUS CAPITIS THE SIGNIFICANCE OF ISOLATING THIS ORGANISM FROM A SINGLE SET OF BLOOD CULTURES WHEN MULTIPLE SETS ARE DRAWN IS UNCERTAIN. PLEASE NOTIFY THE MICROBIOLOGY DEPARTMENT WITHIN ONE WEEK IF SPECIATION AND SENSITIVITIES ARE REQUIRED. Performed at [facility], [place] [address]  [place], [place] [address]    Report Status 04/05/2020 FINAL  Final  Blood Culture ID Panel (Reflexed)     Status: Abnormal   Collection Time: 04/03/20  5:24 PM  Result Value Ref Range Status   Enterococcus species NOT DETECTED NOT DETECTED Final   Listeria monocytogenes NOT DETECTED NOT DETECTED Final   Staphylococcus species DETECTED (A) NOT DETECTED Final    Comment: Methicillin (oxacillin) susceptible coagulase negative staphylococcus. Possible blood culture contaminant (unless isolated from more than one blood culture draw or clinical case suggests pathogenicity). No antibiotic treatment is indicated for blood  culture contaminants. CRITICAL RESULT CALLED TO, READ BACK BY AND VERIFIED WITH: ABBY ELLINGTON 04/04/20 @1730  BY ACR    Staphylococcus aureus (BCID) NOT DETECTED NOT DETECTED Final   Methicillin resistance NOT DETECTED NOT DETECTED Final   Streptococcus species NOT DETECTED NOT DETECTED Final   Streptococcus agalactiae NOT DETECTED NOT DETECTED Final   Streptococcus pneumoniae NOT DETECTED NOT DETECTED Final   Streptococcus pyogenes NOT DETECTED NOT DETECTED Final   Acinetobacter baumannii NOT DETECTED NOT DETECTED Final   Enterobacteriaceae species NOT DETECTED NOT DETECTED Final   Enterobacter cloacae complex NOT DETECTED NOT DETECTED Final   Escherichia coli NOT DETECTED NOT DETECTED Final   Klebsiella oxytoca NOT DETECTED NOT DETECTED Final   Klebsiella pneumoniae NOT DETECTED NOT DETECTED Final   Proteus species NOT DETECTED NOT DETECTED Final   Serratia marcescens NOT DETECTED NOT DETECTED Final   Haemophilus influenzae NOT DETECTED NOT DETECTED Final   Neisseria meningitidis NOT DETECTED NOT DETECTED Final   Pseudomonas aeruginosa NOT DETECTED NOT DETECTED Final   Candida albicans NOT DETECTED NOT DETECTED Final   Candida glabrata NOT DETECTED NOT DETECTED Final   Candida krusei NOT DETECTED NOT DETECTED Final   Candida parapsilosis NOT DETECTED NOT DETECTED Final    Candida tropicalis NOT DETECTED NOT DETECTED Final    Comment: Performed at [facility], [address] [place], [place] [address]  MRSA PCR Screening     Status: None   Collection Time: 04/05/20  2:45 PM   Specimen: Nasopharyngeal  Result Value Ref Range Status   MRSA by PCR NEGATIVE NEGATIVE Final    Comment:        The GeneXpert MRSA Assay (FDA approved for  NASAL specimens only), is one component of a comprehensive MRSA colonization surveillance program. It is not intended to diagnose MRSA infection nor to guide or monitor treatment for MRSA infections. Performed at [facility], [address] [place], [place] [address]   Respiratory Panel by PCR     Status: None   Collection Time: 04/05/20  2:45 PM   Specimen: Nasopharyngeal Swab; Respiratory  Result Value Ref Range Status   Adenovirus NOT DETECTED NOT DETECTED Final   Coronavirus 229E NOT DETECTED NOT DETECTED Final    Comment: (NOTE) The Coronavirus on the Respiratory Panel, DOES NOT test for the novel  Coronavirus (2019 nCoV)    Coronavirus HKU1 NOT DETECTED NOT DETECTED Final   Coronavirus NL63 NOT DETECTED NOT DETECTED Final   Coronavirus OC43 NOT DETECTED NOT DETECTED Final   Metapneumovirus NOT DETECTED NOT DETECTED Final   Rhinovirus / Enterovirus NOT DETECTED NOT DETECTED Final   Influenza A NOT DETECTED NOT DETECTED Final   Influenza B NOT DETECTED NOT DETECTED Final   Parainfluenza Virus 1 NOT DETECTED NOT DETECTED Final   Parainfluenza Virus 2 NOT DETECTED NOT DETECTED Final   Parainfluenza Virus 3 NOT DETECTED NOT DETECTED Final   Parainfluenza Virus 4 NOT DETECTED NOT DETECTED Final   Respiratory Syncytial Virus NOT DETECTED NOT DETECTED Final   Bordetella pertussis NOT DETECTED NOT DETECTED Final   Chlamydophila pneumoniae NOT DETECTED NOT DETECTED Final   Mycoplasma pneumoniae NOT DETECTED NOT DETECTED Final    Comment: Performed at [facility], [place],  [place], [place] [address]         Radiology Studies: DG [place] 1 View  Result Date: 04/05/2020 CLINICAL DATA:  74 year old female with a history of shortness of breath EXAM: PORTABLE CHEST 1 VIEW COMPARISON:  04/03/2020 FINDINGS: Cardiomediastinal silhouette unchanged. Reticulonodular opacities of the right greater than left lung, unchanged from the comparison plain film. No pneumothorax. No pleural effusion. Unchanged right IJ port catheter. IMPRESSION: Similar appearance of the chest x-ray with right greater than left reticulonodular opacities, potentially multifocal infection. Unchanged right IJ port catheter. Electronically Signed   By: Corrie Mckusick D.O.   On: 04/05/2020 15:27   ECHOCARDIOGRAM COMPLETE  Result Date: 04/05/2020    ECHOCARDIOGRAM REPORT   Patient Name:   Carol Gay Date of Exam: 04/05/2020 Medical Rec #:  [id number]        Height:       62.0 in Accession #:    [id number]       Weight:       173.7 lb Date of Birth:  1945/12/21        BSA:          1.801 m Patient Age:    6 years         BP:           143/68 mmHg Patient Gender: F                HR:           100 bpm. Exam Location:  [facility] Procedure: 2D Echo Indications:     Bacteremia  History:         Patient has no prior history of Echocardiogram examinations.                  Advanced Stage COPD; Risk Factors:Current Smoker and                  Hypertension.  Sonographer:     L Thornton-Maynard Referring Phys:  [id number] Wyvonnia Dusky Diagnosing Phys: Ida Rogue MD  Sonographer Comments: Image acquisition challenging due to COPD. IMPRESSIONS  1. Left ventricular ejection fraction, by estimation, is 55 to 60%. The left ventricle has normal function. The left ventricle has no regional wall motion abnormalities. There is mild left ventricular hypertrophy. Left ventricular diastolic parameters are consistent with Grade I diastolic dysfunction (impaired relaxation).  2. Right ventricular systolic function is mildly reduced.  The right ventricular size is moderately enlarged. There is severely elevated pulmonary artery systolic pressure. The estimated right ventricular systolic pressure is 59.5 mmHg.  3. No valve vegetation noted. FINDINGS  Left Ventricle: Left ventricular ejection fraction, by estimation, is 55 to 60%. The left ventricle has normal function. The left ventricle has no regional wall motion abnormalities. The left ventricular internal cavity size was normal in size. There is  mild left ventricular hypertrophy. Left ventricular diastolic parameters are consistent with Grade I diastolic dysfunction (impaired relaxation). Right Ventricle: The right ventricular size is moderately enlarged. No increase in right ventricular wall thickness. Right ventricular systolic function is mildly reduced. There is severely elevated pulmonary artery systolic pressure. The tricuspid regurgitant velocity is 4.48 m/s, and with an assumed right atrial pressure of 10 mmHg, the estimated right ventricular systolic pressure is 63.8 mmHg. Left Atrium: Left atrial size was normal in size. Right Atrium: Right atrial size was normal in size. Pericardium: There is no evidence of pericardial effusion. Mitral Valve: The mitral valve is normal in structure. Normal mobility of the mitral valve leaflets. Mild mitral valve regurgitation. No evidence of mitral valve stenosis. MV peak gradient, 5.4 mmHg. The mean mitral valve gradient is 3.0 mmHg. Tricuspid Valve: The tricuspid valve is normal in structure. Tricuspid valve regurgitation is mild . No evidence of tricuspid stenosis. Aortic Valve: The aortic valve was not well visualized. Aortic valve regurgitation is not visualized. No aortic stenosis is present. Aortic valve mean gradient measures 5.0 mmHg. Aortic valve peak gradient measures 7.0 mmHg. Aortic valve area, by VTI measures 1.31 cm. Pulmonic Valve: The pulmonic valve was normal in structure. Pulmonic valve regurgitation is not visualized. No  evidence of pulmonic stenosis. Aorta: The aortic root is normal in size and structure. Venous: The inferior vena cava is normal in size with less than 50% respiratory variability, suggesting right atrial pressure of 8 mmHg. IAS/Shunts: No atrial level shunt detected by color flow Doppler.  LEFT VENTRICLE PLAX 2D LVIDd:         3.98 cm  Diastology LVIDs:         2.65 cm  LV e' lateral:   6.31 cm/s LV PW:         1.61 cm  LV E/e' lateral: 12.4 LV IVS:        1.55 cm  LV e' medial:    5.33 cm/s LVOT diam:     1.70 cm  LV E/e' medial:  14.7 LV SV:         38 LV SV Index:   21 LVOT Area:     2.27 cm  RIGHT VENTRICLE RV S prime:     14.60 cm/s TAPSE (M-mode): 1.8 cm LEFT ATRIUM         Index LA diam:    3.50 cm 1.94 cm/m  AORTIC VALVE AV Area (Vmax):    1.47 cm AV Area (Vmean):   1.25 cm AV Area (VTI):     1.31 cm AV Vmax:  132.00 cm/s AV Vmean:          104.000 cm/s AV VTI:            0.292 m AV Peak Grad:      7.0 mmHg AV Mean Grad:      5.0 mmHg LVOT Vmax:         85.50 cm/s LVOT Vmean:        57.500 cm/s LVOT VTI:          0.168 m LVOT/AV VTI ratio: 0.58 MITRAL VALVE                TRICUSPID VALVE MV Area (PHT): 5.00 cm     TR Peak grad:   80.3 mmHg MV Peak grad:  5.4 mmHg     TR Vmax:        448.00 cm/s MV Mean grad:  3.0 mmHg MV Vmax:       1.16 m/s     SHUNTS MV Vmean:      83.0 cm/s    Systemic VTI:  0.17 m MV E velocity: 78.40 cm/s   Systemic Diam: 1.70 cm MV A velocity: 117.00 cm/s MV E/A ratio:  0.67 Ida Rogue MD Electronically signed by Ida Rogue MD Signature Date/Time: 04/05/2020/11:07:53 PM    Final         Scheduled Meds: . amLODipine  5 mg Oral Daily  . atorvastatin  20 mg Oral QHS  . Chlorhexidine Gluconate Cloth  6 each Topical Daily  . cyclobenzaprine  10 mg Oral BID  . enoxaparin (LOVENOX) injection  40 mg Subcutaneous Q24H  . furosemide  20 mg Intravenous Daily  . guaiFENesin  600 mg Oral BID  . ipratropium-albuterol  3 mL Nebulization Q6H  . methylPREDNISolone  (SOLU-MEDROL) injection  40 mg Intravenous Q6H   Continuous Infusions: . azithromycin Stopped (04/05/20 2310)  .  ceFAZolin (ANCEF) IV 2 g (04/06/20 0762)     LOS: 3 days    Time spent: 30 mins     Wyvonnia Dusky, MD [facility] Pager [phone]  If 7PM-7AM, please contact night-coverage [email or web address] 04/06/2020, 7:53 AM

## 2020-04-07 ENCOUNTER — Encounter: Payer: Self-pay | Admitting: Family Medicine

## 2020-04-07 ENCOUNTER — Inpatient Hospital Stay: Payer: Medicare HMO

## 2020-04-07 DIAGNOSIS — J9622 Acute and chronic respiratory failure with hypercapnia: Secondary | ICD-10-CM

## 2020-04-07 LAB — BASIC METABOLIC PANEL
Anion gap: 7 (ref 5–15)
BUN: 32 mg/dL — ABNORMAL HIGH (ref 8–23)
CO2: 30 mmol/L (ref 22–32)
Calcium: 9.7 mg/dL (ref 8.9–10.3)
Chloride: 101 mmol/L (ref 98–111)
Creatinine, Ser: 0.78 mg/dL (ref 0.44–1.00)
GFR calc Af Amer: >60 mL/min (ref >60)
GFR calc non Af Amer: >60 mL/min (ref >60)
Glucose, Bld: 123 mg/dL — ABNORMAL HIGH (ref 70–99)
Potassium: 3.4 mmol/L — ABNORMAL LOW (ref 3.5–5.1)
Sodium: 138 mmol/L (ref 135–145)

## 2020-04-07 LAB — CBC
HCT: 41.5 % (ref 36.0–46.0)
Hemoglobin: 14 g/dL (ref 12.0–15.0)
MCH: 30.3 pg (ref 26.0–34.0)
MCHC: 33.7 g/dL (ref 30.0–36.0)
MCV: 89.8 fL (ref 80.0–100.0)
Platelets: 232 10*3/uL (ref 150–400)
RBC: 4.62 MIL/uL (ref 3.87–5.11)
RDW: 14.6 % (ref 11.5–15.5)
WBC: 7 10*3/uL (ref 4.0–10.5)
nRBC: 0 % (ref 0.0–0.2)

## 2020-04-07 LAB — PROCALCITONIN: Procalcitonin: <0.1 ng/mL

## 2020-04-07 MED ORDER — POTASSIUM CHLORIDE CRYS ER 20 MEQ PO TBCR
40.0000 meq | EXTENDED_RELEASE_TABLET | Freq: Once | ORAL | Status: AC
  Administered 2020-04-07: 40 meq via ORAL
  Filled 2020-04-07: qty 2

## 2020-04-07 MED ORDER — IOHEXOL 300 MG/ML  SOLN
75.0000 mL | Freq: Once | INTRAMUSCULAR | Status: AC | PRN
  Administered 2020-04-07: 75 mL via INTRAVENOUS

## 2020-04-07 MED ORDER — LOPERAMIDE HCL 2 MG PO CAPS
2.0000 mg | ORAL_CAPSULE | Freq: Once | ORAL | Status: AC | PRN
  Administered 2020-04-07: 2 mg via ORAL
  Filled 2020-04-07: qty 1

## 2020-04-07 MED ORDER — SODIUM CHLORIDE 0.9 % IV SOLN
INTRAVENOUS | Status: DC | PRN
  Administered 2020-04-07: 1000 mL via INTRAVENOUS

## 2020-04-07 NOTE — Progress Notes (Signed)
Dr. Lanney Gins notified of CT results.  Sharion Settler, NP notified of CT results.

## 2020-04-07 NOTE — Progress Notes (Signed)
Cross Cover Brief Note CT scan results review per [employer] and RN request.  No further orders now.  Will need follow up/ Pulmonology already following. Likely need oncology consult in am

## 2020-04-07 NOTE — Progress Notes (Signed)
PROGRESS NOTE    Carol Gay  EUM:[id number] DOB: 1946/09/18 DOA: 04/03/2020 PCP: Glean Hess, MD    Assessment & Plan:   Active Problems:   Pneumonia   RLL pneumonia: continue on IV ancef & azithromycin. Continue on bronchodilators. Encourage incentive spirometry & flutter valve. Continue on supplemental oxygen and wean as tolerated, currently on HFNC. Pulmon consulted   COPD exacerbation: severe. Continue on IV solumedrol, bronchodilators & IV azithromycin. Continue on supplemental oxygen and wean as tolerated, currently on 40 HFNC. Oxygen demand is labile. Previous smoker but quit 3-4 years ago. Pulmon recs apprec    Acute on chronic hypoxic respiratory failure: secondary to above. Labile. At home pt is on 6L [place] and currently 40 HFNC. Pulmon following and recs apprec   Unlikely bacteremia: growing 1/4 gram positive cocci, stap capitis, likely containment. Repeat blood cxs NGTD. Echo shows ER 54-00%, grade I diastolic dysfunction & pulmonary HTN.  Urine cx NGTD  Leukocytosis: resolved  HTN: continue on amlodipine. IV labetalol prn  Hypokalemia: KCl repleted. Will continue to monitor     DVT prophylaxis: lovenox  Code Status: full  Family Communication: Disposition Plan: likely d/c w/ home health. OT consulted. PT recs HH   Status is: Inpatient  Remains inpatient appropriate because:IV treatments appropriate due to intensity of illness or inability to take PO, still requiring a lot of oxygeb    Dispo: The patient is from: Home              Anticipated d/c is to: Home  Health vs SNF               Anticipated d/c date is: 2 days               Patient currently is not medically stable to d/c.    Consultants:   pulmon   Procedures:    Antimicrobials: ancef, azithromycin   Subjective: Pt c/o shortness of breath, unchanged from day prior  Objective: Vitals:   04/07/20 0306 04/07/20 0407 04/07/20 0719 04/07/20 0744  BP:  (!) 143/67 137/74   Pulse:   82 83   Resp:   18   Temp:  (!) 97.5 F (36.4 C) 97.9 F (36.6 C)   TempSrc:      SpO2: 93% 93% 95% 92%  Weight:  78.2 kg    Height:        Intake/Output Summary (Last 24 hours) at 04/07/2020 0756 Last data filed at 04/07/2020 0601 Gross per 24 hour  Intake 1409.99 ml  Output 2200 ml  Net -790.01 ml   Filed Weights   04/05/20 0346 04/06/20 0637 04/07/20 0407  Weight: 78.8 kg 78.8 kg 78.2 kg    Examination:  General exam: Appears calm but uncomfortable  Respiratory system: diminished breath sounds b/l. No rales Cardiovascular system: S1 & S2+. No rubs, gallops or clicks.  Gastrointestinal system: Abdomen is obese, soft and nontender.  Hyperactive bowel sounds heard. Central nervous system: Alert and oriented. Moves all 4 extremities  Psychiatry: Judgement and insight appear normal. Flat mood and affect      Data Reviewed: I have personally reviewed following labs and imaging studies  CBC: Recent Labs  Lab 04/03/20 1543 04/04/20 0538 04/05/20 0402 04/06/20 0616 04/07/20 0300  WBC 9.4 6.3 12.5* 8.6 7.0  NEUTROABS 6.2  --   --   --   --   HGB 15.7* 12.5 13.8 14.8 14.0  HCT 45.8 37.2 40.4 42.7 41.5  MCV 88.6 89.4 89.0  89.9 89.8  PLT 312 246 267 226 419   Basic Metabolic Panel: Recent Labs  Lab 04/03/20 1543 04/04/20 0538 04/05/20 0402 04/06/20 0616 04/07/20 0300  NA 139 142 141 140 138  K 4.0 4.5 3.5 3.4* 3.4*  CL 103 112* 107 100 101  CO2 26 22 23 27 30   GLUCOSE 146* 149* 142* 134* 123*  BUN 9 8 12 21  32*  CREATININE 1.05* 0.87 0.76 0.75 0.78  CALCIUM 10.5* 9.3 10.0 10.1 9.7   GFR: Estimated Creatinine Clearance: 59.7 mL/min (by C-G formula based on SCr of 0.78 mg/dL). Liver Function Tests: Recent Labs  Lab 04/03/20 1543  AST 26  ALT 14  ALKPHOS 84  BILITOT 1.0  PROT 9.1*  ALBUMIN 4.4   No results for input(s): LIPASE, AMYLASE in the last 168 hours. No results for input(s): AMMONIA in the last 168 hours. Coagulation Profile: No results for  input(s): INR, PROTIME in the last 168 hours. Cardiac Enzymes: No results for input(s): CKTOTAL, CKMB, CKMBINDEX, TROPONINI in the last 168 hours. BNP (last 3 results) No results for input(s): PROBNP in the last 8760 hours. HbA1C: No results for input(s): HGBA1C in the last 72 hours. CBG: Recent Labs  Lab 04/05/20 1142  GLUCAP 115*   Lipid Profile: No results for input(s): CHOL, HDL, LDLCALC, TRIG, CHOLHDL, LDLDIRECT in the last 72 hours. Thyroid Function Tests: No results for input(s): TSH, T4TOTAL, FREET4, T3FREE, THYROIDAB in the last 72 hours. Anemia Panel: No results for input(s): VITAMINB12, FOLATE, FERRITIN, TIBC, IRON, RETICCTPCT in the last 72 hours. Sepsis Labs: Recent Labs  Lab 04/03/20 1704 04/03/20 1813 04/05/20 1317 04/06/20 0616 04/07/20 0300  PROCALCITON  --   --  <0.10 <0.10 <0.10  LATICACIDVEN 3.3* 3.9*  --   --   --     Recent Results (from the past 240 hour(s))  SARS Coronavirus 2 by RT PCR (hospital order, performed in [place] hospital lab) Nasopharyngeal Nasopharyngeal Swab     Status: None   Collection Time: 04/03/20  5:04 PM   Specimen: Nasopharyngeal Swab  Result Value Ref Range Status   SARS Coronavirus 2 NEGATIVE NEGATIVE Final    Comment: (NOTE) SARS-CoV-2 target nucleic acids are NOT DETECTED.  The SARS-CoV-2 RNA is generally detectable in upper and lower respiratory specimens during the acute phase of infection. The lowest concentration of SARS-CoV-2 viral copies this assay can detect is 250 copies / mL. A negative result does not preclude SARS-CoV-2 infection and should not be used as the sole basis for treatment or other patient management decisions.  A negative result may occur with improper specimen collection / handling, submission of specimen other than nasopharyngeal swab, presence of viral mutation(s) within the areas targeted by this assay, and inadequate number of viral copies (<250 copies / mL). A negative result must be  combined with clinical observations, patient history, and epidemiological information.  Fact Sheet for Patients:   StrictlyIdeas.no  Fact Sheet for Healthcare Providers: BankingDealers.co.za  This test is not yet approved or  cleared by the [place] FDA and has been authorized for detection and/or diagnosis of SARS-CoV-2 by FDA under an Emergency Use Authorization (EUA).  This EUA will remain in effect (meaning this test can be used) for the duration of the COVID-19 declaration under Section 564(b)(1) of the Act, 21 U.S.C. section 360bbb-3(b)(1), unless the authorization is terminated or revoked sooner.  Performed at [facility], [address], [place], [place] [address]   Culture, blood (routine x  2)     Status: None (Preliminary result)   Collection Time: 04/03/20  5:23 PM   Specimen: BLOOD  Result Value Ref Range Status   Specimen Description BLOOD  Final   Special Requests NONE  Final   Culture   Final    NO GROWTH 4 DAYS Performed at [facility], [address], [place], [place] [address]    Report Status PENDING  Incomplete  Culture, blood (routine x 2)     Status: Abnormal   Collection Time: 04/03/20  5:24 PM   Specimen: BLOOD LEFT HAND  Result Value Ref Range Status   Specimen Description   Final    BLOOD LEFT HAND Performed at [facility], [place] [address], [place], [place] [address]    Special Requests   Final    NONE Performed at [facility], [address] [place], [place] [address]    Culture  Setup Time   Final    GRAM POSITIVE COCCI AEROBIC BOTTLE ONLY CRITICAL RESULT CALLED TO, READ BACK BY AND VERIFIED WITH: ABBY ELLINGTON 04/04/20 AT 1750 BY AR    Culture (A)  Final    STAPHYLOCOCCUS CAPITIS THE SIGNIFICANCE OF ISOLATING THIS ORGANISM FROM A SINGLE SET OF BLOOD CULTURES WHEN MULTIPLE SETS ARE DRAWN IS UNCERTAIN. PLEASE NOTIFY THE MICROBIOLOGY DEPARTMENT WITHIN  ONE WEEK IF SPECIATION AND SENSITIVITIES ARE REQUIRED. Performed at [facility], [place] [address], [place], [place] [address]    Report Status 04/05/2020 FINAL  Final  Blood Culture ID Panel (Reflexed)     Status: Abnormal   Collection Time: 04/03/20  5:24 PM  Result Value Ref Range Status   Enterococcus species NOT DETECTED NOT DETECTED Final   Listeria monocytogenes NOT DETECTED NOT DETECTED Final   Staphylococcus species DETECTED (A) NOT DETECTED Final    Comment: Methicillin (oxacillin) susceptible coagulase negative staphylococcus. Possible blood culture contaminant (unless isolated from more than one blood culture draw or clinical case suggests pathogenicity). No antibiotic treatment is indicated for blood  culture contaminants. CRITICAL RESULT CALLED TO, READ BACK BY AND VERIFIED WITH: ABBY ELLINGTON 04/04/20 @1730  BY ACR    Staphylococcus aureus (BCID) NOT DETECTED NOT DETECTED Final   Methicillin resistance NOT DETECTED NOT DETECTED Final   Streptococcus species NOT DETECTED NOT DETECTED Final   Streptococcus agalactiae NOT DETECTED NOT DETECTED Final   Streptococcus pneumoniae NOT DETECTED NOT DETECTED Final   Streptococcus pyogenes NOT DETECTED NOT DETECTED Final   Acinetobacter baumannii NOT DETECTED NOT DETECTED Final   Enterobacteriaceae species NOT DETECTED NOT DETECTED Final   Enterobacter cloacae complex NOT DETECTED NOT DETECTED Final   Escherichia coli NOT DETECTED NOT DETECTED Final   Klebsiella oxytoca NOT DETECTED NOT DETECTED Final   Klebsiella pneumoniae NOT DETECTED NOT DETECTED Final   Proteus species NOT DETECTED NOT DETECTED Final   Serratia marcescens NOT DETECTED NOT DETECTED Final   Haemophilus influenzae NOT DETECTED NOT DETECTED Final   Neisseria meningitidis NOT DETECTED NOT DETECTED Final   Pseudomonas aeruginosa NOT DETECTED NOT DETECTED Final   Candida albicans NOT DETECTED NOT DETECTED Final   Candida glabrata NOT DETECTED NOT DETECTED Final     Candida krusei NOT DETECTED NOT DETECTED Final   Candida parapsilosis NOT DETECTED NOT DETECTED Final   Candida tropicalis NOT DETECTED NOT DETECTED Final    Comment: Performed at [facility], [address], [place], [place] [address]  Urine Culture     Status: None   Collection Time:  04/05/20 11:50 AM   Specimen: Urine, Random  Result Value Ref Range Status   Specimen Description   Final    URINE, RANDOM Performed at [facility], [address] [place], [place] [address]    Special Requests   Final    NONE Performed at [facility], [address] [place], [place] [address]    Culture   Final    NO GROWTH Performed at [facility], [place] [address] [place], [place] [address]    Report Status 04/06/2020 FINAL  Final  CULTURE, BLOOD (ROUTINE X 2) w Reflex to ID Panel     Status: None (Preliminary result)   Collection Time: 04/05/20  1:17 PM   Specimen: BLOOD  Result Value Ref Range Status   Specimen Description BLOOD RAC  Final   Special Requests   Final    BOTTLES DRAWN AEROBIC AND ANAEROBIC Blood Culture adequate volume   Culture   Final    NO GROWTH 2 DAYS Performed at [facility], [address], [place], [place] [address]    Report Status PENDING  Incomplete  CULTURE, BLOOD (ROUTINE X 2) w Reflex to ID Panel     Status: None (Preliminary result)   Collection Time: 04/05/20  1:21 PM   Specimen: BLOOD  Result Value Ref Range Status   Specimen Description BLOOD LAC  Final   Special Requests   Final    BOTTLES DRAWN AEROBIC AND ANAEROBIC Blood Culture adequate volume   Culture   Final    NO GROWTH 2 DAYS Performed at [facility], [address], [place], [place] [address]    Report Status PENDING  Incomplete  MRSA PCR Screening     Status: None   Collection Time: 04/05/20  2:45 PM   Specimen: Nasopharyngeal  Result Value Ref Range Status   MRSA by PCR NEGATIVE NEGATIVE Final    Comment:        The  GeneXpert MRSA Assay (FDA approved for NASAL specimens only), is one component of a comprehensive MRSA colonization surveillance program. It is not intended to diagnose MRSA infection nor to guide or monitor treatment for MRSA infections. Performed at [facility], [place] [place], [place] [address]   Respiratory Panel by PCR     Status: None   Collection Time: 04/05/20  2:45 PM   Specimen: Nasopharyngeal Swab; Respiratory  Result Value Ref Range Status   Adenovirus NOT DETECTED NOT DETECTED Final   Coronavirus 229E NOT DETECTED NOT DETECTED Final    Comment: (NOTE) The Coronavirus on the Respiratory Panel, DOES NOT test for the novel  Coronavirus (2019 nCoV)    Coronavirus HKU1 NOT DETECTED NOT DETECTED Final   Coronavirus NL63 NOT DETECTED NOT DETECTED Final   Coronavirus OC43 NOT DETECTED NOT DETECTED Final   Metapneumovirus NOT DETECTED NOT DETECTED Final   Rhinovirus / Enterovirus NOT DETECTED NOT DETECTED Final   Influenza A NOT DETECTED NOT DETECTED Final   Influenza B NOT DETECTED NOT DETECTED Final   Parainfluenza Virus 1 NOT DETECTED NOT DETECTED Final   Parainfluenza Virus 2 NOT DETECTED NOT DETECTED Final   Parainfluenza Virus 3 NOT DETECTED NOT DETECTED Final   Parainfluenza Virus 4 NOT DETECTED NOT DETECTED Final   Respiratory Syncytial Virus NOT DETECTED NOT DETECTED Final   Bordetella pertussis NOT DETECTED NOT DETECTED Final   Chlamydophila pneumoniae NOT DETECTED NOT DETECTED Final   Mycoplasma pneumoniae NOT DETECTED NOT DETECTED Final  Comment: Performed at [facility], [place] [address], [place], [place] [address]  Expectorated sputum assessment w rflx to resp cult     Status: None   Collection Time: 04/05/20  5:50 PM   Specimen: Sputum  Result Value Ref Range Status   Specimen Description SPUTUM  Final   Special Requests NONE  Final   Sputum evaluation   Final    THIS SPECIMEN IS ACCEPTABLE FOR SPUTUM CULTURE Performed at  [facility], [address] [place], [place] [address]    Report Status 04/06/2020 FINAL  Final         Radiology Studies: [facility] Chest Port 1 View  Result Date: 04/05/2020 CLINICAL DATA:  74 year old female with a history of shortness of breath EXAM: PORTABLE CHEST 1 VIEW COMPARISON:  04/03/2020 FINDINGS: Cardiomediastinal silhouette unchanged. Reticulonodular opacities of the right greater than left lung, unchanged from the comparison plain film. No pneumothorax. No pleural effusion. Unchanged right IJ port catheter. IMPRESSION: Similar appearance of the chest x-ray with right greater than left reticulonodular opacities, potentially multifocal infection. Unchanged right IJ port catheter. Electronically Signed   By: Corrie Mckusick D.O.   On: 04/05/2020 15:27        Scheduled Meds: . amLODipine  5 mg Oral Daily  . atorvastatin  20 mg Oral QHS  . Chlorhexidine Gluconate Cloth  6 each Topical Daily  . cyclobenzaprine  10 mg Oral BID  . enoxaparin (LOVENOX) injection  40 mg Subcutaneous Q24H  . furosemide  20 mg Intravenous Daily  . guaiFENesin  600 mg Oral BID  . ipratropium-albuterol  3 mL Nebulization Q6H  . methylPREDNISolone (SOLU-MEDROL) injection  40 mg Intravenous Q12H   Continuous Infusions: . azithromycin 500 mg (04/06/20 2014)  .  ceFAZolin (ANCEF) IV 2 g (04/07/20 0601)     LOS: 4 days    Time spent: 32 mins     Wyvonnia Dusky, MD [facility] Pager [phone]  If 7PM-7AM, please contact night-coverage [email or web address] 04/07/2020, 7:56 AM

## 2020-04-07 NOTE — Progress Notes (Signed)
Pulmonary Medicine          Date: 04/07/2020,   MRN# [id number] Carol Gay 04-07-1946     AdmissionWeight: 77.3 kg                 CurrentWeight: 78.2 kg   Referring physician: Dr Jimmye Norman    CHIEF COMPLAINT:   Acute on chronic hypoxemic respiratory failure   SUBJECTIVE   Patient remains on HFNC 50/40.  She had PT today was not able to do much, was desaturationg to 70s with few steps.    Plan for CT chest today due to significant progressive hypoxemia, will evaluate lung parenchyma for any potential neoplasm related lesions as well.   PAST MEDICAL HISTORY   Past Medical History:  Diagnosis Date  . Aortic atherosclerosis ([place])   . Arm pain   . Asthma   . Benign neoplasm of rectosigmoid junction   . Benign neoplasm of transverse colon   . CAD in native artery   . Cancer ([place]) 09/2015   rectum  . Cigarette smoker   . Claustrophobia   . Colon cancer ([place])   . 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 COPD (chronic obstructive pulmonary disease) ([place])   . DDD (degenerative disc disease), lumbar       . Dermatophytoses   . Dyslipidemia   . H/O blood clots    left leg, veins stripped  . Hemorrhoids   . Hypertension   . Last menstrual period (LMP) > 10 days ago 1994  . Myocardial infarction [facility])    "mild" - age 87  . Personal history of chemotherapy   . Personal history of radiation therapy   . Pulmonary emphysema ([place])   . Rectal bleeding   . Shortness of breath dyspnea   . Smokers' cough ([place])   . Spinal headache    with one of my children's birth  . Vertigo   . Wears dentures    has full upper and lower - doesn't wear     SURGICAL HISTORY   Past Surgical History:  Procedure Laterality Date  . APPENDECTOMY    . BREAST BIOPSY Left 1970's   neg  . COLONOSCOPY WITH PROPOFOL N/A 11/06/2015   Procedure: COLONOSCOPY WITH PROPOFOL;  Surgeon: Lucilla Lame, MD;  Location: [place];  Service: Endoscopy;   Laterality: N/A;  LEAVE PT EARLY  . EXPLORATORY LAPAROTOMY    . LESION EXCISION  11/03/15   Anal - Dr Dahlia Byes, [facility]  . LESION REMOVAL  1970's   from tailbone  . [place] SURGERY  1997  . OVARIAN CYST REMOVAL Left   . POLYPECTOMY  11/06/2015   Procedure: POLYPECTOMY;  Surgeon: Lucilla Lame, MD;  Location: [place];  Service: Endoscopy;;  . PORTACATH PLACEMENT N/A 12/10/2015   Procedure: INSERTION PORT-A-CATH;  Surgeon: Jules Husbands, MD;  Location: [facility];  Service: General;  Laterality: N/A;  . RECTAL EXAM UNDER ANESTHESIA N/A 06/23/2016   Procedure: RECTAL EXAM UNDER ANESTHESIA;  Surgeon: Jules Husbands, MD;  Location: [facility];  Service: General;  Laterality: N/A;  . VEIN LIGATION AND STRIPPING       FAMILY HISTORY   Family History  Problem Relation Age of Onset  . Cancer Mother        lymphoma  . Heart disease Mother   . Cancer Maternal Grandmother        breast  . Birth defects  Maternal Grandmother      SOCIAL HISTORY   Social History   Tobacco Use  . Smoking status: Former Smoker    Packs/day: 1.00    Years: 57.00    Pack years: 57.00    Types: Cigarettes    Quit date: 03/30/2016    Years since quitting: 4.0  . Smokeless tobacco: Never Used  . Tobacco comment: Quit in July 2017  Vaping Use  . Vaping Use: Never used  Substance Use Topics  . Alcohol use: No    Alcohol/week: 0.0 standard drinks  . Drug use: No     MEDICATIONS    Home Medication:    Current Medication:  Current Facility-Administered Medications:  .  0.9 %  sodium chloride infusion, , Intravenous, PRN, Wyvonnia Dusky, MD .  acetaminophen (TYLENOL) tablet 650 mg, 650 mg, Oral, Q6H PRN, 650 mg at 04/07/20 0920 **OR** acetaminophen (TYLENOL) suppository 650 mg, 650 mg, Rectal, Q6H PRN, Mansy, Jan A, MD .  amLODipine (NORVASC) tablet 5 mg, 5 mg, Oral, Daily, Mansy, Jan A, MD, 5 mg at 04/07/20 0904 .  atorvastatin (LIPITOR) tablet 20 mg, 20 mg, Oral, QHS, Mansy, Jan A, MD,  20 mg at 04/06/20 2118 .  azithromycin (ZITHROMAX) 500 mg in sodium chloride 0.9 % 250 mL IVPB, 500 mg, Intravenous, Q24H, Mansy, Jan A, MD, Last Rate: 250 mL/hr at 04/06/20 2014, 500 mg at 04/06/20 2014 .  calcium carbonate (TUMS - dosed in mg elemental calcium) chewable tablet 200 mg of elemental calcium, 1 tablet, Oral, TID PRN, Mansy, Jan A, MD, 200 mg of elemental calcium at 04/06/20 2119 .  ceFAZolin (ANCEF) IVPB 2g/100 mL premix, 2 g, Intravenous, Q8H, Wyvonnia Dusky, MD, Last Rate: 200 mL/hr at 04/07/20 0601, 2 g at 04/07/20 0601 .  Chlorhexidine Gluconate Cloth 2 % PADS 6 each, 6 each, Topical, Daily, Wyvonnia Dusky, MD, 6 each at 04/07/20 0920 .  cyclobenzaprine (FLEXERIL) tablet 10 mg, 10 mg, Oral, BID, Mansy, Jan A, MD, 10 mg at 04/07/20 1138 .  enoxaparin (LOVENOX) injection 40 mg, 40 mg, Subcutaneous, Q24H, Mansy, Jan A, MD, 40 mg at 04/06/20 2018 .  guaiFENesin (MUCINEX) 12 hr tablet 600 mg, 600 mg, Oral, BID, Mansy, Jan A, MD, 600 mg at 04/07/20 0905 .  guaiFENesin-dextromethorphan (ROBITUSSIN DM) 100-10 MG/5ML syrup 5 mL, 5 mL, Oral, Q4H PRN, Mansy, Jan A, MD .  hydrOXYzine (ATARAX/VISTARIL) tablet 25 mg, 25 mg, Oral, BID PRN, Mansy, Jan A, MD .  ipratropium-albuterol (DUONEB) 0.5-2.5 (3) MG/3ML nebulizer solution 3 mL, 3 mL, Nebulization, Q6H, Wyvonnia Dusky, MD, 3 mL at 04/07/20 0743 .  labetalol (NORMODYNE) injection 20 mg, 20 mg, Intravenous, Q3H PRN, Mansy, Jan A, MD .  levalbuterol Penne Lash) nebulizer solution 0.63 mg, 0.63 mg, Nebulization, Q6H PRN, Mansy, Jan A, MD, 0.63 mg at 04/05/20 1411 .  loperamide (IMODIUM) capsule 2 mg, 2 mg, Oral, Once PRN, Wyvonnia Dusky, MD .  magnesium hydroxide (MILK OF MAGNESIA) suspension 30 mL, 30 mL, Oral, Daily PRN, Mansy, Jan A, MD .  methylPREDNISolone sodium succinate (SOLU-MEDROL) 40 mg/mL injection 40 mg, 40 mg, Intravenous, Q12H, Ottie Glazier, MD, 40 mg at 04/07/20 0133 .  ondansetron (ZOFRAN) tablet 4 mg, 4 mg,  Oral, Q6H PRN **OR** ondansetron (ZOFRAN) injection 4 mg, 4 mg, Intravenous, Q6H PRN, Mansy, Jan A, MD, 4 mg at 04/06/20 2131 .  traZODone (DESYREL) tablet 25 mg, 25 mg, Oral, QHS PRN, Mansy, Jan A, MD, 25 mg at 04/06/20 2218  Facility-Administered Medications Ordered in Other Encounters:  .  albuterol (PROVENTIL) (2.5 MG/3ML) 0.083% nebulizer solution 2.5 mg, 2.5 mg, Nebulization, Once, Laverle Hobby, MD    ALLERGIES   Bee venom, Aspirin, Codeine, Morphine and related, and Cat hair extract     REVIEW OF SYSTEMS    Review of Systems:  Gen:  Denies  fever, sweats, chills weigh loss  HEENT: Denies blurred vision, double vision, ear pain, eye pain, hearing loss, nose bleeds, sore throat Cardiac:  No dizziness, chest pain or heaviness, chest tightness,edema Resp: reports cough or sputum porduction, shortness of breath,wheezing, hemoptysis,  Gi: Denies swallowing difficulty, stomach pain, nausea or vomiting, diarrhea, constipation, bowel incontinence Gu:  Denies bladder incontinence, burning urine Ext:   Denies Joint pain, stiffness or swelling Skin: Denies  skin rash, easy bruising or bleeding or hives Endoc:  Denies polyuria, polydipsia , polyphagia or weight change Psych:   Denies depression, insomnia or hallucinations   Other:  All other systems negative   VS: BP (!) 145/76 (BP Location: Right Arm)   Pulse 90   Temp 97.6 F (36.4 C) (Oral)   Resp 20   Ht 5\' 2"  (1.575 m)   Wt 78.2 kg   SpO2 92%   BMI 31.53 kg/m      PHYSICAL EXAM    GENERAL:NAD, no fevers, chills, no weakness no fatigue HEAD: Normocephalic, atraumatic.  EYES: Pupils equal, round, reactive to light. Extraocular muscles intact. No scleral icterus.  MOUTH: Moist mucosal membrane. Dentition intact. No abscess noted.  EAR, NOSE, THROAT: Clear without exudates. No external lesions.  NECK: Supple. No thyromegaly. No nodules. No JVD.  PULMONARY: Diffuse coarse rhonchi right sided  +wheezes CARDIOVASCULAR: S1 and S2. Regular rate and rhythm. No murmurs, rubs, or gallops. No edema. Pedal pulses 2+ bilaterally.  GASTROINTESTINAL: Soft, nontender, nondistended. No masses. Positive bowel sounds. No hepatosplenomegaly.  MUSCULOSKELETAL: No swelling, clubbing, or edema. Range of motion full in all extremities.  NEUROLOGIC: Cranial nerves II through XII are intact. No gross focal neurological deficits. Sensation intact. Reflexes intact.  SKIN: No ulceration, lesions, rashes, or cyanosis. Skin warm and dry. Turgor intact.  PSYCHIATRIC: Mood, affect within normal limits. The patient is awake, alert and oriented x 3. Insight, judgment intact.       IMAGING    DG Chest 1 View  Result Date: 04/03/2020 CLINICAL DATA:  Shortness of breath EXAM: CHEST  1 VIEW COMPARISON:  01/28/2020 FINDINGS: Right-sided chest port in stable positioning. Heart size is normal. Atherosclerotic calcification of the aortic knob. Interval development of patchy and interstitial opacities within the mid to lower aspect of the right lung. Left lung is hyperexpanded and clear. Scattered granulomas are unchanged. No pleural effusion or pneumothorax identified. IMPRESSION: Interval development of patchy and interstitial opacities within the mid to lower aspect of the right lung. Findings suspicious for pneumonia. Radiographic follow-up to resolution is recommended. Electronically Signed   By: Davina Poke D.O.   On: 04/03/2020 16:24   DG Chest Port 1 View  Result Date: 04/05/2020 CLINICAL DATA:  74 year old female with a history of shortness of breath EXAM: PORTABLE CHEST 1 VIEW COMPARISON:  04/03/2020 FINDINGS: Cardiomediastinal silhouette unchanged. Reticulonodular opacities of the right greater than left lung, unchanged from the comparison plain film. No pneumothorax. No pleural effusion. Unchanged right IJ port catheter. IMPRESSION: Similar appearance of the chest x-ray with right greater than left  reticulonodular opacities, potentially multifocal infection. Unchanged right IJ port catheter. Electronically Signed   By: Corrie Mckusick  D.O.   On: 04/05/2020 15:27   ECHOCARDIOGRAM COMPLETE  Result Date: 04/05/2020    ECHOCARDIOGRAM REPORT   Patient Name:   LELAINA OATIS Date of Exam: 04/05/2020 Medical Rec #:  [id number]        Height:       62.0 in Accession #:    [id number]       Weight:       173.7 lb Date of Birth:  Jan 20, 1946        BSA:          1.801 m Patient Age:    74 years         BP:           143/68 mmHg Patient Gender: F                HR:           100 bpm. Exam Location:  [facility] Procedure: 2D Echo Indications:     Bacteremia  History:         Patient has no prior history of Echocardiogram examinations.                  Advanced Stage COPD; Risk Factors:Current Smoker and                  Hypertension.  Sonographer:     L Thornton-Maynard Referring Phys:  [id number] Wyvonnia Dusky Diagnosing Phys: Ida Rogue MD  Sonographer Comments: Image acquisition challenging due to COPD. IMPRESSIONS  1. Left ventricular ejection fraction, by estimation, is 55 to 60%. The left ventricle has normal function. The left ventricle has no regional wall motion abnormalities. There is mild left ventricular hypertrophy. Left ventricular diastolic parameters are consistent with Grade I diastolic dysfunction (impaired relaxation).  2. Right ventricular systolic function is mildly reduced. The right ventricular size is moderately enlarged. There is severely elevated pulmonary artery systolic pressure. The estimated right ventricular systolic pressure is 33.2 mmHg.  3. No valve vegetation noted. FINDINGS  Left Ventricle: Left ventricular ejection fraction, by estimation, is 55 to 60%. The left ventricle has normal function. The left ventricle has no regional wall motion abnormalities. The left ventricular internal cavity size was normal in size. There is  mild left ventricular hypertrophy. Left ventricular diastolic  parameters are consistent with Grade I diastolic dysfunction (impaired relaxation). Right Ventricle: The right ventricular size is moderately enlarged. No increase in right ventricular wall thickness. Right ventricular systolic function is mildly reduced. There is severely elevated pulmonary artery systolic pressure. The tricuspid regurgitant velocity is 4.48 m/s, and with an assumed right atrial pressure of 10 mmHg, the estimated right ventricular systolic pressure is 95.1 mmHg. Left Atrium: Left atrial size was normal in size. Right Atrium: Right atrial size was normal in size. Pericardium: There is no evidence of pericardial effusion. Mitral Valve: The mitral valve is normal in structure. Normal mobility of the mitral valve leaflets. Mild mitral valve regurgitation. No evidence of mitral valve stenosis. MV peak gradient, 5.4 mmHg. The mean mitral valve gradient is 3.0 mmHg. Tricuspid Valve: The tricuspid valve is normal in structure. Tricuspid valve regurgitation is mild . No evidence of tricuspid stenosis. Aortic Valve: The aortic valve was not well visualized. Aortic valve regurgitation is not visualized. No aortic stenosis is present. Aortic valve mean gradient measures 5.0 mmHg. Aortic valve peak gradient measures 7.0 mmHg. Aortic valve area, by VTI measures 1.31 cm. Pulmonic Valve: The pulmonic valve was normal in structure. Pulmonic valve  regurgitation is not visualized. No evidence of pulmonic stenosis. Aorta: The aortic root is normal in size and structure. Venous: The inferior vena cava is normal in size with less than 50% respiratory variability, suggesting right atrial pressure of 8 mmHg. IAS/Shunts: No atrial level shunt detected by color flow Doppler.  LEFT VENTRICLE PLAX 2D LVIDd:         3.98 cm  Diastology LVIDs:         2.65 cm  LV e' lateral:   6.31 cm/s LV PW:         1.61 cm  LV E/e' lateral: 12.4 LV IVS:        1.55 cm  LV e' medial:    5.33 cm/s LVOT diam:     1.70 cm  LV E/e' medial:  14.7  LV SV:         38 LV SV Index:   21 LVOT Area:     2.27 cm  RIGHT VENTRICLE RV S prime:     14.60 cm/s TAPSE (M-mode): 1.8 cm LEFT ATRIUM         Index LA diam:    3.50 cm 1.94 cm/m  AORTIC VALVE AV Area (Vmax):    1.47 cm AV Area (Vmean):   1.25 cm AV Area (VTI):     1.31 cm AV Vmax:           132.00 cm/s AV Vmean:          104.000 cm/s AV VTI:            0.292 m AV Peak Grad:      7.0 mmHg AV Mean Grad:      5.0 mmHg LVOT Vmax:         85.50 cm/s LVOT Vmean:        57.500 cm/s LVOT VTI:          0.168 m LVOT/AV VTI ratio: 0.58 MITRAL VALVE                TRICUSPID VALVE MV Area (PHT): 5.00 cm     TR Peak grad:   80.3 mmHg MV Peak grad:  5.4 mmHg     TR Vmax:        448.00 cm/s MV Mean grad:  3.0 mmHg MV Vmax:       1.16 m/s     SHUNTS MV Vmean:      83.0 cm/s    Systemic VTI:  0.17 m MV E velocity: 78.40 cm/s   Systemic Diam: 1.70 cm MV A velocity: 117.00 cm/s MV E/A ratio:  0.67 Ida Rogue MD Electronically signed by Ida Rogue MD Signature Date/Time: 04/05/2020/11:07:53 PM    Final             ASSESSMENT/PLAN   Acute on chronic hypoxemic respiratory failure  - due to right lung community acquired pneumonia  -possible aspiration related vs bacterial/viral etiology  -respiratory culture via expectorated sputum peding -Repeat blood culturues - 1 + staph capitis likely contaminant -hx of squamous stage 3 rectal CA- s/p chemoradiotherapy -RVP-negative -MRSA nasal -negative -Procalcitonin trend -Legionella and strep pneumo negative-Bnegative  -CXR reviewed as above with RLL infiltrate -Swallow evaluation to r/o recurrent aspiration due to RLL inifiltrate - narrow Antimicrobial regimen post above workup   Community acquired pneumonia  - present on admission   - etiology not clear at this time - possible viral/bacterial vs aspiration related as above  - Vanc/Cefepime >>Zithromax/Anceph  -Solumedrol   - s/p IVF  -  repeat blood cx -noted legionella/strep pneumo -RVP -  repeat CXR in am  Reviewed ABG with hypoxemia without hypercapnia   Severe acute exacerbation of COPD  - agree with current COPD carepath  - continue to wean steroids  - IS - I have encouraged patient to keep working with spirometer - currently tV at 850cc per breath  - c/w antibiotics  - DuoNEb therapy per RT   Chronic hypoxemia   - PT/OT and consider palliative care    - monitor diuresis-stopping lasix 7/19   - metaneb for recruitment maneuvers       Thank you for allowing me to participate in the care of this patient.   Patient/Family are satisfied with care plan and all questions have been answered.   This document was prepared using Dragon voice recognition software and may include unintentional dictation errors.     Ottie Glazier, M.D.  Division of [place]

## 2020-04-07 NOTE — Evaluation (Signed)
Occupational Therapy Evaluation Patient Details Name: Carol Gay MRN: [id number] DOB: 03-16-1946 Today's Date: 04/07/2020    History of Present Illness Pt admitted for COPD exacerbation with complaints of SOB symptoms. History includes COPD on 6L of O2, HTN, HLD, asthma, COPD, and colon cancer.   Clinical Impression   Pt presents semi-reclined and pleasant, on 40L HFNC and SpO2 94%. Pt lives alone and is MOD I for dressing and toileting at baseline. She has difficulties getting out of the bathtub, ambulates with a Rollator and is on 6L O2 at home. At baseline, she has limited community mobility due to endurance. Pt has a PCA who assists 2x/week with showering and neighbors who deliver her meals each night. During session, pt completed semi-reclined to sit and tolerated sitting EOB x10 min with SpO2 initially 85%, stabilizing at 87-91% with pursed lip breathing. She became fatigued, and returned to semi-reclined with increased effort. During repositioning, pt SpO2 decreased to 69%, HR 109 and she c/o dizziness. Instructed pt in pursed lip breathing and after 6 min, SpO2 returned to 90%. Given pt current limited activity tolerance, anticipate SETUP A for seated UB ADL and MOD A for sit to stand LB ADL and limited functional mobility. Pt will benefit from skilled acute OT services to address impairments (see below for more details) impacting her ADL performance, safety and independence. Recommend HHOT upon discharge in order to maximize pt functional outcomes.      Follow Up Recommendations  Home health OT;Supervision - Intermittent    Equipment Recommendations  Tub/shower seat (has a tub bench, would benefit from additional shower chair)    Recommendations for Other Services       Precautions / Restrictions Restrictions Weight Bearing Restrictions: No Other Position/Activity Restrictions: Monitor O2 throughout; desat to 69% with bed mobility on 40L HFNC      Mobility Bed  Mobility Overal bed mobility: Modified Independent (pt tried to scoot herself up in bed, desat to 69% with increased effort)             General bed mobility comments: use of bed rails and increased time/effort  Transfers                 General transfer comment: deferred this date 2/2 pt O2 levels    Balance Overall balance assessment: Needs assistance Sitting-balance support: Feet supported;Single extremity supported Sitting balance-Leahy Scale: Good Sitting balance - Comments: pt used RUE for support while seated, good dynamic sitting balance       Standing balance comment: deferred this date                           ADL either performed or assessed with clinical judgement   ADL Overall ADL's : Needs assistance/impaired                                       General ADL Comments: Pt sat EOB x10 min with SpO2 85-91% with intermittent pursed lip breathing. Simulated donning socks while seated with MOD I. Pt SpO2 dropped to 69% when attempting to scoot up in bed. Given decreased activity tolerance, anticipate SETUP A for UB ADL and MOD A for LB ADL.     Vision Baseline Vision/History: Wears glasses Wears Glasses: Reading only Patient Visual Report: No change from baseline       Perception  Praxis      Pertinent Vitals/Pain Pain Assessment: No/denies pain     Hand Dominance     Extremity/Trunk Assessment Upper Extremity Assessment Upper Extremity Assessment: Generalized weakness   Lower Extremity Assessment Lower Extremity Assessment: Defer to PT evaluation   Cervical / Trunk Assessment Cervical / Trunk Assessment: Normal   Communication Communication Communication: No difficulties   Cognition Arousal/Alertness: Awake/alert Behavior During Therapy: WFL for tasks assessed/performed Overall Cognitive Status: Within Functional Limits for tasks assessed                                 General Comments:  patient is very pleasant and talkative, great at problem solving, good safety awareness   General Comments  Pt on 40L HFNC during session. Semi-reclined at start of session: SpO2 94%. Seated EOB x10 min: SpO2 85-91%. When pt attempted to boost herself up in bed, her oxygen dropped to 69%, HR 109. She reporting feeling dizzy. Instructed her in pursed lip breathing. After 4 min, SpO2 86%, HR 95. After 6 min, SpO2 90%, HR 94. Left semi-reclined in bed, SpO2 89-90%. RN notified.    Exercises Other Exercises Other Exercises: Pt educated re: the role of OT in acute care, pursed lip breathing, energy conservation strategies, self-management of symptoms and home/routine modifications. Other Exercises: sitting balance and activity tolerance at EOB x10 min   Shoulder Instructions      Home Living Family/patient expects to be discharged to:: Private residence   Available Help at Discharge: Friend(s);Neighbor;Personal care attendant;Available PRN/intermittently Type of Home: House Home Access: Stairs to enter [employer] of Steps: 2 Entrance Stairs-Rails: None Home Layout: One level     Bathroom Shower/Tub: Tub/shower unit;Walk-in shower   Bathroom Toilet: Standard     Home Equipment: Cane - single point;Walker - 4 wheels;Bedside commode;Hand held shower head;Tub bench   Additional Comments: Patient reports she has PCA 2 x per week, HHPT at home. Cleaning lady comes 1x/month. Pt has difficulties getting out of the tub, uses the shower instead, sits on shower seat.      Prior Functioning/Environment Level of Independence: Needs assistance  Gait / Transfers Assistance Needed: No falls. Used rollator to ambulate in the house. Unable to ambulate outside the home due to poor endurance and O2 needs. ADL's / Homemaking Assistance Needed: Mod I in dressing and toileting. Takes frequent rest breaks, wears night gowns, uses poise pads and has BSC next to bed for emergencies. PCA assists with  bathing. Neighbor brings dinner every night and mows her yard. Indep in med mgt.   Comments: 6L O2 at baseline. Her son lives in [place] and drives her to appointments. She cancels appts if he is unable to come.        OT Problem List: Decreased strength;Decreased activity tolerance;Impaired balance (sitting and/or standing);Decreased knowledge of use of DME or AE;Cardiopulmonary status limiting activity      OT Treatment/Interventions: Self-care/ADL training;Therapeutic exercise;Therapeutic activities;Energy conservation;DME and/or AE instruction;Patient/family education;Balance training    OT Goals(Current goals can be found in the care plan section) Acute Rehab OT Goals Patient Stated Goal: To get stronger and take care of her dog OT Goal Formulation: With patient Time For Goal Achievement: 04/21/20 Potential to Achieve Goals: Good ADL Goals Pt Will Perform Grooming: with supervision;sitting (sitting at least 10 min with SpO2 >89%) Pt Will Perform Lower Body Dressing: with supervision;sit to/from stand;with adaptive equipment (using LRAD; incorporating at least 3 ECS  strategies) Pt Will Transfer to Toilet: with supervision;bedside commode;stand pivot transfer (maintaining SpO2 >89% throughout) Additional ADL Goal #1: Pt will independently incorporate >3 learned energy conservation strategies during ADL with SpO2 >89% in order to maximize pt independence and safety  OT Frequency: Min 2X/week   Barriers to D/C:            Co-evaluation              AM-PAC OT "6 Clicks" Daily Activity     Outcome Measure Help from another person eating meals?: None Help from another person taking care of personal grooming?: None Help from another person toileting, which includes using toliet, bedpan, or urinal?: A Lot (for [facility]) Help from another person bathing (including washing, rinsing, drying)?: A Lot Help from another person to put on and taking off regular upper body clothing?: A  Little Help from another person to put on and taking off regular lower body clothing?: A Lot 6 Click Score: 17   End of Session Nurse Communication: Other (comment) (Pt oxygen levels)  Activity Tolerance: Patient tolerated treatment well;Other (comment) (Pt limited by oxygen levels) Patient left: in bed;with call bell/phone within reach;with bed alarm set  OT Visit Diagnosis: Other abnormalities of gait and mobility (R26.89);Muscle weakness (generalized) (M62.81)                Time: 3338-3291 OT Time Calculation (min): 40 min Charges:  OT General Charges $OT Visit: 1 Visit OT Evaluation $OT Eval Moderate Complexity: 1 Mod OT Treatments $Self Care/Home Management : 8-22 mins $Therapeutic Activity: 8-22 mins  Jerilynn Birkenhead, OTS 04/07/20, 4:04 PM

## 2020-04-07 NOTE — Care Management Important Message (Signed)
Important Message  Patient Details  Name: Carol Gay MRN: [id number] Date of Birth: 03-Jul-1946   Medicare Important Message Given:  Yes     Dannette Barbara 04/07/2020, 12:38 PM

## 2020-04-07 NOTE — Evaluation (Signed)
Physical Therapy Evaluation Patient Details Name: Carol Gay MRN: [id number] DOB: Jan 03, 1946 Today's Date: 04/07/2020   History of Present Illness  Pt admitted for COPD exacerbation with complaints of SOB symptoms. HIstory includes COPD on 6L of O2, HTN, HLD, asthma, COPD, and colon cancer.  Clinical Impression  Patient received in bed, agreeable to PT assessment. She is on HFNC with sats in low 90% at rest. Patient performed supine to sit with supervision. Sit to stand with min guard. She does well with mobility but limited by O2 saturations which dropped to mid 70% with minimal activity. She will continue to benefit from skilled PT to improve activity tolerance while here for hopeful return home at discharge.       Follow Up Recommendations Home health PT    Equipment Recommendations  None recommended by PT    Recommendations for Other Services       Precautions / Restrictions Precautions Precautions: Fall Restrictions Weight Bearing Restrictions: No      Mobility  Bed Mobility Overal bed mobility: Modified Independent             General bed mobility comments: use of bed rails  Transfers Overall transfer level: Needs assistance Equipment used: None Transfers: Sit to/from Stand Sit to Stand: Min guard         General transfer comment: patient able to stand at edge of bed x 2-3 x to position self in bed.  Ambulation/Gait   Gait Distance (Feet): 2 Feet Assistive device: None Gait Pattern/deviations: Step-to pattern Gait velocity: decr   General Gait Details: able to take 2 steps along edge of bed with UE assist on bed rail and min guard. Mobility limited by O2 saturations which dropped into mid 80%s with activity.  Stairs            Wheelchair Mobility    Modified Rankin (Stroke Patients Only)       Balance Overall balance assessment: Needs assistance Sitting-balance support: Feet supported Sitting balance-Leahy Scale: Good      Standing balance support: Single extremity supported;During functional activity Standing balance-Leahy Scale: Fair Standing balance comment: benefits from at least single UE support, and min guard for safety                             Pertinent Vitals/Pain Pain Assessment: No/denies pain    Home Living Family/patient expects to be discharged to:: Private residence Living Arrangements: Alone Available Help at Discharge: Friend(s);Neighbor;Personal care attendant;Available PRN/intermittently Type of Home: House Home Access: Stairs to enter Entrance Stairs-Rails: None Entrance Stairs-Number of Steps: 2 Home Layout: One level Home Equipment: Cane - single point;Walker - 4 wheels Additional Comments: Patient reports she has PCA 2 x per week, HHPT at home.    Prior Function Level of Independence: Independent with assistive device(s)         Comments: prior to admission, pt was indep with no falls. Limited endurance due to O2 demand. HOusehold ambulator. Unable to ambulate outside of home due to poor endurance     Hand Dominance        Extremity/Trunk Assessment   Upper Extremity Assessment Upper Extremity Assessment: Overall WFL for tasks assessed    Lower Extremity Assessment Lower Extremity Assessment: Overall WFL for tasks assessed    Cervical / Trunk Assessment Cervical / Trunk Assessment: Normal  Communication   Communication: No difficulties  Cognition Arousal/Alertness: Awake/alert Behavior During Therapy: WFL for tasks assessed/performed Overall Cognitive  Status: Within Functional Limits for tasks assessed                                 General Comments: patient is very pleasant and talkative      General Comments      Exercises     Assessment/Plan    PT Assessment Patient needs continued PT services  PT Problem List Decreased strength;Decreased mobility;Decreased activity tolerance;Cardiopulmonary status limiting  activity       PT Treatment Interventions      PT Goals (Current goals can be found in the Care Plan section)  Acute Rehab PT Goals Patient Stated Goal: to return home, get more assistance from PCA PT Goal Formulation: With patient Time For Goal Achievement: 04/14/20 Potential to Achieve Goals: Good    Frequency Min 2X/week   Barriers to discharge Decreased caregiver support      Co-evaluation               AM-PAC PT "6 Clicks" Mobility  Outcome Measure Help needed turning from your back to your side while in a flat bed without using bedrails?: None Help needed moving from lying on your back to sitting on the side of a flat bed without using bedrails?: A Little Help needed moving to and from a bed to a chair (including a wheelchair)?: A Little Help needed standing up from a chair using your arms (e.g., wheelchair or bedside chair)?: A Little Help needed to walk in hospital room?: A Little Help needed climbing 3-5 steps with a railing? : A Lot 6 Click Score: 18    End of Session Equipment Utilized During Treatment: Oxygen Activity Tolerance: Other (comment) (limited by O2 saturations) Patient left: in bed;with bed alarm set;with call bell/phone within reach Nurse Communication: Mobility status PT Visit Diagnosis: Muscle weakness (generalized) (M62.81);Other abnormalities of gait and mobility (R26.89)    Time: 1030-1100 PT Time Calculation (min) (ACUTE ONLY): 30 min   Charges:   PT Evaluation $PT Eval Moderate Complexity: 1 Mod PT Treatments $Therapeutic Activity: 8-22 mins        Talyia Allende, PT, GCS 04/07/20,11:13 AM

## 2020-04-07 NOTE — Progress Notes (Signed)
Verified oxygen sat requirement with Dr. Jimmye Norman. Per MD, keep oxygen saturation >88%.

## 2020-04-07 NOTE — Evaluation (Signed)
Clinical/Bedside Swallow Evaluation Patient Details  Name: Carol Gay MRN: [id number] Date of Birth: 07-23-1946  Today's Date: 04/07/2020 Time: SLP Start Time (ACUTE ONLY): 0909 SLP Stop Time (ACUTE ONLY): 0947 SLP Time Calculation (min) (ACUTE ONLY): 38 min  Past Medical History:  Past Medical History:  Diagnosis Date  . Aortic atherosclerosis ([place])   . Arm pain   . Asthma   . Benign neoplasm of rectosigmoid junction   . Benign neoplasm of transverse colon   . CAD in native artery   . Cancer ([place]) 09/2015   rectum  . Cigarette smoker   . Claustrophobia   . Colon cancer ([place])   . 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 COPD (chronic obstructive pulmonary disease) ([place])   . DDD (degenerative disc disease), lumbar       . Dermatophytoses   . Dyslipidemia   . H/O blood clots    left leg, veins stripped  . Hemorrhoids   . Hypertension   . Last menstrual period (LMP) > 10 days ago 1994  . Myocardial infarction [facility])    "mild" - age 36  . Personal history of chemotherapy   . Personal history of radiation therapy   . Pulmonary emphysema ([place])   . Rectal bleeding   . Shortness of breath dyspnea   . Smokers' cough ([place])   . Spinal headache    with one of my children's birth  . Vertigo   . Wears dentures    has full upper and lower - doesn't wear   Past Surgical History:  Past Surgical History:  Procedure Laterality Date  . APPENDECTOMY    . BREAST BIOPSY Left 1970's   neg  . COLONOSCOPY WITH PROPOFOL N/A 11/06/2015   Procedure: COLONOSCOPY WITH PROPOFOL;  Surgeon: Lucilla Lame, MD;  Location: [place];  Service: Endoscopy;  Laterality: N/A;  LEAVE PT EARLY  . EXPLORATORY LAPAROTOMY    . LESION EXCISION  11/03/15   Anal - Dr Dahlia Byes, [facility]  . LESION REMOVAL  1970's   from tailbone  . [place] SURGERY  1997  . OVARIAN CYST REMOVAL Left   . POLYPECTOMY  11/06/2015   Procedure: POLYPECTOMY;  Surgeon: Lucilla Lame, MD;   Location: [place];  Service: Endoscopy;;  . PORTACATH PLACEMENT N/A 12/10/2015   Procedure: INSERTION PORT-A-CATH;  Surgeon: Jules Husbands, MD;  Location: [facility];  Service: General;  Laterality: N/A;  . RECTAL EXAM UNDER ANESTHESIA N/A 06/23/2016   Procedure: RECTAL EXAM UNDER ANESTHESIA;  Surgeon: Jules Husbands, MD;  Location: [facility];  Service: General;  Laterality: N/A;  . VEIN LIGATION AND STRIPPING     HPI:  Pt admitted for COPD exacerbation with complaints of SOB symptoms. HIstory includes COPD on 6L of O2, HTN, HLD, asthma, COPD, and colon cancer.   Assessment / Plan / Recommendation Clinical Impression  Pt presents with adequate oropharyngeal abilities and no overt s/s of dysphagia or aspiration when consuming regular diet with thin liquids. Pt currently on HFNC and maintained stable vitals with O2 at 90% while consuming thin liquids via straw with challenging consecutive sips and medicine whole. Pt with effective mastication of regular food textures. Pt talkative throughout without any respiratory decline. While silent aspiration cannot be ruled out at bedside, recommend current diet. ST to sign off at this time. Please re-consult if pt conditions changes.  SLP Visit Diagnosis: Dysphagia, unspecified (R13.10)    Aspiration Risk  Mild aspiration risk    Diet Recommendation   Age appropriate regular textures, thin liquids  Medication Administration: Whole meds with liquid    Other  Recommendations Oral Care Recommendations: Oral care BID   Follow up Recommendations None      Frequency and Duration   N/A         Prognosis   N/A     Swallow Study   General Date of Onset: 04/03/20 HPI: Pt admitted for COPD exacerbation with complaints of SOB symptoms. HIstory includes COPD on 6L of O2, HTN, HLD, asthma, COPD, and colon cancer. Type of Study: Bedside Swallow Evaluation Previous Swallow Assessment: none in chart Diet Prior to this Study: Regular;Thin  liquids Temperature Spikes Noted: No Respiratory Status:  (HFNC) History of Recent Intubation: No Behavior/Cognition: Alert;Cooperative;Pleasant mood Oral Cavity Assessment: Within Functional Limits Oral Care Completed by SLP: Recent completion by staff Oral Cavity - Dentition: Edentulous Vision: Functional for self-feeding Self-Feeding Abilities: Able to feed self Patient Positioning: Upright in bed Baseline Vocal Quality: Normal Volitional Cough: Strong Volitional Swallow: Able to elicit    Oral/Motor/Sensory Function Overall Oral Motor/Sensory Function: Within functional limits   Ice Chips Ice chips: Not tested   Thin Liquid Thin Liquid: Within functional limits Presentation: Self Fed;Straw    Nectar Thick Nectar Thick Liquid: Not tested   Honey Thick Honey Thick Liquid: Not tested   Puree Puree: Within functional limits Presentation: Self Fed;Spoon   Solid     Solid: Within functional limits Presentation: Self Fed     Rishan Oyama B. Rutherford Nail M.S., [place], [place] Pathologist Rehabilitation Services Office [phone]  Devarius Nelles Rutherford Nail 04/07/2020,12:43 PM

## 2020-04-08 DIAGNOSIS — R918 Other nonspecific abnormal finding of lung field: Secondary | ICD-10-CM

## 2020-04-08 LAB — BASIC METABOLIC PANEL
Anion gap: 8 (ref 5–15)
BUN: 34 mg/dL — ABNORMAL HIGH (ref 8–23)
CO2: 29 mmol/L (ref 22–32)
Calcium: 9.8 mg/dL (ref 8.9–10.3)
Chloride: 99 mmol/L (ref 98–111)
Creatinine, Ser: 0.75 mg/dL (ref 0.44–1.00)
GFR calc Af Amer: >60 mL/min (ref >60)
GFR calc non Af Amer: >60 mL/min (ref >60)
Glucose, Bld: 120 mg/dL — ABNORMAL HIGH (ref 70–99)
Potassium: 4.1 mmol/L (ref 3.5–5.1)
Sodium: 136 mmol/L (ref 135–145)

## 2020-04-08 LAB — CBC
HCT: 43.2 % (ref 36.0–46.0)
Hemoglobin: 14.1 g/dL (ref 12.0–15.0)
MCH: 29.9 pg (ref 26.0–34.0)
MCHC: 32.6 g/dL (ref 30.0–36.0)
MCV: 91.5 fL (ref 80.0–100.0)
Platelets: 234 10*3/uL (ref 150–400)
RBC: 4.72 MIL/uL (ref 3.87–5.11)
RDW: 14.1 % (ref 11.5–15.5)
WBC: 6.6 10*3/uL (ref 4.0–10.5)
nRBC: 0 % (ref 0.0–0.2)

## 2020-04-08 LAB — CULTURE, BLOOD (ROUTINE X 2): Culture: NO GROWTH

## 2020-04-08 LAB — CULTURE, RESPIRATORY W GRAM STAIN: Culture: NORMAL

## 2020-04-08 MED ORDER — MAGNESIUM SULFATE 2 GM/50ML IV SOLN
2.0000 g | Freq: Once | INTRAVENOUS | Status: AC
  Administered 2020-04-08: 2 g via INTRAVENOUS
  Filled 2020-04-08: qty 50

## 2020-04-08 MED ORDER — IPRATROPIUM-ALBUTEROL 0.5-2.5 (3) MG/3ML IN SOLN
3.0000 mL | Freq: Three times a day (TID) | RESPIRATORY_TRACT | Status: DC
  Administered 2020-04-08 – 2020-04-13 (×15): 3 mL via RESPIRATORY_TRACT
  Filled 2020-04-08 (×16): qty 3

## 2020-04-08 MED ORDER — POTASSIUM CHLORIDE CRYS ER 20 MEQ PO TBCR
30.0000 meq | EXTENDED_RELEASE_TABLET | Freq: Two times a day (BID) | ORAL | Status: DC
  Administered 2020-04-08 (×2): 30 meq via ORAL
  Filled 2020-04-08 (×2): qty 1

## 2020-04-08 MED ORDER — FUROSEMIDE 10 MG/ML IJ SOLN
40.0000 mg | Freq: Every day | INTRAMUSCULAR | Status: DC
  Administered 2020-04-08 – 2020-04-09 (×2): 40 mg via INTRAVENOUS
  Filled 2020-04-08 (×2): qty 4

## 2020-04-08 NOTE — Progress Notes (Signed)
Pulmonary Medicine          Date: 04/08/2020,   MRN# [id number] Carol Gay 05-03-1946     AdmissionWeight: 77.3 kg                 CurrentWeight: 78 kg   Referring physician: Dr Jimmye Norman    CHIEF COMPLAINT:   Acute on chronic hypoxemic respiratory failure   SUBJECTIVE   Patient remains on HFNC 50/40.  She had PT today was not able to do much, was desaturationg to 70s with few steps.    Plan for CT chest today due to significant progressive hypoxemia, will evaluate lung parenchyma for any potential neoplasm related lesions as well.   PAST MEDICAL HISTORY   Past Medical History:  Diagnosis Date  . Aortic atherosclerosis ([place])   . Arm pain   . Asthma   . Benign neoplasm of rectosigmoid junction   . Benign neoplasm of transverse colon   . CAD in native artery   . Cancer ([place]) 09/2015   rectum  . Cigarette smoker   . Claustrophobia   . Colon cancer ([place])   . 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 COPD (chronic obstructive pulmonary disease) ([place])   . DDD (degenerative disc disease), lumbar       . Dermatophytoses   . Dyslipidemia   . H/O blood clots    left leg, veins stripped  . Hemorrhoids   . Hypertension   . Last menstrual period (LMP) > 10 days ago 1994  . Myocardial infarction [facility])    "mild" - age 74  . Personal history of chemotherapy   . Personal history of radiation therapy   . Pulmonary emphysema ([place])   . Rectal bleeding   . Shortness of breath dyspnea   . Smokers' cough ([place])   . Spinal headache    with one of my children's birth  . Vertigo   . Wears dentures    has full upper and lower - doesn't wear     SURGICAL HISTORY   Past Surgical History:  Procedure Laterality Date  . APPENDECTOMY    . BREAST BIOPSY Left 1970's   neg  . COLONOSCOPY WITH PROPOFOL N/A 11/06/2015   Procedure: COLONOSCOPY WITH PROPOFOL;  Surgeon: Lucilla Lame, MD;  Location: [facility];  Service: Endoscopy;   Laterality: N/A;  LEAVE PT EARLY  . EXPLORATORY LAPAROTOMY    . LESION EXCISION  11/03/15   Anal - Dr Dahlia Byes, [facility]  . LESION REMOVAL  1970's   from tailbone  . [place] SURGERY  1997  . OVARIAN CYST REMOVAL Left   . POLYPECTOMY  11/06/2015   Procedure: POLYPECTOMY;  Surgeon: Lucilla Lame, MD;  Location: [place];  Service: Endoscopy;;  . PORTACATH PLACEMENT N/A 12/10/2015   Procedure: INSERTION PORT-A-CATH;  Surgeon: Jules Husbands, MD;  Location: [facility];  Service: General;  Laterality: N/A;  . RECTAL EXAM UNDER ANESTHESIA N/A 06/23/2016   Procedure: RECTAL EXAM UNDER ANESTHESIA;  Surgeon: Jules Husbands, MD;  Location: [facility];  Service: General;  Laterality: N/A;  . VEIN LIGATION AND STRIPPING       FAMILY HISTORY   Family History  Problem Relation Age of Onset  . Cancer Mother        lymphoma  . Heart disease Mother   . Cancer Maternal Grandmother        breast  . Birth defects  Maternal Grandmother      SOCIAL HISTORY   Social History   Tobacco Use  . Smoking status: Former Smoker    Packs/day: 1.00    Years: 57.00    Pack years: 57.00    Types: Cigarettes    Quit date: 03/30/2016    Years since quitting: 4.0  . Smokeless tobacco: Never Used  . Tobacco comment: Quit in July 2017  Vaping Use  . Vaping Use: Never used  Substance Use Topics  . Alcohol use: No    Alcohol/week: 0.0 standard drinks  . Drug use: No     MEDICATIONS    Home Medication:    Current Medication:  Current Facility-Administered Medications:  .  0.9 %  sodium chloride infusion, , Intravenous, PRN, Wyvonnia Dusky, MD, Stopped at 04/07/20 1535 .  acetaminophen (TYLENOL) tablet 650 mg, 650 mg, Oral, Q6H PRN, 650 mg at 04/07/20 0920 **OR** acetaminophen (TYLENOL) suppository 650 mg, 650 mg, Rectal, Q6H PRN, Mansy, Jan A, MD .  amLODipine (NORVASC) tablet 5 mg, 5 mg, Oral, Daily, Mansy, Jan A, MD, 5 mg at 04/08/20 0949 .  atorvastatin (LIPITOR) tablet 20 mg, 20 mg,  Oral, QHS, Mansy, Jan A, MD, 20 mg at 04/07/20 2224 .  calcium carbonate (TUMS - dosed in mg elemental calcium) chewable tablet 200 mg of elemental calcium, 1 tablet, Oral, TID PRN, Mansy, Jan A, MD, 200 mg of elemental calcium at 04/08/20 1239 .  Chlorhexidine Gluconate Cloth 2 % PADS 6 each, 6 each, Topical, Daily, Wyvonnia Dusky, MD, 6 each at 04/08/20 [phone] .  cyclobenzaprine (FLEXERIL) tablet 10 mg, 10 mg, Oral, BID, Mansy, Jan A, MD, 10 mg at 04/08/20 0949 .  enoxaparin (LOVENOX) injection 40 mg, 40 mg, Subcutaneous, Q24H, Mansy, Jan A, MD, 40 mg at 04/07/20 2003 .  guaiFENesin (MUCINEX) 12 hr tablet 600 mg, 600 mg, Oral, BID, Mansy, Jan A, MD, 600 mg at 04/08/20 0949 .  guaiFENesin-dextromethorphan (ROBITUSSIN DM) 100-10 MG/5ML syrup 5 mL, 5 mL, Oral, Q4H PRN, Mansy, Jan A, MD .  hydrOXYzine (ATARAX/VISTARIL) tablet 25 mg, 25 mg, Oral, BID PRN, Mansy, Jan A, MD .  ipratropium-albuterol (DUONEB) 0.5-2.5 (3) MG/3ML nebulizer solution 3 mL, 3 mL, Nebulization, TID, Wyvonnia Dusky, MD, 3 mL at 04/08/20 1339 .  labetalol (NORMODYNE) injection 20 mg, 20 mg, Intravenous, Q3H PRN, Mansy, Jan A, MD .  levalbuterol Penne Lash) nebulizer solution 0.63 mg, 0.63 mg, Nebulization, Q6H PRN, Mansy, Jan A, MD, 0.63 mg at 04/05/20 1411 .  magnesium hydroxide (MILK OF MAGNESIA) suspension 30 mL, 30 mL, Oral, Daily PRN, Mansy, Jan A, MD .  methylPREDNISolone sodium succinate (SOLU-MEDROL) 40 mg/mL injection 40 mg, 40 mg, Intravenous, Q12H, Lanney Gins, Whitnie Deleon, MD, 40 mg at 04/08/20 1413 .  ondansetron (ZOFRAN) tablet 4 mg, 4 mg, Oral, Q6H PRN **OR** ondansetron (ZOFRAN) injection 4 mg, 4 mg, Intravenous, Q6H PRN, Mansy, Jan A, MD, 4 mg at 04/06/20 2131 .  traZODone (DESYREL) tablet 25 mg, 25 mg, Oral, QHS PRN, Mansy, Jan A, MD, 25 mg at 04/07/20 2224  Facility-Administered Medications Ordered in Other Encounters:  .  albuterol (PROVENTIL) (2.5 MG/3ML) 0.083% nebulizer solution 2.5 mg, 2.5 mg, Nebulization,  Once, Laverle Hobby, MD    ALLERGIES   Bee venom, Aspirin, Codeine, Morphine and related, and Cat hair extract     REVIEW OF SYSTEMS    Review of Systems:  Gen:  Denies  fever, sweats, chills weigh loss  HEENT: Denies blurred vision, double vision, ear  pain, eye pain, hearing loss, nose bleeds, sore throat Cardiac:  No dizziness, chest pain or heaviness, chest tightness,edema Resp: reports cough or sputum porduction, shortness of breath,wheezing, hemoptysis,  Gi: Denies swallowing difficulty, stomach pain, nausea or vomiting, diarrhea, constipation, bowel incontinence Gu:  Denies bladder incontinence, burning urine Ext:   Denies Joint pain, stiffness or swelling Skin: Denies  skin rash, easy bruising or bleeding or hives Endoc:  Denies polyuria, polydipsia , polyphagia or weight change Psych:   Denies depression, insomnia or hallucinations   Other:  All other systems negative   VS: BP (!) 125/58 (BP Location: Right Arm)   Pulse 91   Temp 97.9 F (36.6 C) (Oral)   Resp 20   Ht 5\' 2"  (1.575 m)   Wt 78 kg   SpO2 95%   BMI 31.46 kg/m      PHYSICAL EXAM    GENERAL:NAD, no fevers, chills, no weakness no fatigue HEAD: Normocephalic, atraumatic.  EYES: Pupils equal, round, reactive to light. Extraocular muscles intact. No scleral icterus.  MOUTH: Moist mucosal membrane. Dentition intact. No abscess noted.  EAR, NOSE, THROAT: Clear without exudates. No external lesions.  NECK: Supple. No thyromegaly. No nodules. No JVD.  PULMONARY: Diffuse coarse rhonchi right sided +wheezes CARDIOVASCULAR: S1 and S2. Regular rate and rhythm. No murmurs, rubs, or gallops. No edema. Pedal pulses 2+ bilaterally.  GASTROINTESTINAL: Soft, nontender, nondistended. No masses. Positive bowel sounds. No hepatosplenomegaly.  MUSCULOSKELETAL: No swelling, clubbing, or edema. Range of motion full in all extremities.  NEUROLOGIC: Cranial nerves II through XII are intact. No gross focal  neurological deficits. Sensation intact. Reflexes intact.  SKIN: No ulceration, lesions, rashes, or cyanosis. Skin warm and dry. Turgor intact.  PSYCHIATRIC: Mood, affect within normal limits. The patient is awake, alert and oriented x 3. Insight, judgment intact.       IMAGING    DG Chest 1 View  Result Date: 04/03/2020 CLINICAL DATA:  Shortness of breath EXAM: CHEST  1 VIEW COMPARISON:  01/28/2020 FINDINGS: Right-sided chest port in stable positioning. Heart size is normal. Atherosclerotic calcification of the aortic knob. Interval development of patchy and interstitial opacities within the mid to lower aspect of the right lung. Left lung is hyperexpanded and clear. Scattered granulomas are unchanged. No pleural effusion or pneumothorax identified. IMPRESSION: Interval development of patchy and interstitial opacities within the mid to lower aspect of the right lung. Findings suspicious for pneumonia. Radiographic follow-up to resolution is recommended. Electronically Signed   By: Davina Poke D.O.   On: 04/03/2020 16:24   CT CHEST W CONTRAST  Result Date: 04/07/2020 CLINICAL DATA:  Progressive hypoxemia EXAM: CT CHEST WITH CONTRAST TECHNIQUE: Multidetector CT imaging of the chest was performed during intravenous contrast administration. CONTRAST:  4mL OMNIPAQUE IOHEXOL 300 MG/ML  SOLN COMPARISON:  CT 07/02/2016, radiograph 04/05/2020 FINDINGS: Cardiovascular: Cardiac size at the upper limits of normal. Trace pericardial thickening similar to priors. Coronary artery calcifications are present. Dense calcifications present at the aortic root and throughout the thoracic aorta. Aorta is normal caliber. No acute luminal abnormality nor periaortic stranding or hemorrhage. Normal 3 vessel branching of the aortic arch. Extensive calcification of the proximal great vessels. Central pulmonary arteries are borderline enlarged. No large central or lobar filling defects are seen on this limited, non  tailored examination of the pulmonary arteries. Right IJ approach Port-A-Cath tip terminates at the lower SVC. Mediastinum/Nodes: Mediastinal and hilar adenopathy: Index nodes include pretracheal lymph node measuring up to 2.5 cm in size (2/48),  13 mm right hilar lymph node (2/74), 10 mm subcarinal lymph node (2/69). No acute abnormality of the trachea or esophagus. Slight rightward mediastinal shift likely related to volume loss in the right hemithorax. Thyroid gland and thoracic inlet are unremarkable. Lungs/Pleura: Extensive centrilobular and paraseptal emphysematous changes throughout the lungs. Chronic areas of subpleural reticulation and fibrotic change most pronounced in the right hemithorax. There is an irregular, spiculated nodule with some central cavitation measuring up to 2 x 2 x 1.3 cm in size (2/63). A more dense area of consolidated lung and volume loss with underlying heterogeneous enhancement is noted in the right middle lobe along the paramediastinal border with some associated air bronchograms more centrally. No other concerning pulmonary nodules or masses. Scattered calcified granulomata are noted throughout both lungs. Chronic area of scarring in the left lung base with some likely adjacent subsegmental atelectasis. Upper Abdomen: Right renal scarring. No acute abnormalities present in the visualized portions of the upper abdomen. Musculoskeletal: No acute osseous abnormality or suspicious osseous lesion. Degenerative changes in the spine and shoulders. No suspicious chest wall lesions. IMPRESSION: 1. Irregular, spiculated nodule with some central cavitation in the right upper lobe measuring up to 2 cm in size, concerning for primary bronchogenic malignancy or less likely metastasis. Cavitary pneumonia is less favored given the additional findings in the chest. 2. More dense area of consolidated lung and volume loss with underlying heterogeneous enhancement in the right middle lobe along the  paramediastinal border with some associated air bronchograms more centrally. Certainly could reflect an acute consolidative airspace process/pneumonia though underlying mass lesion is not excluded. 3. Mediastinal and hilar adenopathy, concerning for metastatic disease. 4. Borderline enlarged central pulmonary arteries, which can be seen with pulmonary arterial hypertension. 5. Aortic Atherosclerosis (ICD10-I70.0) 6. Emphysema (ICD10-J43.9). These results will be called to the ordering clinician or representative by the Radiologist Assistant, and communication documented in the PACS or [employer]. Elevated Electronically Signed   By: Lovena Le M.D.   On: 04/07/2020 19:37   DG Chest Port 1 View  Result Date: 04/05/2020 CLINICAL DATA:  74 year old female with a history of shortness of breath EXAM: PORTABLE CHEST 1 VIEW COMPARISON:  04/03/2020 FINDINGS: Cardiomediastinal silhouette unchanged. Reticulonodular opacities of the right greater than left lung, unchanged from the comparison plain film. No pneumothorax. No pleural effusion. Unchanged right IJ port catheter. IMPRESSION: Similar appearance of the chest x-ray with right greater than left reticulonodular opacities, potentially multifocal infection. Unchanged right IJ port catheter. Electronically Signed   By: Corrie Mckusick D.O.   On: 04/05/2020 15:27   ECHOCARDIOGRAM COMPLETE  Result Date: 04/05/2020    ECHOCARDIOGRAM REPORT   Patient Name:   Carol Gay Date of Exam: 04/05/2020 Medical Rec #:  [id number]        Height:       62.0 in Accession #:    [id number]       Weight:       173.7 lb Date of Birth:  Jan 16, 1946        BSA:          1.801 m Patient Age:    23 years         BP:           143/68 mmHg Patient Gender: F                HR:           100 bpm. Exam Location:  [facility] Procedure: 2D Echo Indications:  Bacteremia  History:         Patient has no prior history of Echocardiogram examinations.                  Advanced Stage COPD; Risk  Factors:Current Smoker and                  Hypertension.  Sonographer:     L Thornton-Maynard Referring Phys:  [id number] Wyvonnia Dusky Diagnosing Phys: Ida Rogue MD  Sonographer Comments: Image acquisition challenging due to COPD. IMPRESSIONS  1. Left ventricular ejection fraction, by estimation, is 55 to 60%. The left ventricle has normal function. The left ventricle has no regional wall motion abnormalities. There is mild left ventricular hypertrophy. Left ventricular diastolic parameters are consistent with Grade I diastolic dysfunction (impaired relaxation).  2. Right ventricular systolic function is mildly reduced. The right ventricular size is moderately enlarged. There is severely elevated pulmonary artery systolic pressure. The estimated right ventricular systolic pressure is 32.9 mmHg.  3. No valve vegetation noted. FINDINGS  Left Ventricle: Left ventricular ejection fraction, by estimation, is 55 to 60%. The left ventricle has normal function. The left ventricle has no regional wall motion abnormalities. The left ventricular internal cavity size was normal in size. There is  mild left ventricular hypertrophy. Left ventricular diastolic parameters are consistent with Grade I diastolic dysfunction (impaired relaxation). Right Ventricle: The right ventricular size is moderately enlarged. No increase in right ventricular wall thickness. Right ventricular systolic function is mildly reduced. There is severely elevated pulmonary artery systolic pressure. The tricuspid regurgitant velocity is 4.48 m/s, and with an assumed right atrial pressure of 10 mmHg, the estimated right ventricular systolic pressure is 92.4 mmHg. Left Atrium: Left atrial size was normal in size. Right Atrium: Right atrial size was normal in size. Pericardium: There is no evidence of pericardial effusion. Mitral Valve: The mitral valve is normal in structure. Normal mobility of the mitral valve leaflets. Mild mitral valve  regurgitation. No evidence of mitral valve stenosis. MV peak gradient, 5.4 mmHg. The mean mitral valve gradient is 3.0 mmHg. Tricuspid Valve: The tricuspid valve is normal in structure. Tricuspid valve regurgitation is mild . No evidence of tricuspid stenosis. Aortic Valve: The aortic valve was not well visualized. Aortic valve regurgitation is not visualized. No aortic stenosis is present. Aortic valve mean gradient measures 5.0 mmHg. Aortic valve peak gradient measures 7.0 mmHg. Aortic valve area, by VTI measures 1.31 cm. Pulmonic Valve: The pulmonic valve was normal in structure. Pulmonic valve regurgitation is not visualized. No evidence of pulmonic stenosis. Aorta: The aortic root is normal in size and structure. Venous: The inferior vena cava is normal in size with less than 50% respiratory variability, suggesting right atrial pressure of 8 mmHg. IAS/Shunts: No atrial level shunt detected by color flow Doppler.  LEFT VENTRICLE PLAX 2D LVIDd:         3.98 cm  Diastology LVIDs:         2.65 cm  LV e' lateral:   6.31 cm/s LV PW:         1.61 cm  LV E/e' lateral: 12.4 LV IVS:        1.55 cm  LV e' medial:    5.33 cm/s LVOT diam:     1.70 cm  LV E/e' medial:  14.7 LV SV:         38 LV SV Index:   21 LVOT Area:     2.27 cm  RIGHT VENTRICLE RV  S prime:     14.60 cm/s TAPSE (M-mode): 1.8 cm LEFT ATRIUM         Index LA diam:    3.50 cm 1.94 cm/m  AORTIC VALVE AV Area (Vmax):    1.47 cm AV Area (Vmean):   1.25 cm AV Area (VTI):     1.31 cm AV Vmax:           132.00 cm/s AV Vmean:          104.000 cm/s AV VTI:            0.292 m AV Peak Grad:      7.0 mmHg AV Mean Grad:      5.0 mmHg LVOT Vmax:         85.50 cm/s LVOT Vmean:        57.500 cm/s LVOT VTI:          0.168 m LVOT/AV VTI ratio: 0.58 MITRAL VALVE                TRICUSPID VALVE MV Area (PHT): 5.00 cm     TR Peak grad:   80.3 mmHg MV Peak grad:  5.4 mmHg     TR Vmax:        448.00 cm/s MV Mean grad:  3.0 mmHg MV Vmax:       1.16 m/s     SHUNTS MV Vmean:       83.0 cm/s    Systemic VTI:  0.17 m MV E velocity: 78.40 cm/s   Systemic Diam: 1.70 cm MV A velocity: 117.00 cm/s MV E/A ratio:  0.67 Ida Rogue MD Electronically signed by Ida Rogue MD Signature Date/Time: 04/05/2020/11:07:53 PM    Final              Media Information   Document Information  Photos    04/08/2020 17:22  Attached To:  Hospital Encounter on 04/03/20  Source Information  Ottie Glazier, MD  [facility]       ASSESSMENT/PLAN   Acute on chronic hypoxemic respiratory failure  - due to right lung community acquired pneumonia vs AECOPD due to malingancy  -bacterial/viral etiology workup is negative  -respiratory culture via expectorated sputum negative -Repeat blood culturues - 1 + staph capitis likely contaminant -hx of squamous stage 3 rectal CA- s/p chemoradiotherapy -RVP-negative -MRSA nasal -negative -Procalcitonin trend -Legionella and strep pneumo negative-Bnegative  -CXR reviewed as above with RLL infiltrate -Swallow evaluation to r/o recurrent aspiration due to RLL inifiltrate - narrow Antimicrobial regimen post above workup  I have discussed CT chest findings with patient at length , she will need additional workup including MRI brain, outpatient PET.  Plan is to continue to improve her respiratory status so she can go home and complete cancer workup.  Will stop Ancef today  Community acquired pneumonia  - present on admission   - etiology not clear at this time - possible viral/bacterial vs aspiration related as above  - Vanc/Cefepime >>Zithromax/Ancef  -Solumedrol   - s/p IVF  -repeat blood cx -noted legionella/strep pneumo -RVP - repeat CXR in am  Reviewed ABG with hypoxemia without hypercapnia   Severe acute exacerbation of COPD  - agree with current COPD carepath  - continue to wean steroids  - IS - I have encouraged patient to keep working with spirometer - currently tV at 850cc per breath  - c/w  antibiotics  - DuoNEb therapy per RT   Chronic hypoxemia   - PT/OT and consider  palliative care    - monitor diuresis-stopping lasix 7/19   - metaneb for recruitment maneuvers       Thank you for allowing me to participate in the care of this patient.   Patient/Family are satisfied with care plan and all questions have been answered.   This document was prepared using Dragon voice recognition software and may include unintentional dictation errors.     Ottie Glazier, M.D.  Division of [place]

## 2020-04-08 NOTE — Progress Notes (Signed)
Physical Therapy Treatment Patient Details Name: Carol Gay MRN: [id number] DOB: Feb 21, 1946 Today's Date: 04/08/2020    History of Present Illness Pt admitted for COPD exacerbation with complaints of SOB symptoms. History includes COPD on 6L of O2, HTN, HLD, asthma, COPD, and colon cancer.    PT Comments    PT treatment completed. Patient is pleasant and cooperative. Noted oxygen has been decreased to 14L02, down from previous session. Patient is agreeable to participate with in bed strengthening therapeutic exercises and required assistance with end range of motion and cues for technique. Patient is mildly short of breath with therapeutic exercise with Sp02 decreasing briefly to 89%. Cues for breathing provided. Encouraged patient to routinely reposition in bed to prevent against skin breakdown and to use bed side commode with staff assistance as able to encourage OOB activity and upright conditioning. Patient is still hopeful to be discharge home. Recommend continue PT efforts to maximize function and safety to facilitate independence.    Follow Up Recommendations  Home health PT     Equipment Recommendations  None recommended by PT    Recommendations for Other Services       Precautions / Restrictions Precautions Precautions: Fall Restrictions Weight Bearing Restrictions: No    Mobility  Bed Mobility               General bed mobility comments: Patient independently repositioning in bed. Patient declined attempting to sit up or get OOB due to earlier episode of feeling she would pass out with Sp02 in the 70's with rolling in bed. Educated on importance and need for routine repositioning in bed to prevent against skin breakdown   Transfers                    Ambulation/Gait                 Stairs             Wheelchair Mobility    Modified Rankin (Stroke Patients Only)       Balance                                             Cognition Arousal/Alertness: Awake/alert Behavior During Therapy: WFL for tasks assessed/performed Overall Cognitive Status: Within Functional Limits for tasks assessed                                        Exercises General Exercises - Lower Extremity Ankle Circles/Pumps: AROM;Strengthening;Both;10 reps;Supine Short Arc Quad: AROM;Strengthening;Both;10 reps;Supine Heel Slides: Strengthening;Both;10 reps;Supine;AAROM Hip ABduction/ADduction: AAROM;Strengthening;Both;10 reps;Supine Straight Leg Raises: AAROM;Strengthening;Both;10 reps;Supine Other Exercises Other Exercises: Exercises performed x 2 sets of each with A.AROM at times to achieve end range of motion. verbal cues for technique. Sp02 decreased to 89% briefly during session on 14 L02.     General Comments        Pertinent Vitals/Pain Pain Assessment: No/denies pain    Home Living                      Prior Function            PT Goals (current goals can now be found in the care plan section) Acute Rehab PT Goals Patient Stated Goal: to return home and  care for her dog  PT Goal Formulation: With patient Time For Goal Achievement: 04/14/20 Potential to Achieve Goals: Good Progress towards PT goals: Progressing toward goals    Frequency    Min 2X/week      PT Plan Current plan remains appropriate    Co-evaluation              AM-PAC PT "6 Clicks" Mobility   Outcome Measure  Help needed turning from your back to your side while in a flat bed without using bedrails?: None Help needed moving from lying on your back to sitting on the side of a flat bed without using bedrails?: A Little Help needed moving to and from a bed to a chair (including a wheelchair)?: A Little Help needed standing up from a chair using your arms (e.g., wheelchair or bedside chair)?: A Little Help needed to walk in hospital room?: A Little Help needed climbing 3-5 steps with a railing? :  A Lot 6 Click Score: 18    End of Session Equipment Utilized During Treatment: Oxygen Activity Tolerance: Patient limited by fatigue Patient left: in bed;with bed alarm set;with call bell/phone within reach Nurse Communication: Other (comment) (white board appropriate for mobility status ) PT Visit Diagnosis: Muscle weakness (generalized) (M62.81);Other abnormalities of gait and mobility (R26.89)     Time: 4854-6270 PT Time Calculation (min) (ACUTE ONLY): 26 min  Charges:  $Therapeutic Exercise: 23-37 mins                     Minna Merritts, PT, MPT    Percell Locus 04/08/2020, 3:37 PM

## 2020-04-08 NOTE — Progress Notes (Signed)
Occupational Therapy Treatment Patient Details Name: Carol Gay MRN: [id number] DOB: Dec 06, 1945 Today's Date: 04/08/2020    History of present illness Pt admitted for COPD exacerbation with complaints of SOB symptoms. History includes COPD on 6L of O2, HTN, HLD, asthma, COPD, and colon cancer.   OT comments  Pt presents this afternoon, pleasant and agreeable to treatment, on 14L O2 with SpO2 97%. She had already bathing and groomed prior to session. Pt sat up to EOB with MOD I and maintained sitting x12 min to improve endurance and activity tolerance in preparation for ADL participation. Her SpO2 was 83-86% when talking, but improved to 86-90% with intermittent pursed lip breathing and verbal rest breaks. Reviewed education re: preferred DME use for ADL participation and safety at home, energy conservation strategies and plan of care. Pt became fatigued and upon return to lying in bed, SpO2 decreased to 75% with signs of SOB. Quickly rebounded within two minutes to >88% with pursed lip breathing. Pt is showing improvements in activity tolerance and progressing towards goals. Will continue to benefit from skilled acute OT services and discharge plan remains appropriate.       Follow Up Recommendations  Home health OT;Supervision - Intermittent    Equipment Recommendations  Tub/shower seat    Recommendations for Other Services      Precautions / Restrictions Precautions Precautions: Fall Restrictions Weight Bearing Restrictions: No Other Position/Activity Restrictions: Monitor O2 throughout       Mobility Bed Mobility Overal bed mobility: Modified Independent Bed Mobility: Sidelying to Sit;Sit to Sidelying   Sidelying to sit: Modified independent (Device/Increase time)     Sit to sidelying: Modified independent (Device/Increase time) General bed mobility comments: Pt sat up to EOB with ease this date, using bed rails for assistance. When returning to supine, her SpO2  decreased to 75% (possible inaccuracy due to movement), however pt also showing signs of SOB. Quickly rebounded with pursed lip breathing.  Transfers                 General transfer comment: deferred this date 2/2 pt O2 levels    Balance Overall balance assessment: Needs assistance Sitting-balance support: Feet unsupported;No upper extremity supported Sitting balance-Leahy Scale: Good Sitting balance - Comments: intermittent RUE use for support, good dynamic sitting balance       Standing balance comment: deferred this date                           ADL either performed or assessed with clinical judgement   ADL Overall ADL's : Needs assistance/impaired                                       General ADL Comments: Pt simulated donning socks with MOD I at EOB. Increased tolerance for seated EOB ADL on 14L O2.     Vision Baseline Vision/History: Wears glasses Wears Glasses: Reading only Patient Visual Report: No change from baseline     Perception     Praxis      Cognition Arousal/Alertness: Awake/alert Behavior During Therapy: WFL for tasks assessed/performed Overall Cognitive Status: Within Functional Limits for tasks assessed                                 General Comments: patient is very pleasant and talkative,  great at problem solving, good safety awareness        Exercises Exercises: General Lower Extremity;Other exercises General Exercises - Lower Extremity Ankle Circles/Pumps: AROM;Strengthening;Both;10 reps;Supine Short Arc Quad: AROM;Strengthening;Both;10 reps;Supine Heel Slides: Strengthening;Both;10 reps;Supine;AAROM Hip ABduction/ADduction: AAROM;Strengthening;Both;10 reps;Supine Straight Leg Raises: AAROM;Strengthening;Both;10 reps;Supine Other Exercises Other Exercises: Exercises performed x 2 sets of each with A.AROM at times to achieve end range of motion. verbal cues for technique. Sp02 decreased to  89% briefly during session on 14 L02.  Other Exercises: Sitting balance and activity tolerance at EOB x12 min Other Exercises: Reviewed education on energy conservation strategies, preferred DME use for ADL participation, pursed lip breathing and plan of care   Shoulder Instructions       General Comments Pt on 14L O2 during session. Semi-reclined at start: SpO2 97%. Seated EOB x12 min: initial SpO2 83-86% when talking, increased to 88-91%. During transfer back to supine: dropped to 75% (possible inaccurate reading, but pt showing signs of SOB). Rebounded to >88% after 2 minutes.    Pertinent Vitals/ Pain       Pain Assessment: No/denies pain  Home Living                                          Prior Functioning/Environment              Frequency  Min 2X/week        Progress Toward Goals  OT Goals(current goals can now be found in the care plan section)     Acute Rehab OT Goals Patient Stated Goal: to return home and care for her dog  OT Goal Formulation: With patient Time For Goal Achievement: 04/21/20 Potential to Achieve Goals: Good  Plan Discharge plan remains appropriate;Frequency remains appropriate    Co-evaluation                 AM-PAC OT "6 Clicks" Daily Activity     Outcome Measure   Help from another person eating meals?: None Help from another person taking care of personal grooming?: None Help from another person toileting, which includes using toliet, bedpan, or urinal?: A Lot (for [facility]) Help from another person bathing (including washing, rinsing, drying)?: A Lot Help from another person to put on and taking off regular upper body clothing?: A Little Help from another person to put on and taking off regular lower body clothing?: A Lot 6 Click Score: 17    End of Session    OT Visit Diagnosis: Other abnormalities of gait and mobility (R26.89);Muscle weakness (generalized) (M62.81)   Activity Tolerance Patient  tolerated treatment well;Other (comment) (Pt limited by oxygen levels)   Patient Left in bed;with call bell/phone within reach;with bed alarm set   Nurse Communication Other (comment) (Pt oxygen levels)        Time: 7322-0254 OT Time Calculation (min): 26 min  Charges: OT General Charges $OT Visit: 1 Visit OT Treatments $Self Care/Home Management : 8-22 mins $Therapeutic Activity: 8-22 mins  Jerilynn Birkenhead, OTS 04/08/20, 5:12 PM

## 2020-04-08 NOTE — Progress Notes (Signed)
PROGRESS NOTE    Carol Gay  QIW:[id number] DOB: 1946/01/11 DOA: 04/03/2020 PCP: Glean Hess, MD    Assessment & Plan:   Active Problems:   Pneumonia  Right lung nodule: as per CT chest. Etiology unclear, possible primary bronchogenic malignancy w/ mediastinal & hilar adenopathy. Will likely need a biopsy. Pulmon following and recs apprec  COPD exacerbation: severe. Continue on IV solumedrol, bronchodilators. Continue on supplemental oxygen and wean as tolerated, currently on 40 HFNC. Oxygen demand is labile. Completed abx course. Previous smoker but quit 3-4 years ago. Pulmon recs apprec    RLL pneumonia: completed abx course. Continue on bronchodilators. Encourage incentive spirometry & flutter valve. Continue on supplemental oxygen and wean as tolerated, currently on HFNC. Pulmon recs apprec   Acute on chronic hypoxic respiratory failure: secondary to above. Labile. At home pt is on 6L [place] and currently 14 HFNC. Pulmon following and recs apprec   Unlikely bacteremia: growing 1/4 gram positive cocci, stap capitis, likely containment. Repeat blood cxs NGTD. Echo shows ER 41-74%, grade I diastolic dysfunction & pulmonary HTN.  Urine cx NGTD  Leukocytosis: resolved  HTN: continue on amlodipine. IV labetalol prn  Hypokalemia: WNL today. Will continue to monitor     DVT prophylaxis: lovenox  Code Status: full  Family Communication: Disposition Plan: likely d/c w/ home health. PT/OT recs HH   Status is: Inpatient  Remains inpatient appropriate because:IV treatments appropriate due to intensity of illness or inability to take PO, still requiring a lot of oxygen & may need a lung nodule biopsy     Dispo: The patient is from: Home              Anticipated d/c is to: Home  Health vs SNF               Anticipated d/c date is: 2 days               Patient currently is not medically stable to d/c.    Consultants:   pulmon   Procedures:   Antimicrobials:    Subjective: Pt c/o shortness of breath, slightly improved from day prior   Objective: Vitals:   04/07/20 1603 04/07/20 1931 04/08/20 0433 04/08/20 0724  BP: 130/69 120/76 133/77 117/64  Pulse: 85 80 93 73  Resp: 20   18  Temp: (!) 97.4 F (36.3 C) (!) 97.5 F (36.4 C) 97.7 F (36.5 C) (!) 97.5 F (36.4 C)  TempSrc:  Oral Oral Oral  SpO2: 97% 91% (!) 85% 93%  Weight:   78 kg   Height:        Intake/Output Summary (Last 24 hours) at 04/08/2020 0800 Last data filed at 04/08/2020 0814 Gross per 24 hour  Intake 651.44 ml  Output 1200 ml  Net -548.56 ml   Filed Weights   04/06/20 0637 04/07/20 0407 04/08/20 0433  Weight: 78.8 kg 78.2 kg 78 kg    Examination:  General exam: Appears calm & comfortable  Respiratory system: decreased breath sounds b/l. No wheezes Cardiovascular system: S1 & S2+. No rubs, gallops or clicks.  Gastrointestinal system: Abdomen is obese, soft and nontender.  Hyperactive bowel sounds heard. Central nervous system: Alert and oriented. Moves all 4 extremities  Psychiatry: Judgement and insight appear normal. Flat mood and affect      Data Reviewed: I have personally reviewed following labs and imaging studies  CBC: Recent Labs  Lab 04/03/20 1543 04/03/20 1543 04/04/20 0538 04/05/20 0402 04/06/20 0616 04/07/20 0300  04/08/20 0455  WBC 9.4   < > 6.3 12.5* 8.6 7.0 6.6  NEUTROABS 6.2  --   --   --   --   --   --   HGB 15.7*   < > 12.5 13.8 14.8 14.0 14.1  HCT 45.8   < > 37.2 40.4 42.7 41.5 43.2  MCV 88.6   < > 89.4 89.0 89.9 89.8 91.5  PLT 312   < > 246 267 226 232 234   < > = values in this interval not displayed.   Basic Metabolic Panel: Recent Labs  Lab 04/04/20 0538 04/05/20 0402 04/06/20 0616 04/07/20 0300 04/08/20 0455  NA 142 141 140 138 136  K 4.5 3.5 3.4* 3.4* 4.1  CL 112* 107 100 101 99  CO2 22 23 27 30 29   GLUCOSE 149* 142* 134* 123* 120*  BUN 8 12 21  32* 34*  CREATININE 0.87 0.76 0.75 0.78 0.75  CALCIUM 9.3 10.0  10.1 9.7 9.8   GFR: Estimated Creatinine Clearance: 59.7 mL/min (by C-G formula based on SCr of 0.75 mg/dL). Liver Function Tests: Recent Labs  Lab 04/03/20 1543  AST 26  ALT 14  ALKPHOS 84  BILITOT 1.0  PROT 9.1*  ALBUMIN 4.4   No results for input(s): LIPASE, AMYLASE in the last 168 hours. No results for input(s): AMMONIA in the last 168 hours. Coagulation Profile: No results for input(s): INR, PROTIME in the last 168 hours. Cardiac Enzymes: No results for input(s): CKTOTAL, CKMB, CKMBINDEX, TROPONINI in the last 168 hours. BNP (last 3 results) No results for input(s): PROBNP in the last 8760 hours. HbA1C: No results for input(s): HGBA1C in the last 72 hours. CBG: Recent Labs  Lab 04/05/20 1142  GLUCAP 115*   Lipid Profile: No results for input(s): CHOL, HDL, LDLCALC, TRIG, CHOLHDL, LDLDIRECT in the last 72 hours. Thyroid Function Tests: No results for input(s): TSH, T4TOTAL, FREET4, T3FREE, THYROIDAB in the last 72 hours. Anemia Panel: No results for input(s): VITAMINB12, FOLATE, FERRITIN, TIBC, IRON, RETICCTPCT in the last 72 hours. Sepsis Labs: Recent Labs  Lab 04/03/20 1704 04/03/20 1813 04/05/20 1317 04/06/20 0616 04/07/20 0300  PROCALCITON  --   --  <0.10 <0.10 <0.10  LATICACIDVEN 3.3* 3.9*  --   --   --     Recent Results (from the past 240 hour(s))  SARS Coronavirus 2 by RT PCR (hospital order, performed in [place] hospital lab) Nasopharyngeal Nasopharyngeal Swab     Status: None   Collection Time: 04/03/20  5:04 PM   Specimen: Nasopharyngeal Swab  Result Value Ref Range Status   SARS Coronavirus 2 NEGATIVE NEGATIVE Final    Comment: (NOTE) SARS-CoV-2 target nucleic acids are NOT DETECTED.  The SARS-CoV-2 RNA is generally detectable in upper and lower respiratory specimens during the acute phase of infection. The lowest concentration of SARS-CoV-2 viral copies this assay can detect is 250 copies / mL. A negative result does not preclude  SARS-CoV-2 infection and should not be used as the sole basis for treatment or other patient management decisions.  A negative result may occur with improper specimen collection / handling, submission of specimen other than nasopharyngeal swab, presence of viral mutation(s) within the areas targeted by this assay, and inadequate number of viral copies (<250 copies / mL). A negative result must be combined with clinical observations, patient history, and epidemiological information.  Fact Sheet for Patients:   StrictlyIdeas.no  Fact Sheet for Healthcare Providers: BankingDealers.co.za  This test is not  yet approved or  cleared by the [place] and has been authorized for detection and/or diagnosis of SARS-CoV-2 by FDA under an Emergency Use Authorization (EUA).  This EUA will remain in effect (meaning this test can be used) for the duration of the COVID-19 declaration under Section 564(b)(1) of the Act, 21 U.S.C. section 360bbb-3(b)(1), unless the authorization is terminated or revoked sooner.  Performed at [facility], [address] [place], [place] [address]   Culture, blood (routine x 2)     Status: None (Preliminary result)   Collection Time: 04/03/20  5:23 PM   Specimen: BLOOD  Result Value Ref Range Status   Specimen Description BLOOD  Final   Special Requests NONE  Final   Culture   Final    NO GROWTH 4 DAYS Performed at [facility], [address], [place], [place] [address]    Report Status PENDING  Incomplete  Culture, blood (routine x 2)     Status: Abnormal   Collection Time: 04/03/20  5:24 PM   Specimen: BLOOD LEFT HAND  Result Value Ref Range Status   Specimen Description   Final    BLOOD LEFT HAND Performed at [facility], [place] [address] [place], [place] [address]    Special Requests   Final    NONE Performed at [facility], [address] [place], [place]  [address]    Culture  Setup Time   Final    GRAM POSITIVE COCCI AEROBIC BOTTLE ONLY CRITICAL RESULT CALLED TO, READ BACK BY AND VERIFIED WITH: ABBY ELLINGTON 04/04/20 AT 1750 BY AR    Culture (A)  Final    STAPHYLOCOCCUS CAPITIS THE SIGNIFICANCE OF ISOLATING THIS ORGANISM FROM A SINGLE SET OF BLOOD CULTURES WHEN MULTIPLE SETS ARE DRAWN IS UNCERTAIN. PLEASE NOTIFY THE MICROBIOLOGY DEPARTMENT WITHIN ONE WEEK IF SPECIATION AND SENSITIVITIES ARE REQUIRED. Performed at [facility], [place] [address], [place],  [address]    Report Status 04/05/2020 FINAL  Final  Blood Culture ID Panel (Reflexed)     Status: Abnormal   Collection Time: 04/03/20  5:24 PM  Result Value Ref Range Status   Enterococcus species NOT DETECTED NOT DETECTED Final   Listeria monocytogenes NOT DETECTED NOT DETECTED Final   Staphylococcus species DETECTED (A) NOT DETECTED Final    Comment: Methicillin (oxacillin) susceptible coagulase negative staphylococcus. Possible blood culture contaminant (unless isolated from more than one blood culture draw or clinical case suggests pathogenicity). No antibiotic treatment is indicated for blood  culture contaminants. CRITICAL RESULT CALLED TO, READ BACK BY AND VERIFIED WITH: ABBY ELLINGTON 04/04/20 @1730  BY ACR    Staphylococcus aureus (BCID) NOT DETECTED NOT DETECTED Final   Methicillin resistance NOT DETECTED NOT DETECTED Final   Streptococcus species NOT DETECTED NOT DETECTED Final   Streptococcus agalactiae NOT DETECTED NOT DETECTED Final   Streptococcus pneumoniae NOT DETECTED NOT DETECTED Final   Streptococcus pyogenes NOT DETECTED NOT DETECTED Final   Acinetobacter baumannii NOT DETECTED NOT DETECTED Final   Enterobacteriaceae species NOT DETECTED NOT DETECTED Final   Enterobacter cloacae complex NOT DETECTED NOT DETECTED Final   Escherichia coli NOT DETECTED NOT DETECTED Final   Klebsiella oxytoca NOT DETECTED NOT DETECTED Final   Klebsiella pneumoniae NOT  DETECTED NOT DETECTED Final   Proteus species NOT DETECTED NOT DETECTED Final   Serratia marcescens NOT DETECTED NOT DETECTED Final   Haemophilus influenzae NOT DETECTED NOT DETECTED Final   Neisseria meningitidis NOT DETECTED NOT DETECTED  Final   Pseudomonas aeruginosa NOT DETECTED NOT DETECTED Final   Candida albicans NOT DETECTED NOT DETECTED Final   Candida glabrata NOT DETECTED NOT DETECTED Final   Candida krusei NOT DETECTED NOT DETECTED Final   Candida parapsilosis NOT DETECTED NOT DETECTED Final   Candida tropicalis NOT DETECTED NOT DETECTED Final    Comment: Performed at [facility], [address] [place], [place] [address]  Urine Culture     Status: None   Collection Time: 04/05/20 11:50 AM   Specimen: Urine, Random  Result Value Ref Range Status   Specimen Description   Final    URINE, RANDOM Performed at [facility], [address] [place], [place] [address]    Special Requests   Final    NONE Performed at [facility], [address], [place], [place] [address]    Culture   Final    NO GROWTH Performed at [facility], [place] [address], [place], [place] [address]    Report Status 04/06/2020 FINAL  Final  CULTURE, BLOOD (ROUTINE X 2) w Reflex to ID Panel     Status: None (Preliminary result)   Collection Time: 04/05/20  1:17 PM   Specimen: BLOOD  Result Value Ref Range Status   Specimen Description BLOOD RAC  Final   Special Requests   Final    BOTTLES DRAWN AEROBIC AND ANAEROBIC Blood Culture adequate volume   Culture   Final    NO GROWTH 2 DAYS Performed at [facility], [address], [place], [place] [address]    Report Status PENDING  Incomplete  CULTURE, BLOOD (ROUTINE X 2) w Reflex to ID Panel     Status: None (Preliminary result)   Collection Time: 04/05/20  1:21 PM   Specimen: BLOOD  Result Value Ref Range Status   Specimen Description BLOOD LAC  Final   Special Requests   Final    BOTTLES DRAWN  AEROBIC AND ANAEROBIC Blood Culture adequate volume   Culture   Final    NO GROWTH 2 DAYS Performed at [facility], [address] [place], [place] [address]    Report Status PENDING  Incomplete  MRSA PCR Screening     Status: None   Collection Time: 04/05/20  2:45 PM   Specimen: Nasopharyngeal  Result Value Ref Range Status   MRSA by PCR NEGATIVE NEGATIVE Final    Comment:        The GeneXpert MRSA Assay (FDA approved for NASAL specimens only), is one component of a comprehensive MRSA colonization surveillance program. It is not intended to diagnose MRSA infection nor to guide or monitor treatment for MRSA infections. Performed at [facility], [address] [place], [place] [address]   Respiratory Panel by PCR     Status: None   Collection Time: 04/05/20  2:45 PM   Specimen: Nasopharyngeal Swab; Respiratory  Result Value Ref Range Status   Adenovirus NOT DETECTED NOT DETECTED Final   Coronavirus 229E NOT DETECTED NOT DETECTED Final    Comment: (NOTE) The Coronavirus on the Respiratory Panel, DOES NOT test for the novel  Coronavirus (2019 nCoV)    Coronavirus HKU1 NOT DETECTED NOT DETECTED Final   Coronavirus NL63 NOT DETECTED NOT DETECTED Final   Coronavirus OC43 NOT DETECTED NOT DETECTED Final   Metapneumovirus NOT DETECTED NOT DETECTED Final   Rhinovirus / Enterovirus NOT DETECTED NOT DETECTED Final   Influenza A NOT DETECTED NOT DETECTED Final   Influenza B  NOT DETECTED NOT DETECTED Final   Parainfluenza Virus 1 NOT DETECTED NOT DETECTED Final   Parainfluenza Virus 2 NOT DETECTED NOT DETECTED Final   Parainfluenza Virus 3 NOT DETECTED NOT DETECTED Final   Parainfluenza Virus 4 NOT DETECTED NOT DETECTED Final   Respiratory Syncytial Virus NOT DETECTED NOT DETECTED Final   Bordetella pertussis NOT DETECTED NOT DETECTED Final   Chlamydophila pneumoniae NOT DETECTED NOT DETECTED Final   Mycoplasma pneumoniae NOT DETECTED NOT DETECTED Final     Comment: Performed at [facility], [place] [address] [place], [place] [address]  Expectorated sputum assessment w rflx to resp cult     Status: None   Collection Time: 04/05/20  5:50 PM   Specimen: Sputum  Result Value Ref Range Status   Specimen Description SPUTUM  Final   Special Requests NONE  Final   Sputum evaluation   Final    THIS SPECIMEN IS ACCEPTABLE FOR SPUTUM CULTURE Performed at [facility], [address] [place], [place] [address]    Report Status 04/06/2020 FINAL  Final  Culture, respiratory     Status: None (Preliminary result)   Collection Time: 04/05/20  5:50 PM   Specimen: SPU  Result Value Ref Range Status   Specimen Description   Final    SPUTUM Performed at [facility], [address], [place], [place] [address]    Special Requests   Final    NONE Reflexed from [id number] Performed at [facility], [address] [place], [place] [address]    Gram Stain   Final    RARE WBC PRESENT, PREDOMINANTLY MONONUCLEAR RARE SQUAMOUS EPITHELIAL CELLS PRESENT NO ORGANISMS SEEN    Culture   Final    NO GROWTH < 12 HOURS Performed at [facility], [place] [address] [place], [place] [address]    Report Status PENDING  Incomplete         Radiology Studies: CT CHEST W CONTRAST  Result Date: 04/07/2020 CLINICAL DATA:  Progressive hypoxemia EXAM: CT CHEST WITH CONTRAST TECHNIQUE: Multidetector CT imaging of the chest was performed during intravenous contrast administration. CONTRAST:  63mL OMNIPAQUE IOHEXOL 300 MG/ML  SOLN COMPARISON:  CT 07/02/2016, radiograph 04/05/2020 FINDINGS: Cardiovascular: Cardiac size at the upper limits of normal. Trace pericardial thickening similar to priors. Coronary artery calcifications are present. Dense calcifications present at the aortic root and throughout the thoracic aorta. Aorta is normal caliber. No acute luminal abnormality nor periaortic stranding or hemorrhage. Normal 3 vessel branching of  the aortic arch. Extensive calcification of the proximal great vessels. Central pulmonary arteries are borderline enlarged. No large central or lobar filling defects are seen on this limited, non tailored examination of the pulmonary arteries. Right IJ approach Port-A-Cath tip terminates at the lower SVC. Mediastinum/Nodes: Mediastinal and hilar adenopathy: Index nodes include pretracheal lymph node measuring up to 2.5 cm in size (2/48), 13 mm right hilar lymph node (2/74), 10 mm subcarinal lymph node (2/69). No acute abnormality of the trachea or esophagus. Slight rightward mediastinal shift likely related to volume loss in the right hemithorax. Thyroid gland and thoracic inlet are unremarkable. Lungs/Pleura: Extensive centrilobular and paraseptal emphysematous changes throughout the lungs. Chronic areas of subpleural reticulation and fibrotic change most pronounced in the right hemithorax. There is an irregular, spiculated nodule with some central cavitation measuring up to 2 x 2 x 1.3 cm in size (2/63). A more dense area of consolidated lung and volume loss with underlying heterogeneous enhancement is noted in  the right middle lobe along the paramediastinal border with some associated air bronchograms more centrally. No other concerning pulmonary nodules or masses. Scattered calcified granulomata are noted throughout both lungs. Chronic area of scarring in the left lung base with some likely adjacent subsegmental atelectasis. Upper Abdomen: Right renal scarring. No acute abnormalities present in the visualized portions of the upper abdomen. Musculoskeletal: No acute osseous abnormality or suspicious osseous lesion. Degenerative changes in the spine and shoulders. No suspicious chest wall lesions. IMPRESSION: 1. Irregular, spiculated nodule with some central cavitation in the right upper lobe measuring up to 2 cm in size, concerning for primary bronchogenic malignancy or less likely metastasis. Cavitary pneumonia  is less favored given the additional findings in the chest. 2. More dense area of consolidated lung and volume loss with underlying heterogeneous enhancement in the right middle lobe along the paramediastinal border with some associated air bronchograms more centrally. Certainly could reflect an acute consolidative airspace process/pneumonia though underlying mass lesion is not excluded. 3. Mediastinal and hilar adenopathy, concerning for metastatic disease. 4. Borderline enlarged central pulmonary arteries, which can be seen with pulmonary arterial hypertension. 5. Aortic Atherosclerosis (ICD10-I70.0) 6. Emphysema (ICD10-J43.9). These results will be called to the ordering clinician or representative by the Radiologist Assistant, and communication documented in the PACS or [employer]. Elevated Electronically Signed   By: Lovena Le M.D.   On: 04/07/2020 19:37        Scheduled Meds: . amLODipine  5 mg Oral Daily  . atorvastatin  20 mg Oral QHS  . Chlorhexidine Gluconate Cloth  6 each Topical Daily  . cyclobenzaprine  10 mg Oral BID  . enoxaparin (LOVENOX) injection  40 mg Subcutaneous Q24H  . guaiFENesin  600 mg Oral BID  . ipratropium-albuterol  3 mL Nebulization Q6H  . methylPREDNISolone (SOLU-MEDROL) injection  40 mg Intravenous Q12H   Continuous Infusions: . sodium chloride Stopped (04/07/20 1535)  .  ceFAZolin (ANCEF) IV 2 g (04/08/20 0512)     LOS: 5 days    Time spent: 30 mins     Wyvonnia Dusky, MD [facility] Pager [phone]  If 7PM-7AM, please contact night-coverage [email or web address] 04/08/2020, 8:00 AM

## 2020-04-09 LAB — BASIC METABOLIC PANEL
Anion gap: 7 (ref 5–15)
BUN: 41 mg/dL — ABNORMAL HIGH (ref 8–23)
CO2: 31 mmol/L (ref 22–32)
Calcium: 9.6 mg/dL (ref 8.9–10.3)
Chloride: 96 mmol/L — ABNORMAL LOW (ref 98–111)
Creatinine, Ser: 1 mg/dL (ref 0.44–1.00)
GFR calc Af Amer: >60 mL/min (ref >60)
GFR calc non Af Amer: 55 mL/min — ABNORMAL LOW (ref >60)
Glucose, Bld: 106 mg/dL — ABNORMAL HIGH (ref 70–99)
Potassium: 5.2 mmol/L — ABNORMAL HIGH (ref 3.5–5.1)
Sodium: 134 mmol/L — ABNORMAL LOW (ref 135–145)

## 2020-04-09 LAB — CBC
HCT: 43 % (ref 36.0–46.0)
Hemoglobin: 14.1 g/dL (ref 12.0–15.0)
MCH: 29.9 pg (ref 26.0–34.0)
MCHC: 32.8 g/dL (ref 30.0–36.0)
MCV: 91.1 fL (ref 80.0–100.0)
Platelets: 237 10*3/uL (ref 150–400)
RBC: 4.72 MIL/uL (ref 3.87–5.11)
RDW: 14.2 % (ref 11.5–15.5)
WBC: 8.6 10*3/uL (ref 4.0–10.5)
nRBC: 0 % (ref 0.0–0.2)

## 2020-04-09 MED ORDER — PANTOPRAZOLE SODIUM 40 MG PO TBEC
40.0000 mg | DELAYED_RELEASE_TABLET | Freq: Every day | ORAL | Status: DC
  Administered 2020-04-09 – 2020-04-13 (×5): 40 mg via ORAL
  Filled 2020-04-09 (×5): qty 1

## 2020-04-09 MED ORDER — PREDNISONE 50 MG PO TABS
50.0000 mg | ORAL_TABLET | Freq: Every day | ORAL | Status: DC
  Administered 2020-04-10 – 2020-04-11 (×2): 50 mg via ORAL
  Filled 2020-04-09 (×2): qty 1

## 2020-04-09 MED ORDER — FUROSEMIDE 10 MG/ML IJ SOLN
20.0000 mg | Freq: Every day | INTRAMUSCULAR | Status: DC
  Administered 2020-04-10: 20 mg via INTRAVENOUS
  Filled 2020-04-09: qty 2

## 2020-04-09 NOTE — Progress Notes (Signed)
Pulmonary Medicine          Date: 04/09/2020,   MRN# [id number] Carol Gay November 18, 1945     AdmissionWeight: 77.3 kg                 CurrentWeight: 74.6 kg   Referring physician: Dr Jimmye Norman    CHIEF COMPLAINT:   Acute on chronic hypoxemic respiratory failure   SUBJECTIVE   Patient weaned to 9L/min HFNC bubbler. She was able to participate with PT today.  She is working with RT utilizing [place] and feels clinically improved. Overnight she diuresed well with >2200cc urine made.  IS/Acapella are at bedside and patient is using them well.   PAST MEDICAL HISTORY   Past Medical History:  Diagnosis Date   Aortic atherosclerosis (HCC)    Arm pain    Asthma    Benign neoplasm of rectosigmoid junction    Benign neoplasm of transverse colon    CAD in native artery    Cancer ([place]) 09/2015   rectum   Cigarette smoker    Claustrophobia    Colon cancer ([place])    Congenital absence of one kidney    born with only right kidney   COPD (chronic obstructive pulmonary disease) (HCC)    DDD (degenerative disc disease), lumbar        Dermatophytoses    Dyslipidemia    H/O blood clots    left leg, veins stripped   Hemorrhoids    Hypertension    Last menstrual period (LMP) > 10 days ago 1994   Myocardial infarction [facility])    "mild" - age 75   Personal history of chemotherapy    Personal history of radiation therapy    Pulmonary emphysema (HCC)    Rectal bleeding    Shortness of breath dyspnea    Smokers' cough ([place])    Spinal headache    with one of my children's birth   Vertigo    Wears dentures    has full upper and lower - doesn't wear     SURGICAL HISTORY   Past Surgical History:  Procedure Laterality Date   APPENDECTOMY     BREAST BIOPSY Left 1970's   neg   COLONOSCOPY WITH PROPOFOL N/A 11/06/2015   Procedure: COLONOSCOPY WITH PROPOFOL;  Surgeon: Lucilla Lame, MD;  Location: [place];  Service: Endoscopy;   Laterality: N/A;  LEAVE PT EARLY   EXPLORATORY LAPAROTOMY     LESION EXCISION  11/03/15   Anal - Dr Alla Feeling surg   LESION REMOVAL  1970's   from [place] CYST REMOVAL Left    POLYPECTOMY  11/06/2015   Procedure: POLYPECTOMY;  Surgeon: Lucilla Lame, MD;  Location: [place];  Service: Endoscopy;;   PORTACATH PLACEMENT N/A 12/10/2015   Procedure: INSERTION PORT-A-CATH;  Surgeon: Jules Husbands, MD;  Location: [facility];  Service: General;  Laterality: N/A;   RECTAL EXAM UNDER ANESTHESIA N/A 06/23/2016   Procedure: RECTAL EXAM UNDER ANESTHESIA;  Surgeon: Jules Husbands, MD;  Location: [facility];  Service: General;  Laterality: N/A;   VEIN LIGATION AND STRIPPING       FAMILY HISTORY   Family History  Problem Relation Age of Onset   Cancer Mother        lymphoma   Heart disease Mother    Cancer Maternal Grandmother        breast   Birth defects Maternal Grandmother  SOCIAL HISTORY   Social History   Tobacco Use   Smoking status: Former Smoker    Packs/day: 1.00    Years: 57.00    Pack years: 57.00    Types: Cigarettes    Quit date: 03/30/2016    Years since quitting: 4.0   Smokeless tobacco: Never Used   Tobacco comment: Quit in July 2017  Vaping Use   Vaping Use: Never used  Substance Use Topics   Alcohol use: No    Alcohol/week: 0.0 standard drinks   Drug use: No     MEDICATIONS    Home Medication:    Current Medication:  Current Facility-Administered Medications:    0.9 %  sodium chloride infusion, , Intravenous, PRN, Wyvonnia Dusky, MD, Stopped at 04/08/20 1900   acetaminophen (TYLENOL) tablet 650 mg, 650 mg, Oral, Q6H PRN, 650 mg at 04/08/20 2107 **OR** acetaminophen (TYLENOL) suppository 650 mg, 650 mg, Rectal, Q6H PRN, Mansy, Jan A, MD   amLODipine (NORVASC) tablet 5 mg, 5 mg, Oral, Daily, Mansy, Jan A, MD, 5 mg at 04/09/20 0943   atorvastatin (LIPITOR) tablet 20 mg, 20 mg,  Oral, QHS, Mansy, Jan A, MD, 20 mg at 04/08/20 2107   calcium carbonate (TUMS - dosed in mg elemental calcium) chewable tablet 200 mg of elemental calcium, 1 tablet, Oral, TID PRN, Mansy, Jan A, MD, 200 mg of elemental calcium at 04/09/20 1600   Chlorhexidine Gluconate Cloth 2 % PADS 6 each, 6 each, Topical, Daily, Wyvonnia Dusky, MD, 6 each at 04/09/20 0944   cyclobenzaprine (FLEXERIL) tablet 10 mg, 10 mg, Oral, BID, Mansy, Jan A, MD, 10 mg at 04/09/20 0943   enoxaparin (LOVENOX) injection 40 mg, 40 mg, Subcutaneous, Q24H, Mansy, Jan A, MD, 40 mg at 04/08/20 2107   furosemide (LASIX) injection 40 mg, 40 mg, Intravenous, Daily, Ottie Glazier, MD, 40 mg at 04/09/20 0943   guaiFENesin (MUCINEX) 12 hr tablet 600 mg, 600 mg, Oral, BID, Mansy, Jan A, MD, 600 mg at 04/09/20 1610   guaiFENesin-dextromethorphan (ROBITUSSIN DM) 100-10 MG/5ML syrup 5 mL, 5 mL, Oral, Q4H PRN, Mansy, Jan A, MD   hydrOXYzine (ATARAX/VISTARIL) tablet 25 mg, 25 mg, Oral, BID PRN, Mansy, Jan A, MD   ipratropium-albuterol (DUONEB) 0.5-2.5 (3) MG/3ML nebulizer solution 3 mL, 3 mL, Nebulization, TID, Wyvonnia Dusky, MD, 3 mL at 04/09/20 1344   labetalol (NORMODYNE) injection 20 mg, 20 mg, Intravenous, Q3H PRN, Mansy, Jan A, MD   levalbuterol Penne Lash) nebulizer solution 0.63 mg, 0.63 mg, Nebulization, Q6H PRN, Mansy, Jan A, MD, 0.63 mg at 04/05/20 1411   magnesium hydroxide (MILK OF MAGNESIA) suspension 30 mL, 30 mL, Oral, Daily PRN, Mansy, Jan A, MD   methylPREDNISolone sodium succinate (SOLU-MEDROL) 40 mg/mL injection 40 mg, 40 mg, Intravenous, Q12H, Tianni Escamilla, MD, 40 mg at 04/09/20 1404   ondansetron (ZOFRAN) tablet 4 mg, 4 mg, Oral, Q6H PRN **OR** ondansetron (ZOFRAN) injection 4 mg, 4 mg, Intravenous, Q6H PRN, Mansy, Jan A, MD, 4 mg at 04/06/20 2131   traZODone (DESYREL) tablet 25 mg, 25 mg, Oral, QHS PRN, Mansy, Jan A, MD, 25 mg at 04/08/20 2107  Facility-Administered Medications Ordered in Other  Encounters:    albuterol (PROVENTIL) (2.5 MG/3ML) 0.083% nebulizer solution 2.5 mg, 2.5 mg, Nebulization, Once, Laverle Hobby, MD    ALLERGIES   Bee venom, Aspirin, Codeine, Morphine and related, and Cat hair extract     REVIEW OF SYSTEMS    Review of Systems:  Gen:  Denies  fever,  sweats, chills weigh loss  HEENT: Denies blurred vision, double vision, ear pain, eye pain, hearing loss, nose bleeds, sore throat Cardiac:  No dizziness, chest pain or heaviness, chest tightness,edema Resp: reports cough or sputum porduction, shortness of breath,wheezing, hemoptysis,  Gi: Denies swallowing difficulty, stomach pain, nausea or vomiting, diarrhea, constipation, bowel incontinence Gu:  Denies bladder incontinence, burning urine Ext:   Denies Joint pain, stiffness or swelling Skin: Denies  skin rash, easy bruising or bleeding or hives Endoc:  Denies polyuria, polydipsia , polyphagia or weight change Psych:   Denies depression, insomnia or hallucinations   Other:  All other systems negative   VS: BP 124/62 (BP Location: Right Arm)    Pulse 93    Temp 97.8 F (36.6 C) (Oral)    Resp 19    Ht 5\' 2"  (1.575 m)    Wt 74.6 kg    SpO2 94%    BMI 30.09 kg/m      PHYSICAL EXAM    GENERAL:NAD, no fevers, chills, no weakness no fatigue HEAD: Normocephalic, atraumatic.  EYES: Pupils equal, round, reactive to light. Extraocular muscles intact. No scleral icterus.  MOUTH: Moist mucosal membrane. Dentition intact. No abscess noted.  EAR, NOSE, THROAT: Clear without exudates. No external lesions.  NECK: Supple. No thyromegaly. No nodules. No JVD.  PULMONARY: Diffuse coarse rhonchi right sided +wheezes CARDIOVASCULAR: S1 and S2. Regular rate and rhythm. No murmurs, rubs, or gallops. No edema. Pedal pulses 2+ bilaterally.  GASTROINTESTINAL: Soft, nontender, nondistended. No masses. Positive bowel sounds. No hepatosplenomegaly.  MUSCULOSKELETAL: No swelling, clubbing, or edema. Range of  motion full in all extremities.  NEUROLOGIC: Cranial nerves II through XII are intact. No gross focal neurological deficits. Sensation intact. Reflexes intact.  SKIN: No ulceration, lesions, rashes, or cyanosis. Skin warm and dry. Turgor intact.  PSYCHIATRIC: Mood, affect within normal limits. The patient is awake, alert and oriented x 3. Insight, judgment intact.       IMAGING    DG Chest 1 View  Result Date: 04/03/2020 CLINICAL DATA:  Shortness of breath EXAM: CHEST  1 VIEW COMPARISON:  01/28/2020 FINDINGS: Right-sided chest port in stable positioning. Heart size is normal. Atherosclerotic calcification of the aortic knob. Interval development of patchy and interstitial opacities within the mid to lower aspect of the right lung. Left lung is hyperexpanded and clear. Scattered granulomas are unchanged. No pleural effusion or pneumothorax identified. IMPRESSION: Interval development of patchy and interstitial opacities within the mid to lower aspect of the right lung. Findings suspicious for pneumonia. Radiographic follow-up to resolution is recommended. Electronically Signed   By: Davina Poke D.O.   On: 04/03/2020 16:24   CT CHEST W CONTRAST  Result Date: 04/07/2020 CLINICAL DATA:  Progressive hypoxemia EXAM: CT CHEST WITH CONTRAST TECHNIQUE: Multidetector CT imaging of the chest was performed during intravenous contrast administration. CONTRAST:  20mL OMNIPAQUE IOHEXOL 300 MG/ML  SOLN COMPARISON:  CT 07/02/2016, radiograph 04/05/2020 FINDINGS: Cardiovascular: Cardiac size at the upper limits of normal. Trace pericardial thickening similar to priors. Coronary artery calcifications are present. Dense calcifications present at the aortic root and throughout the thoracic aorta. Aorta is normal caliber. No acute luminal abnormality nor periaortic stranding or hemorrhage. Normal 3 vessel branching of the aortic arch. Extensive calcification of the proximal great vessels. Central pulmonary arteries  are borderline enlarged. No large central or lobar filling defects are seen on this limited, non tailored examination of the pulmonary arteries. Right IJ approach Port-A-Cath tip terminates at the lower SVC. Mediastinum/Nodes:  Mediastinal and hilar adenopathy: Index nodes include pretracheal lymph node measuring up to 2.5 cm in size (2/48), 13 mm right hilar lymph node (2/74), 10 mm subcarinal lymph node (2/69). No acute abnormality of the trachea or esophagus. Slight rightward mediastinal shift likely related to volume loss in the right hemithorax. Thyroid gland and thoracic inlet are unremarkable. Lungs/Pleura: Extensive centrilobular and paraseptal emphysematous changes throughout the lungs. Chronic areas of subpleural reticulation and fibrotic change most pronounced in the right hemithorax. There is an irregular, spiculated nodule with some central cavitation measuring up to 2 x 2 x 1.3 cm in size (2/63). A more dense area of consolidated lung and volume loss with underlying heterogeneous enhancement is noted in the right middle lobe along the paramediastinal border with some associated air bronchograms more centrally. No other concerning pulmonary nodules or masses. Scattered calcified granulomata are noted throughout both lungs. Chronic area of scarring in the left lung base with some likely adjacent subsegmental atelectasis. Upper Abdomen: Right renal scarring. No acute abnormalities present in the visualized portions of the upper abdomen. Musculoskeletal: No acute osseous abnormality or suspicious osseous lesion. Degenerative changes in the spine and shoulders. No suspicious chest wall lesions. IMPRESSION: 1. Irregular, spiculated nodule with some central cavitation in the right upper lobe measuring up to 2 cm in size, concerning for primary bronchogenic malignancy or less likely metastasis. Cavitary pneumonia is less favored given the additional findings in the chest. 2. More dense area of consolidated lung  and volume loss with underlying heterogeneous enhancement in the right middle lobe along the paramediastinal border with some associated air bronchograms more centrally. Certainly could reflect an acute consolidative airspace process/pneumonia though underlying mass lesion is not excluded. 3. Mediastinal and hilar adenopathy, concerning for metastatic disease. 4. Borderline enlarged central pulmonary arteries, which can be seen with pulmonary arterial hypertension. 5. Aortic Atherosclerosis (ICD10-I70.0) 6. Emphysema (ICD10-J43.9). These results will be called to the ordering clinician or representative by the Radiologist Assistant, and communication documented in the PACS or [employer]. Elevated Electronically Signed   By: Lovena Le M.D.   On: 04/07/2020 19:37   DG Chest Port 1 View  Result Date: 04/05/2020 CLINICAL DATA:  74 year old female with a history of shortness of breath EXAM: PORTABLE CHEST 1 VIEW COMPARISON:  04/03/2020 FINDINGS: Cardiomediastinal silhouette unchanged. Reticulonodular opacities of the right greater than left lung, unchanged from the comparison plain film. No pneumothorax. No pleural effusion. Unchanged right IJ port catheter. IMPRESSION: Similar appearance of the chest x-ray with right greater than left reticulonodular opacities, potentially multifocal infection. Unchanged right IJ port catheter. Electronically Signed   By: Corrie Mckusick D.O.   On: 04/05/2020 15:27   ECHOCARDIOGRAM COMPLETE  Result Date: 04/05/2020    ECHOCARDIOGRAM REPORT   Patient Name:   ADALIZ DOBIS Date of Exam: 04/05/2020 Medical Rec #:  [id number]        Height:       62.0 in Accession #:    [id number]       Weight:       173.7 lb Date of Birth:  [birth year]        BSA:          1.801 m Patient Age:    6 years         BP:           143/68 mmHg Patient Gender: F                HR:  100 bpm. Exam Location:  [facility] Procedure: 2D Echo Indications:     Bacteremia  History:         Patient has  no prior history of Echocardiogram examinations.                  Advanced Stage COPD; Risk Factors:Current Smoker and                  Hypertension.  Sonographer:     L Thornton-Maynard Referring Phys:  [id number] Wyvonnia Dusky Diagnosing Phys: Ida Rogue MD  Sonographer Comments: Image acquisition challenging due to COPD. IMPRESSIONS  1. Left ventricular ejection fraction, by estimation, is 55 to 60%. The left ventricle has normal function. The left ventricle has no regional wall motion abnormalities. There is mild left ventricular hypertrophy. Left ventricular diastolic parameters are consistent with Grade I diastolic dysfunction (impaired relaxation).  2. Right ventricular systolic function is mildly reduced. The right ventricular size is moderately enlarged. There is severely elevated pulmonary artery systolic pressure. The estimated right ventricular systolic pressure is 46.5 mmHg.  3. No valve vegetation noted. FINDINGS  Left Ventricle: Left ventricular ejection fraction, by estimation, is 55 to 60%. The left ventricle has normal function. The left ventricle has no regional wall motion abnormalities. The left ventricular internal cavity size was normal in size. There is  mild left ventricular hypertrophy. Left ventricular diastolic parameters are consistent with Grade I diastolic dysfunction (impaired relaxation). Right Ventricle: The right ventricular size is moderately enlarged. No increase in right ventricular wall thickness. Right ventricular systolic function is mildly reduced. There is severely elevated pulmonary artery systolic pressure. The tricuspid regurgitant velocity is 4.48 m/s, and with an assumed right atrial pressure of 10 mmHg, the estimated right ventricular systolic pressure is 68.1 mmHg. Left Atrium: Left atrial size was normal in size. Right Atrium: Right atrial size was normal in size. Pericardium: There is no evidence of pericardial effusion. Mitral Valve: The mitral valve is  normal in structure. Normal mobility of the mitral valve leaflets. Mild mitral valve regurgitation. No evidence of mitral valve stenosis. MV peak gradient, 5.4 mmHg. The mean mitral valve gradient is 3.0 mmHg. Tricuspid Valve: The tricuspid valve is normal in structure. Tricuspid valve regurgitation is mild . No evidence of tricuspid stenosis. Aortic Valve: The aortic valve was not well visualized. Aortic valve regurgitation is not visualized. No aortic stenosis is present. Aortic valve mean gradient measures 5.0 mmHg. Aortic valve peak gradient measures 7.0 mmHg. Aortic valve area, by VTI measures 1.31 cm. Pulmonic Valve: The pulmonic valve was normal in structure. Pulmonic valve regurgitation is not visualized. No evidence of pulmonic stenosis. Aorta: The aortic root is normal in size and structure. Venous: The inferior vena cava is normal in size with less than 50% respiratory variability, suggesting right atrial pressure of 8 mmHg. IAS/Shunts: No atrial level shunt detected by color flow Doppler.  LEFT VENTRICLE PLAX 2D LVIDd:         3.98 cm  Diastology LVIDs:         2.65 cm  LV e' lateral:   6.31 cm/s LV PW:         1.61 cm  LV E/e' lateral: 12.4 LV IVS:        1.55 cm  LV e' medial:    5.33 cm/s LVOT diam:     1.70 cm  LV E/e' medial:  14.7 LV SV:         38 LV SV Index:  21 LVOT Area:     2.27 cm  RIGHT VENTRICLE RV S prime:     14.60 cm/s TAPSE (M-mode): 1.8 cm LEFT ATRIUM         Index LA diam:    3.50 cm 1.94 cm/m  AORTIC VALVE AV Area (Vmax):    1.47 cm AV Area (Vmean):   1.25 cm AV Area (VTI):     1.31 cm AV Vmax:           132.00 cm/s AV Vmean:          104.000 cm/s AV VTI:            0.292 m AV Peak Grad:      7.0 mmHg AV Mean Grad:      5.0 mmHg LVOT Vmax:         85.50 cm/s LVOT Vmean:        57.500 cm/s LVOT VTI:          0.168 m LVOT/AV VTI ratio: 0.58 MITRAL VALVE                TRICUSPID VALVE MV Area (PHT): 5.00 cm     TR Peak grad:   80.3 mmHg MV Peak grad:  5.4 mmHg     TR Vmax:         448.00 cm/s MV Mean grad:  3.0 mmHg MV Vmax:       1.16 m/s     SHUNTS MV Vmean:      83.0 cm/s    Systemic VTI:  0.17 m MV E velocity: 78.40 cm/s   Systemic Diam: 1.70 cm MV A velocity: 117.00 cm/s MV E/A ratio:  0.67 Ida Rogue MD Electronically signed by Ida Rogue MD Signature Date/Time: 04/05/2020/11:07:53 PM    Final              Media Information   Document Information  Photos    04/08/2020 17:22  Attached To:  Hospital Encounter on 04/03/20  Source Information  Ottie Glazier, MD   [facility]       ASSESSMENT/PLAN   Acute on chronic hypoxemic respiratory failure  - due to right lung community acquired pneumonia vs AECOPD due to malingancy  -bacterial/viral etiology workup is negative  -respiratory culture via expectorated sputum negative -Repeat blood culturues - 1 + staph capitis likely contaminant -hx of squamous stage 3 rectal CA- s/p chemoradiotherapy -RVP-negative -MRSA nasal -negative -Procalcitonin trend -Legionella and strep pneumo negative-Bnegative  -CXR reviewed as above with RLL infiltrate -Swallow evaluation to r/o recurrent aspiration due to RLL inifiltrate - narrow Antimicrobial regimen post above workup  I have discussed CT chest findings with patient at length , she will need additional workup including MRI brain, outpatient PET.  Plan is to continue to improve her respiratory status so she can go home and complete cancer workup.  Will stop Ancef today  Community acquired pneumonia  - present on admission   - etiology not clear at this time - possible viral/bacterial vs aspiration related as above  - Vanc/Cefepime >>Zithromax/Ancef>>full course completed  -Solumedrol >>pred  - s/p IVF-dc/d  -repeat blood cx -noted legionella/strep pneumo -RVP-negative  - repeat CXR in am  Reviewed ABG with hypoxemia without hypercapnia   Severe acute exacerbation of COPD  - agree with current COPD carepath  - continue to wean  steroids-changing solumedrol to prednisone 50mg  today  - IS - I have encouraged patient to keep working with spirometer - currently tV at 850cc  per breath  - c/w antibiotics  - DuoNEb therapy per RT   Chronic hypoxemia   - PT/OT and consider palliative care    - monitor diuresis-have diuresed >2200cc overnight, will decrease lasix to 20 daily   - metaneb for recruitment maneuvers       Thank you for allowing me to participate in the care of this patient.   Patient/Family are satisfied with care plan and all questions have been answered.   This document was prepared using Dragon voice recognition software and may include unintentional dictation errors.     Ottie Glazier, M.D.  Division of [place]

## 2020-04-09 NOTE — Progress Notes (Signed)
Physical Therapy Treatment Patient Details Name: Carol Gay MRN: [id number] DOB: 02-Dec-1945 Today's Date: 04/09/2020    History of Present Illness Pt admitted for COPD exacerbation with complaints of SOB symptoms. History includes COPD on 6L of O2, HTN, HLD, asthma, COPD, and colon cancer.    PT Comments    Pt was long sitting in bed on 9 L HFNC. She agrees to trial OOB activity. She required no assistance to transition from long sitting in bed to short sitting EOB. Pt de-sats to 70 % with good waveform noted. She endorses feeling lightheaded and returned to long sitting in bed. Vcs to stop talking and focus on breathing techniques. Pt required 6 minutes of focused breathing to return to >90 % sao2 while staying on 9L HFNC. Once returned to bed and o2 stabilized. Therapist educated pt on importance of performing bed level exercises to maintain functional strength. She demonstrated ability to perform exercises I'ly. Currently acute PT recommends DC home with HHPT to follow as long as pulmonary function improves. Will continue efforts to mobilize as able per POC. RN aware of pt's concerns and inability to tolerate OOB activity. At conclusion of session, pt was long sitting in bed, call bell in reach and vitals stable.       Follow Up Recommendations  Home health PT     Equipment Recommendations  None recommended by PT    Recommendations for Other Services       Precautions / Restrictions Precautions Precautions: Fall;Other (comment) (O2 demand/desat) Precaution Comments: desats quickly with minimal activity on 9 L HFNC Restrictions Weight Bearing Restrictions: No    Mobility  Bed Mobility Overal bed mobility: Modified Independent Bed Mobility: Supine to Sit;Sit to Supine     Supine to sit: Modified independent (Device/Increase time) Sit to supine: Modified independent (Device/Increase time)   General bed mobility comments: Pt was able to long sit to EOB short sit without  physical assistance. Pt sat EOB x ~ 3 minutes. pt did desat to 70 % on 9 L HFNC. returned to long sit in bed with vcs for breathing and stop talking. she does endorse feeling lightheaded. once in bed, took 6 minutes to recover to > 90 % on 9 L hfnc  Transfers                 General transfer comment: unsafe/unable to progress 2/2 to desat and pt reporting feeling lightheaded   Ambulation/Gait                 Stairs             Wheelchair Mobility    Modified Rankin (Stroke Patients Only)       Balance Overall balance assessment: Modified Independent Sitting-balance support: Feet unsupported;No upper extremity supported Sitting balance-Leahy Scale: Good Sitting balance - Comments: no LOB in sitting at EOB       Standing balance comment: unable to trial 2/2 to sao2 desaturation to 70 %                            Cognition Arousal/Alertness: Awake/alert Behavior During Therapy: WFL for tasks assessed/performed Overall Cognitive Status: Within Functional Limits for tasks assessed                                 General Comments: patient is very pleasant and talkative, great at problem solving,  good safety awareness      Exercises Other Exercises Other Exercises: Therapist educated pt on importance of performing ther ex in bed with BLEs. she states understanding and was able to demonstrate exercises I'ly    General Comments        Pertinent Vitals/Pain Pain Assessment: No/denies pain    Home Living                      Prior Function            PT Goals (current goals can now be found in the care plan section) Acute Rehab PT Goals Patient Stated Goal: to return home and care for her dog  Progress towards PT goals: Progressing toward goals    Frequency    Min 2X/week      PT Plan Current plan remains appropriate    Co-evaluation              AM-PAC PT "6 Clicks" Mobility   Outcome Measure   Help needed turning from your back to your side while in a flat bed without using bedrails?: None Help needed moving from lying on your back to sitting on the side of a flat bed without using bedrails?: A Little Help needed moving to and from a bed to a chair (including a wheelchair)?: A Little Help needed standing up from a chair using your arms (e.g., wheelchair or bedside chair)?: A Little Help needed to walk in hospital room?: A Little Help needed climbing 3-5 steps with a railing? : A Lot 6 Click Score: 18    End of Session Equipment Utilized During Treatment: Oxygen Activity Tolerance: Patient limited by fatigue;Treatment limited secondary to medical complications (Comment) (unable to maintain safe O2 levels) Patient left: in bed;with bed alarm set;with call bell/phone within reach Nurse Communication: Mobility status PT Visit Diagnosis: Muscle weakness (generalized) (M62.81);Other abnormalities of gait and mobility (R26.89)     Time: 4158-3094 PT Time Calculation (min) (ACUTE ONLY): 15 min  Charges:  $Therapeutic Activity: 8-22 mins                     Julaine Fusi PTA 04/09/20, 11:13 AM

## 2020-04-09 NOTE — Progress Notes (Signed)
PROGRESS NOTE    Carol Gay  NLG:[id number] DOB: 1946/02/13 DOA: 04/03/2020 PCP: Glean Hess, MD    Brief Narrative:  74 year old female with history of advanced COPD and chronic hypoxemia on 6 L oxygen at home presented to the emergency room with worsening shortness of breath.  In the ER, she was requiring 11 L high flow nasal oxygen.  Chest x-ray showed worsening right-sided airspace opacification.  A CT scan showed 2 cm spiculated mass on the right side consistent with bronchogenic carcinoma.  Admit to the hospital with hypoxia with antibiotics, steroids and high oxygen requirement.   Assessment & Plan:   Active Problems:   Acute on chronic respiratory failure with hypoxia (HCC)   Pneumonia   Pulmonary mass  Acute on chronic hypoxic respiratory failure, COPD exacerbation.  Baseline severe COPD on 6 L of oxygen at home. Her respiratory status remains tenuous.  Currently on 9 to 10 L of oxygen at rest and unable to tolerate much ambulation. Chest x-ray with right lower lobe infiltrate CT scan of the chest with 2 cm irregular, spiculated nodule with central cavitation in the right upper lobe.  Consolidation right middle lobe. -Trial of the steroid therapy, aggressive bronchodilator therapy, chest physiotherapy with the breathing exercises and incentive spirometry.  Respiratory following. -Treated with 5 days of IV antibiotics with Ancef and azithromycin.  No evidence of active infection at this time.  CT scan shows dense consolidations, probably malignancy. -Keep on oxygen to keep saturations more than 88%. -Probably has primary bronchogenic carcinoma, followed by pulmonary.  Planning outpatient PET scan once oxygenation improves. -She is DNR/DNI, okay for noninvasive ventilation.  Hypertension: Blood pressure stable on amlodipine.  Hypokalemia/hypomagnesemia: Replaced with improvement. Potassium overcorrected, will discontinue potassium supplements.   DVT prophylaxis:  enoxaparin (LOVENOX) injection 40 mg Start: 04/03/20 2100   Code Status: DNR/DNI Family Communication: None today Disposition Plan: Status is: Inpatient  Remains inpatient appropriate because:Inpatient level of care appropriate due to severity of illness   Dispo: The patient is from: Home              Anticipated d/c is to: Home              Anticipated d/c date is: 3 days              Patient currently is not medically stable to d/c.         Consultants:   Pulmonary  Procedures:   None  Antimicrobials:  Antibiotics Given (last 72 hours)    Date/Time Action Medication Dose Rate   04/06/20 2014 New Bag/Given   azithromycin (ZITHROMAX) 500 mg in sodium chloride 0.9 % 250 mL IVPB 500 mg 250 mL/hr   04/06/20 2124 New Bag/Given   ceFAZolin (ANCEF) IVPB 2g/100 mL premix 2 g 200 mL/hr   04/07/20 0601 New Bag/Given   ceFAZolin (ANCEF) IVPB 2g/100 mL premix 2 g 200 mL/hr   04/07/20 1423 New Bag/Given   ceFAZolin (ANCEF) IVPB 2g/100 mL premix 2 g 200 mL/hr   04/07/20 2007 New Bag/Given   azithromycin (ZITHROMAX) 500 mg in sodium chloride 0.9 % 250 mL IVPB 500 mg 250 mL/hr   04/07/20 2220 New Bag/Given   ceFAZolin (ANCEF) IVPB 2g/100 mL premix 2 g 200 mL/hr   04/08/20 0814 New Bag/Given   ceFAZolin (ANCEF) IVPB 2g/100 mL premix 2 g 200 mL/hr         Subjective: Patient seen and examined.  No overnight events.  She was on 34  L of oxygen, when she was talking to me she was desaturating to 79%.  Does have some dry cough.  Denies any wheezing or chest pain.  Patient aware that she has very severe oxygen requirement and her condition not great.  She is aware about possible diagnosis of cancer.  Objective: Vitals:   04/09/20 0725 04/09/20 0822 04/09/20 1139 04/09/20 1344  BP: 135/69  124/62   Pulse: 76  93   Resp:      Temp: (!) 97.5 F (36.4 C)  97.8 F (36.6 C)   TempSrc: Oral  Oral   SpO2: 98% 96% 93% 94%  Weight:      Height:        Intake/Output Summary (Last  24 hours) at 04/09/2020 1429 Last data filed at 04/09/2020 1237 Gross per 24 hour  Intake 290.06 ml  Output 4350 ml  Net -4059.94 ml   Filed Weights   04/07/20 0407 04/08/20 0433 04/09/20 0447  Weight: 78.2 kg 78 kg 74.6 kg    Examination:  Physical Exam Constitutional:      General: She is not in acute distress.    Comments: On 9 liters oxygen  HENT:     Head: Normocephalic.  Cardiovascular:     Rate and Rhythm: Regular rhythm.  Pulmonary:     Effort: Pulmonary effort is normal.     Comments: Bronchial breath sounds , expiratory wheezes and fits of cough Abdominal:     General: Bowel sounds are normal.     Palpations: Abdomen is soft.  Musculoskeletal:        General: Normal range of motion.  Neurological:     General: No focal deficit present.     Mental Status: She is alert and oriented to person, place, and time.       Data Reviewed: I have personally reviewed following labs and imaging studies  CBC: Recent Labs  Lab 04/03/20 1543 04/04/20 0538 04/05/20 0402 04/06/20 0616 04/07/20 0300 04/08/20 0455 04/09/20 0441  WBC 9.4   < > 12.5* 8.6 7.0 6.6 8.6  NEUTROABS 6.2  --   --   --   --   --   --   HGB 15.7*   < > 13.8 14.8 14.0 14.1 14.1  HCT 45.8   < > 40.4 42.7 41.5 43.2 43.0  MCV 88.6   < > 89.0 89.9 89.8 91.5 91.1  PLT 312   < > 267 226 232 234 237   < > = values in this interval not displayed.   Basic Metabolic Panel: Recent Labs  Lab 04/05/20 0402 04/06/20 0616 04/07/20 0300 04/08/20 0455 04/09/20 0441  NA 141 140 138 136 134*  K 3.5 3.4* 3.4* 4.1 5.2*  CL 107 100 101 99 96*  CO2 23 27 30 29 31   GLUCOSE 142* 134* 123* 120* 106*  BUN 12 21 32* 34* 41*  CREATININE 0.76 0.75 0.78 0.75 1.00  CALCIUM 10.0 10.1 9.7 9.8 9.6   GFR: Estimated Creatinine Clearance: 46.7 mL/min (by C-G formula based on SCr of 1 mg/dL). Liver Function Tests: Recent Labs  Lab 04/03/20 1543  AST 26  ALT 14  ALKPHOS 84  BILITOT 1.0  PROT 9.1*  ALBUMIN 4.4    No results for input(s): LIPASE, AMYLASE in the last 168 hours. No results for input(s): AMMONIA in the last 168 hours. Coagulation Profile: No results for input(s): INR, PROTIME in the last 168 hours. Cardiac Enzymes: No results for input(s): CKTOTAL, CKMB, CKMBINDEX, TROPONINI  in the last 168 hours. BNP (last 3 results) No results for input(s): PROBNP in the last 8760 hours. HbA1C: No results for input(s): HGBA1C in the last 72 hours. CBG: Recent Labs  Lab 04/05/20 1142  GLUCAP 115*   Lipid Profile: No results for input(s): CHOL, HDL, LDLCALC, TRIG, CHOLHDL, LDLDIRECT in the last 72 hours. Thyroid Function Tests: No results for input(s): TSH, T4TOTAL, FREET4, T3FREE, THYROIDAB in the last 72 hours. Anemia Panel: No results for input(s): VITAMINB12, FOLATE, FERRITIN, TIBC, IRON, RETICCTPCT in the last 72 hours. Sepsis Labs: Recent Labs  Lab 04/03/20 1704 04/03/20 1813 04/05/20 1317 04/06/20 0616 04/07/20 0300  PROCALCITON  --   --  <0.10 <0.10 <0.10  LATICACIDVEN 3.3* 3.9*  --   --   --     Recent Results (from the past 240 hour(s))  SARS Coronavirus 2 by RT PCR (hospital order, performed in [facility] hospital lab) Nasopharyngeal Nasopharyngeal Swab     Status: None   Collection Time: 04/03/20  5:04 PM   Specimen: Nasopharyngeal Swab  Result Value Ref Range Status   SARS Coronavirus 2 NEGATIVE NEGATIVE Final    Comment: (NOTE) SARS-CoV-2 target nucleic acids are NOT DETECTED.  The SARS-CoV-2 RNA is generally detectable in upper and lower respiratory specimens during the acute phase of infection. The lowest concentration of SARS-CoV-2 viral copies this assay can detect is 250 copies / mL. A negative result does not preclude SARS-CoV-2 infection and should not be used as the sole basis for treatment or other patient management decisions.  A negative result may occur with improper specimen collection / handling, submission of specimen other than nasopharyngeal  swab, presence of viral mutation(s) within the areas targeted by this assay, and inadequate number of viral copies (<250 copies / mL). A negative result must be combined with clinical observations, patient history, and epidemiological information.  Fact Sheet for Patients:   StrictlyIdeas.no  Fact Sheet for Healthcare Providers: BankingDealers.co.za  This test is not yet approved or  cleared by the [place] FDA and has been authorized for detection and/or diagnosis of SARS-CoV-2 by FDA under an Emergency Use Authorization (EUA).  This EUA will remain in effect (meaning this test can be used) for the duration of the COVID-19 declaration under Section 564(b)(1) of the Act, 21 U.S.C. section 360bbb-3(b)(1), unless the authorization is terminated or revoked sooner.  Performed at [facility], [address] [place], [place] [address]   Culture, blood (routine x 2)     Status: None   Collection Time: 04/03/20  5:23 PM   Specimen: BLOOD  Result Value Ref Range Status   Specimen Description BLOOD  Final   Special Requests NONE  Final   Culture   Final    NO GROWTH 5 DAYS Performed at [facility], [address] [place], [place] [address]    Report Status 04/08/2020 FINAL  Final  Culture, blood (routine x 2)     Status: Abnormal   Collection Time: 04/03/20  5:24 PM   Specimen: BLOOD LEFT HAND  Result Value Ref Range Status   Specimen Description   Final    BLOOD LEFT HAND Performed at [facility], [place] [address], [place], [place] [address]    Special Requests   Final    NONE Performed at [facility], [address] [place], [place] [address]    Culture  Setup Time   Final    GRAM POSITIVE COCCI AEROBIC BOTTLE ONLY CRITICAL  RESULT CALLED TO, READ BACK BY AND VERIFIED WITH: ABBY ELLINGTON 04/04/20 AT 1750 BY AR    Culture (A)  Final    STAPHYLOCOCCUS CAPITIS THE SIGNIFICANCE OF  ISOLATING THIS ORGANISM FROM A SINGLE SET OF BLOOD CULTURES WHEN MULTIPLE SETS ARE DRAWN IS UNCERTAIN. PLEASE NOTIFY THE MICROBIOLOGY DEPARTMENT WITHIN ONE WEEK IF SPECIATION AND SENSITIVITIES ARE REQUIRED. Performed at [facility], [place] [address], [place], [place] [address]    Report Status 04/05/2020 FINAL  Final  Blood Culture ID Panel (Reflexed)     Status: Abnormal   Collection Time: 04/03/20  5:24 PM  Result Value Ref Range Status   Enterococcus species NOT DETECTED NOT DETECTED Final   Listeria monocytogenes NOT DETECTED NOT DETECTED Final   Staphylococcus species DETECTED (A) NOT DETECTED Final    Comment: Methicillin (oxacillin) susceptible coagulase negative staphylococcus. Possible blood culture contaminant (unless isolated from more than one blood culture draw or clinical case suggests pathogenicity). No antibiotic treatment is indicated for blood  culture contaminants. CRITICAL RESULT CALLED TO, READ BACK BY AND VERIFIED WITH: ABBY ELLINGTON 04/04/20 @1730  BY ACR    Staphylococcus aureus (BCID) NOT DETECTED NOT DETECTED Final   Methicillin resistance NOT DETECTED NOT DETECTED Final   Streptococcus species NOT DETECTED NOT DETECTED Final   Streptococcus agalactiae NOT DETECTED NOT DETECTED Final   Streptococcus pneumoniae NOT DETECTED NOT DETECTED Final   Streptococcus pyogenes NOT DETECTED NOT DETECTED Final   Acinetobacter baumannii NOT DETECTED NOT DETECTED Final   Enterobacteriaceae species NOT DETECTED NOT DETECTED Final   Enterobacter cloacae complex NOT DETECTED NOT DETECTED Final   Escherichia coli NOT DETECTED NOT DETECTED Final   Klebsiella oxytoca NOT DETECTED NOT DETECTED Final   Klebsiella pneumoniae NOT DETECTED NOT DETECTED Final   Proteus species NOT DETECTED NOT DETECTED Final   Serratia marcescens NOT DETECTED NOT DETECTED Final   Haemophilus influenzae NOT DETECTED NOT DETECTED Final   Neisseria meningitidis NOT DETECTED NOT DETECTED Final    Pseudomonas aeruginosa NOT DETECTED NOT DETECTED Final   Candida albicans NOT DETECTED NOT DETECTED Final   Candida glabrata NOT DETECTED NOT DETECTED Final   Candida krusei NOT DETECTED NOT DETECTED Final   Candida parapsilosis NOT DETECTED NOT DETECTED Final   Candida tropicalis NOT DETECTED NOT DETECTED Final    Comment: Performed at [facility], [address], [place], [place] [address]  Urine Culture     Status: None   Collection Time: 04/05/20 11:50 AM   Specimen: Urine, Random  Result Value Ref Range Status   Specimen Description   Final    URINE, RANDOM Performed at [facility], [address] [place], [place] [address]    Special Requests   Final    NONE Performed at [facility], [address] [place], [place] [address]    Culture   Final    NO GROWTH Performed at [facility], [address], [place], [place] [address]    Report Status 04/06/2020 FINAL  Final  CULTURE, BLOOD (ROUTINE X 2) w Reflex to ID Panel     Status: None (Preliminary result)   Collection Time: 04/05/20  1:17 PM   Specimen: BLOOD  Result Value Ref Range Status   Specimen Description BLOOD RAC  Final   Special Requests   Final    BOTTLES DRAWN AEROBIC AND ANAEROBIC Blood Culture adequate volume   Culture   Final    NO GROWTH 4 DAYS Performed at [facility], [address], [place], [place] [address]    Report Status PENDING  Incomplete  CULTURE, BLOOD (ROUTINE X 2) w Reflex to ID Panel     Status: None (Preliminary result)   Collection Time: 04/05/20  1:21 PM   Specimen: BLOOD  Result Value Ref Range Status   Specimen Description BLOOD LAC  Final   Special Requests   Final    BOTTLES DRAWN AEROBIC AND ANAEROBIC Blood Culture adequate volume   Culture   Final    NO GROWTH 4 DAYS Performed at [facility], [address] [place], [place] [address]    Report Status PENDING  Incomplete  MRSA PCR Screening     Status: None    Collection Time: 04/05/20  2:45 PM   Specimen: Nasopharyngeal  Result Value Ref Range Status   MRSA by PCR NEGATIVE NEGATIVE Final    Comment:        The GeneXpert MRSA Assay (FDA approved for NASAL specimens only), is one component of a comprehensive MRSA colonization surveillance program. It is not intended to diagnose MRSA infection nor to guide or monitor treatment for MRSA infections. Performed at [facility], [address] [place], [place] [address]   Respiratory Panel by PCR     Status: None   Collection Time: 04/05/20  2:45 PM   Specimen: Nasopharyngeal Swab; Respiratory  Result Value Ref Range Status   Adenovirus NOT DETECTED NOT DETECTED Final   Coronavirus 229E NOT DETECTED NOT DETECTED Final    Comment: (NOTE) The Coronavirus on the Respiratory Panel, DOES NOT test for the novel  Coronavirus (2019 nCoV)    Coronavirus HKU1 NOT DETECTED NOT DETECTED Final   Coronavirus NL63 NOT DETECTED NOT DETECTED Final   Coronavirus OC43 NOT DETECTED NOT DETECTED Final   Metapneumovirus NOT DETECTED NOT DETECTED Final   Rhinovirus / Enterovirus NOT DETECTED NOT DETECTED Final   Influenza A NOT DETECTED NOT DETECTED Final   Influenza B NOT DETECTED NOT DETECTED Final   Parainfluenza Virus 1 NOT DETECTED NOT DETECTED Final   Parainfluenza Virus 2 NOT DETECTED NOT DETECTED Final   Parainfluenza Virus 3 NOT DETECTED NOT DETECTED Final   Parainfluenza Virus 4 NOT DETECTED NOT DETECTED Final   Respiratory Syncytial Virus NOT DETECTED NOT DETECTED Final   Bordetella pertussis NOT DETECTED NOT DETECTED Final   Chlamydophila pneumoniae NOT DETECTED NOT DETECTED Final   Mycoplasma pneumoniae NOT DETECTED NOT DETECTED Final    Comment: Performed at [facility], [place]. [address] [place], [place] [address]  Expectorated sputum assessment w rflx to resp cult     Status: None   Collection Time: 04/05/20  5:50 PM   Specimen: Sputum  Result Value Ref Range Status    Specimen Description SPUTUM  Final   Special Requests NONE  Final   Sputum evaluation   Final    THIS SPECIMEN IS ACCEPTABLE FOR SPUTUM CULTURE Performed at [facility], [address], [place], [place] [address]    Report Status 04/06/2020 FINAL  Final  Culture, respiratory     Status: None   Collection Time: 04/05/20  5:50 PM   Specimen: SPU  Result Value Ref Range Status   Specimen Description   Final    SPUTUM Performed at [facility], [address], [place], [place] [address]    Special Requests   Final    NONE Reflexed from [phone] Performed at [facility], [address], [place], [place]  [address]    Gram Stain   Final    RARE WBC PRESENT, PREDOMINANTLY MONONUCLEAR RARE SQUAMOUS EPITHELIAL CELLS PRESENT NO ORGANISMS SEEN Performed at [facility], [place] [address], [place], [place] [address]    Culture   Final    Consistent with normal respiratory flora. FUNGUS (MOLD) ISOLATED, PROBABLE CONTAMINANT/COLONIZER (SAPROPHYTE). CONTACT MICROBIOLOGY IF FURTHER IDENTIFICATION REQUIRED [phone].    Report Status 04/08/2020 FINAL  Final         Radiology Studies: CT CHEST W CONTRAST  Result Date: 04/07/2020 CLINICAL DATA:  Progressive hypoxemia EXAM: CT CHEST WITH CONTRAST TECHNIQUE: Multidetector CT imaging of the chest was performed during intravenous contrast administration. CONTRAST:  24mL OMNIPAQUE IOHEXOL 300 MG/ML  SOLN COMPARISON:  CT 07/02/2016, radiograph 04/05/2020 FINDINGS: Cardiovascular: Cardiac size at the upper limits of normal. Trace pericardial thickening similar to priors. Coronary artery calcifications are present. Dense calcifications present at the aortic root and throughout the thoracic aorta. Aorta is normal caliber. No acute luminal abnormality nor periaortic stranding or hemorrhage. Normal 3 vessel branching of the aortic arch. Extensive calcification of the proximal great vessels. Central pulmonary arteries are  borderline enlarged. No large central or lobar filling defects are seen on this limited, non tailored examination of the pulmonary arteries. Right IJ approach Port-A-Cath tip terminates at the lower SVC. Mediastinum/Nodes: Mediastinal and hilar adenopathy: Index nodes include pretracheal lymph node measuring up to 2.5 cm in size (2/48), 13 mm right hilar lymph node (2/74), 10 mm subcarinal lymph node (2/69). No acute abnormality of the trachea or esophagus. Slight rightward mediastinal shift likely related to volume loss in the right hemithorax. Thyroid gland and thoracic inlet are unremarkable. Lungs/Pleura: Extensive centrilobular and paraseptal emphysematous changes throughout the lungs. Chronic areas of subpleural reticulation and fibrotic change most pronounced in the right hemithorax. There is an irregular, spiculated nodule with some central cavitation measuring up to 2 x 2 x 1.3 cm in size (2/63). A more dense area of consolidated lung and volume loss with underlying heterogeneous enhancement is noted in the right middle lobe along the paramediastinal border with some associated air bronchograms more centrally. No other concerning pulmonary nodules or masses. Scattered calcified granulomata are noted throughout both lungs. Chronic area of scarring in the left lung base with some likely adjacent subsegmental atelectasis. Upper Abdomen: Right renal scarring. No acute abnormalities present in the visualized portions of the upper abdomen. Musculoskeletal: No acute osseous abnormality or suspicious osseous lesion. Degenerative changes in the spine and shoulders. No suspicious chest wall lesions. IMPRESSION: 1. Irregular, spiculated nodule with some central cavitation in the right upper lobe measuring up to 2 cm in size, concerning for primary bronchogenic malignancy or less likely metastasis. Cavitary pneumonia is less favored given the additional findings in the chest. 2. More dense area of consolidated lung and  volume loss with underlying heterogeneous enhancement in the right middle lobe along the paramediastinal border with some associated air bronchograms more centrally. Certainly could reflect an acute consolidative airspace process/pneumonia though underlying mass lesion is not excluded. 3. Mediastinal and hilar adenopathy, concerning for metastatic disease. 4. Borderline enlarged central pulmonary arteries, which can be seen with pulmonary arterial hypertension. 5. Aortic Atherosclerosis (ICD10-I70.0) 6. Emphysema (ICD10-J43.9). These results will be called to the ordering clinician or representative by the Radiologist Assistant, and communication documented in the PACS or [employer]. Elevated Electronically Signed   By: Lovena Le M.D.   On: 04/07/2020 19:37        Scheduled Meds: .  amLODipine  5 mg Oral Daily  . atorvastatin  20 mg Oral QHS  . Chlorhexidine Gluconate Cloth  6 each Topical Daily  . cyclobenzaprine  10 mg Oral BID  . enoxaparin (LOVENOX) injection  40 mg Subcutaneous Q24H  . furosemide  40 mg Intravenous Daily  . guaiFENesin  600 mg Oral BID  . ipratropium-albuterol  3 mL Nebulization TID  . methylPREDNISolone (SOLU-MEDROL) injection  40 mg Intravenous Q12H   Continuous Infusions: . sodium chloride Stopped (04/08/20 1900)     LOS: 6 days    Time spent: 30 minutes    Barb Merino, MD [facility] Pager [phone]

## 2020-04-09 NOTE — Plan of Care (Signed)
  Problem: Education: Goal: Knowledge of disease or condition will improve Outcome: Progressing Goal: Knowledge of the prescribed therapeutic regimen will improve Outcome: Progressing   Problem: Activity: Goal: Ability to tolerate increased activity will improve Outcome: Not Progressing  Patient unable to tolerate minimal activity without oxygen desatting.

## 2020-04-10 ENCOUNTER — Inpatient Hospital Stay: Payer: Medicare HMO

## 2020-04-10 ENCOUNTER — Encounter: Payer: Self-pay | Admitting: Family Medicine

## 2020-04-10 LAB — CULTURE, BLOOD (ROUTINE X 2)
Culture: NO GROWTH
Culture: NO GROWTH
Special Requests: ADEQUATE
Special Requests: ADEQUATE

## 2020-04-10 LAB — EXPECTORATED SPUTUM ASSESSMENT W GRAM STAIN, RFLX TO RESP C

## 2020-04-10 MED ORDER — HYOSCYAMINE SULFATE 0.125 MG SL SUBL
0.2500 mg | SUBLINGUAL_TABLET | Freq: Once | SUBLINGUAL | Status: AC
  Administered 2020-04-10: 0.25 mg via SUBLINGUAL
  Filled 2020-04-10: qty 2

## 2020-04-10 MED ORDER — FUROSEMIDE 10 MG/ML IJ SOLN
40.0000 mg | Freq: Every day | INTRAMUSCULAR | Status: DC
  Administered 2020-04-11 – 2020-04-13 (×3): 40 mg via INTRAVENOUS
  Filled 2020-04-10 (×3): qty 4

## 2020-04-10 MED ORDER — ALUM & MAG HYDROXIDE-SIMETH 200-200-20 MG/5ML PO SUSP
30.0000 mL | Freq: Once | ORAL | Status: AC
  Administered 2020-04-10: 30 mL via ORAL
  Filled 2020-04-10: qty 30

## 2020-04-10 NOTE — Plan of Care (Signed)

## 2020-04-10 NOTE — Care Management Important Message (Signed)
Important Message  Patient Details  Name: Carol Gay MRN: [id number] Date of Birth: 1946-06-30   Medicare Important Message Given:  Yes     Dannette Barbara 04/10/2020, 1:31 PM

## 2020-04-10 NOTE — Progress Notes (Signed)
Pulmonary Medicine          Date: 04/10/2020,   MRN# [id number] Carol Gay [birth year]     AdmissionWeight: 77.3 kg                 CurrentWeight: 76.7 kg   Referring physician: Dr Jimmye Norman    CHIEF COMPLAINT:   Acute on chronic hypoxemic respiratory failure   SUBJECTIVE   Patient weaned to 6L/mmin on HFNC.  She is speaking in full sentences but does desaturate to 87%.  Plan to continue current care.    She complained of GERD symptoms despite protonix and tums. Will give 1 time levsine.   PAST MEDICAL HISTORY   Past Medical History:  Diagnosis Date  . Aortic atherosclerosis ([place])   . Arm pain   . Asthma   . Benign neoplasm of rectosigmoid junction   . Benign neoplasm of transverse colon   . CAD in native artery   . Cancer ([place]) 09/2015   rectum  . Cigarette smoker   . Claustrophobia   . Colon cancer ([place])   . 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 COPD (chronic obstructive pulmonary disease) ([place])   . DDD (degenerative disc disease), lumbar       . Dermatophytoses   . Dyslipidemia   . H/O blood clots    left leg, veins stripped  . Hemorrhoids   . Hypertension   . Last menstrual period (LMP) > 10 days ago 1994  . Myocardial infarction [facility])    "mild" - age 50  . Personal history of chemotherapy   . Personal history of radiation therapy   . Pulmonary emphysema ([place])   . Rectal bleeding   . Shortness of breath dyspnea   . Smokers' cough ([place])   . Spinal headache    with one of my children's birth  . Vertigo   . Wears dentures    has full upper and lower - doesn't wear     SURGICAL HISTORY   Past Surgical History:  Procedure Laterality Date  . APPENDECTOMY    . BREAST BIOPSY Left 1970's   neg  . COLONOSCOPY WITH PROPOFOL N/A 11/06/2015   Procedure: COLONOSCOPY WITH PROPOFOL;  Surgeon: Lucilla Lame, MD;  Location: [place];  Service: Endoscopy;  Laterality: N/A;  LEAVE PT EARLY  . EXPLORATORY  LAPAROTOMY    . LESION EXCISION  11/03/15   Anal - Dr Dahlia Byes, [facility]  . LESION REMOVAL  1970's   from tailbone  . [place] SURGERY  1997  . OVARIAN CYST REMOVAL Left   . POLYPECTOMY  11/06/2015   Procedure: POLYPECTOMY;  Surgeon: Lucilla Lame, MD;  Location: [place];  Service: Endoscopy;;  . PORTACATH PLACEMENT N/A 12/10/2015   Procedure: INSERTION PORT-A-CATH;  Surgeon: Jules Husbands, MD;  Location: [facility];  Service: General;  Laterality: N/A;  . RECTAL EXAM UNDER ANESTHESIA N/A 06/23/2016   Procedure: RECTAL EXAM UNDER ANESTHESIA;  Surgeon: Jules Husbands, MD;  Location: [facility];  Service: General;  Laterality: N/A;  . VEIN LIGATION AND STRIPPING       FAMILY HISTORY   Family History  Problem Relation Age of Onset  . Cancer Mother        lymphoma  . Heart disease Mother   . Cancer Maternal Grandmother        breast  . Birth defects Maternal Grandmother  SOCIAL HISTORY   Social History   Tobacco Use  . Smoking status: Former Smoker    Packs/day: 1.00    Years: 57.00    Pack years: 57.00    Types: Cigarettes    Quit date: 03/30/2016    Years since quitting: 4.0  . Smokeless tobacco: Never Used  . Tobacco comment: Quit in July 2017  Vaping Use  . Vaping Use: Never used  Substance Use Topics  . Alcohol use: No    Alcohol/week: 0.0 standard drinks  . Drug use: No     MEDICATIONS    Home Medication:    Current Medication:  Current Facility-Administered Medications:  .  0.9 %  sodium chloride infusion, , Intravenous, PRN, Wyvonnia Dusky, MD, Stopped at 04/08/20 1900 .  acetaminophen (TYLENOL) tablet 650 mg, 650 mg, Oral, Q6H PRN, 650 mg at 04/10/20 1541 **OR** acetaminophen (TYLENOL) suppository 650 mg, 650 mg, Rectal, Q6H PRN, Mansy, Jan A, MD .  alum & mag hydroxide-simeth (MAALOX/MYLANTA) 200-200-20 MG/5ML suspension 30 mL, 30 mL, Oral, Once, Rut Betterton, MD .  amLODipine (NORVASC) tablet 5 mg, 5 mg, Oral, Daily, Mansy, Jan A, MD,  5 mg at 04/10/20 1100 .  atorvastatin (LIPITOR) tablet 20 mg, 20 mg, Oral, QHS, Mansy, Jan A, MD, 20 mg at 04/09/20 2136 .  calcium carbonate (TUMS - dosed in mg elemental calcium) chewable tablet 200 mg of elemental calcium, 1 tablet, Oral, TID PRN, Mansy, Jan A, MD, 200 mg of elemental calcium at 04/09/20 2140 .  Chlorhexidine Gluconate Cloth 2 % PADS 6 each, 6 each, Topical, Daily, Wyvonnia Dusky, MD, 6 each at 04/10/20 1440 .  cyclobenzaprine (FLEXERIL) tablet 10 mg, 10 mg, Oral, BID, Mansy, Jan A, MD, 10 mg at 04/10/20 1100 .  enoxaparin (LOVENOX) injection 40 mg, 40 mg, Subcutaneous, Q24H, Mansy, Jan A, MD, 40 mg at 04/09/20 2136 .  [START ON 04/11/2020] furosemide (LASIX) injection 40 mg, 40 mg, Intravenous, Daily, Lanney Gins, Ulyssa Walthour, MD .  guaiFENesin (MUCINEX) 12 hr tablet 600 mg, 600 mg, Oral, BID, Mansy, Jan A, MD, 600 mg at 04/10/20 1100 .  guaiFENesin-dextromethorphan (ROBITUSSIN DM) 100-10 MG/5ML syrup 5 mL, 5 mL, Oral, Q4H PRN, Mansy, Jan A, MD .  hydrOXYzine (ATARAX/VISTARIL) tablet 25 mg, 25 mg, Oral, BID PRN, Mansy, Jan A, MD .  hyoscyamine (LEVSIN SL) SL tablet 0.25 mg, 0.25 mg, Sublingual, Once, Shyna Duignan, MD .  ipratropium-albuterol (DUONEB) 0.5-2.5 (3) MG/3ML nebulizer solution 3 mL, 3 mL, Nebulization, TID, Wyvonnia Dusky, MD, 3 mL at 04/10/20 1418 .  labetalol (NORMODYNE) injection 20 mg, 20 mg, Intravenous, Q3H PRN, Mansy, Jan A, MD .  levalbuterol Penne Lash) nebulizer solution 0.63 mg, 0.63 mg, Nebulization, Q6H PRN, Mansy, Jan A, MD, 0.63 mg at 04/05/20 1411 .  magnesium hydroxide (MILK OF MAGNESIA) suspension 30 mL, 30 mL, Oral, Daily PRN, Mansy, Jan A, MD .  ondansetron (ZOFRAN) tablet 4 mg, 4 mg, Oral, Q6H PRN **OR** ondansetron (ZOFRAN) injection 4 mg, 4 mg, Intravenous, Q6H PRN, Mansy, Jan A, MD, 4 mg at 04/06/20 2131 .  pantoprazole (PROTONIX) EC tablet 40 mg, 40 mg, Oral, Daily, Ghimire, Kuber, MD, 40 mg at 04/10/20 1100 .  predniSONE (DELTASONE) tablet  50 mg, 50 mg, Oral, Q breakfast, Deissy Guilbert, MD, 50 mg at 04/10/20 1100 .  traZODone (DESYREL) tablet 25 mg, 25 mg, Oral, QHS PRN, Mansy, Jan A, MD, 25 mg at 04/09/20 2135  Facility-Administered Medications Ordered in Other Encounters:  .  albuterol (PROVENTIL) (  2.5 MG/3ML) 0.083% nebulizer solution 2.5 mg, 2.5 mg, Nebulization, Once, Carol Hobby, MD    ALLERGIES   Bee venom, Aspirin, Codeine, Morphine and related, and Cat hair extract     REVIEW OF SYSTEMS    Review of Systems:  Gen:  Denies  fever, sweats, chills weigh loss  HEENT: Denies blurred vision, double vision, ear pain, eye pain, hearing loss, nose bleeds, sore throat Cardiac:  No dizziness, chest pain or heaviness, chest tightness,edema Resp: reports cough or sputum porduction, shortness of breath,wheezing, hemoptysis,  Gi: Denies swallowing difficulty, stomach pain, nausea or vomiting, diarrhea, constipation, bowel incontinence Gu:  Denies bladder incontinence, burning urine Ext:   Denies Joint pain, stiffness or swelling Skin: Denies  skin rash, easy bruising or bleeding or hives Endoc:  Denies polyuria, polydipsia , polyphagia or weight change Psych:   Denies depression, insomnia or hallucinations   Other:  All other systems negative   VS: BP (!) 129/69 (BP Location: Right Arm)   Pulse 93   Temp 97.7 F (36.5 C) (Oral)   Resp 20   Ht 5\' 2"  (1.575 m)   Wt 76.7 kg   SpO2 91%   BMI 30.91 kg/m      PHYSICAL EXAM    GENERAL:NAD, no fevers, chills, no weakness no fatigue HEAD: Normocephalic, atraumatic.  EYES: Pupils equal, round, reactive to light. Extraocular muscles intact. No scleral icterus.  MOUTH: Moist mucosal membrane. Dentition intact. No abscess noted.  EAR, NOSE, THROAT: Clear without exudates. No external lesions.  NECK: Supple. No thyromegaly. No nodules. No JVD.  PULMONARY: Diffuse coarse rhonchi right sided +wheezes CARDIOVASCULAR: S1 and S2. Regular rate and rhythm. No  murmurs, rubs, or gallops. No edema. Pedal pulses 2+ bilaterally.  GASTROINTESTINAL: Soft, nontender, nondistended. No masses. Positive bowel sounds. No hepatosplenomegaly.  MUSCULOSKELETAL: No swelling, clubbing, or edema. Range of motion full in all extremities.  NEUROLOGIC: Cranial nerves II through XII are intact. No gross focal neurological deficits. Sensation intact. Reflexes intact.  SKIN: No ulceration, lesions, rashes, or cyanosis. Skin warm and dry. Turgor intact.  PSYCHIATRIC: Mood, affect within normal limits. The patient is awake, alert and oriented x 3. Insight, judgment intact.       IMAGING    DG Chest 1 View  Result Date: 04/03/2020 CLINICAL DATA:  Shortness of breath EXAM: CHEST  1 VIEW COMPARISON:  01/28/2020 FINDINGS: Right-sided chest port in stable positioning. Heart size is normal. Atherosclerotic calcification of the aortic knob. Interval development of patchy and interstitial opacities within the mid to lower aspect of the right lung. Left lung is hyperexpanded and clear. Scattered granulomas are unchanged. No pleural effusion or pneumothorax identified. IMPRESSION: Interval development of patchy and interstitial opacities within the mid to lower aspect of the right lung. Findings suspicious for pneumonia. Radiographic follow-up to resolution is recommended. Electronically Signed   By: Davina Poke D.O.   On: 04/03/2020 16:24   CT CHEST W CONTRAST  Result Date: 04/07/2020 CLINICAL DATA:  Progressive hypoxemia EXAM: CT CHEST WITH CONTRAST TECHNIQUE: Multidetector CT imaging of the chest was performed during intravenous contrast administration. CONTRAST:  55mL OMNIPAQUE IOHEXOL 300 MG/ML  SOLN COMPARISON:  CT 07/02/2016, radiograph 04/05/2020 FINDINGS: Cardiovascular: Cardiac size at the upper limits of normal. Trace pericardial thickening similar to priors. Coronary artery calcifications are present. Dense calcifications present at the aortic root and throughout the  thoracic aorta. Aorta is normal caliber. No acute luminal abnormality nor periaortic stranding or hemorrhage. Normal 3 vessel branching of the aortic  arch. Extensive calcification of the proximal great vessels. Central pulmonary arteries are borderline enlarged. No large central or lobar filling defects are seen on this limited, non tailored examination of the pulmonary arteries. Right IJ approach Port-A-Cath tip terminates at the lower SVC. Mediastinum/Nodes: Mediastinal and hilar adenopathy: Index nodes include pretracheal lymph node measuring up to 2.5 cm in size (2/48), 13 mm right hilar lymph node (2/74), 10 mm subcarinal lymph node (2/69). No acute abnormality of the trachea or esophagus. Slight rightward mediastinal shift likely related to volume loss in the right hemithorax. Thyroid gland and thoracic inlet are unremarkable. Lungs/Pleura: Extensive centrilobular and paraseptal emphysematous changes throughout the lungs. Chronic areas of subpleural reticulation and fibrotic change most pronounced in the right hemithorax. There is an irregular, spiculated nodule with some central cavitation measuring up to 2 x 2 x 1.3 cm in size (2/63). A more dense area of consolidated lung and volume loss with underlying heterogeneous enhancement is noted in the right middle lobe along the paramediastinal border with some associated air bronchograms more centrally. No other concerning pulmonary nodules or masses. Scattered calcified granulomata are noted throughout both lungs. Chronic area of scarring in the left lung base with some likely adjacent subsegmental atelectasis. Upper Abdomen: Right renal scarring. No acute abnormalities present in the visualized portions of the upper abdomen. Musculoskeletal: No acute osseous abnormality or suspicious osseous lesion. Degenerative changes in the spine and shoulders. No suspicious chest wall lesions. IMPRESSION: 1. Irregular, spiculated nodule with some central cavitation in the  right upper lobe measuring up to 2 cm in size, concerning for primary bronchogenic malignancy or less likely metastasis. Cavitary pneumonia is less favored given the additional findings in the chest. 2. More dense area of consolidated lung and volume loss with underlying heterogeneous enhancement in the right middle lobe along the paramediastinal border with some associated air bronchograms more centrally. Certainly could reflect an acute consolidative airspace process/pneumonia though underlying mass lesion is not excluded. 3. Mediastinal and hilar adenopathy, concerning for metastatic disease. 4. Borderline enlarged central pulmonary arteries, which can be seen with pulmonary arterial hypertension. 5. Aortic Atherosclerosis (ICD10-I70.0) 6. Emphysema (ICD10-J43.9). These results will be called to the ordering clinician or representative by the Radiologist Assistant, and communication documented in the PACS or [employer]. Elevated Electronically Signed   By: Lovena Le M.D.   On: 04/07/2020 19:37   DG Chest Port 1 View  Result Date: 04/10/2020 CLINICAL DATA:  Respiratory failure EXAM: PORTABLE CHEST 1 VIEW COMPARISON:  04/05/2020, 04/07/2020 FINDINGS: Right-sided chest port remains in place. Heart size is normal. Atherosclerotic calcification of the aortic knob. Masslike thickening of the lower right paratracheal stripe compatible with known mediastinal lymphadenopathy. Right middle lobe atelectasis/consolidation. Nodular opacities in the right mid lung. Streaky left basilar atelectasis. Emphysematous changes to the lung bases. No large pleural fluid collection. No pneumothorax. IMPRESSION: 1. No appreciable interval change from prior. Persistent right middle lobe atelectasis/consolidation. 2. Nodular opacities in the right mid lung, as described on recent CT. 3. Mediastinal lymphadenopathy. Electronically Signed   By: Davina Poke D.O.   On: 04/10/2020 08:19   DG Chest Port 1 View  Result Date:  04/05/2020 CLINICAL DATA:  74 year old female with a history of shortness of breath EXAM: PORTABLE CHEST 1 VIEW COMPARISON:  04/03/2020 FINDINGS: Cardiomediastinal silhouette unchanged. Reticulonodular opacities of the right greater than left lung, unchanged from the comparison plain film. No pneumothorax. No pleural effusion. Unchanged right IJ port catheter. IMPRESSION: Similar appearance of the chest x-ray  with right greater than left reticulonodular opacities, potentially multifocal infection. Unchanged right IJ port catheter. Electronically Signed   By: Corrie Mckusick D.O.   On: 04/05/2020 15:27   ECHOCARDIOGRAM COMPLETE  Result Date: 04/05/2020    ECHOCARDIOGRAM REPORT   Patient Name:   SHAREEKA YIM Date of Exam: 04/05/2020 Medical Rec #:  [id number]        Height:       62.0 in Accession #:    [id number]       Weight:       173.7 lb Date of Birth:  May 17, 1946        BSA:          1.801 m Patient Age:    41 years         BP:           143/68 mmHg Patient Gender: F                HR:           100 bpm. Exam Location:  [facility] Procedure: 2D Echo Indications:     Bacteremia  History:         Patient has no prior history of Echocardiogram examinations.                  Advanced Stage COPD; Risk Factors:Current Smoker and                  Hypertension.  Sonographer:     L Thornton-Maynard Referring Phys:  [id number] Wyvonnia Dusky Diagnosing Phys: Ida Rogue MD  Sonographer Comments: Image acquisition challenging due to COPD. IMPRESSIONS  1. Left ventricular ejection fraction, by estimation, is 55 to 60%. The left ventricle has normal function. The left ventricle has no regional wall motion abnormalities. There is mild left ventricular hypertrophy. Left ventricular diastolic parameters are consistent with Grade I diastolic dysfunction (impaired relaxation).  2. Right ventricular systolic function is mildly reduced. The right ventricular size is moderately enlarged. There is severely elevated pulmonary artery  systolic pressure. The estimated right ventricular systolic pressure is 83.3 mmHg.  3. No valve vegetation noted. FINDINGS  Left Ventricle: Left ventricular ejection fraction, by estimation, is 55 to 60%. The left ventricle has normal function. The left ventricle has no regional wall motion abnormalities. The left ventricular internal cavity size was normal in size. There is  mild left ventricular hypertrophy. Left ventricular diastolic parameters are consistent with Grade I diastolic dysfunction (impaired relaxation). Right Ventricle: The right ventricular size is moderately enlarged. No increase in right ventricular wall thickness. Right ventricular systolic function is mildly reduced. There is severely elevated pulmonary artery systolic pressure. The tricuspid regurgitant velocity is 4.48 m/s, and with an assumed right atrial pressure of 10 mmHg, the estimated right ventricular systolic pressure is 82.5 mmHg. Left Atrium: Left atrial size was normal in size. Right Atrium: Right atrial size was normal in size. Pericardium: There is no evidence of pericardial effusion. Mitral Valve: The mitral valve is normal in structure. Normal mobility of the mitral valve leaflets. Mild mitral valve regurgitation. No evidence of mitral valve stenosis. MV peak gradient, 5.4 mmHg. The mean mitral valve gradient is 3.0 mmHg. Tricuspid Valve: The tricuspid valve is normal in structure. Tricuspid valve regurgitation is mild . No evidence of tricuspid stenosis. Aortic Valve: The aortic valve was not well visualized. Aortic valve regurgitation is not visualized. No aortic stenosis is present. Aortic valve mean gradient measures 5.0 mmHg. Aortic valve peak  gradient measures 7.0 mmHg. Aortic valve area, by VTI measures 1.31 cm. Pulmonic Valve: The pulmonic valve was normal in structure. Pulmonic valve regurgitation is not visualized. No evidence of pulmonic stenosis. Aorta: The aortic root is normal in size and structure. Venous: The  inferior vena cava is normal in size with less than 50% respiratory variability, suggesting right atrial pressure of 8 mmHg. IAS/Shunts: No atrial level shunt detected by color flow Doppler.  LEFT VENTRICLE PLAX 2D LVIDd:         3.98 cm  Diastology LVIDs:         2.65 cm  LV e' lateral:   6.31 cm/s LV PW:         1.61 cm  LV E/e' lateral: 12.4 LV IVS:        1.55 cm  LV e' medial:    5.33 cm/s LVOT diam:     1.70 cm  LV E/e' medial:  14.7 LV SV:         38 LV SV Index:   21 LVOT Area:     2.27 cm  RIGHT VENTRICLE RV S prime:     14.60 cm/s TAPSE (M-mode): 1.8 cm LEFT ATRIUM         Index LA diam:    3.50 cm 1.94 cm/m  AORTIC VALVE AV Area (Vmax):    1.47 cm AV Area (Vmean):   1.25 cm AV Area (VTI):     1.31 cm AV Vmax:           132.00 cm/s AV Vmean:          104.000 cm/s AV VTI:            0.292 m AV Peak Grad:      7.0 mmHg AV Mean Grad:      5.0 mmHg LVOT Vmax:         85.50 cm/s LVOT Vmean:        57.500 cm/s LVOT VTI:          0.168 m LVOT/AV VTI ratio: 0.58 MITRAL VALVE                TRICUSPID VALVE MV Area (PHT): 5.00 cm     TR Peak grad:   80.3 mmHg MV Peak grad:  5.4 mmHg     TR Vmax:        448.00 cm/s MV Mean grad:  3.0 mmHg MV Vmax:       1.16 m/s     SHUNTS MV Vmean:      83.0 cm/s    Systemic VTI:  0.17 m MV E velocity: 78.40 cm/s   Systemic Diam: 1.70 cm MV A velocity: 117.00 cm/s MV E/A ratio:  0.67 Ida Rogue MD Electronically signed by Ida Rogue MD Signature Date/Time: 04/05/2020/11:07:53 PM    Final              Media Information   Document Information  Photos    04/08/2020 17:22  Attached To:  Hospital Encounter on 04/03/20  Source Information  Ottie Glazier, MD  [facility]       ASSESSMENT/PLAN   Acute on chronic hypoxemic respiratory failure  - due to right lung community acquired pneumonia vs AECOPD due to malingancy  -bacterial/viral etiology workup is negative  -respiratory culture via expectorated sputum negative -Repeat blood  culturues - 1 + staph capitis likely contaminant -hx of squamous stage 3 rectal CA- s/p chemoradiotherapy -RVP-negative -MRSA nasal -negative -Procalcitonin trend -Legionella and  strep pneumo negative-Bnegative  -CXR reviewed as above with RLL infiltrate -Swallow evaluation to r/o recurrent aspiration due to RLL inifiltrate - narrow Antimicrobial regimen post above workup  I have discussed CT chest findings with patient at length , she will need additional workup including MRI brain, outpatient PET.  Plan is to continue to improve her respiratory status so she can go home and complete cancer workup.  Will stop Ancef today  Community acquired pneumonia  - present on admission   - etiology not clear at this time - possible viral/bacterial vs aspiration related as above  - Vanc/Cefepime >>Zithromax/Ancef>>full course completed  -Solumedrol >>pred  - s/p IVF-dc/d  -repeat blood cx -noted legionella/strep pneumo -RVP-negative  - repeat CXR in am  Reviewed ABG with hypoxemia without hypercapnia   Severe acute exacerbation of COPD  - agree with current COPD carepath  - continue to wean steroids-changing solumedrol to prednisone 50mg  today  - IS - I have encouraged patient to keep working with spirometer - currently tV at 850cc per breath  - c/w antibiotics  - DuoNEb therapy per RT   Chronic hypoxemia   - PT/OT and consider palliative care    - monitor diuresis-have diuresed >2200cc overnight, will decrease lasix to 20 daily   - metaneb for recruitment maneuvers       Thank you for allowing me to participate in the care of this patient.   Patient/Family are satisfied with care plan and all questions have been answered.   This document was prepared using Dragon voice recognition software and may include unintentional dictation errors.     Ottie Glazier, M.D.  Division of [place]

## 2020-04-10 NOTE — Progress Notes (Signed)
PROGRESS NOTE    Carol Gay  CXK:[id number] DOB: 1946/07/18 DOA: 04/03/2020 PCP: Glean Hess, MD    Brief Narrative:  74 year old female with history of advanced COPD and chronic hypoxemia on 6 L oxygen at home presented to the emergency room with worsening shortness of breath.  In the ER, she was requiring 11 L high flow nasal oxygen.  Chest x-ray showed worsening right-sided airspace opacification.  A CT scan showed 2 cm spiculated mass on the right side consistent with bronchogenic carcinoma.  Admit to the hospital with hypoxia with antibiotics, steroids and high oxygen requirement.   Assessment & Plan:   Active Problems:   Acute on chronic respiratory failure with hypoxia (HCC)   Pneumonia   Pulmonary mass  Acute on chronic hypoxic respiratory failure, COPD exacerbation.  Baseline severe COPD on 6 L of oxygen at home. Her respiratory status remains tenuous.  Currently on 7-8 L of oxygen at rest and unable to tolerate much ambulation. Chest x-ray with right-sided findings mostly atelectasis, tumor. CT scan of the chest with 2 cm irregular, spiculated nodule with central cavitation in the right upper lobe.  Consolidation right middle lobe. -Trial of the steroid therapy, aggressive bronchodilator therapy, chest physiotherapy with the breathing exercises and incentive spirometry.  Respiratory following. -Treated with 5 days of IV antibiotics with Ancef and azithromycin.  No evidence of active infection at this time.  CT scan shows dense consolidations, probably malignancy. -Keep on oxygen to keep saturations more than 88%. -Probably has primary bronchogenic carcinoma, followed by pulmonary.  Planning outpatient PET scan once oxygenation improves. -She is DNR/DNI, okay for noninvasive ventilation.  Hypertension: Blood pressure stable on amlodipine.  Hypokalemia/hypomagnesemia: Improved.   DVT prophylaxis: enoxaparin (LOVENOX) injection 40 mg Start: 04/03/20 2100   Code  Status: DNR/DNI Family Communication: None today Disposition Plan: Status is: Inpatient  Remains inpatient appropriate because:Inpatient level of care appropriate due to severity of illness   Dispo: The patient is from: Home              Anticipated d/c is to: Home              Anticipated d/c date is: 3 days              Patient currently is not medically stable to d/c.         Consultants:   Pulmonary  Procedures:   None  Antimicrobials:  Antibiotics Given (last 72 hours)    Date/Time Action Medication Dose Rate   04/07/20 1423 New Bag/Given   ceFAZolin (ANCEF) IVPB 2g/100 mL premix 2 g 200 mL/hr   04/07/20 2007 New Bag/Given   azithromycin (ZITHROMAX) 500 mg in sodium chloride 0.9 % 250 mL IVPB 500 mg 250 mL/hr   04/07/20 2220 New Bag/Given   ceFAZolin (ANCEF) IVPB 2g/100 mL premix 2 g 200 mL/hr   04/08/20 9702 New Bag/Given   ceFAZolin (ANCEF) IVPB 2g/100 mL premix 2 g 200 mL/hr         Subjective: Patient seen and examined.  Breathing reportedly slightly better.  Still on 7 or 8 L of oxygen at rest.  Has some dry cough.  Objective: Vitals:   04/09/20 1740 04/09/20 1955 04/10/20 0534 04/10/20 0728  BP:  138/75 (!) 152/79 (!) 154/70  Pulse:  87 73 74  Resp:  16 20   Temp:   (!) 97.5 F (36.4 C) (!) 97.4 F (36.3 C)  TempSrc:   Oral Oral  SpO2: 94% 92%  95% 98%  Weight:   76.7 kg   Height:        Intake/Output Summary (Last 24 hours) at 04/10/2020 1054 Last data filed at 04/10/2020 1016 Gross per 24 hour  Intake 480 ml  Output 2650 ml  Net -2170 ml   Filed Weights   04/08/20 0433 04/09/20 0447 04/10/20 0534  Weight: 78 kg 74.6 kg 76.7 kg    Examination:  Physical Exam Constitutional:      General: She is not in acute distress.    Comments: Comfortable, coughs in between sentences.  Currently on 8 L of oxygen.  HENT:     Head: Normocephalic.  Cardiovascular:     Rate and Rhythm: Regular rhythm.  Pulmonary:     Effort: Pulmonary effort  is normal.     Comments: Bilateral conducted airway sounds.  Occasional expiratory crackles.  Coughing fits on deep breathing. Abdominal:     General: Bowel sounds are normal.     Palpations: Abdomen is soft.  Musculoskeletal:        General: Normal range of motion.  Neurological:     General: No focal deficit present.     Mental Status: She is alert and oriented to person, place, and time.       Data Reviewed: I have personally reviewed following labs and imaging studies  CBC: Recent Labs  Lab 04/03/20 1543 04/04/20 0538 04/05/20 0402 04/06/20 0616 04/07/20 0300 04/08/20 0455 04/09/20 0441  WBC 9.4   < > 12.5* 8.6 7.0 6.6 8.6  NEUTROABS 6.2  --   --   --   --   --   --   HGB 15.7*   < > 13.8 14.8 14.0 14.1 14.1  HCT 45.8   < > 40.4 42.7 41.5 43.2 43.0  MCV 88.6   < > 89.0 89.9 89.8 91.5 91.1  PLT 312   < > 267 226 232 234 237   < > = values in this interval not displayed.   Basic Metabolic Panel: Recent Labs  Lab 04/05/20 0402 04/06/20 0616 04/07/20 0300 04/08/20 0455 04/09/20 0441  NA 141 140 138 136 134*  K 3.5 3.4* 3.4* 4.1 5.2*  CL 107 100 101 99 96*  CO2 23 27 30 29 31   GLUCOSE 142* 134* 123* 120* 106*  BUN 12 21 32* 34* 41*  CREATININE 0.76 0.75 0.78 0.75 1.00  CALCIUM 10.0 10.1 9.7 9.8 9.6   GFR: Estimated Creatinine Clearance: 47.3 mL/min (by C-G formula based on SCr of 1 mg/dL). Liver Function Tests: Recent Labs  Lab 04/03/20 1543  AST 26  ALT 14  ALKPHOS 84  BILITOT 1.0  PROT 9.1*  ALBUMIN 4.4   No results for input(s): LIPASE, AMYLASE in the last 168 hours. No results for input(s): AMMONIA in the last 168 hours. Coagulation Profile: No results for input(s): INR, PROTIME in the last 168 hours. Cardiac Enzymes: No results for input(s): CKTOTAL, CKMB, CKMBINDEX, TROPONINI in the last 168 hours. BNP (last 3 results) No results for input(s): PROBNP in the last 8760 hours. HbA1C: No results for input(s): HGBA1C in the last 72  hours. CBG: Recent Labs  Lab 04/05/20 1142  GLUCAP 115*   Lipid Profile: No results for input(s): CHOL, HDL, LDLCALC, TRIG, CHOLHDL, LDLDIRECT in the last 72 hours. Thyroid Function Tests: No results for input(s): TSH, T4TOTAL, FREET4, T3FREE, THYROIDAB in the last 72 hours. Anemia Panel: No results for input(s): VITAMINB12, FOLATE, FERRITIN, TIBC, IRON, RETICCTPCT in the last 72  hours. Sepsis Labs: Recent Labs  Lab 04/03/20 1704 04/03/20 1813 04/05/20 1317 04/06/20 0616 04/07/20 0300  PROCALCITON  --   --  <0.10 <0.10 <0.10  LATICACIDVEN 3.3* 3.9*  --   --   --     Recent Results (from the past 240 hour(s))  SARS Coronavirus 2 by RT PCR (hospital order, performed in [place] hospital lab) Nasopharyngeal Nasopharyngeal Swab     Status: None   Collection Time: 04/03/20  5:04 PM   Specimen: Nasopharyngeal Swab  Result Value Ref Range Status   SARS Coronavirus 2 NEGATIVE NEGATIVE Final    Comment: (NOTE) SARS-CoV-2 target nucleic acids are NOT DETECTED.  The SARS-CoV-2 RNA is generally detectable in upper and lower respiratory specimens during the acute phase of infection. The lowest concentration of SARS-CoV-2 viral copies this assay can detect is 250 copies / mL. A negative result does not preclude SARS-CoV-2 infection and should not be used as the sole basis for treatment or other patient management decisions.  A negative result may occur with improper specimen collection / handling, submission of specimen other than nasopharyngeal swab, presence of viral mutation(s) within the areas targeted by this assay, and inadequate number of viral copies (<250 copies / mL). A negative result must be combined with clinical observations, patient history, and epidemiological information.  Fact Sheet for Patients:   StrictlyIdeas.no  Fact Sheet for Healthcare Providers: BankingDealers.co.za  This test is not yet approved or   cleared by the [place] FDA and has been authorized for detection and/or diagnosis of SARS-CoV-2 by FDA under an Emergency Use Authorization (EUA).  This EUA will remain in effect (meaning this test can be used) for the duration of the COVID-19 declaration under Section 564(b)(1) of the Act, 21 U.S.C. section 360bbb-3(b)(1), unless the authorization is terminated or revoked sooner.  Performed at [facility], [address] [place], [place] [address]   Culture, blood (routine x 2)     Status: None   Collection Time: 04/03/20  5:23 PM   Specimen: BLOOD  Result Value Ref Range Status   Specimen Description BLOOD  Final   Special Requests NONE  Final   Culture   Final    NO GROWTH 5 DAYS Performed at [facility], [address] [place], [place] [address]    Report Status 04/08/2020 FINAL  Final  Culture, blood (routine x 2)     Status: Abnormal   Collection Time: 04/03/20  5:24 PM   Specimen: BLOOD LEFT HAND  Result Value Ref Range Status   Specimen Description   Final    BLOOD LEFT HAND Performed at [facility], [place] [address], [place], [place] [address]    Special Requests   Final    NONE Performed at [facility], [address] [place], [place] [address]    Culture  Setup Time   Final    GRAM POSITIVE COCCI AEROBIC BOTTLE ONLY CRITICAL RESULT CALLED TO, READ BACK BY AND VERIFIED WITH: ABBY ELLINGTON 04/04/20 AT 1750 BY AR    Culture (A)  Final    STAPHYLOCOCCUS CAPITIS THE SIGNIFICANCE OF ISOLATING THIS ORGANISM FROM A SINGLE SET OF BLOOD CULTURES WHEN MULTIPLE SETS ARE DRAWN IS UNCERTAIN. PLEASE NOTIFY THE MICROBIOLOGY DEPARTMENT WITHIN ONE WEEK IF SPECIATION AND SENSITIVITIES ARE REQUIRED. Performed at [facility], [place] [address], [place],  [address]    Report Status 04/05/2020 FINAL  Final  Blood Culture ID Panel (Reflexed)  Status: Abnormal   Collection Time: 04/03/20  5:24 PM  Result Value Ref Range  Status   Enterococcus species NOT DETECTED NOT DETECTED Final   Listeria monocytogenes NOT DETECTED NOT DETECTED Final   Staphylococcus species DETECTED (A) NOT DETECTED Final    Comment: Methicillin (oxacillin) susceptible coagulase negative staphylococcus. Possible blood culture contaminant (unless isolated from more than one blood culture draw or clinical case suggests pathogenicity). No antibiotic treatment is indicated for blood  culture contaminants. CRITICAL RESULT CALLED TO, READ BACK BY AND VERIFIED WITH: ABBY ELLINGTON 04/04/20 @1730  BY ACR    Staphylococcus aureus (BCID) NOT DETECTED NOT DETECTED Final   Methicillin resistance NOT DETECTED NOT DETECTED Final   Streptococcus species NOT DETECTED NOT DETECTED Final   Streptococcus agalactiae NOT DETECTED NOT DETECTED Final   Streptococcus pneumoniae NOT DETECTED NOT DETECTED Final   Streptococcus pyogenes NOT DETECTED NOT DETECTED Final   Acinetobacter baumannii NOT DETECTED NOT DETECTED Final   Enterobacteriaceae species NOT DETECTED NOT DETECTED Final   Enterobacter cloacae complex NOT DETECTED NOT DETECTED Final   Escherichia coli NOT DETECTED NOT DETECTED Final   Klebsiella oxytoca NOT DETECTED NOT DETECTED Final   Klebsiella pneumoniae NOT DETECTED NOT DETECTED Final   Proteus species NOT DETECTED NOT DETECTED Final   Serratia marcescens NOT DETECTED NOT DETECTED Final   Haemophilus influenzae NOT DETECTED NOT DETECTED Final   Neisseria meningitidis NOT DETECTED NOT DETECTED Final   Pseudomonas aeruginosa NOT DETECTED NOT DETECTED Final   Candida albicans NOT DETECTED NOT DETECTED Final   Candida glabrata NOT DETECTED NOT DETECTED Final   Candida krusei NOT DETECTED NOT DETECTED Final   Candida parapsilosis NOT DETECTED NOT DETECTED Final   Candida tropicalis NOT DETECTED NOT DETECTED Final    Comment: Performed at [facility], [address], [place], [place] [address]  Urine Culture     Status: None    Collection Time: 04/05/20 11:50 AM   Specimen: Urine, Random  Result Value Ref Range Status   Specimen Description   Final    URINE, RANDOM Performed at [facility], [address] [place], [place] [address]    Special Requests   Final    NONE Performed at [facility], [address], [place], [place] [address]    Culture   Final    NO GROWTH Performed at [facility], [address], [place], [place] [address]    Report Status 04/06/2020 FINAL  Final  CULTURE, BLOOD (ROUTINE X 2) w Reflex to ID Panel     Status: None   Collection Time: 04/05/20  1:17 PM   Specimen: BLOOD  Result Value Ref Range Status   Specimen Description BLOOD RAC  Final   Special Requests   Final    BOTTLES DRAWN AEROBIC AND ANAEROBIC Blood Culture adequate volume   Culture   Final    NO GROWTH 5 DAYS Performed at [facility], [address] [place], [place] [address]    Report Status 04/10/2020 FINAL  Final  CULTURE, BLOOD (ROUTINE X 2) w Reflex to ID Panel     Status: None   Collection Time: 04/05/20  1:21 PM   Specimen: BLOOD  Result Value Ref Range Status   Specimen Description BLOOD LAC  Final   Special Requests   Final    BOTTLES DRAWN AEROBIC AND ANAEROBIC Blood Culture adequate volume   Culture   Final    NO GROWTH 5 DAYS Performed at [facility], [address] [place], [place] [address]    Report Status 04/10/2020 FINAL  Final  MRSA PCR Screening     Status: None   Collection Time: 04/05/20  2:45 PM   Specimen: Nasopharyngeal  Result Value Ref Range Status   MRSA by PCR NEGATIVE NEGATIVE Final    Comment:        The GeneXpert MRSA Assay (FDA approved for NASAL specimens only), is one component of a comprehensive MRSA colonization surveillance program. It is not intended to diagnose MRSA infection nor to guide or monitor treatment for MRSA infections. Performed at [facility], [address] [place], [place]  [address]   Respiratory Panel by PCR     Status: None   Collection Time: 04/05/20  2:45 PM   Specimen: Nasopharyngeal Swab; Respiratory  Result Value Ref Range Status   Adenovirus NOT DETECTED NOT DETECTED Final   Coronavirus 229E NOT DETECTED NOT DETECTED Final    Comment: (NOTE) The Coronavirus on the Respiratory Panel, DOES NOT test for the novel  Coronavirus (2019 nCoV)    Coronavirus HKU1 NOT DETECTED NOT DETECTED Final   Coronavirus NL63 NOT DETECTED NOT DETECTED Final   Coronavirus OC43 NOT DETECTED NOT DETECTED Final   Metapneumovirus NOT DETECTED NOT DETECTED Final   Rhinovirus / Enterovirus NOT DETECTED NOT DETECTED Final   Influenza A NOT DETECTED NOT DETECTED Final   Influenza B NOT DETECTED NOT DETECTED Final   Parainfluenza Virus 1 NOT DETECTED NOT DETECTED Final   Parainfluenza Virus 2 NOT DETECTED NOT DETECTED Final   Parainfluenza Virus 3 NOT DETECTED NOT DETECTED Final   Parainfluenza Virus 4 NOT DETECTED NOT DETECTED Final   Respiratory Syncytial Virus NOT DETECTED NOT DETECTED Final   Bordetella pertussis NOT DETECTED NOT DETECTED Final   Chlamydophila pneumoniae NOT DETECTED NOT DETECTED Final   Mycoplasma pneumoniae NOT DETECTED NOT DETECTED Final    Comment: Performed at [facility], [place]. [address] [place], [place] [address]  Expectorated sputum assessment w rflx to resp cult     Status: None   Collection Time: 04/05/20  5:50 PM   Specimen: Sputum  Result Value Ref Range Status   Specimen Description SPUTUM  Final   Special Requests NONE  Final   Sputum evaluation   Final    THIS SPECIMEN IS ACCEPTABLE FOR SPUTUM CULTURE Performed at [facility], [address], [place], [place] [address]    Report Status 04/06/2020 FINAL  Final  Culture, respiratory     Status: None   Collection Time: 04/05/20  5:50 PM   Specimen: SPU  Result Value Ref Range Status   Specimen Description   Final    SPUTUM Performed at [facility], [address] [place], [place] [address]    Special Requests   Final    NONE Reflexed from [id number] [phone] Performed at [facility], [address] [place], [place] [address]    Gram Stain   Final    RARE WBC PRESENT, PREDOMINANTLY MONONUCLEAR RARE SQUAMOUS EPITHELIAL CELLS PRESENT NO ORGANISMS SEEN Performed at [facility], [place] [address] [place],  [address]    Culture   Final    Consistent with normal respiratory flora. FUNGUS (MOLD) ISOLATED, PROBABLE CONTAMINANT/COLONIZER (SAPROPHYTE). CONTACT MICROBIOLOGY IF FURTHER IDENTIFICATION REQUIRED [phone].    Report Status 04/08/2020 FINAL  Final         Radiology Studies: DG Chest Port 1 View  Result Date: 04/10/2020 CLINICAL DATA:  Respiratory failure EXAM: PORTABLE CHEST 1 VIEW COMPARISON:  04/05/2020, 04/07/2020 FINDINGS: Right-sided chest port remains in place. Heart size is normal. Atherosclerotic calcification of the aortic knob. Masslike thickening of the lower right paratracheal stripe compatible with known mediastinal lymphadenopathy. Right middle lobe atelectasis/consolidation. Nodular opacities in the right mid lung. Streaky left basilar atelectasis. Emphysematous changes to the lung bases. No large pleural fluid collection. No pneumothorax. IMPRESSION: 1. No appreciable interval change from prior. Persistent right middle lobe atelectasis/consolidation. 2. Nodular opacities in the right mid lung, as described on recent CT. 3. Mediastinal lymphadenopathy. Electronically Signed   By: Davina Poke D.O.   On: 04/10/2020 08:19        Scheduled Meds: . amLODipine  5 mg Oral Daily  . atorvastatin  20 mg Oral QHS  . Chlorhexidine Gluconate Cloth  6 each Topical Daily  . cyclobenzaprine  10 mg Oral BID  . enoxaparin (LOVENOX) injection  40 mg Subcutaneous Q24H  . furosemide  20 mg Intravenous Daily  . guaiFENesin  600 mg Oral BID  . ipratropium-albuterol  3 mL Nebulization TID  . pantoprazole  40 mg  Oral Daily  . predniSONE  50 mg Oral Q breakfast   Continuous Infusions: . sodium chloride Stopped (04/08/20 1900)     LOS: 7 days    Time spent: 30 minutes    Barb Merino, MD [facility] Pager [phone]

## 2020-04-11 DIAGNOSIS — Z7189 Other specified counseling: Secondary | ICD-10-CM

## 2020-04-11 DIAGNOSIS — Z515 Encounter for palliative care: Secondary | ICD-10-CM

## 2020-04-11 LAB — BASIC METABOLIC PANEL
Anion gap: 14 (ref 5–15)
BUN: 44 mg/dL — ABNORMAL HIGH (ref 8–23)
CO2: 31 mmol/L (ref 22–32)
Calcium: 10.2 mg/dL (ref 8.9–10.3)
Chloride: 91 mmol/L — ABNORMAL LOW (ref 98–111)
Creatinine, Ser: 0.79 mg/dL (ref 0.44–1.00)
GFR calc Af Amer: >60 mL/min (ref >60)
GFR calc non Af Amer: >60 mL/min (ref >60)
Glucose, Bld: 74 mg/dL (ref 70–99)
Potassium: 4.5 mmol/L (ref 3.5–5.1)
Sodium: 136 mmol/L (ref 135–145)

## 2020-04-11 MED ORDER — PREDNISONE 20 MG PO TABS
40.0000 mg | ORAL_TABLET | Freq: Every day | ORAL | Status: DC
  Administered 2020-04-12: 40 mg via ORAL
  Filled 2020-04-11: qty 2

## 2020-04-11 NOTE — Progress Notes (Signed)
Physical Therapy Treatment Patient Details Name: Carol Gay MRN: [id number] DOB: 07-May-1946 Today's Date: 04/11/2020    History of Present Illness Pt admitted for COPD exacerbation with complaints of SOB symptoms. History includes COPD on 6L of O2, HTN, HLD, asthma, COPD, and colon cancer.    PT Comments    Pt was long sitting in bed upon arriving on 6 L HFNC. She is very alert and oriented. Therapist discussed pt's home situation and pt reports she has been dealing with respiratory status deficits for awhile. Pt very eager to return home and see her dog. Lengthy discussion about concerns however pt still wants to return home at DC and continue with HHPT like she was prior to admit. Pt eager to trial OOB activity. She required no physical assistance to exit bed or stand however de-sats quickly to 77%  with elevated RR with minimal activity. Pt required increased O2 supply to recover. Therapist increased HFNC to 13 L x 3 minutes prior to pt recovering and being able to return to 6L. Pt reports she does have pulse oximeter at home and does regularly check throughout the day. Therapist feel pt can return home safely if respiratory status improves. At conclusion of session, pt was seated in recliner with BLEs elevated, chair alarm in place, call bell in reach, and RN aware of pt's abilties and response to activity. Acute PT will continue to follow.    Follow Up Recommendations  Home health PT;Supervision - Intermittent     Equipment Recommendations  None recommended by PT    Recommendations for Other Services       Precautions / Restrictions Precautions Precautions: Fall (O2) Precaution Comments: desats quickly with minimal activity on 6 L HFNC Restrictions Weight Bearing Restrictions: No    Mobility  Bed Mobility Overal bed mobility: Modified Independent             General bed mobility comments: pt is able to exit and return to long sit in bed without physical  assistance  Transfers Overall transfer level: Needs assistance Equipment used: Rolling walker (2 wheeled) Transfers: Sit to/from Stand Sit to Stand: Min guard         General transfer comment: CGA for safety only. no physical assistance required  Ambulation/Gait Ambulation/Gait assistance: Min guard Gait Distance (Feet): 5 Feet Assistive device: Rolling walker (2 wheeled) Gait Pattern/deviations: Step-to pattern Gait velocity: decreased   General Gait Details: gait distances limited by RR/sao2 desat not strength. Pt desats to 77% quickly in standing even with HFNC increased.   Stairs             Wheelchair Mobility    Modified Rankin (Stroke Patients Only)       Balance Overall balance assessment: Modified Independent Sitting-balance support: Feet unsupported;No upper extremity supported Sitting balance-Leahy Scale: Normal Sitting balance - Comments: no LOB in sitting at EOB   Standing balance support: Bilateral upper extremity supported;During functional activity Standing balance-Leahy Scale: Good Standing balance comment: no LOB in standing                            Cognition Arousal/Alertness: Awake/alert Behavior During Therapy: WFL for tasks assessed/performed Overall Cognitive Status: Within Functional Limits for tasks assessed                                 General Comments: Pt is A and O x  4 and good historian       Exercises      General Comments        Pertinent Vitals/Pain Pain Assessment: No/denies pain    Home Living                      Prior Function            PT Goals (current goals can now be found in the care plan section) Acute Rehab PT Goals Patient Stated Goal: to return home and care for her dog  Progress towards PT goals: Progressing toward goals    Frequency    Min 2X/week      PT Plan Current plan remains appropriate    Co-evaluation              AM-PAC PT "6  Clicks" Mobility   Outcome Measure  Help needed turning from your back to your side while in a flat bed without using bedrails?: None Help needed moving from lying on your back to sitting on the side of a flat bed without using bedrails?: A Little Help needed moving to and from a bed to a chair (including a wheelchair)?: A Little Help needed standing up from a chair using your arms (e.g., wheelchair or bedside chair)?: A Little Help needed to walk in hospital room?: A Little Help needed climbing 3-5 steps with a railing? : A Lot 6 Click Score: 18    End of Session Equipment Utilized During Treatment: Oxygen;Gait belt (HFNC) Activity Tolerance: Patient limited by fatigue;Treatment limited secondary to medical complications (Comment) (pt's inability to tolerace OOB activity -desaturation/SOB)     PT Visit Diagnosis: Muscle weakness (generalized) (M62.81);Other abnormalities of gait and mobility (R26.89)     Time: 7903-8333 PT Time Calculation (min) (ACUTE ONLY): 16 min  Charges:  $Therapeutic Activity: 8-22 mins                     Julaine Fusi PTA 04/11/20, 5:14 PM

## 2020-04-11 NOTE — Progress Notes (Signed)
Pulmonary Medicine          Date: 04/11/2020,   MRN# [id number] Carol Gay [birth year]     AdmissionWeight: 77.3 kg                 CurrentWeight: 76.7 kg   Referring physician: Dr Jimmye Norman    CHIEF COMPLAINT:   Acute on chronic hypoxemic respiratory failure   SUBJECTIVE   Patient weaned to 6L/mmin on HFNC.  She is speaking in full sentences but does desaturate to 87%.  Plan to continue current care.     PAST MEDICAL HISTORY   Past Medical History:  Diagnosis Date  . Aortic atherosclerosis ([place])   . Arm pain   . Asthma   . Benign neoplasm of rectosigmoid junction   . Benign neoplasm of transverse colon   . CAD in native artery   . Cancer ([place]) 09/2015   rectum  . Cigarette smoker   . Claustrophobia   . Colon cancer ([place])   . 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 COPD (chronic obstructive pulmonary disease) ([place])   . DDD (degenerative disc disease), lumbar       . Dermatophytoses   . Dyslipidemia   . H/O blood clots    left leg, veins stripped  . Hemorrhoids   . Hypertension   . Last menstrual period (LMP) > 10 days ago 1994  . Myocardial infarction [facility])    "mild" - age 57  . Personal history of chemotherapy   . Personal history of radiation therapy   . Pulmonary emphysema ([place])   . Rectal bleeding   . Shortness of breath dyspnea   . Smokers' cough ([place])   . Spinal headache    with one of my children's birth  . Vertigo   . Wears dentures    has full upper and lower - doesn't wear     SURGICAL HISTORY   Past Surgical History:  Procedure Laterality Date  . APPENDECTOMY    . BREAST BIOPSY Left 1970's   neg  . COLONOSCOPY WITH PROPOFOL N/A 11/06/2015   Procedure: COLONOSCOPY WITH PROPOFOL;  Surgeon: Lucilla Lame, MD;  Location: [place];  Service: Endoscopy;  Laterality: N/A;  LEAVE PT EARLY  . EXPLORATORY LAPAROTOMY    . LESION EXCISION  11/03/15   Anal - Dr Dahlia Byes, [facility]  . LESION  REMOVAL  1970's   from tailbone  . [place] SURGERY  1997  . OVARIAN CYST REMOVAL Left   . POLYPECTOMY  11/06/2015   Procedure: POLYPECTOMY;  Surgeon: Lucilla Lame, MD;  Location: [place];  Service: Endoscopy;;  . PORTACATH PLACEMENT N/A 12/10/2015   Procedure: INSERTION PORT-A-CATH;  Surgeon: Jules Husbands, MD;  Location: [facility];  Service: General;  Laterality: N/A;  . RECTAL EXAM UNDER ANESTHESIA N/A 06/23/2016   Procedure: RECTAL EXAM UNDER ANESTHESIA;  Surgeon: Jules Husbands, MD;  Location: [facility];  Service: General;  Laterality: N/A;  . VEIN LIGATION AND STRIPPING       FAMILY HISTORY   Family History  Problem Relation Age of Onset  . Cancer Mother        lymphoma  . Heart disease Mother   . Cancer Maternal Grandmother        breast  . Birth defects Maternal Grandmother      SOCIAL HISTORY   Social History   Tobacco Use  . Smoking status: Former  Smoker    Packs/day: 1.00    Years: 57.00    Pack years: 57.00    Types: Cigarettes    Quit date: 03/30/2016    Years since quitting: 4.0  . Smokeless tobacco: Never Used  . Tobacco comment: Quit in July 2017  Vaping Use  . Vaping Use: Never used  Substance Use Topics  . Alcohol use: No    Alcohol/week: 0.0 standard drinks  . Drug use: No     MEDICATIONS    Home Medication:    Current Medication:  Current Facility-Administered Medications:  .  0.9 %  sodium chloride infusion, , Intravenous, PRN, Wyvonnia Dusky, MD, Stopped at 04/08/20 1900 .  acetaminophen (TYLENOL) tablet 650 mg, 650 mg, Oral, Q6H PRN, 650 mg at 04/11/20 1356 **OR** acetaminophen (TYLENOL) suppository 650 mg, 650 mg, Rectal, Q6H PRN, Mansy, Jan A, MD .  amLODipine (NORVASC) tablet 5 mg, 5 mg, Oral, Daily, Mansy, Jan A, MD, 5 mg at 04/11/20 1000 .  atorvastatin (LIPITOR) tablet 20 mg, 20 mg, Oral, QHS, Mansy, Jan A, MD, 20 mg at 04/10/20 2130 .  calcium carbonate (TUMS - dosed in mg elemental calcium) chewable tablet 200 mg  of elemental calcium, 1 tablet, Oral, TID PRN, Mansy, Jan A, MD, 200 mg of elemental calcium at 04/09/20 2140 .  Chlorhexidine Gluconate Cloth 2 % PADS 6 each, 6 each, Topical, Daily, Wyvonnia Dusky, MD, 6 each at 04/10/20 1440 .  cyclobenzaprine (FLEXERIL) tablet 10 mg, 10 mg, Oral, BID, Mansy, Jan A, MD, 10 mg at 04/11/20 0959 .  enoxaparin (LOVENOX) injection 40 mg, 40 mg, Subcutaneous, Q24H, Mansy, Jan A, MD, 40 mg at 04/10/20 2130 .  furosemide (LASIX) injection 40 mg, 40 mg, Intravenous, Daily, Lanney Gins, Clinton Dragone, MD, 40 mg at 04/11/20 1000 .  guaiFENesin (MUCINEX) 12 hr tablet 600 mg, 600 mg, Oral, BID, Mansy, Jan A, MD, 600 mg at 04/11/20 1000 .  guaiFENesin-dextromethorphan (ROBITUSSIN DM) 100-10 MG/5ML syrup 5 mL, 5 mL, Oral, Q4H PRN, Mansy, Jan A, MD .  hydrOXYzine (ATARAX/VISTARIL) tablet 25 mg, 25 mg, Oral, BID PRN, Mansy, Jan A, MD .  ipratropium-albuterol (DUONEB) 0.5-2.5 (3) MG/3ML nebulizer solution 3 mL, 3 mL, Nebulization, TID, Wyvonnia Dusky, MD, 3 mL at 04/11/20 1314 .  labetalol (NORMODYNE) injection 20 mg, 20 mg, Intravenous, Q3H PRN, Mansy, Jan A, MD .  levalbuterol Penne Lash) nebulizer solution 0.63 mg, 0.63 mg, Nebulization, Q6H PRN, Mansy, Jan A, MD, 0.63 mg at 04/05/20 1411 .  magnesium hydroxide (MILK OF MAGNESIA) suspension 30 mL, 30 mL, Oral, Daily PRN, Mansy, Jan A, MD .  ondansetron (ZOFRAN) tablet 4 mg, 4 mg, Oral, Q6H PRN **OR** ondansetron (ZOFRAN) injection 4 mg, 4 mg, Intravenous, Q6H PRN, Mansy, Jan A, MD, 4 mg at 04/06/20 2131 .  pantoprazole (PROTONIX) EC tablet 40 mg, 40 mg, Oral, Daily, Barb Merino, MD, 40 mg at 04/11/20 0959 .  predniSONE (DELTASONE) tablet 50 mg, 50 mg, Oral, Q breakfast, Jalaina Salyers, MD, 50 mg at 04/11/20 0959 .  traZODone (DESYREL) tablet 25 mg, 25 mg, Oral, QHS PRN, Mansy, Jan A, MD, 25 mg at 04/10/20 2140  Facility-Administered Medications Ordered in Other Encounters:  .  albuterol (PROVENTIL) (2.5 MG/3ML) 0.083%  nebulizer solution 2.5 mg, 2.5 mg, Nebulization, Once, Laverle Hobby, MD    ALLERGIES   Bee venom, Aspirin, Codeine, Morphine and related, and Cat hair extract     REVIEW OF SYSTEMS    Review of Systems:  Gen:  Denies  fever, sweats, chills weigh loss  HEENT: Denies blurred vision, double vision, ear pain, eye pain, hearing loss, nose bleeds, sore throat Cardiac:  No dizziness, chest pain or heaviness, chest tightness,edema Resp: reports cough or sputum porduction, shortness of breath,wheezing, hemoptysis,  Gi: Denies swallowing difficulty, stomach pain, nausea or vomiting, diarrhea, constipation, bowel incontinence Gu:  Denies bladder incontinence, burning urine Ext:   Denies Joint pain, stiffness or swelling Skin: Denies  skin rash, easy bruising or bleeding or hives Endoc:  Denies polyuria, polydipsia , polyphagia or weight change Psych:   Denies depression, insomnia or hallucinations   Other:  All other systems negative   VS: BP (!) 140/76 (BP Location: Right Arm)   Pulse 90   Temp 97.6 F (36.4 C) (Oral)   Resp 20   Ht 5\' 2"  (1.575 m)   Wt 76.7 kg   SpO2 99%   BMI 30.91 kg/m      PHYSICAL EXAM    GENERAL:NAD, no fevers, chills, no weakness no fatigue HEAD: Normocephalic, atraumatic.  EYES: Pupils equal, round, reactive to light. Extraocular muscles intact. No scleral icterus.  MOUTH: Moist mucosal membrane. Dentition intact. No abscess noted.  EAR, NOSE, THROAT: Clear without exudates. No external lesions.  NECK: Supple. No thyromegaly. No nodules. No JVD.  PULMONARY: Diffuse coarse rhonchi right sided +wheezes CARDIOVASCULAR: S1 and S2. Regular rate and rhythm. No murmurs, rubs, or gallops. No edema. Pedal pulses 2+ bilaterally.  GASTROINTESTINAL: Soft, nontender, nondistended. No masses. Positive bowel sounds. No hepatosplenomegaly.  MUSCULOSKELETAL: No swelling, clubbing, or edema. Range of motion full in all extremities.  NEUROLOGIC: Cranial  nerves II through XII are intact. No gross focal neurological deficits. Sensation intact. Reflexes intact.  SKIN: No ulceration, lesions, rashes, or cyanosis. Skin warm and dry. Turgor intact.  PSYCHIATRIC: Mood, affect within normal limits. The patient is awake, alert and oriented x 3. Insight, judgment intact.       IMAGING    DG Chest 1 View  Result Date: 04/03/2020 CLINICAL DATA:  Shortness of breath EXAM: CHEST  1 VIEW COMPARISON:  01/28/2020 FINDINGS: Right-sided chest port in stable positioning. Heart size is normal. Atherosclerotic calcification of the aortic knob. Interval development of patchy and interstitial opacities within the mid to lower aspect of the right lung. Left lung is hyperexpanded and clear. Scattered granulomas are unchanged. No pleural effusion or pneumothorax identified. IMPRESSION: Interval development of patchy and interstitial opacities within the mid to lower aspect of the right lung. Findings suspicious for pneumonia. Radiographic follow-up to resolution is recommended. Electronically Signed   By: Davina Poke D.O.   On: 04/03/2020 16:24   CT CHEST W CONTRAST  Result Date: 04/07/2020 CLINICAL DATA:  Progressive hypoxemia EXAM: CT CHEST WITH CONTRAST TECHNIQUE: Multidetector CT imaging of the chest was performed during intravenous contrast administration. CONTRAST:  20mL OMNIPAQUE IOHEXOL 300 MG/ML  SOLN COMPARISON:  CT 07/02/2016, radiograph 04/05/2020 FINDINGS: Cardiovascular: Cardiac size at the upper limits of normal. Trace pericardial thickening similar to priors. Coronary artery calcifications are present. Dense calcifications present at the aortic root and throughout the thoracic aorta. Aorta is normal caliber. No acute luminal abnormality nor periaortic stranding or hemorrhage. Normal 3 vessel branching of the aortic arch. Extensive calcification of the proximal great vessels. Central pulmonary arteries are borderline enlarged. No large central or lobar  filling defects are seen on this limited, non tailored examination of the pulmonary arteries. Right IJ approach Port-A-Cath tip terminates at the lower SVC. Mediastinum/Nodes: Mediastinal and hilar adenopathy: Index  nodes include pretracheal lymph node measuring up to 2.5 cm in size (2/48), 13 mm right hilar lymph node (2/74), 10 mm subcarinal lymph node (2/69). No acute abnormality of the trachea or esophagus. Slight rightward mediastinal shift likely related to volume loss in the right hemithorax. Thyroid gland and thoracic inlet are unremarkable. Lungs/Pleura: Extensive centrilobular and paraseptal emphysematous changes throughout the lungs. Chronic areas of subpleural reticulation and fibrotic change most pronounced in the right hemithorax. There is an irregular, spiculated nodule with some central cavitation measuring up to 2 x 2 x 1.3 cm in size (2/63). A more dense area of consolidated lung and volume loss with underlying heterogeneous enhancement is noted in the right middle lobe along the paramediastinal border with some associated air bronchograms more centrally. No other concerning pulmonary nodules or masses. Scattered calcified granulomata are noted throughout both lungs. Chronic area of scarring in the left lung base with some likely adjacent subsegmental atelectasis. Upper Abdomen: Right renal scarring. No acute abnormalities present in the visualized portions of the upper abdomen. Musculoskeletal: No acute osseous abnormality or suspicious osseous lesion. Degenerative changes in the spine and shoulders. No suspicious chest wall lesions. IMPRESSION: 1. Irregular, spiculated nodule with some central cavitation in the right upper lobe measuring up to 2 cm in size, concerning for primary bronchogenic malignancy or less likely metastasis. Cavitary pneumonia is less favored given the additional findings in the chest. 2. More dense area of consolidated lung and volume loss with underlying heterogeneous  enhancement in the right middle lobe along the paramediastinal border with some associated air bronchograms more centrally. Certainly could reflect an acute consolidative airspace process/pneumonia though underlying mass lesion is not excluded. 3. Mediastinal and hilar adenopathy, concerning for metastatic disease. 4. Borderline enlarged central pulmonary arteries, which can be seen with pulmonary arterial hypertension. 5. Aortic Atherosclerosis (ICD10-I70.0) 6. Emphysema (ICD10-J43.9). These results will be called to the ordering clinician or representative by the Radiologist Assistant, and communication documented in the PACS or [employer]. Elevated Electronically Signed   By: Lovena Le M.D.   On: 04/07/2020 19:37   DG Chest Port 1 View  Result Date: 04/10/2020 CLINICAL DATA:  Respiratory failure EXAM: PORTABLE CHEST 1 VIEW COMPARISON:  04/05/2020, 04/07/2020 FINDINGS: Right-sided chest port remains in place. Heart size is normal. Atherosclerotic calcification of the aortic knob. Masslike thickening of the lower right paratracheal stripe compatible with known mediastinal lymphadenopathy. Right middle lobe atelectasis/consolidation. Nodular opacities in the right mid lung. Streaky left basilar atelectasis. Emphysematous changes to the lung bases. No large pleural fluid collection. No pneumothorax. IMPRESSION: 1. No appreciable interval change from prior. Persistent right middle lobe atelectasis/consolidation. 2. Nodular opacities in the right mid lung, as described on recent CT. 3. Mediastinal lymphadenopathy. Electronically Signed   By: Davina Poke D.O.   On: 04/10/2020 08:19   DG Chest Port 1 View  Result Date: 04/05/2020 CLINICAL DATA:  74 year old female with a history of shortness of breath EXAM: PORTABLE CHEST 1 VIEW COMPARISON:  04/03/2020 FINDINGS: Cardiomediastinal silhouette unchanged. Reticulonodular opacities of the right greater than left lung, unchanged from the comparison plain  film. No pneumothorax. No pleural effusion. Unchanged right IJ port catheter. IMPRESSION: Similar appearance of the chest x-ray with right greater than left reticulonodular opacities, potentially multifocal infection. Unchanged right IJ port catheter. Electronically Signed   By: Corrie Mckusick D.O.   On: 04/05/2020 15:27   ECHOCARDIOGRAM COMPLETE  Result Date: 04/05/2020    ECHOCARDIOGRAM REPORT   Patient Name:   BERANIA  Madaline Brilliant Date of Exam: 04/05/2020 Medical Rec #:  [id number]        Height:       62.0 in Accession #:    [id number]       Weight:       173.7 lb Date of Birth:  December 24, 1945        BSA:          1.801 m Patient Age:    43 years         BP:           143/68 mmHg Patient Gender: F                HR:           100 bpm. Exam Location:  [facility] Procedure: 2D Echo Indications:     Bacteremia  History:         Patient has no prior history of Echocardiogram examinations.                  Advanced Stage COPD; Risk Factors:Current Smoker and                  Hypertension.  Sonographer:     L Thornton-Maynard Referring Phys:  [id number] Wyvonnia Dusky Diagnosing Phys: Ida Rogue MD  Sonographer Comments: Image acquisition challenging due to COPD. IMPRESSIONS  1. Left ventricular ejection fraction, by estimation, is 55 to 60%. The left ventricle has normal function. The left ventricle has no regional wall motion abnormalities. There is mild left ventricular hypertrophy. Left ventricular diastolic parameters are consistent with Grade I diastolic dysfunction (impaired relaxation).  2. Right ventricular systolic function is mildly reduced. The right ventricular size is moderately enlarged. There is severely elevated pulmonary artery systolic pressure. The estimated right ventricular systolic pressure is 53.9 mmHg.  3. No valve vegetation noted. FINDINGS  Left Ventricle: Left ventricular ejection fraction, by estimation, is 55 to 60%. The left ventricle has normal function. The left ventricle has no regional wall  motion abnormalities. The left ventricular internal cavity size was normal in size. There is  mild left ventricular hypertrophy. Left ventricular diastolic parameters are consistent with Grade I diastolic dysfunction (impaired relaxation). Right Ventricle: The right ventricular size is moderately enlarged. No increase in right ventricular wall thickness. Right ventricular systolic function is mildly reduced. There is severely elevated pulmonary artery systolic pressure. The tricuspid regurgitant velocity is 4.48 m/s, and with an assumed right atrial pressure of 10 mmHg, the estimated right ventricular systolic pressure is 76.7 mmHg. Left Atrium: Left atrial size was normal in size. Right Atrium: Right atrial size was normal in size. Pericardium: There is no evidence of pericardial effusion. Mitral Valve: The mitral valve is normal in structure. Normal mobility of the mitral valve leaflets. Mild mitral valve regurgitation. No evidence of mitral valve stenosis. MV peak gradient, 5.4 mmHg. The mean mitral valve gradient is 3.0 mmHg. Tricuspid Valve: The tricuspid valve is normal in structure. Tricuspid valve regurgitation is mild . No evidence of tricuspid stenosis. Aortic Valve: The aortic valve was not well visualized. Aortic valve regurgitation is not visualized. No aortic stenosis is present. Aortic valve mean gradient measures 5.0 mmHg. Aortic valve peak gradient measures 7.0 mmHg. Aortic valve area, by VTI measures 1.31 cm. Pulmonic Valve: The pulmonic valve was normal in structure. Pulmonic valve regurgitation is not visualized. No evidence of pulmonic stenosis. Aorta: The aortic root is normal in size and structure. Venous: The inferior vena cava is normal  in size with less than 50% respiratory variability, suggesting right atrial pressure of 8 mmHg. IAS/Shunts: No atrial level shunt detected by color flow Doppler.  LEFT VENTRICLE PLAX 2D LVIDd:         3.98 cm  Diastology LVIDs:         2.65 cm  LV e'  lateral:   6.31 cm/s LV PW:         1.61 cm  LV E/e' lateral: 12.4 LV IVS:        1.55 cm  LV e' medial:    5.33 cm/s LVOT diam:     1.70 cm  LV E/e' medial:  14.7 LV SV:         38 LV SV Index:   21 LVOT Area:     2.27 cm  RIGHT VENTRICLE RV S prime:     14.60 cm/s TAPSE (M-mode): 1.8 cm LEFT ATRIUM         Index LA diam:    3.50 cm 1.94 cm/m  AORTIC VALVE AV Area (Vmax):    1.47 cm AV Area (Vmean):   1.25 cm AV Area (VTI):     1.31 cm AV Vmax:           132.00 cm/s AV Vmean:          104.000 cm/s AV VTI:            0.292 m AV Peak Grad:      7.0 mmHg AV Mean Grad:      5.0 mmHg LVOT Vmax:         85.50 cm/s LVOT Vmean:        57.500 cm/s LVOT VTI:          0.168 m LVOT/AV VTI ratio: 0.58 MITRAL VALVE                TRICUSPID VALVE MV Area (PHT): 5.00 cm     TR Peak grad:   80.3 mmHg MV Peak grad:  5.4 mmHg     TR Vmax:        448.00 cm/s MV Mean grad:  3.0 mmHg MV Vmax:       1.16 m/s     SHUNTS MV Vmean:      83.0 cm/s    Systemic VTI:  0.17 m MV E velocity: 78.40 cm/s   Systemic Diam: 1.70 cm MV A velocity: 117.00 cm/s MV E/A ratio:  0.67 Ida Rogue MD Electronically signed by Ida Rogue MD Signature Date/Time: 04/05/2020/11:07:53 PM    Final              Media Information   Document Information  Photos    04/08/2020 17:22  Attached To:  Hospital Encounter on 04/03/20  Source Information  Ottie Glazier, MD  [facility]       ASSESSMENT/PLAN   Acute on chronic hypoxemic respiratory failure  - due to right lung community acquired pneumonia vs AECOPD due to malingancy  -bacterial/viral etiology workup is negative  -respiratory culture via expectorated sputum negative -Repeat blood culturues - 1 + staph capitis likely contaminant -hx of squamous stage 3 rectal CA- s/p chemoradiotherapy -RVP-negative -MRSA nasal -negative -Procalcitonin trend -Legionella and strep pneumo negative-Bnegative  -CXR reviewed as above with RLL infiltrate -Swallow evaluation  to r/o recurrent aspiration due to RLL inifiltrate - narrow Antimicrobial regimen post above workup  I have discussed CT chest findings with patient at length , she will need additional workup including MRI brain, outpatient  PET.  Plan is to continue to improve her respiratory status so she can go home and complete cancer workup.  Will stop Ancef today  Community acquired pneumonia  - present on admission   - etiology not clear at this time - possible viral/bacterial vs aspiration related as above  - Vanc/Cefepime >>Zithromax/Ancef>>full course completed  -Solumedrol >>pred  - s/p IVF-dc/d  -repeat blood cx -noted legionella/strep pneumo -RVP-negative  - repeat CXR in am  Reviewed ABG with hypoxemia without hypercapnia   Severe acute exacerbation of COPD  - agree with current COPD carepath  - continue to wean steroids-changing solumedrol to prednisone 40mg  today  - IS - I have encouraged patient to keep working with spirometer - currently tV at 850cc per breath  - c/w antibiotics  - DuoNEb therapy per RT   Chronic hypoxemia   - PT/OT and consider palliative care    - monitor diuresis-have diuresed >2200cc overnight, will decrease lasix to 20 daily   - metaneb for recruitment maneuvers       Thank you for allowing me to participate in the care of this patient.   Patient/Family are satisfied with care plan and all questions have been answered.   This document was prepared using Dragon voice recognition software and may include unintentional dictation errors.     Ottie Glazier, M.D.  Division of [place]

## 2020-04-11 NOTE — Consult Note (Signed)
Consultation Note Date: 04/11/2020  Patient Name: Carol Gay  DOB: 1946/08/18  MRN: [id number]  Age / Sex: 74 y.o., female  PCP: Glean Hess, MD Referring Physician: Barb Merino, MD  Reason for Consultation: Establishing goals of care  HPI/Patient Profile: Carol Gay a 74 y.o.femalewith medical history significant ofCOPD  who presents with shortness of breath.  Clinical Assessment and Goals of Care:  Patient was most recently evaluated by inpatient palliative medicine 01/2020 during admission for SOB.    Patient is currently sitting in bed. She lives alone and was widowed in 2003. She has 3 children that live out of state.   She states since her prior admission in May, she has been living at home, and her QOL has been acceptable.  She tells me since last discharge, she has been on 6 lpm of O2 24/7. She tells me prior to last admission, she was using 5 lpm of O2 24/7. She states her time is spent in the chair and the bed; she is ambulatory around the house without assistance. She states she has a nurse to come out to be there when she takes a bath. She tells me she has not had a good appetite.    We discussed her diagnosis, prognosis, GOC, EOL wishes disposition and options.  A detailed discussion was had today regarding advanced directives.  Concepts specific to code status, artifical feeding and hydration, IV antibiotics and rehospitalization were discussed.  The difference between an aggressive medical intervention path and a comfort care path was discussed.  Values and goals of care important to patient and family were attempted to be elicited.  Discussed limitations of medical interventions to prolong quality of life in some situations and discussed the concept of human mortality.  She tells me her children are very much aware of her wishes on health care. She confirms  DNR/DNI. She states she would like to continue to treat the treatable. She states she wants to return to the hospital if needed. She states if a time comes she is no longer able to perform ADL's independently or care for her dog, she will no longer consider that an adequate QOL, and would want to transition to hospice care and a comfort focus. She states she is waiting to see what the plans are for the nodule on her lung, and states outpatient scans and biopsies have been discussed.   Recommend continued outpatient palliative.     SUMMARY OF RECOMMENDATIONS    Continue outpatient palliative.    Prognosis:   Poor overall     Primary Diagnoses: Present on Admission: . Pneumonia   I have reviewed the medical record, interviewed the patient and family, and examined the patient. The following aspects are pertinent.  Past Medical History:  Diagnosis Date  . Aortic atherosclerosis ([place])   . Arm pain   . Asthma   . Benign neoplasm of rectosigmoid junction   . Benign neoplasm of transverse colon   . CAD in native artery   .  Cancer ([place]) 09/2015   rectum  . Cigarette smoker   . Claustrophobia   . Colon cancer ([place])   . 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 COPD (chronic obstructive pulmonary disease) ([place])   . DDD (degenerative disc disease), lumbar       . Dermatophytoses   . Dyslipidemia   . H/O blood clots    left leg, veins stripped  . Hemorrhoids   . Hypertension   . Last menstrual period (LMP) > 10 days ago 1994  . Myocardial infarction [facility])    "mild" - age 29  . Personal history of chemotherapy   . Personal history of radiation therapy   . Pulmonary emphysema ([place])   . Rectal bleeding   . Shortness of breath dyspnea   . Smokers' cough ([place])   . Spinal headache    with one of my children's birth  . Vertigo   . Wears dentures    has full upper and lower - doesn't wear   Social History   Socioeconomic History  . Marital status:  Widowed    Spouse name: Not on file  . Number of children: Not on file  . Years of education: Not on file  . Highest education level: Not on file  Occupational History  . Not on file  Tobacco Use  . Smoking status: Former Smoker    Packs/day: 1.00    Years: 57.00    Pack years: 57.00    Types: Cigarettes    Quit date: 03/30/2016    Years since quitting: 4.0  . Smokeless tobacco: Never Used  . Tobacco comment: Quit in July 2017  Vaping Use  . Vaping Use: Never used  Substance and Sexual Activity  . Alcohol use: No    Alcohol/week: 0.0 standard drinks  . Drug use: No  . Sexual activity: Not Currently  Other Topics Concern  . Not on file  Social History Narrative  . Not on file   Social Determinants of Health   Financial Resource Strain: Low Risk   . Difficulty of Paying Living Expenses: Not very hard  Food Insecurity: No Food Insecurity  . Worried About Charity fundraiser in the Last Year: Never true  . Ran Out of Food in the Last Year: Never true  Transportation Needs: Unmet Transportation Needs  . Lack of Transportation (Medical): Yes  . Lack of Transportation (Non-Medical): Yes  Physical Activity: Inactive  . Days of Exercise per Week: 0 days  . Minutes of Exercise per Session: 0 min  Stress: Stress Concern Present  . Feeling of Stress : To some extent  Social Connections: Unknown  . Frequency of Communication with Friends and Family: Patient refused  . Frequency of Social Gatherings with Friends and Family: Patient refused  . Attends Religious Services: Patient refused  . Active Member of Clubs or Organizations: Patient refused  . Attends Archivist Meetings: Patient refused  . Marital Status: Widowed   Family History  Problem Relation Age of Onset  . Cancer Mother        lymphoma  . Heart disease Mother   . Cancer Maternal Grandmother        breast  . Birth defects Maternal Grandmother    Scheduled Meds: . amLODipine  5 mg Oral Daily  .  atorvastatin  20 mg Oral QHS  . Chlorhexidine Gluconate Cloth  6 each Topical Daily  . cyclobenzaprine  10 mg Oral  BID  . enoxaparin (LOVENOX) injection  40 mg Subcutaneous Q24H  . furosemide  40 mg Intravenous Daily  . guaiFENesin  600 mg Oral BID  . ipratropium-albuterol  3 mL Nebulization TID  . pantoprazole  40 mg Oral Daily  . predniSONE  50 mg Oral Q breakfast   Continuous Infusions: . sodium chloride Stopped (04/08/20 1900)   PRN Meds:.sodium chloride, acetaminophen **OR** acetaminophen, calcium carbonate, guaiFENesin-dextromethorphan, hydrOXYzine, labetalol, levalbuterol, magnesium hydroxide, ondansetron **OR** ondansetron (ZOFRAN) IV, traZODone Medications Prior to Admission:  Prior to Admission medications   Medication Sig Start Date End Date Taking? Authorizing Provider  acetaminophen (TYLENOL) 500 MG tablet Take 2 tablets (1,000 mg total) by mouth 3 (three) times daily as needed. Patient taking differently: Take by mouth 3 (three) times daily as needed.  02/03/20  Yes Enzo Bi, MD  albuterol (VENTOLIN HFA) 108 (90 Base) MCG/ACT inhaler INHALE 2 PUFFS INTO THE LUNGS EVERY 6 HOURS AS NEEDED FOR WHEEZING OR SHORTNESS OF BREATH 02/03/20  Yes Enzo Bi, MD  amLODipine (NORVASC) 5 MG tablet TAKE 1 TABLET EVERY DAY Patient taking differently: Take 5 mg by mouth at bedtime.  07/16/19  Yes Glean Hess, MD  atorvastatin (LIPITOR) 20 MG tablet TAKE 1 TABLET AT BEDTIME 03/10/20  Yes Glean Hess, MD  calcium carbonate (TUMS - DOSED IN MG ELEMENTAL CALCIUM) 500 MG chewable tablet Chew 1 tablet (200 mg of elemental calcium total) by mouth 3 (three) times daily as needed for indigestion or heartburn. 02/03/20  Yes Enzo Bi, MD  cyclobenzaprine (FLEXERIL) 10 MG tablet Take 1 tablet (10 mg total) by mouth 2 (two) times daily. Patient taking differently: Take 10 mg by mouth 2 (two) times daily as needed.  03/03/20  Yes Glean Hess, MD  hydrOXYzine (ATARAX/VISTARIL) 25 MG tablet Take  25 mg by mouth 2 (two) times daily as needed.    Yes [provider]  ipratropium (ATROVENT) 0.02 % nebulizer solution Take 2.5 mLs (0.5 mg total) by nebulization 4 (four) times daily. 03/03/20  Yes Glean Hess, MD  ipratropium-albuterol (DUONEB) 0.5-2.5 (3) MG/3ML SOLN Take 3 mLs by nebulization 3 (three) times daily as needed. 02/03/20  Yes Enzo Bi, MD  levalbuterol Penne Lash) 0.63 MG/3ML nebulizer solution Take 3 mLs (0.63 mg total) by nebulization in the morning, at noon, in the evening, and at bedtime. 03/03/20  Yes Glean Hess, MD  omeprazole (PRILOSEC) 40 MG capsule Take 40 mg by mouth daily. 03/30/20  Yes [provider]  TRELEGY ELLIPTA 100-62.5-25 MCG/INH AEPB INHALE 1 PUFF INTO THE LUNGS DAILY. 11/12/19  Yes Glean Hess, MD   Allergies  Allergen Reactions  . Bee Venom Anaphylaxis  . Aspirin Nausea And Vomiting  . Codeine Nausea And Vomiting  . Morphine And Related Nausea And Vomiting  . Cat Hair Extract Other (See Comments)    Runny nose, watery eyes   Review of Systems  All other systems reviewed and are negative.   Physical Exam Pulmonary:     Effort: Pulmonary effort is normal.     Comments: O2 in place. Neurological:     Mental Status: She is alert.     Vital Signs: BP (!) 140/76 (BP Location: Right Arm)   Pulse 90   Temp 97.6 F (36.4 C) (Oral)   Resp 20   Ht 5\' 2"  (1.575 m)   Wt 76.7 kg   SpO2 99%   BMI 30.91 kg/m  Pain Scale: 0-10 POSS *See Group Information*: 1-Acceptable,Awake and  alert Pain Score: 0-No pain   SpO2: SpO2: 99 % O2 Device:SpO2: 99 % O2 Flow Rate: .O2 Flow Rate (L/min): 6 L/min  IO: Intake/output summary:   Intake/Output Summary (Last 24 hours) at 04/11/2020 1419 Last data filed at 04/11/2020 1206 Gross per 24 hour  Intake 240 ml  Output 2200 ml  Net -1960 ml    LBM: Last BM Date: 04/07/20 Baseline Weight: Weight: 77.3 kg Most recent weight: Weight: 76.7 kg     Palliative Assessment/Data:  70%     Time In: 2:00 Time Out 2:40 Time Total: 40 min Greater than 50%  of this time was spent counseling and coordinating care related to the above assessment and plan.  Signed by: Asencion Gowda, NP   Please contact Palliative Medicine Team phone at [phone] for questions and concerns.  For individual provider: See Shea Evans

## 2020-04-11 NOTE — Plan of Care (Signed)
Pt OOB in chair for 2 hours; tolerated well.  Coughing up thick tan phlegm of moderate amounts.   Problem: Education: Goal: Knowledge of disease or condition will improve Outcome: Progressing Goal: Knowledge of the prescribed therapeutic regimen will improve Outcome: Progressing Goal: Individualized Educational Video(s) Outcome: Progressing   Problem: Activity: Goal: Ability to tolerate increased activity will improve Outcome: Progressing Goal: Will verbalize the importance of balancing activity with adequate rest periods Outcome: Progressing   Problem: Respiratory: Goal: Ability to maintain a clear airway will improve Outcome: Progressing Goal: Levels of oxygenation will improve Outcome: Progressing Goal: Ability to maintain adequate ventilation will improve Outcome: Progressing   Problem: Education: Goal: Knowledge of General Education information will improve Description: Including pain rating scale, medication(s)/side effects and non-pharmacologic comfort measures Outcome: Progressing   Problem: Health Behavior/Discharge Planning: Goal: Ability to manage health-related needs will improve Outcome: Progressing   Problem: Clinical Measurements: Goal: Ability to maintain clinical measurements within normal limits will improve Outcome: Progressing Goal: Will remain free from infection Outcome: Progressing Goal: Diagnostic test results will improve Outcome: Progressing Goal: Respiratory complications will improve Outcome: Progressing Goal: Cardiovascular complication will be avoided Outcome: Progressing   Problem: Activity: Goal: Risk for activity intolerance will decrease Outcome: Progressing   Problem: Nutrition: Goal: Adequate nutrition will be maintained Outcome: Progressing   Problem: Coping: Goal: Level of anxiety will decrease Outcome: Progressing   Problem: Elimination: Goal: Will not experience complications related to bowel motility Outcome:  Progressing Goal: Will not experience complications related to urinary retention Outcome: Progressing   Problem: Pain Managment: Goal: General experience of comfort will improve Outcome: Progressing   Problem: Safety: Goal: Ability to remain free from injury will improve Outcome: Progressing   Problem: Skin Integrity: Goal: Risk for impaired skin integrity will decrease Outcome: Progressing

## 2020-04-11 NOTE — Progress Notes (Signed)
PROGRESS NOTE    SHELBI VACCARO  RDE:[id number] DOB: 1946/02/14 DOA: 04/03/2020 PCP: Glean Hess, MD    Brief Narrative:  74 year old female with history of advanced COPD and chronic hypoxemia on 6 L oxygen at home presented to the emergency room with worsening shortness of breath.  In the ER, she was requiring 11 L high flow nasal oxygen.  Chest x-ray showed worsening right-sided airspace opacification.  A CT scan showed 2 cm spiculated mass on the right side consistent with bronchogenic carcinoma.  Admit to the hospital with hypoxia with antibiotics, steroids and high oxygen requirement. 7/23, some improvement of oxygenation.  Mostly on 6 L and sometimes on 8 L. Lives at home alone, her son will be here tomorrow evening and wants to go home on Sunday morning.     Assessment & Plan:   Active Problems:   Acute on chronic respiratory failure with hypoxia (HCC)   Pneumonia   Pulmonary mass  Acute on chronic hypoxic respiratory failure, COPD exacerbation.  Baseline severe COPD on 6 L of oxygen at home. Her respiratory status remains tenuous.  Today on 6 L oxygen which is at about her baseline. Chest x-ray with right-sided findings mostly atelectasis, tumor. CT scan of the chest with 2 cm irregular, spiculated nodule with central cavitation in the right upper lobe.  Consolidation right middle lobe. -Trial of the steroid therapy, aggressive bronchodilator therapy, chest physiotherapy with the breathing exercises and incentive spirometry.  Respiratory following. -Treated with 5 days of IV antibiotics with Ancef and azithromycin.  No evidence of active infection at this time.  CT scan shows dense consolidations, probably malignancy. -Keep on oxygen to keep saturations more than 88%. -Probably has primary bronchogenic carcinoma, followed by pulmonary.  Planning outpatient PET scan once oxygenation improves. -She is DNR/DNI, okay for noninvasive ventilation. -We will consult palliative  care. -Trial of Lasix by pulmonary.  Hypertension: Blood pressure stable on amlodipine.  Hypokalemia/hypomagnesemia: Improved.   DVT prophylaxis: enoxaparin (LOVENOX) injection 40 mg Start: 04/03/20 2100   Code Status: DNR/DNI Family Communication: None.  Patient is communicating. Disposition Plan: Status is: Inpatient  Remains inpatient appropriate because:Inpatient level of care appropriate due to severity of illness   Dispo: The patient is from: Home              Anticipated d/c is to: Home with home health PT OT.              Anticipated d/c date is: 2 days.              Patient currently is not medically stable to d/c.  Unsafe to discharge on higher oxygen and no home support.         Consultants:   Pulmonary  Procedures:   None  Antimicrobials:  Antibiotics Given (last 72 hours)    None         Subjective: Patient seen and examined.  Patient stated her breathing is slightly better.  Less coughing and wheezing today.  On 6 L oxygen at rest, occasionally fluctuates and needs 7 to 8 L of oxygen.  Denies any chest pain.  Objective: Vitals:   04/10/20 2018 04/11/20 0422 04/11/20 0809 04/11/20 1154  BP: (!) 132/71  (!) 118/63 (!) 140/76  Pulse: 85  71 90  Resp: 20 17 20 20   Temp: 97.6 F (36.4 C) 98.2 F (36.8 C) 97.9 F (36.6 C) 97.6 F (36.4 C)  TempSrc: Oral Oral Oral Oral  SpO2: 90%  95% 90%  Weight:      Height:        Intake/Output Summary (Last 24 hours) at 04/11/2020 1234 Last data filed at 04/11/2020 1206 Gross per 24 hour  Intake 480 ml  Output 3100 ml  Net -2620 ml   Filed Weights   04/08/20 0433 04/09/20 0447 04/10/20 0534  Weight: 78 kg 74.6 kg 76.7 kg    Examination:  Physical Exam Constitutional:      General: She is not in acute distress.    Comments: Looks comfortable at rest.  Coughing and wheezing while talking.  On 6 L oxygen.  HENT:     Head: Normocephalic.  Cardiovascular:     Rate and Rhythm: Regular rhythm.    Pulmonary:     Effort: Pulmonary effort is normal.     Comments: Bilateral conducted airway sounds.  Occasional expiratory crackles.  Coughing fits on deep breathing. Abdominal:     General: Bowel sounds are normal.     Palpations: Abdomen is soft.  Musculoskeletal:        General: Normal range of motion.  Neurological:     General: No focal deficit present.     Mental Status: She is alert and oriented to person, place, and time.       Data Reviewed: I have personally reviewed following labs and imaging studies  CBC: Recent Labs  Lab 04/05/20 0402 04/06/20 0616 04/07/20 0300 04/08/20 0455 04/09/20 0441  WBC 12.5* 8.6 7.0 6.6 8.6  HGB 13.8 14.8 14.0 14.1 14.1  HCT 40.4 42.7 41.5 43.2 43.0  MCV 89.0 89.9 89.8 91.5 91.1  PLT 267 226 232 234 308   Basic Metabolic Panel: Recent Labs  Lab 04/06/20 0616 04/07/20 0300 04/08/20 0455 04/09/20 0441 04/11/20 0533  NA 140 138 136 134* 136  K 3.4* 3.4* 4.1 5.2* 4.5  CL 100 101 99 96* 91*  CO2 27 30 29 31 31   GLUCOSE 134* 123* 120* 106* 74  BUN 21 32* 34* 41* 44*  CREATININE 0.75 0.78 0.75 1.00 0.79  CALCIUM 10.1 9.7 9.8 9.6 10.2   GFR: Estimated Creatinine Clearance: 59.1 mL/min (by C-G formula based on SCr of 0.79 mg/dL). Liver Function Tests: No results for input(s): AST, ALT, ALKPHOS, BILITOT, PROT, ALBUMIN in the last 168 hours. No results for input(s): LIPASE, AMYLASE in the last 168 hours. No results for input(s): AMMONIA in the last 168 hours. Coagulation Profile: No results for input(s): INR, PROTIME in the last 168 hours. Cardiac Enzymes: No results for input(s): CKTOTAL, CKMB, CKMBINDEX, TROPONINI in the last 168 hours. BNP (last 3 results) No results for input(s): PROBNP in the last 8760 hours. HbA1C: No results for input(s): HGBA1C in the last 72 hours. CBG: Recent Labs  Lab 04/05/20 1142  GLUCAP 115*   Lipid Profile: No results for input(s): CHOL, HDL, LDLCALC, TRIG, CHOLHDL, LDLDIRECT in the  last 72 hours. Thyroid Function Tests: No results for input(s): TSH, T4TOTAL, FREET4, T3FREE, THYROIDAB in the last 72 hours. Anemia Panel: No results for input(s): VITAMINB12, FOLATE, FERRITIN, TIBC, IRON, RETICCTPCT in the last 72 hours. Sepsis Labs: Recent Labs  Lab 04/05/20 1317 04/06/20 0616 04/07/20 0300  PROCALCITON <0.10 <0.10 <0.10    Recent Results (from the past 240 hour(s))  SARS Coronavirus 2 by RT PCR (hospital order, performed in [facility] hospital lab) Nasopharyngeal Nasopharyngeal Swab     Status: None   Collection Time: 04/03/20  5:04 PM   Specimen: Nasopharyngeal Swab  Result Value Ref  Range Status   SARS Coronavirus 2 NEGATIVE NEGATIVE Final    Comment: (NOTE) SARS-CoV-2 target nucleic acids are NOT DETECTED.  The SARS-CoV-2 RNA is generally detectable in upper and lower respiratory specimens during the acute phase of infection. The lowest concentration of SARS-CoV-2 viral copies this assay can detect is 250 copies / mL. A negative result does not preclude SARS-CoV-2 infection and should not be used as the sole basis for treatment or other patient management decisions.  A negative result may occur with improper specimen collection / handling, submission of specimen other than nasopharyngeal swab, presence of viral mutation(s) within the areas targeted by this assay, and inadequate number of viral copies (<250 copies / mL). A negative result must be combined with clinical observations, patient history, and epidemiological information.  Fact Sheet for Patients:   StrictlyIdeas.no  Fact Sheet for Healthcare Providers: BankingDealers.co.za  This test is not yet approved or  cleared by the [place] FDA and has been authorized for detection and/or diagnosis of SARS-CoV-2 by FDA under an Emergency Use Authorization (EUA).  This EUA will remain in effect (meaning this test can be used) for the duration of  the COVID-19 declaration under Section 564(b)(1) of the Act, 21 U.S.C. section 360bbb-3(b)(1), unless the authorization is terminated or revoked sooner.  Performed at [facility], [address] [place], [place] [address]   Culture, blood (routine x 2)     Status: None   Collection Time: 04/03/20  5:23 PM   Specimen: BLOOD  Result Value Ref Range Status   Specimen Description BLOOD  Final   Special Requests NONE  Final   Culture   Final    NO GROWTH 5 DAYS Performed at [facility], [address] [place], [place] [address]    Report Status 04/08/2020 FINAL  Final  Culture, blood (routine x 2)     Status: Abnormal   Collection Time: 04/03/20  5:24 PM   Specimen: BLOOD LEFT HAND  Result Value Ref Range Status   Specimen Description   Final    BLOOD LEFT HAND Performed at [facility], [place] [address], [place], [place] [address]    Special Requests   Final    NONE Performed at [facility], [address] [place], [place] [address]    Culture  Setup Time   Final    GRAM POSITIVE COCCI AEROBIC BOTTLE ONLY CRITICAL RESULT CALLED TO, READ BACK BY AND VERIFIED WITH: ABBY ELLINGTON 04/04/20 AT 1750 BY AR    Culture (A)  Final    STAPHYLOCOCCUS CAPITIS THE SIGNIFICANCE OF ISOLATING THIS ORGANISM FROM A SINGLE SET OF BLOOD CULTURES WHEN MULTIPLE SETS ARE DRAWN IS UNCERTAIN. PLEASE NOTIFY THE MICROBIOLOGY DEPARTMENT WITHIN ONE WEEK IF SPECIATION AND SENSITIVITIES ARE REQUIRED. Performed at [facility], [place] [address], [place],  [address]    Report Status 04/05/2020 FINAL  Final  Blood Culture ID Panel (Reflexed)     Status: Abnormal   Collection Time: 04/03/20  5:24 PM  Result Value Ref Range Status   Enterococcus species NOT DETECTED NOT DETECTED Final   Listeria monocytogenes NOT DETECTED NOT DETECTED Final   Staphylococcus species DETECTED (A) NOT DETECTED Final    Comment: Methicillin (oxacillin) susceptible coagulase  negative staphylococcus. Possible blood culture contaminant (unless isolated from more than one blood culture draw or clinical case suggests pathogenicity). No antibiotic treatment is indicated for blood  culture contaminants. CRITICAL RESULT CALLED TO, READ BACK BY AND  VERIFIED WITH: ABBY ELLINGTON 04/04/20 @1730  BY ACR    Staphylococcus aureus (BCID) NOT DETECTED NOT DETECTED Final   Methicillin resistance NOT DETECTED NOT DETECTED Final   Streptococcus species NOT DETECTED NOT DETECTED Final   Streptococcus agalactiae NOT DETECTED NOT DETECTED Final   Streptococcus pneumoniae NOT DETECTED NOT DETECTED Final   Streptococcus pyogenes NOT DETECTED NOT DETECTED Final   Acinetobacter baumannii NOT DETECTED NOT DETECTED Final   Enterobacteriaceae species NOT DETECTED NOT DETECTED Final   Enterobacter cloacae complex NOT DETECTED NOT DETECTED Final   Escherichia coli NOT DETECTED NOT DETECTED Final   Klebsiella oxytoca NOT DETECTED NOT DETECTED Final   Klebsiella pneumoniae NOT DETECTED NOT DETECTED Final   Proteus species NOT DETECTED NOT DETECTED Final   Serratia marcescens NOT DETECTED NOT DETECTED Final   Haemophilus influenzae NOT DETECTED NOT DETECTED Final   Neisseria meningitidis NOT DETECTED NOT DETECTED Final   Pseudomonas aeruginosa NOT DETECTED NOT DETECTED Final   Candida albicans NOT DETECTED NOT DETECTED Final   Candida glabrata NOT DETECTED NOT DETECTED Final   Candida krusei NOT DETECTED NOT DETECTED Final   Candida parapsilosis NOT DETECTED NOT DETECTED Final   Candida tropicalis NOT DETECTED NOT DETECTED Final    Comment: Performed at [facility], [address], [place], [place] [address]  Urine Culture     Status: None   Collection Time: 04/05/20 11:50 AM   Specimen: Urine, Random  Result Value Ref Range Status   Specimen Description   Final    URINE, RANDOM Performed at [facility], [address], [place], [place] [address]    Special  Requests   Final    NONE Performed at [facility], [address] [place], [place] [address]    Culture   Final    NO GROWTH Performed at [facility], [address] [place], [place] [address]    Report Status 04/06/2020 FINAL  Final  CULTURE, BLOOD (ROUTINE X 2) w Reflex to ID Panel     Status: None   Collection Time: 04/05/20  1:17 PM   Specimen: BLOOD  Result Value Ref Range Status   Specimen Description BLOOD RAC  Final   Special Requests   Final    BOTTLES DRAWN AEROBIC AND ANAEROBIC Blood Culture adequate volume   Culture   Final    NO GROWTH 5 DAYS Performed at [facility], [address] [place], [place] [address]    Report Status 04/10/2020 FINAL  Final  CULTURE, BLOOD (ROUTINE X 2) w Reflex to ID Panel     Status: None   Collection Time: 04/05/20  1:21 PM   Specimen: BLOOD  Result Value Ref Range Status   Specimen Description BLOOD LAC  Final   Special Requests   Final    BOTTLES DRAWN AEROBIC AND ANAEROBIC Blood Culture adequate volume   Culture   Final    NO GROWTH 5 DAYS Performed at [facility], [address], [place], [place] [address]    Report Status 04/10/2020 FINAL  Final  MRSA PCR Screening     Status: None   Collection Time: 04/05/20  2:45 PM   Specimen: Nasopharyngeal  Result Value Ref Range Status   MRSA by PCR NEGATIVE NEGATIVE Final    Comment:        The GeneXpert MRSA Assay (FDA approved for NASAL specimens only), is one component of a comprehensive MRSA colonization surveillance program. It is not intended to diagnose  MRSA infection nor to guide or monitor treatment for MRSA infections. Performed at [facility], [address] [place], [place] [address]   Respiratory Panel by PCR     Status: None   Collection Time: 04/05/20  2:45 PM   Specimen: Nasopharyngeal Swab; Respiratory  Result Value Ref Range Status   Adenovirus NOT DETECTED NOT DETECTED Final   Coronavirus 229E NOT  DETECTED NOT DETECTED Final    Comment: (NOTE) The Coronavirus on the Respiratory Panel, DOES NOT test for the novel  Coronavirus (2019 nCoV)    Coronavirus HKU1 NOT DETECTED NOT DETECTED Final   Coronavirus NL63 NOT DETECTED NOT DETECTED Final   Coronavirus OC43 NOT DETECTED NOT DETECTED Final   Metapneumovirus NOT DETECTED NOT DETECTED Final   Rhinovirus / Enterovirus NOT DETECTED NOT DETECTED Final   Influenza A NOT DETECTED NOT DETECTED Final   Influenza B NOT DETECTED NOT DETECTED Final   Parainfluenza Virus 1 NOT DETECTED NOT DETECTED Final   Parainfluenza Virus 2 NOT DETECTED NOT DETECTED Final   Parainfluenza Virus 3 NOT DETECTED NOT DETECTED Final   Parainfluenza Virus 4 NOT DETECTED NOT DETECTED Final   Respiratory Syncytial Virus NOT DETECTED NOT DETECTED Final   Bordetella pertussis NOT DETECTED NOT DETECTED Final   Chlamydophila pneumoniae NOT DETECTED NOT DETECTED Final   Mycoplasma pneumoniae NOT DETECTED NOT DETECTED Final    Comment: Performed at [facility], [place], [place], [place] [address]  Expectorated sputum assessment w rflx to resp cult     Status: None   Collection Time: 04/05/20  5:50 PM   Specimen: Sputum  Result Value Ref Range Status   Specimen Description SPUTUM  Final   Special Requests NONE  Final   Sputum evaluation   Final    THIS SPECIMEN IS ACCEPTABLE FOR SPUTUM CULTURE Performed at [facility], [address] [place], [place] [address]    Report Status 04/06/2020 FINAL  Final  Culture, respiratory     Status: None   Collection Time: 04/05/20  5:50 PM   Specimen: SPU  Result Value Ref Range Status   Specimen Description   Final    SPUTUM Performed at [facility], [address], [place], [place] [address]    Special Requests   Final    NONE Reflexed from [id number] Performed at [facility], [address] [place], [place] [address]    Gram Stain   Final    RARE WBC PRESENT, PREDOMINANTLY  MONONUCLEAR RARE SQUAMOUS EPITHELIAL CELLS PRESENT NO ORGANISMS SEEN Performed at [facility], [place] [address], [place], [place] [address]    Culture   Final    Consistent with normal respiratory flora. FUNGUS (MOLD) ISOLATED, PROBABLE CONTAMINANT/COLONIZER (SAPROPHYTE). CONTACT MICROBIOLOGY IF FURTHER IDENTIFICATION REQUIRED [phone].    Report Status 04/08/2020 FINAL  Final  Expectorated sputum assessment w rflx to resp cult     Status: None   Collection Time: 04/10/20 11:26 AM   Specimen: Expectorated Sputum  Result Value Ref Range Status   Specimen Description EXPECTORATED SPUTUM  Final   Special Requests NONE  Final   Sputum evaluation   Final    THIS SPECIMEN IS ACCEPTABLE FOR SPUTUM CULTURE Performed at [facility], [address], [place],  [address]    Report Status 04/10/2020 FINAL  Final  Culture, respiratory     Status: None (Preliminary result)   Collection Time: 04/10/20 11:26 AM  Result Value Ref Range Status  Specimen Description   Final    EXPECTORATED SPUTUM Performed at [facility], [address], [place], [place] [address]    Special Requests   Final    NONE Reflexed from [id number] Performed at [facility], [place], [place] [address]    Gram Stain PENDING  Incomplete   Culture   Final    CULTURE REINCUBATED FOR BETTER GROWTH Performed at [facility], [place] [address] [place], [place] [address]    Report Status PENDING  Incomplete         Radiology Studies: DG Chest Port 1 View  Result Date: 04/10/2020 CLINICAL DATA:  Respiratory failure EXAM: PORTABLE CHEST 1 VIEW COMPARISON:  04/05/2020, 04/07/2020 FINDINGS: Right-sided chest port remains in place. Heart size is normal. Atherosclerotic calcification of the aortic knob. Masslike thickening of the lower right paratracheal stripe compatible with known mediastinal lymphadenopathy. Right middle lobe atelectasis/consolidation. Nodular  opacities in the right mid lung. Streaky left basilar atelectasis. Emphysematous changes to the lung bases. No large pleural fluid collection. No pneumothorax. IMPRESSION: 1. No appreciable interval change from prior. Persistent right middle lobe atelectasis/consolidation. 2. Nodular opacities in the right mid lung, as described on recent CT. 3. Mediastinal lymphadenopathy. Electronically Signed   By: Davina Poke D.O.   On: 04/10/2020 08:19        Scheduled Meds:  amLODipine  5 mg Oral Daily   atorvastatin  20 mg Oral QHS   Chlorhexidine Gluconate Cloth  6 each Topical Daily   cyclobenzaprine  10 mg Oral BID   enoxaparin (LOVENOX) injection  40 mg Subcutaneous Q24H   furosemide  40 mg Intravenous Daily   guaiFENesin  600 mg Oral BID   ipratropium-albuterol  3 mL Nebulization TID   pantoprazole  40 mg Oral Daily   predniSONE  50 mg Oral Q breakfast   Continuous Infusions:  sodium chloride Stopped (04/08/20 1900)     LOS: 8 days    Time spent: 30 minutes    Barb Merino, MD [facility] Pager [phone]

## 2020-04-12 MED ORDER — PREDNISONE 20 MG PO TABS
20.0000 mg | ORAL_TABLET | Freq: Every day | ORAL | Status: DC
  Administered 2020-04-13: 20 mg via ORAL
  Filled 2020-04-12: qty 1

## 2020-04-12 NOTE — Progress Notes (Signed)
Pulmonary Medicine          Date: 04/12/2020,   MRN# [id number] Carol Gay December 01, 1945     AdmissionWeight: 77.3 kg                 CurrentWeight: 76.7 kg   Referring physician: Dr Jimmye Norman    CHIEF COMPLAINT:   Acute on chronic hypoxemic respiratory failure   SUBJECTIVE   Patient weaned to 4L/min. She is optimized for d/c home in am with plan for outpatient follow up in pulmonary clinic.    Please d/c home on 20mg  prednisone x 5 days and 250mg  zithromax x5d  PAST MEDICAL HISTORY   Past Medical History:  Diagnosis Date  . Aortic atherosclerosis ([place])   . Arm pain   . Asthma   . Benign neoplasm of rectosigmoid junction   . Benign neoplasm of transverse colon   . CAD in native artery   . Cancer ([place]) 09/2015   rectum  . Cigarette smoker   . Claustrophobia   . Colon cancer ([place])   . 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 COPD (chronic obstructive pulmonary disease) ([place])   . DDD (degenerative disc disease), lumbar       . Dermatophytoses   . Dyslipidemia   . H/O blood clots    left leg, veins stripped  . Hemorrhoids   . Hypertension   . Last menstrual period (LMP) > 10 days ago 1994  . Myocardial infarction [facility])    "mild" - age 74  . Personal history of chemotherapy   . Personal history of radiation therapy   . Pulmonary emphysema ([place])   . Rectal bleeding   . Shortness of breath dyspnea   . Smokers' cough ([place])   . Spinal headache    with one of my children's birth  . Vertigo   . Wears dentures    has full upper and lower - doesn't wear     SURGICAL HISTORY   Past Surgical History:  Procedure Laterality Date  . APPENDECTOMY    . BREAST BIOPSY Left 1970's   neg  . COLONOSCOPY WITH PROPOFOL N/A 11/06/2015   Procedure: COLONOSCOPY WITH PROPOFOL;  Surgeon: Lucilla Lame, MD;  Location: [place];  Service: Endoscopy;  Laterality: N/A;  LEAVE PT EARLY  . EXPLORATORY LAPAROTOMY    . LESION  EXCISION  11/03/15   Anal - Dr Dahlia Byes, [facility]  . LESION REMOVAL  1970's   from tailbone  . [place] SURGERY  1997  . OVARIAN CYST REMOVAL Left   . POLYPECTOMY  11/06/2015   Procedure: POLYPECTOMY;  Surgeon: Lucilla Lame, MD;  Location: [place];  Service: Endoscopy;;  . PORTACATH PLACEMENT N/A 12/10/2015   Procedure: INSERTION PORT-A-CATH;  Surgeon: Jules Husbands, MD;  Location: [facility];  Service: General;  Laterality: N/A;  . RECTAL EXAM UNDER ANESTHESIA N/A 06/23/2016   Procedure: RECTAL EXAM UNDER ANESTHESIA;  Surgeon: Jules Husbands, MD;  Location: [facility];  Service: General;  Laterality: N/A;  . VEIN LIGATION AND STRIPPING       FAMILY HISTORY   Family History  Problem Relation Age of Onset  . Cancer Mother        lymphoma  . Heart disease Mother   . Cancer Maternal Grandmother        breast  . Birth defects Maternal Grandmother      SOCIAL HISTORY   Social  History   Tobacco Use  . Smoking status: Former Smoker    Packs/day: 1.00    Years: 57.00    Pack years: 57.00    Types: Cigarettes    Quit date: 03/30/2016    Years since quitting: 4.0  . Smokeless tobacco: Never Used  . Tobacco comment: Quit in July 2017  Vaping Use  . Vaping Use: Never used  Substance Use Topics  . Alcohol use: No    Alcohol/week: 0.0 standard drinks  . Drug use: No     MEDICATIONS    Home Medication:    Current Medication:  Current Facility-Administered Medications:  .  0.9 %  sodium chloride infusion, , Intravenous, PRN, Wyvonnia Dusky, MD, Stopped at 04/08/20 1900 .  acetaminophen (TYLENOL) tablet 650 mg, 650 mg, Oral, Q6H PRN, 650 mg at 04/12/20 1314 **OR** acetaminophen (TYLENOL) suppository 650 mg, 650 mg, Rectal, Q6H PRN, Mansy, Jan A, MD .  amLODipine (NORVASC) tablet 5 mg, 5 mg, Oral, Daily, Mansy, Jan A, MD, 5 mg at 04/12/20 1007 .  atorvastatin (LIPITOR) tablet 20 mg, 20 mg, Oral, QHS, Mansy, Jan A, MD, 20 mg at 04/11/20 2206 .  calcium carbonate  (TUMS - dosed in mg elemental calcium) chewable tablet 200 mg of elemental calcium, 1 tablet, Oral, TID PRN, Mansy, Jan A, MD, 200 mg of elemental calcium at 04/09/20 2140 .  Chlorhexidine Gluconate Cloth 2 % PADS 6 each, 6 each, Topical, Daily, Wyvonnia Dusky, MD, 6 each at 04/12/20 1007 .  cyclobenzaprine (FLEXERIL) tablet 10 mg, 10 mg, Oral, BID, Mansy, Jan A, MD, 10 mg at 04/12/20 1006 .  enoxaparin (LOVENOX) injection 40 mg, 40 mg, Subcutaneous, Q24H, Mansy, Jan A, MD, 40 mg at 04/11/20 2205 .  furosemide (LASIX) injection 40 mg, 40 mg, Intravenous, Daily, Lanney Gins, Kenli Waldo, MD, 40 mg at 04/12/20 1007 .  guaiFENesin (MUCINEX) 12 hr tablet 600 mg, 600 mg, Oral, BID, Mansy, Jan A, MD, 600 mg at 04/12/20 1007 .  guaiFENesin-dextromethorphan (ROBITUSSIN DM) 100-10 MG/5ML syrup 5 mL, 5 mL, Oral, Q4H PRN, Mansy, Jan A, MD .  hydrOXYzine (ATARAX/VISTARIL) tablet 25 mg, 25 mg, Oral, BID PRN, Mansy, Jan A, MD .  ipratropium-albuterol (DUONEB) 0.5-2.5 (3) MG/3ML nebulizer solution 3 mL, 3 mL, Nebulization, TID, Wyvonnia Dusky, MD, 3 mL at 04/12/20 0759 .  labetalol (NORMODYNE) injection 20 mg, 20 mg, Intravenous, Q3H PRN, Mansy, Jan A, MD .  levalbuterol Penne Lash) nebulizer solution 0.63 mg, 0.63 mg, Nebulization, Q6H PRN, Mansy, Jan A, MD, 0.63 mg at 04/05/20 1411 .  magnesium hydroxide (MILK OF MAGNESIA) suspension 30 mL, 30 mL, Oral, Daily PRN, Mansy, Jan A, MD .  ondansetron (ZOFRAN) tablet 4 mg, 4 mg, Oral, Q6H PRN **OR** ondansetron (ZOFRAN) injection 4 mg, 4 mg, Intravenous, Q6H PRN, Mansy, Jan A, MD, 4 mg at 04/06/20 2131 .  pantoprazole (PROTONIX) EC tablet 40 mg, 40 mg, Oral, Daily, Ghimire, Kuber, MD, 40 mg at 04/12/20 1007 .  predniSONE (DELTASONE) tablet 40 mg, 40 mg, Oral, Q breakfast, Maddix Heinz, MD, 40 mg at 04/12/20 1006 .  traZODone (DESYREL) tablet 25 mg, 25 mg, Oral, QHS PRN, Mansy, Jan A, MD, 25 mg at 04/11/20 2205  Facility-Administered Medications Ordered in Other  Encounters:  .  albuterol (PROVENTIL) (2.5 MG/3ML) 0.083% nebulizer solution 2.5 mg, 2.5 mg, Nebulization, Once, Laverle Hobby, MD    ALLERGIES   Bee venom, Aspirin, Codeine, Morphine and related, and Cat hair extract     REVIEW OF SYSTEMS  Review of Systems:  Gen:  Denies  fever, sweats, chills weigh loss  HEENT: Denies blurred vision, double vision, ear pain, eye pain, hearing loss, nose bleeds, sore throat Cardiac:  No dizziness, chest pain or heaviness, chest tightness,edema Resp: reports cough or sputum porduction, shortness of breath,wheezing, hemoptysis,  Gi: Denies swallowing difficulty, stomach pain, nausea or vomiting, diarrhea, constipation, bowel incontinence Gu:  Denies bladder incontinence, burning urine Ext:   Denies Joint pain, stiffness or swelling Skin: Denies  skin rash, easy bruising or bleeding or hives Endoc:  Denies polyuria, polydipsia , polyphagia or weight change Psych:   Denies depression, insomnia or hallucinations   Other:  All other systems negative   VS: BP (!) 146/77 (BP Location: Right Arm)   Pulse 95   Temp 97.8 F (36.6 C)   Resp 16   Ht 5\' 2"  (1.575 m)   Wt 76.7 kg   SpO2 93%   BMI 30.91 kg/m      PHYSICAL EXAM    GENERAL:NAD, no fevers, chills, no weakness no fatigue HEAD: Normocephalic, atraumatic.  EYES: Pupils equal, round, reactive to light. Extraocular muscles intact. No scleral icterus.  MOUTH: Moist mucosal membrane. Dentition intact. No abscess noted.  EAR, NOSE, THROAT: Clear without exudates. No external lesions.  NECK: Supple. No thyromegaly. No nodules. No JVD.  PULMONARY: Diffuse coarse rhonchi right sided +wheezes CARDIOVASCULAR: S1 and S2. Regular rate and rhythm. No murmurs, rubs, or gallops. No edema. Pedal pulses 2+ bilaterally.  GASTROINTESTINAL: Soft, nontender, nondistended. No masses. Positive bowel sounds. No hepatosplenomegaly.  MUSCULOSKELETAL: No swelling, clubbing, or edema. Range of  motion full in all extremities.  NEUROLOGIC: Cranial nerves II through XII are intact. No gross focal neurological deficits. Sensation intact. Reflexes intact.  SKIN: No ulceration, lesions, rashes, or cyanosis. Skin warm and dry. Turgor intact.  PSYCHIATRIC: Mood, affect within normal limits. The patient is awake, alert and oriented x 3. Insight, judgment intact.       IMAGING    DG Chest 1 View  Result Date: 04/03/2020 CLINICAL DATA:  Shortness of breath EXAM: CHEST  1 VIEW COMPARISON:  01/28/2020 FINDINGS: Right-sided chest port in stable positioning. Heart size is normal. Atherosclerotic calcification of the aortic knob. Interval development of patchy and interstitial opacities within the mid to lower aspect of the right lung. Left lung is hyperexpanded and clear. Scattered granulomas are unchanged. No pleural effusion or pneumothorax identified. IMPRESSION: Interval development of patchy and interstitial opacities within the mid to lower aspect of the right lung. Findings suspicious for pneumonia. Radiographic follow-up to resolution is recommended. Electronically Signed   By: Davina Poke D.O.   On: 04/03/2020 16:24   CT CHEST W CONTRAST  Result Date: 04/07/2020 CLINICAL DATA:  Progressive hypoxemia EXAM: CT CHEST WITH CONTRAST TECHNIQUE: Multidetector CT imaging of the chest was performed during intravenous contrast administration. CONTRAST:  87mL OMNIPAQUE IOHEXOL 300 MG/ML  SOLN COMPARISON:  CT 07/02/2016, radiograph 04/05/2020 FINDINGS: Cardiovascular: Cardiac size at the upper limits of normal. Trace pericardial thickening similar to priors. Coronary artery calcifications are present. Dense calcifications present at the aortic root and throughout the thoracic aorta. Aorta is normal caliber. No acute luminal abnormality nor periaortic stranding or hemorrhage. Normal 3 vessel branching of the aortic arch. Extensive calcification of the proximal great vessels. Central pulmonary arteries  are borderline enlarged. No large central or lobar filling defects are seen on this limited, non tailored examination of the pulmonary arteries. Right IJ approach Port-A-Cath tip terminates at the lower  SVC. Mediastinum/Nodes: Mediastinal and hilar adenopathy: Index nodes include pretracheal lymph node measuring up to 2.5 cm in size (2/48), 13 mm right hilar lymph node (2/74), 10 mm subcarinal lymph node (2/69). No acute abnormality of the trachea or esophagus. Slight rightward mediastinal shift likely related to volume loss in the right hemithorax. Thyroid gland and thoracic inlet are unremarkable. Lungs/Pleura: Extensive centrilobular and paraseptal emphysematous changes throughout the lungs. Chronic areas of subpleural reticulation and fibrotic change most pronounced in the right hemithorax. There is an irregular, spiculated nodule with some central cavitation measuring up to 2 x 2 x 1.3 cm in size (2/63). A more dense area of consolidated lung and volume loss with underlying heterogeneous enhancement is noted in the right middle lobe along the paramediastinal border with some associated air bronchograms more centrally. No other concerning pulmonary nodules or masses. Scattered calcified granulomata are noted throughout both lungs. Chronic area of scarring in the left lung base with some likely adjacent subsegmental atelectasis. Upper Abdomen: Right renal scarring. No acute abnormalities present in the visualized portions of the upper abdomen. Musculoskeletal: No acute osseous abnormality or suspicious osseous lesion. Degenerative changes in the spine and shoulders. No suspicious chest wall lesions. IMPRESSION: 1. Irregular, spiculated nodule with some central cavitation in the right upper lobe measuring up to 2 cm in size, concerning for primary bronchogenic malignancy or less likely metastasis. Cavitary pneumonia is less favored given the additional findings in the chest. 2. More dense area of consolidated lung  and volume loss with underlying heterogeneous enhancement in the right middle lobe along the paramediastinal border with some associated air bronchograms more centrally. Certainly could reflect an acute consolidative airspace process/pneumonia though underlying mass lesion is not excluded. 3. Mediastinal and hilar adenopathy, concerning for metastatic disease. 4. Borderline enlarged central pulmonary arteries, which can be seen with pulmonary arterial hypertension. 5. Aortic Atherosclerosis (ICD10-I70.0) 6. Emphysema (ICD10-J43.9). These results will be called to the ordering clinician or representative by the Radiologist Assistant, and communication documented in the PACS or [employer]. Elevated Electronically Signed   By: Lovena Le M.D.   On: 04/07/2020 19:37   DG Chest Port 1 View  Result Date: 04/10/2020 CLINICAL DATA:  Respiratory failure EXAM: PORTABLE CHEST 1 VIEW COMPARISON:  04/05/2020, 04/07/2020 FINDINGS: Right-sided chest port remains in place. Heart size is normal. Atherosclerotic calcification of the aortic knob. Masslike thickening of the lower right paratracheal stripe compatible with known mediastinal lymphadenopathy. Right middle lobe atelectasis/consolidation. Nodular opacities in the right mid lung. Streaky left basilar atelectasis. Emphysematous changes to the lung bases. No large pleural fluid collection. No pneumothorax. IMPRESSION: 1. No appreciable interval change from prior. Persistent right middle lobe atelectasis/consolidation. 2. Nodular opacities in the right mid lung, as described on recent CT. 3. Mediastinal lymphadenopathy. Electronically Signed   By: Davina Poke D.O.   On: 04/10/2020 08:19   DG Chest Port 1 View  Result Date: 04/05/2020 CLINICAL DATA:  74 year old female with a history of shortness of breath EXAM: PORTABLE CHEST 1 VIEW COMPARISON:  04/03/2020 FINDINGS: Cardiomediastinal silhouette unchanged. Reticulonodular opacities of the right greater than  left lung, unchanged from the comparison plain film. No pneumothorax. No pleural effusion. Unchanged right IJ port catheter. IMPRESSION: Similar appearance of the chest x-ray with right greater than left reticulonodular opacities, potentially multifocal infection. Unchanged right IJ port catheter. Electronically Signed   By: Corrie Mckusick D.O.   On: 04/05/2020 15:27   ECHOCARDIOGRAM COMPLETE  Result Date: 04/05/2020    ECHOCARDIOGRAM REPORT  Patient Name:   CALEN POSCH Date of Exam: 04/05/2020 Medical Rec #:  [id number]        Height:       62.0 in Accession #:    [id number]       Weight:       173.7 lb Date of Birth:  10-13-1945        BSA:          1.801 m Patient Age:    74 years         BP:           143/68 mmHg Patient Gender: F                HR:           100 bpm. Exam Location:  [facility] Procedure: 2D Echo Indications:     Bacteremia  History:         Patient has no prior history of Echocardiogram examinations.                  Advanced Stage COPD; Risk Factors:Current Smoker and                  Hypertension.  Sonographer:     L Thornton-Maynard Referring Phys:  [id number] Wyvonnia Dusky Diagnosing Phys: Ida Rogue MD  Sonographer Comments: Image acquisition challenging due to COPD. IMPRESSIONS  1. Left ventricular ejection fraction, by estimation, is 55 to 60%. The left ventricle has normal function. The left ventricle has no regional wall motion abnormalities. There is mild left ventricular hypertrophy. Left ventricular diastolic parameters are consistent with Grade I diastolic dysfunction (impaired relaxation).  2. Right ventricular systolic function is mildly reduced. The right ventricular size is moderately enlarged. There is severely elevated pulmonary artery systolic pressure. The estimated right ventricular systolic pressure is 02.5 mmHg.  3. No valve vegetation noted. FINDINGS  Left Ventricle: Left ventricular ejection fraction, by estimation, is 55 to 60%. The left ventricle has normal  function. The left ventricle has no regional wall motion abnormalities. The left ventricular internal cavity size was normal in size. There is  mild left ventricular hypertrophy. Left ventricular diastolic parameters are consistent with Grade I diastolic dysfunction (impaired relaxation). Right Ventricle: The right ventricular size is moderately enlarged. No increase in right ventricular wall thickness. Right ventricular systolic function is mildly reduced. There is severely elevated pulmonary artery systolic pressure. The tricuspid regurgitant velocity is 4.48 m/s, and with an assumed right atrial pressure of 10 mmHg, the estimated right ventricular systolic pressure is 85.2 mmHg. Left Atrium: Left atrial size was normal in size. Right Atrium: Right atrial size was normal in size. Pericardium: There is no evidence of pericardial effusion. Mitral Valve: The mitral valve is normal in structure. Normal mobility of the mitral valve leaflets. Mild mitral valve regurgitation. No evidence of mitral valve stenosis. MV peak gradient, 5.4 mmHg. The mean mitral valve gradient is 3.0 mmHg. Tricuspid Valve: The tricuspid valve is normal in structure. Tricuspid valve regurgitation is mild . No evidence of tricuspid stenosis. Aortic Valve: The aortic valve was not well visualized. Aortic valve regurgitation is not visualized. No aortic stenosis is present. Aortic valve mean gradient measures 5.0 mmHg. Aortic valve peak gradient measures 7.0 mmHg. Aortic valve area, by VTI measures 1.31 cm. Pulmonic Valve: The pulmonic valve was normal in structure. Pulmonic valve regurgitation is not visualized. No evidence of pulmonic stenosis. Aorta: The aortic root is normal in size and structure. Venous: The  inferior vena cava is normal in size with less than 50% respiratory variability, suggesting right atrial pressure of 8 mmHg. IAS/Shunts: No atrial level shunt detected by color flow Doppler.  LEFT VENTRICLE PLAX 2D LVIDd:         3.98 cm   Diastology LVIDs:         2.65 cm  LV e' lateral:   6.31 cm/s LV PW:         1.61 cm  LV E/e' lateral: 12.4 LV IVS:        1.55 cm  LV e' medial:    5.33 cm/s LVOT diam:     1.70 cm  LV E/e' medial:  14.7 LV SV:         38 LV SV Index:   21 LVOT Area:     2.27 cm  RIGHT VENTRICLE RV S prime:     14.60 cm/s TAPSE (M-mode): 1.8 cm LEFT ATRIUM         Index LA diam:    3.50 cm 1.94 cm/m  AORTIC VALVE AV Area (Vmax):    1.47 cm AV Area (Vmean):   1.25 cm AV Area (VTI):     1.31 cm AV Vmax:           132.00 cm/s AV Vmean:          104.000 cm/s AV VTI:            0.292 m AV Peak Grad:      7.0 mmHg AV Mean Grad:      5.0 mmHg LVOT Vmax:         85.50 cm/s LVOT Vmean:        57.500 cm/s LVOT VTI:          0.168 m LVOT/AV VTI ratio: 0.58 MITRAL VALVE                TRICUSPID VALVE MV Area (PHT): 5.00 cm     TR Peak grad:   80.3 mmHg MV Peak grad:  5.4 mmHg     TR Vmax:        448.00 cm/s MV Mean grad:  3.0 mmHg MV Vmax:       1.16 m/s     SHUNTS MV Vmean:      83.0 cm/s    Systemic VTI:  0.17 m MV E velocity: 78.40 cm/s   Systemic Diam: 1.70 cm MV A velocity: 117.00 cm/s MV E/A ratio:  0.67 Ida Rogue MD Electronically signed by Ida Rogue MD Signature Date/Time: 04/05/2020/11:07:53 PM    Final              Media Information   Document Information  Photos    04/08/2020 17:22  Attached To:  Hospital Encounter on 04/03/20  Source Information  Ottie Glazier, MD  [facility]       ASSESSMENT/PLAN   Acute on chronic hypoxemic respiratory failure  - due to right lung community acquired pneumonia vs AECOPD due to malingancy  -bacterial/viral etiology workup is negative  -respiratory culture via expectorated sputum negative -Repeat blood culturues - 1 + staph capitis likely contaminant -hx of squamous stage 3 rectal CA- s/p chemoradiotherapy -RVP-negative -MRSA nasal -negative -Procalcitonin trend -Legionella and strep pneumo negative-Bnegative  -CXR reviewed as  above with RLL infiltrate -Swallow evaluation to r/o recurrent aspiration due to RLL inifiltrate - narrow Antimicrobial regimen post above workup  I have discussed CT chest findings with patient at length , she will need additional  workup including MRI brain, outpatient PET.  Plan is to continue to improve her respiratory status so she can go home and complete cancer workup.  Will stop Ancef today  Community acquired pneumonia  - present on admission   - etiology not clear at this time - possible viral/bacterial vs aspiration related as above  - Vanc/Cefepime >>Zithromax/Ancef>>full course completed  -Solumedrol >>pred  - s/p IVF-dc/d  -repeat blood cx -noted legionella/strep pneumo -RVP-negative  - repeat CXR in am  Reviewed ABG with hypoxemia without hypercapnia   Severe acute exacerbation of COPD  - agree with current COPD carepath  - continue to wean steroids-changing solumedrol to prednisone 40mg  with taper  - IS - I have encouraged patient to keep working with spirometer - currently tV at 850cc per breath  - c/w antibiotics  - DuoNEb therapy per RT   Chronic hypoxemia   - PT/OT and consider palliative care    - monitor diuresis-have diuresed >2200cc overnight, will decrease lasix to 20 daily   - metaneb for recruitment maneuvers       Thank you for allowing me to participate in the care of this patient.   Patient/Family are satisfied with care plan and all questions have been answered.   This document was prepared using Dragon voice recognition software and may include unintentional dictation errors.     Ottie Glazier, M.D.  Division of [place]

## 2020-04-12 NOTE — Progress Notes (Signed)
PROGRESS NOTE  Carol Gay  DOB: 06-04-1946  PCP: Glean Hess, MD WJX:[id number]  DOA: 04/03/2020  LOS: 9 days   Chief Complaint  Patient presents with  . Shortness of Breath   Brief narrative: Patient is a 74 year old female with history of advanced COPD and chronic hypoxemia on 6 L oxygen at home. Patient presented to ED on 7/15 with worsening shortness of breath.  In the ER, she was requiring 11 L high flow nasal oxygen.  Chest x-ray showed worsening right-sided airspace opacification.   CT scan showed 2 cm spiculated mass on the right side consistent with bronchogenic carcinoma.   Patient was admitted to the hospital with hypoxia with antibiotics, steroids and high oxygen requirement. Pulmonary consultation was obtained.  Subjective: Patient was seen and examined this morning.  Patient elderly Caucasian female.  Lying down in bed.  Not in distress.  On 6 L oxygen by nasal collar.  Has significant wet cough.  States that her son is traveling from [place] and pick her up to go home tomorrow.  Assessment/Plan: Acute on chronic hypoxic respiratory failure Acute COPD exacerbation Community-acquired pneumonia in the right -On 6 L oxygen at home at baseline. -Presented with worsening shortness of breath.  Required up to 11 L oxygen, gradually weaned down to home requirement. -Chest x-ray on admission showed worsening right-sided airspace opacification. -Patient probably had viral bronchitis versus atypical pneumonia -Completed 5-day course of IV antibiotics with IV Ancef and azithromycin. -Initially started on IV Solu-Medrol, switched to oral prednisone currently on 40 mg daily.  Gradually taper down. -Continue aggressive bronchodilator therapy, chest physiotherapy with the breathing exercises and incentive spirometry.   -Also trial of Lasix given.  Currently on Lasix 40 mg IV daily.  Right lung upper lobe tumor -CT scan of the chest on admission showed 2 cm irregular,  spiculated nodule with central cavitation in the right upper lobe. -Seen by pulmonary.  Patient will need further studies including MRI brain, outpatient PET scan.  She will follow up with pulmonary as an outpatient. -Seen by palliative care as well.  Outpatient palliative follow-up recommended.  Hypertension: Blood pressure stable on amlodipine.  Hypokalemia/hypomagnesemia: Improved.  Mobility: Encourage ambulation Code Status:   Code Status: DNR  Nutritional status: Body mass index is 30.91 kg/m.     Diet Order            Diet Heart Room service appropriate? Yes; Fluid consistency: Thin  Diet effective now                 DVT prophylaxis: enoxaparin (LOVENOX) injection 40 mg Start: 04/03/20 2100   Antimicrobials:  Completed 5-day course of antibiotics Fluid: None  Consultants: Pulmonary, palliative care Family Communication:  None at bedside  Status is: Inpatient  Remains inpatient appropriate because:Unsafe d/c plan   Dispo:  Patient From: Home  Planned Disposition: Home with Health Care S[facility]  Expected discharge date: 04/13/20  Medically stable for discharge: No        Infusions:  . sodium chloride Stopped (04/08/20 1900)    Scheduled Meds: . amLODipine  5 mg Oral Daily  . atorvastatin  20 mg Oral QHS  . Chlorhexidine Gluconate Cloth  6 each Topical Daily  . cyclobenzaprine  10 mg Oral BID  . enoxaparin (LOVENOX) injection  40 mg Subcutaneous Q24H  . furosemide  40 mg Intravenous Daily  . guaiFENesin  600 mg Oral BID  . ipratropium-albuterol  3 mL Nebulization TID  . pantoprazole  40  mg Oral Daily  . predniSONE  40 mg Oral Q breakfast    Antimicrobials: Anti-infectives (From admission, onward)   Start     Dose/Rate Route Frequency Ordered Stop   04/04/20 1800  ceFAZolin (ANCEF) IVPB 2g/100 mL premix  Status:  Discontinued        2 g 200 mL/hr over 30 Minutes Intravenous Every 8 hours 04/04/20 1734 04/08/20 1138   04/04/20 0700  cefTRIAXone  (ROCEPHIN) 2 g in sodium chloride 0.9 % 100 mL IVPB  Status:  Discontinued        2 g 200 mL/hr over 30 Minutes Intravenous Every 24 hours 04/03/20 1955 04/04/20 1734   04/03/20 2000  azithromycin (ZITHROMAX) 500 mg in sodium chloride 0.9 % 250 mL IVPB        500 mg 250 mL/hr over 60 Minutes Intravenous Every 24 hours 04/03/20 1955 04/07/20 2107   04/03/20 1830  vancomycin (VANCOCIN) IVPB 1000 mg/200 mL premix  Status:  Discontinued        1,000 mg 200 mL/hr over 60 Minutes Intravenous  Once 04/03/20 1818 04/03/20 1826   04/03/20 1830  ceFEPIme (MAXIPIME) 2 g in sodium chloride 0.9 % 100 mL IVPB        2 g 200 mL/hr over 30 Minutes Intravenous  Once 04/03/20 1818 04/03/20 1941   04/03/20 1830  vancomycin (VANCOREADY) IVPB 1750 mg/350 mL        1,750 mg 175 mL/hr over 120 Minutes Intravenous  Once 04/03/20 1826 04/03/20 2140      PRN meds: sodium chloride, acetaminophen **OR** acetaminophen, calcium carbonate, guaiFENesin-dextromethorphan, hydrOXYzine, labetalol, levalbuterol, magnesium hydroxide, ondansetron **OR** ondansetron (ZOFRAN) IV, traZODone   Objective: Vitals:   04/12/20 0522 04/12/20 1217  BP: (!) 137/68 (!) 146/77  Pulse: 71 95  Resp: 20 16  Temp: (!) 97.4 F (36.3 C) 97.8 F (36.6 C)  SpO2: 97% 93%    Intake/Output Summary (Last 24 hours) at 04/12/2020 1219 Last data filed at 04/12/2020 1100 Gross per 24 hour  Intake --  Output 2250 ml  Net -2250 ml   Filed Weights   04/08/20 0433 04/09/20 0447 04/10/20 0534  Weight: 78 kg 74.6 kg 76.7 kg   Weight change:  Body mass index is 30.91 kg/m.   Physical Exam: General exam: Appears calm and comfortable.  Skin: No rashes, lesions or ulcers. HEENT: Atraumatic, normocephalic, supple neck, no obvious bleeding Lungs: Mild scattered wheezing bilaterally, scattered rhonchi CVS: Regular rate and rhythm, no murmur GI/Abd soft, nontender, nondistended, bowel sound present CNS: Alert, awake amount of x3 Psychiatry:  Mood appropriate Extremities: No edema, no calf tenderness  Data Review: I have personally reviewed the laboratory data and studies available.  Recent Labs  Lab 04/06/20 0616 04/07/20 0300 04/08/20 0455 04/09/20 0441  WBC 8.6 7.0 6.6 8.6  HGB 14.8 14.0 14.1 14.1  HCT 42.7 41.5 43.2 43.0  MCV 89.9 89.8 91.5 91.1  PLT 226 232 234 237   Recent Labs  Lab 04/06/20 0616 04/07/20 0300 04/08/20 0455 04/09/20 0441 04/11/20 0533  NA 140 138 136 134* 136  K 3.4* 3.4* 4.1 5.2* 4.5  CL 100 101 99 96* 91*  CO2 27 30 29 31 31   GLUCOSE 134* 123* 120* 106* 74  BUN 21 32* 34* 41* 44*  CREATININE 0.75 0.78 0.75 1.00 0.79  CALCIUM 10.1 9.7 9.8 9.6 10.2    Signed, Terrilee Croak, MD [facility] Pager: [phone] (Secure Chat preferred). 04/12/2020

## 2020-04-13 MED ORDER — GUAIFENESIN-DM 100-10 MG/5ML PO SYRP: 5.0000 mL | ORAL_SOLUTION | ORAL | 0 refills | Status: AC | PRN

## 2020-04-13 MED ORDER — PREDNISONE 20 MG PO TABS: 20.0000 mg | ORAL_TABLET | Freq: Every day | ORAL | 0 refills | Status: AC

## 2020-04-13 MED ORDER — AZITHROMYCIN 250 MG PO TABS
ORAL_TABLET | ORAL | 0 refills | Status: DC
Start: 2020-04-13 — End: 2020-05-05

## 2020-04-13 NOTE — Discharge Summary (Signed)
Physician Discharge Summary  Carol Gay CHE:[id number] DOB: 09-Mar-1946 DOA: 04/03/2020  PCP: Glean Hess, MD  Admit date: 04/03/2020 Discharge date: 04/13/2020  Admitted From: Home Discharge disposition: Home   Code Status: DNR  Diet Recommendation: Cardiac diet   Recommendations for Outpatient Follow-Up:   1. Follow-up with PCP as an outpatient 2. Follow with pulmonology as an outpatient  Discharge Diagnosis:   Active Problems:   Acute on chronic respiratory failure with hypoxia (HCC)   Pneumonia   Pulmonary mass    History of Present Illness / Brief narrative:  Patient is a 74 year old female with history of advanced COPD and chronic hypoxemia on 6 L oxygen at home. Patient presented to ED on 7/15 with worsening shortness of breath.  In the ER, she was requiring 11 L high flow nasal oxygen.  Chest x-ray showed worsening right-sided airspace opacification.  CT scan showed 2 cm spiculated mass on the right side consistent with bronchogenic carcinoma.  Patient was admitted to the hospital with hypoxia with antibiotics, steroids and high oxygen requirement. Pulmonary consultation was obtained.   Hospital Course:  Acute on chronic hypoxic respiratory failure Acute COPD exacerbation Community-acquired pneumonia in the right -On 6 L oxygen at home at baseline. -Presented with worsening shortness of breath.  Required up to 11 L oxygen, gradually weaned down to home requirement. -Chest x-ray on admission showed worsening right-sided airspace opacification. -Patient probably had viral bronchitis versus atypical pneumonia -Treated with IV antibiotics with IV Ancef and azithromycin. -Initially started on IV Solu-Medrol, switched to oral prednisone currently on 40 mg daily.   -Pulmonary consultation appreciated.  As recommended, at discharge, I started the patient on azithromycin 250 mg daily for 5 days and prednisone 20 mg daily for 5 days.   -Continue aggressive  bronchodilator therapy, chest physiotherapy with the breathing exercises and incentive spirometry.  -Also trial of Lasix given in the hospital.  Right lung upper lobe tumor -CT scan of the chest on admission showed 2 cm irregular, spiculated nodule with central cavitation in the right upper lobe. -Seen by pulmonary.  Patient will need further studies including MRI brain, outpatient PET scan.  She will follow up with pulmonary as an outpatient. -Seen by palliative care as well.  Outpatient palliative follow-up recommended.  Hypertension:Blood pressure stable on amlodipine.  Hypokalemia/hypomagnesemia: Improved.  Mobility: Encourage ambulation Code Status:  Code Status: DNR   Wound care:    Subjective:  Seen and examined this morning. Pleasant elderly Caucasian female.  Propped up in bed.  Not in distress. Feels ready to go home.  Her son is coming to pick her up today.  Discharge Exam:   Vitals:   04/12/20 1947 04/12/20 2035 04/13/20 0333 04/13/20 0835  BP: (!) 158/81  (!) 151/72 (!) 130/65  Pulse:   73 76  Resp:   16 15  Temp: 97.9 F (36.6 C)  97.8 F (36.6 C) 97.9 F (36.6 C)  TempSrc: Oral   Oral  SpO2:  94% 95% 99%  Weight:   74.9 kg   Height:        Body mass index is 30.2 kg/m.  General exam: Appears calm and comfortable.  Skin: No rashes, lesions or ulcers. HEENT: Atraumatic, normocephalic, supple neck, no obvious bleeding Lungs: Mild scattered rhonchi bilaterally.  No wheezing or crackles. CVS: Regular rate and rhythm, no murmur  GI/Abd soft, nontender, nondistended, bowel sound present CNS: Alert, awake, oriented x3 Psychiatry: Mood appropriate Extremities: No pedal edema, no calf tenderness  Discharge Instructions:   Discharge Instructions    Diet - low sodium heart healthy   Complete by: As directed    Increase activity slowly   Complete by: As directed       Follow-up Information    Glean Hess, MD Follow up.   Specialty:  Internal Medicine Contact information: [address] [place] [address] [phone]        Ottie Glazier, MD Follow up.   Specialty: Pulmonary Disease Contact information: [place] [address] [phone]              Allergies as of 04/13/2020      Reactions   Bee Venom Anaphylaxis   Aspirin Nausea And Vomiting   Codeine Nausea And Vomiting   Morphine And Related Nausea And Vomiting   Cat Hair Extract Other (See Comments)   Runny nose, watery eyes      Medication List    TAKE these medications   acetaminophen 500 MG tablet Commonly known as: TYLENOL Take 2 tablets (1,000 mg total) by mouth 3 (three) times daily as needed. What changed: how much to take   albuterol 108 (90 Base) MCG/ACT inhaler Commonly known as: VENTOLIN HFA INHALE 2 PUFFS INTO THE LUNGS EVERY 6 HOURS AS NEEDED FOR WHEEZING OR SHORTNESS OF BREATH   amLODipine 5 MG tablet Commonly known as: NORVASC TAKE 1 TABLET EVERY DAY What changed: when to take this   atorvastatin 20 MG tablet Commonly known as: LIPITOR TAKE 1 TABLET AT BEDTIME   azithromycin 250 MG tablet Commonly known as: Zithromax Z-Pak Take as directed   calcium carbonate 500 MG chewable tablet Commonly known as: TUMS - dosed in mg elemental calcium Chew 1 tablet (200 mg of elemental calcium total) by mouth 3 (three) times daily as needed for indigestion or heartburn.   cyclobenzaprine 10 MG tablet Commonly known as: FLEXERIL Take 1 tablet (10 mg total) by mouth 2 (two) times daily. What changed:   when to take this  reasons to take this   guaiFENesin-dextromethorphan 100-10 MG/5ML syrup Commonly known as: ROBITUSSIN DM Take 5 mLs by mouth every 4 (four) hours as needed for up to 7 days for cough.   hydrOXYzine 25 MG tablet Commonly known as: ATARAX/VISTARIL Take 25 mg by mouth 2 (two) times daily as needed.   ipratropium 0.02 % nebulizer solution Commonly known as:  ATROVENT Take 2.5 mLs (0.5 mg total) by nebulization 4 (four) times daily.   ipratropium-albuterol 0.5-2.5 (3) MG/3ML Soln Commonly known as: DUONEB Take 3 mLs by nebulization 3 (three) times daily as needed.   levalbuterol 0.63 MG/3ML nebulizer solution Commonly known as: XOPENEX Take 3 mLs (0.63 mg total) by nebulization in the morning, at noon, in the evening, and at bedtime.   omeprazole 40 MG capsule Commonly known as: PRILOSEC Take 40 mg by mouth daily.   predniSONE 20 MG tablet Commonly known as: Deltasone Take 1 tablet (20 mg total) by mouth daily for 5 days.   Trelegy Ellipta 100-62.5-25 MCG/INH Aepb Generic drug: Fluticasone-Umeclidin-Vilant INHALE 1 PUFF INTO THE LUNGS DAILY.       Time coordinating discharge: 35 minutes  The results of significant diagnostics from this hospitalization (including imaging, microbiology, ancillary and laboratory) are listed below for reference.    Procedures and Diagnostic Studies:   DG Chest Port 1 View  Result Date: 04/05/2020 CLINICAL DATA:  74 year old female with a history of shortness of breath EXAM: PORTABLE CHEST 1 VIEW  COMPARISON:  04/03/2020 FINDINGS: Cardiomediastinal silhouette unchanged. Reticulonodular opacities of the right greater than left lung, unchanged from the comparison plain film. No pneumothorax. No pleural effusion. Unchanged right IJ port catheter. IMPRESSION: Similar appearance of the chest x-ray with right greater than left reticulonodular opacities, potentially multifocal infection. Unchanged right IJ port catheter. Electronically Signed   By: Corrie Mckusick D.O.   On: 04/05/2020 15:27   ECHOCARDIOGRAM COMPLETE  Result Date: 04/05/2020    ECHOCARDIOGRAM REPORT   Patient Name:   Carol Gay Date of Exam: 04/05/2020 Medical Rec #:  [id number]        Height:       62.0 in Accession #:    [id number]       Weight:       173.7 lb Date of Birth:  Sep 06, 1946        BSA:          1.801 m Patient Age:    57 years          BP:           143/68 mmHg Patient Gender: F                HR:           100 bpm. Exam Location:  [facility] Procedure: 2D Echo Indications:     Bacteremia  History:         Patient has no prior history of Echocardiogram examinations.                  Advanced Stage COPD; Risk Factors:Current Smoker and                  Hypertension.  Sonographer:     L Thornton-Maynard Referring Phys:  [id number] Wyvonnia Dusky Diagnosing Phys: Ida Rogue MD  Sonographer Comments: Image acquisition challenging due to COPD. IMPRESSIONS  1. Left ventricular ejection fraction, by estimation, is 55 to 60%. The left ventricle has normal function. The left ventricle has no regional wall motion abnormalities. There is mild left ventricular hypertrophy. Left ventricular diastolic parameters are consistent with Grade I diastolic dysfunction (impaired relaxation).  2. Right ventricular systolic function is mildly reduced. The right ventricular size is moderately enlarged. There is severely elevated pulmonary artery systolic pressure. The estimated right ventricular systolic pressure is 71.0 mmHg.  3. No valve vegetation noted. FINDINGS  Left Ventricle: Left ventricular ejection fraction, by estimation, is 55 to 60%. The left ventricle has normal function. The left ventricle has no regional wall motion abnormalities. The left ventricular internal cavity size was normal in size. There is  mild left ventricular hypertrophy. Left ventricular diastolic parameters are consistent with Grade I diastolic dysfunction (impaired relaxation). Right Ventricle: The right ventricular size is moderately enlarged. No increase in right ventricular wall thickness. Right ventricular systolic function is mildly reduced. There is severely elevated pulmonary artery systolic pressure. The tricuspid regurgitant velocity is 4.48 m/s, and with an assumed right atrial pressure of 10 mmHg, the estimated right ventricular systolic pressure is 62.6 mmHg. Left Atrium:  Left atrial size was normal in size. Right Atrium: Right atrial size was normal in size. Pericardium: There is no evidence of pericardial effusion. Mitral Valve: The mitral valve is normal in structure. Normal mobility of the mitral valve leaflets. Mild mitral valve regurgitation. No evidence of mitral valve stenosis. MV peak gradient, 5.4 mmHg. The mean mitral valve gradient is 3.0 mmHg. Tricuspid Valve: The tricuspid valve is normal in structure. Tricuspid  valve regurgitation is mild . No evidence of tricuspid stenosis. Aortic Valve: The aortic valve was not well visualized. Aortic valve regurgitation is not visualized. No aortic stenosis is present. Aortic valve mean gradient measures 5.0 mmHg. Aortic valve peak gradient measures 7.0 mmHg. Aortic valve area, by VTI measures 1.31 cm. Pulmonic Valve: The pulmonic valve was normal in structure. Pulmonic valve regurgitation is not visualized. No evidence of pulmonic stenosis. Aorta: The aortic root is normal in size and structure. Venous: The inferior vena cava is normal in size with less than 50% respiratory variability, suggesting right atrial pressure of 8 mmHg. IAS/Shunts: No atrial level shunt detected by color flow Doppler.  LEFT VENTRICLE PLAX 2D LVIDd:         3.98 cm  Diastology LVIDs:         2.65 cm  LV e' lateral:   6.31 cm/s LV PW:         1.61 cm  LV E/e' lateral: 12.4 LV IVS:        1.55 cm  LV e' medial:    5.33 cm/s LVOT diam:     1.70 cm  LV E/e' medial:  14.7 LV SV:         38 LV SV Index:   21 LVOT Area:     2.27 cm  RIGHT VENTRICLE RV S prime:     14.60 cm/s TAPSE (M-mode): 1.8 cm LEFT ATRIUM         Index LA diam:    3.50 cm 1.94 cm/m  AORTIC VALVE AV Area (Vmax):    1.47 cm AV Area (Vmean):   1.25 cm AV Area (VTI):     1.31 cm AV Vmax:           132.00 cm/s AV Vmean:          104.000 cm/s AV VTI:            0.292 m AV Peak Grad:      7.0 mmHg AV Mean Grad:      5.0 mmHg LVOT Vmax:         85.50 cm/s LVOT Vmean:        57.500 cm/s LVOT  VTI:          0.168 m LVOT/AV VTI ratio: 0.58 MITRAL VALVE                TRICUSPID VALVE MV Area (PHT): 5.00 cm     TR Peak grad:   80.3 mmHg MV Peak grad:  5.4 mmHg     TR Vmax:        448.00 cm/s MV Mean grad:  3.0 mmHg MV Vmax:       1.16 m/s     SHUNTS MV Vmean:      83.0 cm/s    Systemic VTI:  0.17 m MV E velocity: 78.40 cm/s   Systemic Diam: 1.70 cm MV A velocity: 117.00 cm/s MV E/A ratio:  0.67 Ida Rogue MD Electronically signed by Ida Rogue MD Signature Date/Time: 04/05/2020/11:07:53 PM    Final      Labs:   Basic Metabolic Panel: Recent Labs  Lab 04/07/20 0300 04/07/20 0300 04/08/20 0455 04/08/20 0455 04/09/20 0441 04/11/20 0533  NA 138  --  136  --  134* 136  K 3.4*   < > 4.1   < > 5.2* 4.5  CL 101  --  99  --  96* 91*  CO2 30  --  29  --  31 31  GLUCOSE 123*  --  120*  --  106* 74  BUN 32*  --  34*  --  41* 44*  CREATININE 0.78  --  0.75  --  1.00 0.79  CALCIUM 9.7  --  9.8  --  9.6 10.2   < > = values in this interval not displayed.   GFR Estimated Creatinine Clearance: 58.4 mL/min (by C-G formula based on SCr of 0.79 mg/dL). Liver Function Tests: No results for input(s): AST, ALT, ALKPHOS, BILITOT, PROT, ALBUMIN in the last 168 hours. No results for input(s): LIPASE, AMYLASE in the last 168 hours. No results for input(s): AMMONIA in the last 168 hours. Coagulation profile No results for input(s): INR, PROTIME in the last 168 hours.  CBC: Recent Labs  Lab 04/07/20 0300 04/08/20 0455 04/09/20 0441  WBC 7.0 6.6 8.6  HGB 14.0 14.1 14.1  HCT 41.5 43.2 43.0  MCV 89.8 91.5 91.1  PLT 232 234 237   Cardiac Enzymes: No results for input(s): CKTOTAL, CKMB, CKMBINDEX, TROPONINI in the last 168 hours. BNP: Invalid input(s): POCBNP CBG: No results for input(s): GLUCAP in the last 168 hours. D-Dimer No results for input(s): DDIMER in the last 72 hours. Hgb A1c No results for input(s): HGBA1C in the last 72 hours. Lipid Profile No results for input(s):  CHOL, HDL, LDLCALC, TRIG, CHOLHDL, LDLDIRECT in the last 72 hours. Thyroid function studies No results for input(s): TSH, T4TOTAL, T3FREE, THYROIDAB in the last 72 hours.  Invalid input(s): FREET3 Anemia work up No results for input(s): VITAMINB12, FOLATE, FERRITIN, TIBC, IRON, RETICCTPCT in the last 72 hours. Microbiology Recent Results (from the past 240 hour(s))  SARS Coronavirus 2 by RT PCR (hospital order, performed in [facility] hospital lab) Nasopharyngeal Nasopharyngeal Swab     Status: None   Collection Time: 04/03/20  5:04 PM   Specimen: Nasopharyngeal Swab  Result Value Ref Range Status   SARS Coronavirus 2 NEGATIVE NEGATIVE Final    Comment: (NOTE) SARS-CoV-2 target nucleic acids are NOT DETECTED.  The SARS-CoV-2 RNA is generally detectable in upper and lower respiratory specimens during the acute phase of infection. The lowest concentration of SARS-CoV-2 viral copies this assay can detect is 250 copies / mL. A negative result does not preclude SARS-CoV-2 infection and should not be used as the sole basis for treatment or other patient management decisions.  A negative result may occur with improper specimen collection / handling, submission of specimen other than nasopharyngeal swab, presence of viral mutation(s) within the areas targeted by this assay, and inadequate number of viral copies (<250 copies / mL). A negative result must be combined with clinical observations, patient history, and epidemiological information.  Fact Sheet for Patients:   StrictlyIdeas.no  Fact Sheet for Healthcare Providers: BankingDealers.co.za  This test is not yet approved or  cleared by the [place] FDA and has been authorized for detection and/or diagnosis of SARS-CoV-2 by FDA under an Emergency Use Authorization (EUA).  This EUA will remain in effect (meaning this test can be used) for the duration of the COVID-19 declaration  under Section 564(b)(1) of the Act, 21 U.S.C. section 360bbb-3(b)(1), unless the authorization is terminated or revoked sooner.  Performed at [facility], [address] [place], [place] [address]   Culture, blood (routine x 2)     Status: None   Collection Time: 04/03/20  5:23 PM   Specimen: BLOOD  Result Value Ref Range Status   Specimen Description BLOOD  Final  Special Requests NONE  Final   Culture   Final    NO GROWTH 5 DAYS Performed at [facility], [address] [place], [place] [address]    Report Status 04/08/2020 FINAL  Final  Culture, blood (routine x 2)     Status: Abnormal   Collection Time: 04/03/20  5:24 PM   Specimen: BLOOD LEFT HAND  Result Value Ref Range Status   Specimen Description   Final    BLOOD LEFT HAND Performed at [facility], [place] [address] [place], [place] [address]    Special Requests   Final    NONE Performed at [facility], [address] [place], [place] [address]    Culture  Setup Time   Final    GRAM POSITIVE COCCI AEROBIC BOTTLE ONLY CRITICAL RESULT CALLED TO, READ BACK BY AND VERIFIED WITH: ABBY [place]INGTON 04/04/20 AT 1750 BY AR    Culture (A)  Final    STAPHYLOCOCCUS CAPITIS THE SIGNIFICANCE OF ISOLATING THIS ORGANISM FROM A SINGLE SET OF BLOOD CULTURES WHEN MULTIPLE SETS ARE DRAWN IS UNCERTAIN. PLEASE NOTIFY THE MICROBIOLOGY DEPARTMENT WITHIN ONE WEEK IF SPECIATION AND SENSITIVITIES ARE REQUIRED. Performed at [facility], [place] [address], [place], [place] [address]    Report Status 04/05/2020 FINAL  Final  Blood Culture ID Panel (Reflexed)     Status: Abnormal   Collection Time: 04/03/20  5:24 PM  Result Value Ref Range Status   Enterococcus species NOT DETECTED NOT DETECTED Final   Listeria monocytogenes NOT DETECTED NOT DETECTED Final   Staphylococcus species DETECTED (A) NOT DETECTED Final    Comment: Methicillin (oxacillin) susceptible coagulase negative staphylococcus.  Possible blood culture contaminant (unless isolated from more than one blood culture draw or clinical case suggests pathogenicity). No antibiotic treatment is indicated for blood  culture contaminants. CRITICAL RESULT CALLED TO, READ BACK BY AND VERIFIED WITH: ABBY E[place]INGTON 04/04/20 @1730  BY ACR    Staphylococcus aureus (BCID) NOT DETECTED NOT DETECTED Final   Methicillin resistance NOT DETECTED NOT DETECTED Final   Streptococcus species NOT DETECTED NOT DETECTED Final   Streptococcus agalactiae NOT DETECTED NOT DETECTED Final   Streptococcus pneumoniae NOT DETECTED NOT DETECTED Final   Streptococcus pyogenes NOT DETECTED NOT DETECTED Final   Acinetobacter baumannii NOT DETECTED NOT DETECTED Final   Enterobacteriaceae species NOT DETECTED NOT DETECTED Final   Enterobacter cloacae complex NOT DETECTED NOT DETECTED Final   Escherichia coli NOT DETECTED NOT DETECTED Final   Klebsiella oxytoca NOT DETECTED NOT DETECTED Final   Klebsiella pneumoniae NOT DETECTED NOT DETECTED Final   Proteus species NOT DETECTED NOT DETECTED Final   Serratia marcescens NOT DETECTED NOT DETECTED Final   Haemophilus influenzae NOT DETECTED NOT DETECTED Final   Neisseria meningitidis NOT DETECTED NOT DETECTED Final   Pseudomonas aeruginosa NOT DETECTED NOT DETECTED Final   Candida albicans NOT DETECTED NOT DETECTED Final   Candida glabrata NOT DETECTED NOT DETECTED Final   Candida krusei NOT DETECTED NOT DETECTED Final   Candida parapsilosis NOT DETECTED NOT DETECTED Final   Candida tropicalis NOT DETECTED NOT DETECTED Final    Comment: Performed at [facility], [address], [place],  [address]  Urine Culture     Status: None   Collection Time: 04/05/20 11:50 AM   Specimen: Urine, Random  Result Value Ref Range Status   Specimen Description   Final    URINE, RANDOM Performed at [facility], [place], [place]  [address]    Special Requests   Final     NONE Performed at [facility], [address] [place], [place] [address]    Culture   Final    NO GROWTH Performed at [facility], [place] [address], [place], [place] [address]    Report Status 04/06/2020 FINAL  Final  CULTURE, BLOOD (ROUTINE X 2) w Reflex to ID Panel     Status: None   Collection Time: 04/05/20  1:17 PM   Specimen: BLOOD  Result Value Ref Range Status   Specimen Description BLOOD RAC  Final   Special Requests   Final    BOTTLES DRAWN AEROBIC AND ANAEROBIC Blood Culture adequate volume   Culture   Final    NO GROWTH 5 DAYS Performed at [facility], [address] [place], [place] [address]    Report Status 04/10/2020 FINAL  Final  CULTURE, BLOOD (ROUTINE X 2) w Reflex to ID Panel     Status: None   Collection Time: 04/05/20  1:21 PM   Specimen: BLOOD  Result Value Ref Range Status   Specimen Description BLOOD LAC  Final   Special Requests   Final    BOTTLES DRAWN AEROBIC AND ANAEROBIC Blood Culture adequate volume   Culture   Final    NO GROWTH 5 DAYS Performed at [facility], [address] [place], [place] [address]    Report Status 04/10/2020 FINAL  Final  MRSA PCR Screening     Status: None   Collection Time: 04/05/20  2:45 PM   Specimen: Nasopharyngeal  Result Value Ref Range Status   MRSA by PCR NEGATIVE NEGATIVE Final    Comment:        The GeneXpert MRSA Assay (FDA approved for NASAL specimens only), is one component of a comprehensive MRSA colonization surveillance program. It is not intended to diagnose MRSA infection nor to guide or monitor treatment for MRSA infections. Performed at [facility], [address] [place], [place] [address]   Respiratory Panel by PCR     Status: None   Collection Time: 04/05/20  2:45 PM   Specimen: Nasopharyngeal Swab; Respiratory  Result Value Ref Range Status   Adenovirus NOT DETECTED NOT DETECTED Final   Coronavirus 229E NOT DETECTED NOT DETECTED  Final    Comment: (NOTE) The Coronavirus on the Respiratory Panel, DOES NOT test for the novel  Coronavirus (2019 nCoV)    Coronavirus HKU1 NOT DETECTED NOT DETECTED Final   Coronavirus NL63 NOT DETECTED NOT DETECTED Final   Coronavirus OC43 NOT DETECTED NOT DETECTED Final   Metapneumovirus NOT DETECTED NOT DETECTED Final   Rhinovirus / Enterovirus NOT DETECTED NOT DETECTED Final   Influenza A NOT DETECTED NOT DETECTED Final   Influenza B NOT DETECTED NOT DETECTED Final   Parainfluenza Virus 1 NOT DETECTED NOT DETECTED Final   Parainfluenza Virus 2 NOT DETECTED NOT DETECTED Final   Parainfluenza Virus 3 NOT DETECTED NOT DETECTED Final   Parainfluenza Virus 4 NOT DETECTED NOT DETECTED Final   Respiratory Syncytial Virus NOT DETECTED NOT DETECTED Final   Bordetella pertussis NOT DETECTED NOT DETECTED Final   Chlamydophila pneumoniae NOT DETECTED NOT DETECTED Final   Mycoplasma pneumoniae NOT DETECTED NOT DETECTED Final    Comment: Performed at [facility], [place]. [address] [place],  [address]  Expectorated sputum assessment w rflx to resp cult     Status: None   Collection Time: 04/05/20  5:50 PM  Specimen: Sputum  Result Value Ref Range Status   Specimen Description SPUTUM  Final   Special Requests NONE  Final   Sputum evaluation   Final    THIS SPECIMEN IS ACCEPTABLE FOR SPUTUM CULTURE Performed at [facility], [address], [place], [place] [address]    Report Status 04/06/2020 FINAL  Final  Culture, respiratory     Status: None   Collection Time: 04/05/20  5:50 PM   Specimen: SPU  Result Value Ref Range Status   Specimen Description   Final    SPUTUM Performed at [facility], [address] [place], [place] [address]    Special Requests   Final    NONE Reflexed from [id number] Performed at [facility], [address] [place], [place] [address]    Gram Stain   Final    RARE WBC PRESENT, PREDOMINANTLY MONONUCLEAR RARE  SQUAMOUS EPITHELIAL CELLS PRESENT NO ORGANISMS SEEN Performed at [facility], [place] [address] [place], [place] [address]    Culture   Final    Consistent with normal respiratory flora. FUNGUS (MOLD) ISOLATED, PROBABLE CONTAMINANT/COLONIZER (SAPROPHYTE). CONTACT MICROBIOLOGY IF FURTHER IDENTIFICATION REQUIRED [phone].    Report Status 04/08/2020 FINAL  Final  Expectorated sputum assessment w rflx to resp cult     Status: None   Collection Time: 04/10/20 11:26 AM   Specimen: Expectorated Sputum  Result Value Ref Range Status   Specimen Description EXPECTORATED SPUTUM  Final   Special Requests NONE  Final   Sputum evaluation   Final    THIS SPECIMEN IS ACCEPTABLE FOR SPUTUM CULTURE Performed at [facility], [address], [place], [place] [address]    Report Status 04/10/2020 FINAL  Final  Culture, respiratory     Status: None (Preliminary result)   Collection Time: 04/10/20 11:26 AM  Result Value Ref Range Status   Specimen Description   Final    EXPECTORATED SPUTUM Performed at [facility], [address], [place], [place] [address]    Special Requests   Final    NONE Reflexed from [id number] Performed at [facility], [address] [place], [place] [address]    Gram Stain   Final    FEW WBC PRESENT, PREDOMINANTLY PMN ABUNDANT GRAM NEGATIVE RODS RARE GRAM POSITIVE COCCI    Culture   Final    ABUNDANT GRAM NEGATIVE RODS RARE MOLD CULTURE REINCUBATED FOR BETTER GROWTH Performed at [facility], [employer] [address] [place], [place] [address]    Report Status PENDING  Incomplete    Please note: You were cared for by a hospitalist during your hospital stay. Once you are discharged, your primary care physician will handle any further medical issues. Please note that NO REFILLS for any discharge medications will be authorized once you are discharged, as it is imperative that you return to your primary care physician (or establish a  relationship with a primary care physician if you do not have one) for your post hospital discharge needs so that they can reassess your need for medications and monitor your lab values.  Signed: Marlowe Aschoff Naphtali Zywicki  [facility] 04/13/2020, 11:58 AM

## 2020-04-13 NOTE — Progress Notes (Signed)
Pulmonary Medicine          Date: 04/13/2020,   MRN# [id number] Carol Gay Aug 21, 1946     AdmissionWeight: 77.3 kg                 CurrentWeight: 74.9 kg   Referring physician: Dr Jimmye Norman    CHIEF COMPLAINT:   Acute on chronic hypoxemic respiratory failure   SUBJECTIVE   Patient weaned to 4L/min. She is optimized for d/c home this am with plan for outpatient follow up in pulmonary clinic.    Please d/c home on 20mg  prednisone x 5 days and 250mg  zithromax x5d  PAST MEDICAL HISTORY   Past Medical History:  Diagnosis Date  . Aortic atherosclerosis ([place])   . Arm pain   . Asthma   . Benign neoplasm of rectosigmoid junction   . Benign neoplasm of transverse colon   . CAD in native artery   . Cancer ([place]) 09/2015   rectum  . Cigarette smoker   . Claustrophobia   . Colon cancer ([place])   . 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 COPD (chronic obstructive pulmonary disease) ([place])   . DDD (degenerative disc disease), lumbar       . Dermatophytoses   . Dyslipidemia   . H/O blood clots    left leg, veins stripped  . Hemorrhoids   . Hypertension   . Last menstrual period (LMP) > 10 days ago 1994  . Myocardial infarction [facility])    "mild" - age 45  . Personal history of chemotherapy   . Personal history of radiation therapy   . Pulmonary emphysema ([place])   . Rectal bleeding   . Shortness of breath dyspnea   . Smokers' cough ([place])   . Spinal headache    with one of my children's birth  . Vertigo   . Wears dentures    has full upper and lower - doesn't wear     SURGICAL HISTORY   Past Surgical History:  Procedure Laterality Date  . APPENDECTOMY    . BREAST BIOPSY Left 1970's   neg  . COLONOSCOPY WITH PROPOFOL N/A 11/06/2015   Procedure: COLONOSCOPY WITH PROPOFOL;  Surgeon: Lucilla Lame, MD;  Location: [facility];  Service: Endoscopy;  Laterality: N/A;  LEAVE PT EARLY  . EXPLORATORY LAPAROTOMY    . LESION  EXCISION  11/03/15   Anal - Dr Dahlia Byes, [facility]  . LESION REMOVAL  1970's   from tailbone  . [place] SURGERY  1997  . OVARIAN CYST REMOVAL Left   . POLYPECTOMY  11/06/2015   Procedure: POLYPECTOMY;  Surgeon: Lucilla Lame, MD;  Location: [facility];  Service: Endoscopy;;  . PORTACATH PLACEMENT N/A 12/10/2015   Procedure: INSERTION PORT-A-CATH;  Surgeon: Jules Husbands, MD;  Location: [facility];  Service: General;  Laterality: N/A;  . RECTAL EXAM UNDER ANESTHESIA N/A 06/23/2016   Procedure: RECTAL EXAM UNDER ANESTHESIA;  Surgeon: Jules Husbands, MD;  Location: [facility];  Service: General;  Laterality: N/A;  . VEIN LIGATION AND STRIPPING       FAMILY HISTORY   Family History  Problem Relation Age of Onset  . Cancer Mother        lymphoma  . Heart disease Mother   . Cancer Maternal Grandmother        breast  . Birth defects Maternal Grandmother      SOCIAL HISTORY   Social  History   Tobacco Use  . Smoking status: Former Smoker    Packs/day: 1.00    Years: 57.00    Pack years: 57.00    Types: Cigarettes    Quit date: 03/30/2016    Years since quitting: 4.0  . Smokeless tobacco: Never Used  . Tobacco comment: Quit in July 2017  Vaping Use  . Vaping Use: Never used  Substance Use Topics  . Alcohol use: No    Alcohol/week: 0.0 standard drinks  . Drug use: No     MEDICATIONS    Home Medication:    Current Medication:  Current Facility-Administered Medications:  .  0.9 %  sodium chloride infusion, , Intravenous, PRN, Wyvonnia Dusky, MD, Stopped at 04/08/20 1900 .  acetaminophen (TYLENOL) tablet 650 mg, 650 mg, Oral, Q6H PRN, 650 mg at 04/13/20 1017 **OR** acetaminophen (TYLENOL) suppository 650 mg, 650 mg, Rectal, Q6H PRN, Mansy, Jan A, MD .  amLODipine (NORVASC) tablet 5 mg, 5 mg, Oral, Daily, Mansy, Jan A, MD, 5 mg at 04/13/20 1017 .  atorvastatin (LIPITOR) tablet 20 mg, 20 mg, Oral, QHS, Mansy, Jan A, MD, 20 mg at 04/12/20 2056 .  calcium carbonate  (TUMS - dosed in mg elemental calcium) chewable tablet 200 mg of elemental calcium, 1 tablet, Oral, TID PRN, Mansy, Jan A, MD, 200 mg of elemental calcium at 04/09/20 2140 .  Chlorhexidine Gluconate Cloth 2 % PADS 6 each, 6 each, Topical, Daily, Wyvonnia Dusky, MD, 6 each at 04/12/20 1007 .  cyclobenzaprine (FLEXERIL) tablet 10 mg, 10 mg, Oral, BID, Mansy, Jan A, MD, 10 mg at 04/13/20 1017 .  enoxaparin (LOVENOX) injection 40 mg, 40 mg, Subcutaneous, Q24H, Mansy, Jan A, MD, 40 mg at 04/12/20 2056 .  furosemide (LASIX) injection 40 mg, 40 mg, Intravenous, Daily, Lanney Gins, Brentley Landfair, MD, 40 mg at 04/13/20 1017 .  guaiFENesin (MUCINEX) 12 hr tablet 600 mg, 600 mg, Oral, BID, Mansy, Jan A, MD, 600 mg at 04/13/20 1018 .  guaiFENesin-dextromethorphan (ROBITUSSIN DM) 100-10 MG/5ML syrup 5 mL, 5 mL, Oral, Q4H PRN, Mansy, Jan A, MD .  hydrOXYzine (ATARAX/VISTARIL) tablet 25 mg, 25 mg, Oral, BID PRN, Mansy, Jan A, MD .  ipratropium-albuterol (DUONEB) 0.5-2.5 (3) MG/3ML nebulizer solution 3 mL, 3 mL, Nebulization, TID, Wyvonnia Dusky, MD, 3 mL at 04/13/20 4656 .  labetalol (NORMODYNE) injection 20 mg, 20 mg, Intravenous, Q3H PRN, Mansy, Jan A, MD .  levalbuterol Penne Lash) nebulizer solution 0.63 mg, 0.63 mg, Nebulization, Q6H PRN, Mansy, Jan A, MD, 0.63 mg at 04/05/20 1411 .  magnesium hydroxide (MILK OF MAGNESIA) suspension 30 mL, 30 mL, Oral, Daily PRN, Mansy, Jan A, MD .  ondansetron (ZOFRAN) tablet 4 mg, 4 mg, Oral, Q6H PRN **OR** ondansetron (ZOFRAN) injection 4 mg, 4 mg, Intravenous, Q6H PRN, Mansy, Jan A, MD, 4 mg at 04/06/20 2131 .  pantoprazole (PROTONIX) EC tablet 40 mg, 40 mg, Oral, Daily, Ghimire, Kuber, MD, 40 mg at 04/13/20 1017 .  predniSONE (DELTASONE) tablet 20 mg, 20 mg, Oral, Q breakfast, Jaquayla Hege, MD, 20 mg at 04/13/20 1017 .  traZODone (DESYREL) tablet 25 mg, 25 mg, Oral, QHS PRN, Mansy, Jan A, MD, 25 mg at 04/12/20 2057  Facility-Administered Medications Ordered in Other  Encounters:  .  albuterol (PROVENTIL) (2.5 MG/3ML) 0.083% nebulizer solution 2.5 mg, 2.5 mg, Nebulization, Once, Laverle Hobby, MD    ALLERGIES   Bee venom, Aspirin, Codeine, Morphine and related, and Cat hair extract     REVIEW OF SYSTEMS  Review of Systems:  Gen:  Denies  fever, sweats, chills weigh loss  HEENT: Denies blurred vision, double vision, ear pain, eye pain, hearing loss, nose bleeds, sore throat Cardiac:  No dizziness, chest pain or heaviness, chest tightness,edema Resp: reports cough or sputum porduction, shortness of breath,wheezing, hemoptysis,  Gi: Denies swallowing difficulty, stomach pain, nausea or vomiting, diarrhea, constipation, bowel incontinence Gu:  Denies bladder incontinence, burning urine Ext:   Denies Joint pain, stiffness or swelling Skin: Denies  skin rash, easy bruising or bleeding or hives Endoc:  Denies polyuria, polydipsia , polyphagia or weight change Psych:   Denies depression, insomnia or hallucinations   Other:  All other systems negative   VS: BP (!) 130/65   Pulse 76   Temp 97.9 F (36.6 C) (Oral)   Resp 15   Ht 5\' 2"  (1.575 m)   Wt 74.9 kg   SpO2 99%   BMI 30.20 kg/m      PHYSICAL EXAM    GENERAL:NAD, no fevers, chills, no weakness no fatigue HEAD: Normocephalic, atraumatic.  EYES: Pupils equal, round, reactive to light. Extraocular muscles intact. No scleral icterus.  MOUTH: Moist mucosal membrane. Dentition intact. No abscess noted.  EAR, NOSE, THROAT: Clear without exudates. No external lesions.  NECK: Supple. No thyromegaly. No nodules. No JVD.  PULMONARY: Diffuse coarse rhonchi right sided +wheezes CARDIOVASCULAR: S1 and S2. Regular rate and rhythm. No murmurs, rubs, or gallops. No edema. Pedal pulses 2+ bilaterally.  GASTROINTESTINAL: Soft, nontender, nondistended. No masses. Positive bowel sounds. No hepatosplenomegaly.  MUSCULOSKELETAL: No swelling, clubbing, or edema. Range of motion full in all  extremities.  NEUROLOGIC: Cranial nerves II through XII are intact. No gross focal neurological deficits. Sensation intact. Reflexes intact.  SKIN: No ulceration, lesions, rashes, or cyanosis. Skin warm and dry. Turgor intact.  PSYCHIATRIC: Mood, affect within normal limits. The patient is awake, alert and oriented x 3. Insight, judgment intact.       IMAGING    DG Chest 1 View  Result Date: 04/03/2020 CLINICAL DATA:  Shortness of breath EXAM: CHEST  1 VIEW COMPARISON:  01/28/2020 FINDINGS: Right-sided chest port in stable positioning. Heart size is normal. Atherosclerotic calcification of the aortic knob. Interval development of patchy and interstitial opacities within the mid to lower aspect of the right lung. Left lung is hyperexpanded and clear. Scattered granulomas are unchanged. No pleural effusion or pneumothorax identified. IMPRESSION: Interval development of patchy and interstitial opacities within the mid to lower aspect of the right lung. Findings suspicious for pneumonia. Radiographic follow-up to resolution is recommended. Electronically Signed   By: Davina Poke D.O.   On: 04/03/2020 16:24   CT CHEST W CONTRAST  Result Date: 04/07/2020 CLINICAL DATA:  Progressive hypoxemia EXAM: CT CHEST WITH CONTRAST TECHNIQUE: Multidetector CT imaging of the chest was performed during intravenous contrast administration. CONTRAST:  34mL OMNIPAQUE IOHEXOL 300 MG/ML  SOLN COMPARISON:  CT 07/02/2016, radiograph 04/05/2020 FINDINGS: Cardiovascular: Cardiac size at the upper limits of normal. Trace pericardial thickening similar to priors. Coronary artery calcifications are present. Dense calcifications present at the aortic root and throughout the thoracic aorta. Aorta is normal caliber. No acute luminal abnormality nor periaortic stranding or hemorrhage. Normal 3 vessel branching of the aortic arch. Extensive calcification of the proximal great vessels. Central pulmonary arteries are borderline  enlarged. No large central or lobar filling defects are seen on this limited, non tailored examination of the pulmonary arteries. Right IJ approach Port-A-Cath tip terminates at the lower SVC. Mediastinum/Nodes: Mediastinal  and hilar adenopathy: Index nodes include pretracheal lymph node measuring up to 2.5 cm in size (2/48), 13 mm right hilar lymph node (2/74), 10 mm subcarinal lymph node (2/69). No acute abnormality of the trachea or esophagus. Slight rightward mediastinal shift likely related to volume loss in the right hemithorax. Thyroid gland and thoracic inlet are unremarkable. Lungs/Pleura: Extensive centrilobular and paraseptal emphysematous changes throughout the lungs. Chronic areas of subpleural reticulation and fibrotic change most pronounced in the right hemithorax. There is an irregular, spiculated nodule with some central cavitation measuring up to 2 x 2 x 1.3 cm in size (2/63). A more dense area of consolidated lung and volume loss with underlying heterogeneous enhancement is noted in the right middle lobe along the paramediastinal border with some associated air bronchograms more centrally. No other concerning pulmonary nodules or masses. Scattered calcified granulomata are noted throughout both lungs. Chronic area of scarring in the left lung base with some likely adjacent subsegmental atelectasis. Upper Abdomen: Right renal scarring. No acute abnormalities present in the visualized portions of the upper abdomen. Musculoskeletal: No acute osseous abnormality or suspicious osseous lesion. Degenerative changes in the spine and shoulders. No suspicious chest wall lesions. IMPRESSION: 1. Irregular, spiculated nodule with some central cavitation in the right upper lobe measuring up to 2 cm in size, concerning for primary bronchogenic malignancy or less likely metastasis. Cavitary pneumonia is less favored given the additional findings in the chest. 2. More dense area of consolidated lung and volume  loss with underlying heterogeneous enhancement in the right middle lobe along the paramediastinal border with some associated air bronchograms more centrally. Certainly could reflect an acute consolidative airspace process/pneumonia though underlying mass lesion is not excluded. 3. Mediastinal and hilar adenopathy, concerning for metastatic disease. 4. Borderline enlarged central pulmonary arteries, which can be seen with pulmonary arterial hypertension. 5. Aortic Atherosclerosis (ICD10-I70.0) 6. Emphysema (ICD10-J43.9). These results will be called to the ordering clinician or representative by the Radiologist Assistant, and communication documented in the PACS or [employer]. Elevated Electronically Signed   By: Lovena Le M.D.   On: 04/07/2020 19:37   DG Chest Port 1 View  Result Date: 04/10/2020 CLINICAL DATA:  Respiratory failure EXAM: PORTABLE CHEST 1 VIEW COMPARISON:  04/05/2020, 04/07/2020 FINDINGS: Right-sided chest port remains in place. Heart size is normal. Atherosclerotic calcification of the aortic knob. Masslike thickening of the lower right paratracheal stripe compatible with known mediastinal lymphadenopathy. Right middle lobe atelectasis/consolidation. Nodular opacities in the right mid lung. Streaky left basilar atelectasis. Emphysematous changes to the lung bases. No large pleural fluid collection. No pneumothorax. IMPRESSION: 1. No appreciable interval change from prior. Persistent right middle lobe atelectasis/consolidation. 2. Nodular opacities in the right mid lung, as described on recent CT. 3. Mediastinal lymphadenopathy. Electronically Signed   By: Davina Poke D.O.   On: 04/10/2020 08:19   DG Chest Port 1 View  Result Date: 04/05/2020 CLINICAL DATA:  74 year old female with a history of shortness of breath EXAM: PORTABLE CHEST 1 VIEW COMPARISON:  04/03/2020 FINDINGS: Cardiomediastinal silhouette unchanged. Reticulonodular opacities of the right greater than left lung,  unchanged from the comparison plain film. No pneumothorax. No pleural effusion. Unchanged right IJ port catheter. IMPRESSION: Similar appearance of the chest x-ray with right greater than left reticulonodular opacities, potentially multifocal infection. Unchanged right IJ port catheter. Electronically Signed   By: Corrie Mckusick D.O.   On: 04/05/2020 15:27   ECHOCARDIOGRAM COMPLETE  Result Date: 04/05/2020    ECHOCARDIOGRAM REPORT   Patient  Name:   Westley Hummer Date of Exam: 04/05/2020 Medical Rec #:  [id number]        Height:       62.0 in Accession #:    [id number]       Weight:       173.7 lb Date of Birth:  September 30, 1945        BSA:          1.801 m Patient Age:    14 years         BP:           143/68 mmHg Patient Gender: F                HR:           100 bpm. Exam Location:  [facility] Procedure: 2D Echo Indications:     Bacteremia  History:         Patient has no prior history of Echocardiogram examinations.                  Advanced Stage COPD; Risk Factors:Current Smoker and                  Hypertension.  Sonographer:     L Thornton-Maynard Referring Phys:  [id number] Wyvonnia Dusky Diagnosing Phys: Ida Rogue MD  Sonographer Comments: Image acquisition challenging due to COPD. IMPRESSIONS  1. Left ventricular ejection fraction, by estimation, is 55 to 60%. The left ventricle has normal function. The left ventricle has no regional wall motion abnormalities. There is mild left ventricular hypertrophy. Left ventricular diastolic parameters are consistent with Grade I diastolic dysfunction (impaired relaxation).  2. Right ventricular systolic function is mildly reduced. The right ventricular size is moderately enlarged. There is severely elevated pulmonary artery systolic pressure. The estimated right ventricular systolic pressure is 24.2 mmHg.  3. No valve vegetation noted. FINDINGS  Left Ventricle: Left ventricular ejection fraction, by estimation, is 55 to 60%. The left ventricle has normal function. The  left ventricle has no regional wall motion abnormalities. The left ventricular internal cavity size was normal in size. There is  mild left ventricular hypertrophy. Left ventricular diastolic parameters are consistent with Grade I diastolic dysfunction (impaired relaxation). Right Ventricle: The right ventricular size is moderately enlarged. No increase in right ventricular wall thickness. Right ventricular systolic function is mildly reduced. There is severely elevated pulmonary artery systolic pressure. The tricuspid regurgitant velocity is 4.48 m/s, and with an assumed right atrial pressure of 10 mmHg, the estimated right ventricular systolic pressure is 35.3 mmHg. Left Atrium: Left atrial size was normal in size. Right Atrium: Right atrial size was normal in size. Pericardium: There is no evidence of pericardial effusion. Mitral Valve: The mitral valve is normal in structure. Normal mobility of the mitral valve leaflets. Mild mitral valve regurgitation. No evidence of mitral valve stenosis. MV peak gradient, 5.4 mmHg. The mean mitral valve gradient is 3.0 mmHg. Tricuspid Valve: The tricuspid valve is normal in structure. Tricuspid valve regurgitation is mild . No evidence of tricuspid stenosis. Aortic Valve: The aortic valve was not well visualized. Aortic valve regurgitation is not visualized. No aortic stenosis is present. Aortic valve mean gradient measures 5.0 mmHg. Aortic valve peak gradient measures 7.0 mmHg. Aortic valve area, by VTI measures 1.31 cm. Pulmonic Valve: The pulmonic valve was normal in structure. Pulmonic valve regurgitation is not visualized. No evidence of pulmonic stenosis. Aorta: The aortic root is normal in size and structure. Venous: The inferior  vena cava is normal in size with less than 50% respiratory variability, suggesting right atrial pressure of 8 mmHg. IAS/Shunts: No atrial level shunt detected by color flow Doppler.  LEFT VENTRICLE PLAX 2D LVIDd:         3.98 cm  Diastology  LVIDs:         2.65 cm  LV e' lateral:   6.31 cm/s LV PW:         1.61 cm  LV E/e' lateral: 12.4 LV IVS:        1.55 cm  LV e' medial:    5.33 cm/s LVOT diam:     1.70 cm  LV E/e' medial:  14.7 LV SV:         38 LV SV Index:   21 LVOT Area:     2.27 cm  RIGHT VENTRICLE RV S prime:     14.60 cm/s TAPSE (M-mode): 1.8 cm LEFT ATRIUM         Index LA diam:    3.50 cm 1.94 cm/m  AORTIC VALVE AV Area (Vmax):    1.47 cm AV Area (Vmean):   1.25 cm AV Area (VTI):     1.31 cm AV Vmax:           132.00 cm/s AV Vmean:          104.000 cm/s AV VTI:            0.292 m AV Peak Grad:      7.0 mmHg AV Mean Grad:      5.0 mmHg LVOT Vmax:         85.50 cm/s LVOT Vmean:        57.500 cm/s LVOT VTI:          0.168 m LVOT/AV VTI ratio: 0.58 MITRAL VALVE                TRICUSPID VALVE MV Area (PHT): 5.00 cm     TR Peak grad:   80.3 mmHg MV Peak grad:  5.4 mmHg     TR Vmax:        448.00 cm/s MV Mean grad:  3.0 mmHg MV Vmax:       1.16 m/s     SHUNTS MV Vmean:      83.0 cm/s    Systemic VTI:  0.17 m MV E velocity: 78.40 cm/s   Systemic Diam: 1.70 cm MV A velocity: 117.00 cm/s MV E/A ratio:  0.67 Ida Rogue MD Electronically signed by Ida Rogue MD Signature Date/Time: 04/05/2020/11:07:53 PM    Final              Media Information   Document Information  Photos    04/08/2020 17:22  Attached To:  Hospital Encounter on 04/03/20  Source Information  Ottie Glazier, MD  [facility]       ASSESSMENT/PLAN   Acute on chronic hypoxemic respiratory failure  - due to right lung community acquired pneumonia vs AECOPD due to malingancy  -bacterial/viral etiology workup is negative  -respiratory culture via expectorated sputum negative -Repeat blood culturues - 1 + staph capitis likely contaminant -hx of squamous stage 3 rectal CA- s/p chemoradiotherapy -RVP-negative -MRSA nasal -negative -Procalcitonin trend -Legionella and strep pneumo negative-Bnegative  -CXR reviewed as above with RLL  infiltrate -Swallow evaluation to r/o recurrent aspiration due to RLL inifiltrate - narrow Antimicrobial regimen post above workup  I have discussed CT chest findings with patient at length , she will need additional workup  including MRI brain, outpatient PET.  Plan is to continue to improve her respiratory status so she can go home and complete cancer workup.  Will stop Ancef   Community acquired pneumonia  - present on admission   - etiology not clear at this time - possible viral/bacterial vs aspiration related as above  - Vanc/Cefepime >>Zithromax/Ancef>>full course completed  -Solumedrol >>pred  - s/p IVF-dc/d  -repeat blood cx -noted legionella/strep pneumo -RVP-negative  - repeat CXR in am  Reviewed ABG with hypoxemia without hypercapnia   Severe acute exacerbation of COPD  - agree with current COPD carepath  - continue to wean steroids-changing solumedrol to prednisone 40mg  with taper  - IS - I have encouraged patient to keep working with spirometer - currently tV at 850cc per breath  - c/w antibiotics  - DuoNEb therapy per RT   Chronic hypoxemia   - PT/OT and consider palliative care    - monitor diuresis-have diuresed >2200cc overnight, will decrease lasix to 20 daily   - metaneb for recruitment maneuvers       Thank you for allowing me to participate in the care of this patient.   Patient/Family are satisfied with care plan and all questions have been answered.   This document was prepared using Dragon voice recognition software and may include unintentional dictation errors.     Ottie Glazier, M.D.  Division of [place]

## 2020-04-13 NOTE — Discharge Instructions (Signed)

## 2020-04-14 ENCOUNTER — Ambulatory Visit: Payer: Self-pay | Admitting: General Practice

## 2020-04-14 ENCOUNTER — Telehealth: Payer: Self-pay | Admitting: Internal Medicine

## 2020-04-14 ENCOUNTER — Telehealth: Payer: Self-pay

## 2020-04-14 ENCOUNTER — Other Ambulatory Visit: Payer: Self-pay

## 2020-04-14 DIAGNOSIS — E785 Hyperlipidemia, unspecified: Secondary | ICD-10-CM

## 2020-04-14 DIAGNOSIS — I2510 Atherosclerotic heart disease of native coronary artery without angina pectoris: Secondary | ICD-10-CM

## 2020-04-14 DIAGNOSIS — Z9981 Dependence on supplemental oxygen: Secondary | ICD-10-CM

## 2020-04-14 DIAGNOSIS — J9621 Acute and chronic respiratory failure with hypoxia: Secondary | ICD-10-CM

## 2020-04-14 DIAGNOSIS — J441 Chronic obstructive pulmonary disease with (acute) exacerbation: Secondary | ICD-10-CM

## 2020-04-14 DIAGNOSIS — I10 Essential (primary) hypertension: Secondary | ICD-10-CM

## 2020-04-14 LAB — CULTURE, RESPIRATORY W GRAM STAIN

## 2020-04-14 NOTE — Telephone Encounter (Signed)
Placed new orders for Home Health for pt with [facility].   CM

## 2020-04-14 NOTE — Telephone Encounter (Signed)
Transition Care Management Follow-up Telephone Call  Date of discharge and from where: 04/13/20 [facility]  How have you been since you were released from the hospital? Pt states she is still feeling weak, especially when walking. Pt hopes to build strength in legs. Uses continuous O2 6L   Any questions or concerns? No   Items Reviewed:  Did the pt receive and understand the discharge instructions provided? Yes   Medications obtained and verified? Yes   Any new allergies since your discharge? No   Dietary orders reviewed? Yes  Do you have support at home? Yes  - home health and neighbors  Functional Questionnaire: (I = Independent and D = Dependent) ADLs: D  Bathing/Dressing- D  Meal Prep- D   Eating- I  Maintaining continence- I  Transferring/Ambulation- I with walker  Managing Meds- I  Follow up appointments reviewed:   PCP Hospital f/u appt confirmed? Yes  Scheduled to see Dr. Army Melia on 05/05/20.  [facility] f/u appt confirmed? Yes  Scheduled to see pulmonology on 05/05/20.  Are transportation arrangements needed? No  - pt's so to transport but otherwise she does not drive  If their condition worsens, is the pt aware to call PCP or go to the Emergency Dept.? Yes  Was the patient provided with contact information for the PCP's office or ED? Yes  Was to pt encouraged to call back with questions or concerns? Yes

## 2020-04-14 NOTE — Patient Instructions (Signed)
Visit Information  Goals Addressed              This Visit's Progress   .  COMPLETED: RNCM: Pt-"I don't have transportation to get to my appointments" (pt-stated)        [name] (see longitudinal plan of care for additional care plan information)  Current Barriers:  Currently has adequate transportation.  . Knowledge Deficits related to how to obtain adequate transportation to get to MD appointments  . Care Coordination needs related to transportation  in a patient with COPD, CAD, HTN, HLD (disease states) . Lacks caregiver support. Son lives in [place]. Neighbors help when can but are very busy  . Transportation barriers  Nurse Case Manager Clinical Goal(s):  Carol Gay Kitchen Over the next 60 days, patient will verbalize understanding of plan for working with the care guide team to get adequate transportation to her appointments  . Over the next 5 days, patient will work with [facility], Care guides and pcp to address needs related to lack of transportation to appointments  . Over the next 5 days, patient will work with care guides  (community agency) to get adequate transportation to the post discharge follow up visit at the pcp on 02/11/2020  Interventions:  . Inter-disciplinary care team collaboration (see longitudinal plan of care) . Advised patient to work with CCM team to get adequate transportation to MD appointments  . Provided education to patient re: care guide referral being placed to help with resources in [place] to help with transportation and other needs she may have  . Care Guide referral for immediate need for transportation to her post discharge appointment on 02/11/2020 with Dr. Army Melia.   Patient Self Care Activities:  . Patient verbalizes understanding of plan to work with the CCM team to meet transportation needs . Attends all scheduled provider appointments . Calls provider office for new concerns or questions . Unable to independently get to appointments due to  inability to drive and not having adequate alternate transportation at this time.  [name] social connections  Initial goal documentation     .  RNCM: Pt-"They had to increase my oxygen to 6 liters" (pt-stated)        CARE PLAN ENTRY (see longtitudinal plan of care for additional care plan information)  Current Barriers:  . Chronic Disease Management support, education, and care coordination needs related to CAD, HTN, HLD, and COPD . Lack of support system- son lives in [place] . Transportation barriers . NO blood pressure cuff to take blood pressure (has one ordered)  Clinical Goal(s) related to CAD, HTN, HLD, and COPD:  Over the next 120 days, patient will:  . Work with the care management team to address educational, disease management, and care coordination needs  . Begin or continue self health monitoring activities as directed today Measure and record blood pressure 2 times per week and adhere to a heart healthy diet . Call provider office for new or worsened signs and symptoms Blood pressure findings outside established parameters, Oxygen saturation lower than established parameter, Shortness of breath, and New or worsened symptom related to HLD/CAD and other chronic conditions  . Call care management team with questions or concerns . Verbalize basic understanding of patient centered plan of care established today  Interventions related to CAD, HTN, HLD, and COPD:  . Evaluation of current treatment plans and patient's adherence to plan as established by provider.  Post discharge assessment: Inpatient 04-03-2020 to 04-13-2020 for COPD exacerbation with shortness  of breath. The patient was using 6 liters of oxygen via [place] at home (increased to 11 liters high flow while in the hospital until taper back down to baseline), she was discharged home on 6 liters of oxygen via .  The patient has a pulse oximetry and verbalized her oxygen saturations are at baseline. She is doing well and happy to be  back home. Her son is currently there from [place] but leaves tomorrow to go back to [place].  . Assessed patient understanding of disease states.  Patient has a good understanding of chronic conditions. The patient lives alone. Her son has moved to [place]. She does have neighbors that check on her. 04-14-2020: The patient verbalized understanding of discharge instructions. She says she is weak and working on getting her strength up. Evaluation of the ability to be by herself once her son leaves. The patient is hopeful she will be okay. Education on safety and pacing activity. Home health aide was there earlier today but they only come in a few times a week. Education and support given.  . Assessed patient's education and care coordination needs.  Patient needs reliable transportation to her appointments. Evaluation of upcoming appointment for pcp on 05-05-2020.  The patient verbalized she has adequate transportation to her appointments. She sees her pcp and pulmonary provider that day. Her son will come in from [place] to take her.  . Provided disease specific education to patient. Education on call pcp for changes or worsening condition. [name] with appropriate clinical care team members regarding patient needs. Denies needs at this time from the LCSW or pharmacist. Endorses having medications and no other needs other than transportation at this time.   Patient Self Care Activities related to CAD, HTN, HLD, and COPD:  . Patient is unable to independently self-manage chronic health conditions  Please see past updates related to this goal by clicking on the "Past Updates" button in the selected goal         Patient verbalizes understanding of instructions provided today.   The care management team will reach out to the patient again over the next 30 to 60 days.   [name] RN, MSN, [facility] Mobile: [phone]

## 2020-04-14 NOTE — Chronic Care Management (AMB) (Signed)
Chronic Care Management   Follow Up Note   04/14/2020 Name: Carol Gay MRN: [id number] DOB: 06-03-1946  Referred by: Carol Hess, MD Reason for referral : Chronic Care Management (RNCM follow up call post hospitalization contact)   Carol Gay is a 74 y.o. year old female who is a primary care patient of Carol Melia Jesse Sans, MD. The CCM team was consulted for assistance with chronic disease management and care coordination needs.    Review of patient status, including review of consultants reports, relevant laboratory and other test results, and collaboration with appropriate care team members and the patient's provider was performed as part of comprehensive patient evaluation and provision of chronic care management services.    SDOH (Social Determinants of Health) assessments performed: Yes See Care Plan activities for detailed interventions related to [facility])     Outpatient Encounter Medications as of 04/14/2020  Medication Sig  . acetaminophen (TYLENOL) 500 MG tablet Take 2 tablets (1,000 mg total) by mouth 3 (three) times daily as needed. (Patient taking differently: Take by mouth 3 (three) times daily as needed. )  . albuterol (VENTOLIN HFA) 108 (90 Base) MCG/ACT inhaler INHALE 2 PUFFS INTO THE LUNGS EVERY 6 HOURS AS NEEDED FOR WHEEZING OR SHORTNESS OF BREATH  . amLODipine (NORVASC) 5 MG tablet TAKE 1 TABLET EVERY DAY (Patient taking differently: Take 5 mg by mouth at bedtime. )  . atorvastatin (LIPITOR) 20 MG tablet TAKE 1 TABLET AT BEDTIME  . azithromycin (ZITHROMAX Z-PAK) 250 MG tablet Take as directed  . calcium carbonate (TUMS - DOSED IN MG ELEMENTAL CALCIUM) 500 MG chewable tablet Chew 1 tablet (200 mg of elemental calcium total) by mouth 3 (three) times daily as needed for indigestion or heartburn.  . cyclobenzaprine (FLEXERIL) 10 MG tablet Take 1 tablet (10 mg total) by mouth 2 (two) times daily. (Patient taking differently: Take 10 mg by mouth 2 (two) times daily  as needed. )  . guaiFENesin-dextromethorphan (ROBITUSSIN DM) 100-10 MG/5ML syrup Take 5 mLs by mouth every 4 (four) hours as needed for up to 7 days for cough.  . hydrOXYzine (ATARAX/VISTARIL) 25 MG tablet Take 25 mg by mouth 2 (two) times daily as needed.   [place] Kitchen ipratropium (ATROVENT) 0.02 % nebulizer solution Take 2.5 mLs (0.5 mg total) by nebulization 4 (four) times daily.  Marland Kitchen ipratropium-albuterol (DUONEB) 0.5-2.5 (3) MG/3ML SOLN Take 3 mLs by nebulization 3 (three) times daily as needed.  . levalbuterol (XOPENEX) 0.63 MG/3ML nebulizer solution Take 3 mLs (0.63 mg total) by nebulization in the morning, at noon, in the evening, and at bedtime.  Marland Kitchen omeprazole (PRILOSEC) 40 MG capsule Take 40 mg by mouth daily.  . predniSONE (DELTASONE) 20 MG tablet Take 1 tablet (20 mg total) by mouth daily for 5 days.  . TRELEGY ELLIPTA 100-62.5-25 MCG/INH AEPB INHALE 1 PUFF INTO THE LUNGS DAILY.   Facility-Administered Encounter Medications as of 04/14/2020  Medication  . albuterol (PROVENTIL) (2.5 MG/3ML) 0.083% nebulizer solution 2.5 mg     Objective:  BP Readings from Last 3 Encounters:  04/13/20 (!) 150/75  03/03/20 132/70  02/03/20 (!) 118/55    Goals Addressed              This Visit's Progress   .  COMPLETED: RNCM: Pt-"I don't have transportation to get to my appointments" (pt-stated)        Carol Gay (see longitudinal plan of care for additional care plan information)  Current Barriers:  Currently has adequate transportation.  Marland Kitchen  Knowledge Deficits related to how to obtain adequate transportation to get to MD appointments  . Care Coordination needs related to transportation  in a patient with COPD, CAD, HTN, HLD (disease states) . Lacks caregiver support. Son lives in [place]. Neighbors help when can but are very busy  . Transportation barriers  Nurse Case Manager Clinical Goal(s):  Marland Kitchen Over the next 60 days, patient will verbalize understanding of plan for working with the care guide  team to get adequate transportation to her appointments  . Over the next 5 days, patient will work with [facility], Care guides and pcp to address needs related to lack of transportation to appointments  . Over the next 5 days, patient will work with care guides  (community agency) to get adequate transportation to the post discharge follow up visit at the pcp on 02/11/2020  Interventions:  . Inter-disciplinary care team collaboration (see longitudinal plan of care) . Advised patient to work with CCM team to get adequate transportation to MD appointments  . Provided education to patient re: care guide referral being placed to help with resources in [place] to help with transportation and other needs she may have  . Care Guide referral for immediate need for transportation to her post discharge appointment on 02/11/2020 with Dr. Army Melia.   Patient Self Care Activities:  . Patient verbalizes understanding of plan to work with the CCM team to meet transportation needs . Attends all scheduled provider appointments . Calls provider office for new concerns or questions . Unable to independently get to appointments due to inability to drive and not having adequate alternate transportation at this time.  Carol Gay social connections  Initial goal documentation     .  RNCM: Pt-"They had to increase my oxygen to 6 liters" (pt-stated)        CARE PLAN ENTRY (see longtitudinal plan of care for additional care plan information)  Current Barriers:  . Chronic Disease Management support, education, and care coordination needs related to CAD, HTN, HLD, and COPD . Lack of support system- son lives in [place] . Transportation barriers . NO blood pressure cuff to take blood pressure (has one ordered)  Clinical Goal(s) related to CAD, HTN, HLD, and COPD:  Over the next 120 days, patient will:  . Work with the care management team to address educational, disease management, and care coordination needs  . Begin  or continue self health monitoring activities as directed today Measure and record blood pressure 2 times per week and adhere to a heart healthy diet . Call provider office for new or worsened signs and symptoms Blood pressure findings outside established parameters, Oxygen saturation lower than established parameter, Shortness of breath, and New or worsened symptom related to HLD/CAD and other chronic conditions  . Call care management team with questions or concerns . Verbalize basic understanding of patient centered plan of care established today  Interventions related to CAD, HTN, HLD, and COPD:  . Evaluation of current treatment plans and patient's adherence to plan as established by provider.  Post discharge assessment: Inpatient 04-03-2020 to 04-13-2020 for COPD exacerbation with shortness of breath. The patient was using 6 liters of oxygen via [place] at home (increased to 11 liters high flow while in the hospital until taper back down to baseline), she was discharged home on 6 liters of oxygen via .  The patient has a pulse oximetry and verbalized her oxygen saturations are at baseline. She is doing well and happy to be back home.  Her son is currently there from [place] but leaves tomorrow to go back to [place].  . Assessed patient understanding of disease states.  Patient has a good understanding of chronic conditions. The patient lives alone. Her son has moved to [place]. She does have neighbors that check on her. 04-14-2020: The patient verbalized understanding of discharge instructions. She says she is weak and working on getting her strength up. Evaluation of the ability to be by herself once her son leaves. The patient is hopeful she will be okay. Education on safety and pacing activity. Home health aide was there earlier today but they only come in a few times a week. Education and support given.  . Assessed patient's education and care coordination needs.  Patient needs reliable transportation to her  appointments. Evaluation of upcoming appointment for pcp on 05-05-2020.  The patient verbalized she has adequate transportation to her appointments. She sees her pcp and pulmonary provider that day. Her son will come in from [place] to take her.  . Provided disease specific education to patient. Education on call pcp for changes or worsening condition. [name] with appropriate clinical care team members regarding patient needs. Denies needs at this time from the LCSW or pharmacist. Endorses having medications and no other needs other than transportation at this time.   Patient Self Care Activities related to CAD, HTN, HLD, and COPD:  . Patient is unable to independently self-manage chronic health conditions  Please see past updates related to this goal by clicking on the "Past Updates" button in the selected goal          Plan:   The care management team will reach out to the patient again over the next 30 to 60 days.    [name] RN, MSN, [facility] Mobile: [phone]

## 2020-04-14 NOTE — Telephone Encounter (Signed)
Tanzania from [employer] called in and state that the pt was just discharged from [facility] yesterday and is needing nursing for med management and PT to Eval and treat .   Best number [phone]

## 2020-04-15 ENCOUNTER — Telehealth: Payer: Self-pay | Admitting: Internal Medicine

## 2020-04-15 NOTE — Telephone Encounter (Signed)
Called and left VM for Lyn for Verbal orders. Also told her on VM that PCP will not be the attending. She would like the hospice attending to do so.  CM

## 2020-04-15 NOTE — Telephone Encounter (Signed)
Lyn with Hospice called in for verbal orders to evaluate and admit pt. She would also like to know if PCP would like to remain pt's attending provider?    CB: [phone]

## 2020-04-16 ENCOUNTER — Other Ambulatory Visit: Payer: Self-pay | Admitting: Internal Medicine

## 2020-04-17 ENCOUNTER — Other Ambulatory Visit: Payer: Self-pay

## 2020-04-17 LAB — BLOOD GAS, ARTERIAL
Acid-base deficit: 3.1 mmol/L — ABNORMAL HIGH (ref 0.0–2.0)
Bicarbonate: 21.2 mmol/L (ref 20.0–28.0)
FIO2: 0.7
O2 Saturation: 95.3 %
Patient temperature: 37
pCO2 arterial: 35 mmHg (ref 32.0–48.0)
pH, Arterial: 7.39 (ref 7.350–7.450)
pO2, Arterial: 78 mmHg — ABNORMAL LOW (ref 83.0–108.0)

## 2020-04-21 ENCOUNTER — Telehealth: Payer: Self-pay | Admitting: Internal Medicine

## 2020-04-21 NOTE — Telephone Encounter (Signed)
Received fax 04/21/2020. Dr. Army Melia will sign orders when she returns on 04/28/2020.  KP

## 2020-04-21 NOTE — Telephone Encounter (Unsigned)
Copied from [place] [id number]. Topic: General - Other >> Apr 21, 2020  2:22 PM Oneta Rack wrote: Osvaldo Human name:  Marcie  Relation to pt: [facility]  Call back number: [phone] ext [phone] fax # [phone]    Reason for call: PT Orders faxed on  03/26/2020 please note when received will re fax today to [phone]

## 2020-05-05 ENCOUNTER — Ambulatory Visit (INDEPENDENT_AMBULATORY_CARE_PROVIDER_SITE_OTHER): Payer: Medicare Other | Admitting: Internal Medicine

## 2020-05-05 ENCOUNTER — Other Ambulatory Visit: Payer: Self-pay

## 2020-05-05 ENCOUNTER — Encounter: Payer: Self-pay | Admitting: Internal Medicine

## 2020-05-05 VITALS — BP 134/66 | HR 97 | Temp 97.4°F | Ht 62.0 in

## 2020-05-05 DIAGNOSIS — J449 Chronic obstructive pulmonary disease, unspecified: Secondary | ICD-10-CM | POA: Diagnosis not present

## 2020-05-05 DIAGNOSIS — I10 Essential (primary) hypertension: Secondary | ICD-10-CM

## 2020-05-05 DIAGNOSIS — Z01818 Encounter for other preprocedural examination: Secondary | ICD-10-CM | POA: Diagnosis not present

## 2020-05-05 DIAGNOSIS — E559 Vitamin D deficiency, unspecified: Secondary | ICD-10-CM | POA: Diagnosis not present

## 2020-05-05 DIAGNOSIS — M25472 Effusion, left ankle: Secondary | ICD-10-CM

## 2020-05-05 DIAGNOSIS — R918 Other nonspecific abnormal finding of lung field: Secondary | ICD-10-CM | POA: Diagnosis not present

## 2020-05-05 DIAGNOSIS — R0902 Hypoxemia: Secondary | ICD-10-CM | POA: Diagnosis not present

## 2020-05-05 DIAGNOSIS — Z9981 Dependence on supplemental oxygen: Secondary | ICD-10-CM | POA: Diagnosis not present

## 2020-05-05 NOTE — Patient Instructions (Signed)
Vitamin D 2000 IU (50) take one every day

## 2020-05-05 NOTE — Progress Notes (Signed)
Date:  05/05/2020   Name:  Carol Gay   DOB:  08-30-1946   MRN:  [id number]   Chief Complaint: Hypertension (follow up ) and COPD  Hypertension This is a chronic problem. The problem is controlled. Associated symptoms include chest pain and shortness of breath. Pertinent negatives include no headaches or palpitations. Past treatments include calcium channel blockers. The current treatment provides significant improvement.  COPD She complains of shortness of breath. There is no wheezing. Associated symptoms include chest pain. Pertinent negatives include no fever or headaches. Her past medical history is significant for COPD.  Recently admitted with COPD exacerbation to [facility].  Discharged 04/13/20. Hospital Course:  Acute on chronic hypoxic respiratory failure AcuteCOPD exacerbation Community-acquired pneumonia in the right -On 6 L oxygen at home at baseline. -Presented with worsening shortness of breath. Required up to 11 L oxygen, gradually weaned down to home requirement. -Chest x-ray on admission showed worsening right-sided airspace opacification. -Patient probably had viral bronchitis versus atypical pneumonia -Treated with IV antibiotics with IV Ancef and azithromycin. -Initially started on IV Solu-Medrol, switched to oral prednisone currently on 40 mg daily.  -Pulmonary consultation appreciated.  As recommended, at discharge, I started the patient on azithromycin 250 mg daily for 5 days and prednisone 20 mg daily for 5 days.   -Continueaggressive bronchodilator therapy, chest physiotherapy with the breathing exercises and incentive spirometry.  -Also trial of Lasix given in the hospital.  Right lung upper lobe tumor -CT scan of the [place] admission showed2 cm irregular, spiculated nodule with central cavitation in the right upper lobe. -Seen by pulmonary. Patient will need further studies including MRI brain, outpatient PET scan. She will follow up with pulmonary  as an outpatient. -Seen by palliative care as well. Outpatient palliative follow-up recommended.  Lab Results  Component Value Date   CREATININE 0.79 04/11/2020   BUN 44 (H) 04/11/2020   NA 136 04/11/2020   K 4.5 04/11/2020   CL 91 (L) 04/11/2020   CO2 31 04/11/2020   Lab Results  Component Value Date   CHOL 131 02/02/2019   HDL 58 02/02/2019   LDLCALC 43 02/02/2019   TRIG 151 (H) 02/02/2019   CHOLHDL 2.3 02/02/2019   Lab Results  Component Value Date   TSH 4.960 (H) 02/02/2019   Lab Results  Component Value Date   HGBA1C 5.8 02/08/2016   Lab Results  Component Value Date   WBC 8.6 04/09/2020   HGB 14.1 04/09/2020   HCT 43.0 04/09/2020   MCV 91.1 04/09/2020   PLT 237 04/09/2020   Lab Results  Component Value Date   ALT 14 04/03/2020   AST 26 04/03/2020   ALKPHOS 84 04/03/2020   BILITOT 1.0 04/03/2020     Review of Systems  Constitutional: Negative for chills, fatigue, fever and unexpected weight change.  Respiratory: Positive for chest tightness and shortness of breath. Negative for wheezing.   Cardiovascular: Positive for chest pain and leg swelling. Negative for palpitations.  Gastrointestinal: Negative for abdominal pain.  Genitourinary: Negative for dysuria.  Musculoskeletal: Positive for arthralgias (knees) and gait problem.  Skin: Negative for color change and rash.  Neurological: Positive for weakness. Negative for dizziness, light-headedness and headaches.    Patient Active Problem List   Diagnosis Date Noted  . Pulmonary mass   . Pneumonia 04/03/2020  . Acute on chronic respiratory failure with hypoxia ([place]) 01/28/2020  . Anxiety 01/28/2020  . Dependence on continuous supplemental oxygen 12/28/2017  . Vitamin D deficiency  08/23/2017  . Age-related osteoporosis without current pathological fracture 08/22/2017  . Cyst of subcutaneous tissue 05/19/2017  . HPV (human papilloma virus) anogenital infection 07/07/2016  . Edema extremities  05/05/2016  . History of anal cancer 11/26/2015  . Fistula of intestine, excluding rectum and anus   . Pulmonary emphysema ([place]) 10/24/2015  . Encounter for long-term (current) use of medications 02/28/2015  . Aortic atherosclerosis ([place]) 12/30/2014  . CAD in native artery 12/30/2014  . DDD (degenerative disc disease), lumbar 12/30/2014  . Dyslipidemia 12/30/2014  . Essential (primary) hypertension 12/30/2014  . Dermatophytoses 12/30/2014  . Compulsive tobacco user syndrome 12/30/2014    Allergies  Allergen Reactions  . Bee Venom Anaphylaxis  . Aspirin Nausea And Vomiting  . Codeine Nausea And Vomiting  . Morphine And Related Nausea And Vomiting  . Cat Hair Extract Other (See Comments)    Runny nose, watery eyes    Past Surgical History:  Procedure Laterality Date  . APPENDECTOMY    . BREAST BIOPSY Left 1970's   neg  . COLONOSCOPY WITH PROPOFOL N/A 11/06/2015   Procedure: COLONOSCOPY WITH PROPOFOL;  Surgeon: Lucilla Lame, MD;  Location: [place];  Service: Endoscopy;  Laterality: N/A;  LEAVE PT EARLY  . EXPLORATORY LAPAROTOMY    . LESION EXCISION  11/03/15   Anal - Dr Dahlia Byes, [facility]  . LESION REMOVAL  1970's   from tailbone  . [place] SURGERY  1997  . OVARIAN CYST REMOVAL Left   . POLYPECTOMY  11/06/2015   Procedure: POLYPECTOMY;  Surgeon: Lucilla Lame, MD;  Location: [place];  Service: Endoscopy;;  . PORTACATH PLACEMENT N/A 12/10/2015   Procedure: INSERTION PORT-A-CATH;  Surgeon: Jules Husbands, MD;  Location: [facility];  Service: General;  Laterality: N/A;  . RECTAL EXAM UNDER ANESTHESIA N/A 06/23/2016   Procedure: RECTAL EXAM UNDER ANESTHESIA;  Surgeon: Jules Husbands, MD;  Location: [facility];  Service: General;  Laterality: N/A;  . VEIN LIGATION AND STRIPPING      Social History   Tobacco Use  . Smoking status: Former Smoker    Packs/day: 1.00    Years: 57.00    Pack years: 57.00    Types: Cigarettes    Quit date: 03/30/2016    Years since  quitting: 4.1  . Smokeless tobacco: Never Used  . Tobacco comment: Quit in July 2017  Vaping Use  . Vaping Use: Never used  Substance Use Topics  . Alcohol use: No    Alcohol/week: 0.0 standard drinks  . Drug use: No     Medication list has been reviewed and updated.  Current Meds  Medication Sig  . acetaminophen (TYLENOL) 500 MG tablet Take 2 tablets (1,000 mg total) by mouth 3 (three) times daily as needed. (Patient taking differently: Take by mouth 3 (three) times daily as needed. )  . albuterol (VENTOLIN HFA) 108 (90 Base) MCG/ACT inhaler INHALE 2 PUFFS INTO THE LUNGS EVERY 6 HOURS AS NEEDED FOR WHEEZING OR SHORTNESS OF BREATH  . amLODipine (NORVASC) 5 MG tablet TAKE 1 TABLET EVERY DAY  . atorvastatin (LIPITOR) 20 MG tablet TAKE 1 TABLET AT BEDTIME  . azithromycin (ZITHROMAX Z-PAK) 250 MG tablet Take as directed  . calcium carbonate (TUMS - DOSED IN MG ELEMENTAL CALCIUM) 500 MG chewable tablet Chew 1 tablet (200 mg of elemental calcium total) by mouth 3 (three) times daily as needed for indigestion or heartburn.  . cyclobenzaprine (FLEXERIL) 10 MG tablet Take 1 tablet (10 mg total) by  mouth 2 (two) times daily. (Patient taking differently: Take 10 mg by mouth 2 (two) times daily as needed. )  . hydrOXYzine (ATARAX/VISTARIL) 25 MG tablet Take 25 mg by mouth 2 (two) times daily as needed.   [place] Kitchen ipratropium (ATROVENT) 0.02 % nebulizer solution Take 2.5 mLs (0.5 mg total) by nebulization 4 (four) times daily.  Marland Kitchen ipratropium-albuterol (DUONEB) 0.5-2.5 (3) MG/3ML SOLN Take 3 mLs by nebulization 3 (three) times daily as needed.  . levalbuterol (XOPENEX) 0.63 MG/3ML nebulizer solution Take 3 mLs (0.63 mg total) by nebulization in the morning, at noon, in the evening, and at bedtime.  Marland Kitchen LORazepam (ATIVAN) 0.5 MG tablet   . omeprazole (PRILOSEC) 40 MG capsule Take 40 mg by mouth daily.  . ondansetron (ZOFRAN) 4 MG tablet   . TRELEGY ELLIPTA 100-62.5-25 MCG/INH AEPB INHALE 1 PUFF INTO THE LUNGS  DAILY.    PHQ 2/9 Scores 05/05/2020 06/27/2019 09/06/2018 03/31/2018  PHQ - 2 Score 0 2 1 1   PHQ- 9 Score 0 6 - -    GAD 7 : Generalized Anxiety Score 05/05/2020  Nervous, Anxious, on Edge 0  Control/stop worrying 0  Worry too much - different things 0  Trouble relaxing 1  Restless 0  Easily annoyed or irritable 1  Afraid - awful might happen 0  Total GAD 7 Score 2  Anxiety Difficulty Not difficult at all    BP Readings from Last 3 Encounters:  05/05/20 134/66  04/13/20 (!) 150/75  03/03/20 132/70    Physical Exam Constitutional:      Appearance: Normal appearance.  Cardiovascular:     Rate and Rhythm: Normal rate and regular rhythm.     Pulses: Normal pulses.     Comments: 1+ edema left ankle No calf tenderness or swelling Pulmonary:     Breath sounds: Decreased air movement present. Decreased breath sounds present. No wheezing or rhonchi.  Musculoskeletal:     Cervical back: Normal range of motion.  Lymphadenopathy:     Cervical: No cervical adenopathy.  Neurological:     General: No focal deficit present.     Mental Status: She is alert.  Psychiatric:        Attention and Perception: Attention normal.        Mood and Affect: Mood normal.     Wt Readings from Last 3 Encounters:  04/13/20 165 lb 1.6 oz (74.9 kg)  02/03/20 174 lb 9.7 oz (79.2 kg)  06/27/19 166 lb (75.3 kg)    BP 134/66   Pulse 97   Temp (!) 97.4 F (36.3 C) (Oral)   Ht 5\' 2"  (1.575 m)   SpO2 (!) 88%   BMI 30.20 kg/m   Assessment and Plan: 1. Essential (primary) hypertension Clinically stable exam with well controlled BP on amlodipine. Tolerating medications without side effects at this time. Pt to continue current regimen and low sodium diet; benefits of regular exercise as able discussed.  2. Vitamin D deficiency Very low levels noted in March - pt advised to begin supplements but did not Instructed to start 2000 IU daily  3. Pulmonary mass Suspicious for bronchogenic  carcinoma Has follow up today with Pulmonology  4. Edema of left ankle DDx: venous insufficiency and medication effect most likely Less likely to be DVT - she will discuss with pulmonology today at [facility] - can be sent for [place] then if needed   Partially dictated using [place]. Any errors are unintentional.  Halina Maidens, MD [facility]  05/05/2020

## 2020-05-07 ENCOUNTER — Other Ambulatory Visit: Payer: Self-pay | Admitting: Pulmonary Disease

## 2020-05-07 DIAGNOSIS — J449 Chronic obstructive pulmonary disease, unspecified: Secondary | ICD-10-CM

## 2020-06-02 ENCOUNTER — Telehealth: Payer: Self-pay

## 2020-06-02 ENCOUNTER — Ambulatory Visit: Admission: RE | Admit: 2020-06-02 | Payer: Medicare HMO | Source: Ambulatory Visit

## 2020-06-02 NOTE — Telephone Encounter (Signed)
Called and spoke with Carol Gay regarding transportation for upcoming PET. She is going to have her son come in from [place] for this appointment. She reports she has used our van in the past but she is now utilizing two oxygen tanks and a walker. She cannot get herself to the [place]. We will follow up after her PET to determine if she needs to be seen at the cancer center. If she does, we can see what we can do to assist with her medical transport.

## 2020-06-05 DIAGNOSIS — J449 Chronic obstructive pulmonary disease, unspecified: Secondary | ICD-10-CM | POA: Diagnosis not present

## 2020-06-09 ENCOUNTER — Ambulatory Visit: Payer: Medicare HMO | Admitting: Internal Medicine

## 2020-06-12 ENCOUNTER — Encounter: Admission: RE | Admit: 2020-06-12 | Payer: Medicare HMO | Source: Ambulatory Visit

## 2020-06-18 ENCOUNTER — Telehealth: Payer: Self-pay | Admitting: Internal Medicine

## 2020-06-19 NOTE — Telephone Encounter (Signed)
Just an FYI if this comes in this afternoon. Thanks!

## 2020-06-19 NOTE — Telephone Encounter (Signed)
I have not seen patient in nearly 2 years, please forward death certificate to PCP.  Thanks!

## 2020-06-20 NOTE — Telephone Encounter (Signed)
On 9/29-received a call from [place] with patient passed away; needing death certificate to be signed.  Thanks,GB

## 2020-06-20 DEATH — deceased

## 2020-06-30 ENCOUNTER — Ambulatory Visit: Payer: Medicare HMO
